# Patient Record
Sex: Male | Born: 1935 | Race: White | Hispanic: No | Smoking: Former smoker
Health system: Southern US, Community
[De-identification: ages and names within clinical notes are randomized; demographics above are authoritative.]

## PROBLEM LIST (undated history)

## (undated) DIAGNOSIS — E039 Hypothyroidism, unspecified: Secondary | ICD-10-CM

## (undated) DIAGNOSIS — I2510 Atherosclerotic heart disease of native coronary artery without angina pectoris: Secondary | ICD-10-CM

## (undated) DIAGNOSIS — I350 Nonrheumatic aortic (valve) stenosis: Secondary | ICD-10-CM

## (undated) DIAGNOSIS — E119 Type 2 diabetes mellitus without complications: Secondary | ICD-10-CM

## (undated) DIAGNOSIS — R21 Rash and other nonspecific skin eruption: Secondary | ICD-10-CM

## (undated) DIAGNOSIS — E785 Hyperlipidemia, unspecified: Secondary | ICD-10-CM

## (undated) DIAGNOSIS — E079 Disorder of thyroid, unspecified: Secondary | ICD-10-CM

## (undated) HISTORY — DX: Atherosclerotic heart disease of native coronary artery without angina pectoris: I25.10

## (undated) HISTORY — PX: EYE SURGERY: SHX253

## (undated) HISTORY — DX: Hyperlipidemia, unspecified: E78.5

## (undated) HISTORY — DX: Rash and other nonspecific skin eruption: R21

## (undated) HISTORY — PX: CARPAL TUNNEL RELEASE: SHX101

## (undated) HISTORY — DX: Disorder of thyroid, unspecified: E07.9

## (undated) HISTORY — PX: CARDIAC CATHETERIZATION: SHX172

## (undated) HISTORY — DX: Nonrheumatic aortic (valve) stenosis: I35.0

## (undated) HISTORY — PX: APPENDECTOMY: SHX54

## (undated) HISTORY — PX: TONSILLECTOMY: SUR1361

---
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
## 2001-07-29 ENCOUNTER — Ambulatory Visit (HOSPITAL_COMMUNITY): Admission: RE | Admit: 2001-07-29 | Discharge: 2001-07-29 | Payer: Self-pay | Admitting: Gastroenterology

## 2002-03-02 ENCOUNTER — Ambulatory Visit (HOSPITAL_BASED_OUTPATIENT_CLINIC_OR_DEPARTMENT_OTHER): Admission: RE | Admit: 2002-03-02 | Discharge: 2002-03-02 | Payer: Self-pay | Admitting: Orthopedic Surgery

## 2003-04-20 ENCOUNTER — Encounter: Admission: RE | Admit: 2003-04-20 | Discharge: 2003-07-19 | Payer: Self-pay | Admitting: Family Medicine

## 2006-04-02 ENCOUNTER — Inpatient Hospital Stay (HOSPITAL_COMMUNITY): Admission: EM | Admit: 2006-04-02 | Discharge: 2006-04-04 | Payer: Self-pay | Admitting: Emergency Medicine

## 2007-01-15 ENCOUNTER — Ambulatory Visit: Payer: Self-pay | Admitting: Vascular Surgery

## 2009-01-06 ENCOUNTER — Encounter: Admission: RE | Admit: 2009-01-06 | Discharge: 2009-01-06 | Payer: Self-pay | Admitting: Orthopedic Surgery

## 2009-01-06 ENCOUNTER — Encounter: Admission: RE | Admit: 2009-01-06 | Discharge: 2009-01-06 | Payer: Self-pay | Admitting: Family Medicine

## 2009-01-31 ENCOUNTER — Ambulatory Visit: Payer: Self-pay | Admitting: Vascular Surgery

## 2011-01-18 ENCOUNTER — Emergency Department (HOSPITAL_COMMUNITY): Payer: Medicare Other

## 2011-01-18 ENCOUNTER — Inpatient Hospital Stay (HOSPITAL_COMMUNITY)
Admission: EM | Admit: 2011-01-18 | Discharge: 2011-01-22 | DRG: 247 | Disposition: A | Discharge status: Home / Self Care | Payer: Medicare Other | Attending: Cardiovascular Disease | Admitting: Cardiovascular Disease

## 2011-01-18 DIAGNOSIS — I10 Essential (primary) hypertension: Secondary | ICD-10-CM | POA: Diagnosis present

## 2011-01-18 DIAGNOSIS — Z7902 Long term (current) use of antithrombotics/antiplatelets: Secondary | ICD-10-CM

## 2011-01-18 DIAGNOSIS — I252 Old myocardial infarction: Secondary | ICD-10-CM

## 2011-01-18 DIAGNOSIS — I200 Unstable angina: Secondary | ICD-10-CM | POA: Diagnosis present

## 2011-01-18 DIAGNOSIS — Z87891 Personal history of nicotine dependence: Secondary | ICD-10-CM

## 2011-01-18 DIAGNOSIS — E785 Hyperlipidemia, unspecified: Secondary | ICD-10-CM | POA: Diagnosis present

## 2011-01-18 DIAGNOSIS — I739 Peripheral vascular disease, unspecified: Secondary | ICD-10-CM | POA: Diagnosis present

## 2011-01-18 DIAGNOSIS — Z7982 Long term (current) use of aspirin: Secondary | ICD-10-CM

## 2011-01-18 DIAGNOSIS — I2510 Atherosclerotic heart disease of native coronary artery without angina pectoris: Principal | ICD-10-CM | POA: Diagnosis present

## 2011-01-18 LAB — DIFFERENTIAL
Basophils Absolute: 0 10*3/uL (ref 0.0–0.1)
Eosinophils Relative: 1 % (ref 0–5)
Lymphocytes Relative: 28 % (ref 12–46)
Lymphs Abs: 2 10*3/uL (ref 0.7–4.0)
Neutro Abs: 4.4 10*3/uL (ref 1.7–7.7)

## 2011-01-18 LAB — CBC
HCT: 42.6 % (ref 39.0–52.0)
Hemoglobin: 14.8 g/dL (ref 13.0–17.0)
MCV: 90.3 fL (ref 78.0–100.0)
RBC: 4.72 MIL/uL (ref 4.22–5.81)
RDW: 13 % (ref 11.5–15.5)
WBC: 7.3 10*3/uL (ref 4.0–10.5)

## 2011-01-18 LAB — POCT I-STAT, CHEM 8
Chloride: 107 mEq/L (ref 96–112)
Glucose, Bld: 118 mg/dL — ABNORMAL HIGH (ref 70–99)
HCT: 44 % (ref 39.0–52.0)
Potassium: 3.7 mEq/L (ref 3.5–5.1)
Sodium: 139 mEq/L (ref 135–145)

## 2011-01-18 LAB — POCT CARDIAC MARKERS
CKMB, poc: 2.5 ng/mL (ref 1.0–8.0)
Myoglobin, poc: 177 ng/mL (ref 12–200)
Troponin i, poc: <0.05 ng/mL (ref 0.00–0.09)

## 2011-01-18 LAB — PROTIME-INR
INR: 1 (ref 0.00–1.49)
Prothrombin Time: 13.4 seconds (ref 11.6–15.2)

## 2011-01-18 LAB — APTT: aPTT: 33 seconds (ref 24–37)

## 2011-01-19 DIAGNOSIS — I200 Unstable angina: Secondary | ICD-10-CM

## 2011-01-19 LAB — CBC
HCT: 43.7 % (ref 39.0–52.0)
Hemoglobin: 14.9 g/dL (ref 13.0–17.0)
MCH: 30.9 pg (ref 26.0–34.0)
MCV: 90.7 fL (ref 78.0–100.0)
RBC: 4.82 MIL/uL (ref 4.22–5.81)
WBC: 6.1 10*3/uL (ref 4.0–10.5)

## 2011-01-19 LAB — HEPARIN LEVEL (UNFRACTIONATED): Heparin Unfractionated: 0.26 IU/mL — ABNORMAL LOW (ref 0.30–0.70)

## 2011-01-19 LAB — CARDIAC PANEL(CRET KIN+CKTOT+MB+TROPI)
CK, MB: 4.8 ng/mL — ABNORMAL HIGH (ref 0.3–4.0)
CK, MB: 3.6 ng/mL (ref 0.3–4.0)
Total CK: 77 U/L (ref 7–232)
Troponin I: 0.02 ng/mL (ref 0.00–0.06)

## 2011-01-19 LAB — LIPID PANEL
LDL Cholesterol: 77 mg/dL (ref 0–99)
Total CHOL/HDL Ratio: 3.8 RATIO
Triglycerides: 81 mg/dL (ref <150)
VLDL: 16 mg/dL (ref 0–40)

## 2011-01-19 LAB — COMPREHENSIVE METABOLIC PANEL
AST: 19 U/L (ref 0–37)
Albumin: 3.8 g/dL (ref 3.5–5.2)
Alkaline Phosphatase: 44 U/L (ref 39–117)
BUN: 19 mg/dL (ref 6–23)
Chloride: 107 mEq/L (ref 96–112)
GFR calc Af Amer: 48 mL/min — ABNORMAL LOW (ref >60)
Potassium: 3.8 mEq/L (ref 3.5–5.1)
Total Bilirubin: 0.5 mg/dL (ref 0.3–1.2)
Total Protein: 6.3 g/dL (ref 6.0–8.3)

## 2011-01-19 LAB — HEMOGLOBIN A1C: Hgb A1c MFr Bld: 5.8 % — ABNORMAL HIGH (ref <5.7)

## 2011-01-20 LAB — CBC
HCT: 45 % (ref 39.0–52.0)
Hemoglobin: 15.5 g/dL (ref 13.0–17.0)
RBC: 4.92 MIL/uL (ref 4.22–5.81)

## 2011-01-20 LAB — HEPARIN LEVEL (UNFRACTIONATED): Heparin Unfractionated: 0.71 IU/mL — ABNORMAL HIGH (ref 0.30–0.70)

## 2011-01-21 DIAGNOSIS — I2510 Atherosclerotic heart disease of native coronary artery without angina pectoris: Secondary | ICD-10-CM

## 2011-01-21 LAB — CBC
HCT: 44 % (ref 39.0–52.0)
Hemoglobin: 15.3 g/dL (ref 13.0–17.0)
MCV: 92.2 fL (ref 78.0–100.0)
RBC: 4.77 MIL/uL (ref 4.22–5.81)
WBC: 6.5 10*3/uL (ref 4.0–10.5)

## 2011-01-21 LAB — BASIC METABOLIC PANEL
CO2: 22 mEq/L (ref 19–32)
Calcium: 9 mg/dL (ref 8.4–10.5)
Chloride: 110 mEq/L (ref 96–112)
GFR calc Af Amer: 57 mL/min — ABNORMAL LOW (ref >60)
Glucose, Bld: 100 mg/dL — ABNORMAL HIGH (ref 70–99)
Potassium: 4 mEq/L (ref 3.5–5.1)
Sodium: 137 mEq/L (ref 135–145)

## 2011-01-21 LAB — POCT ACTIVATED CLOTTING TIME: Activated Clotting Time: 505 seconds

## 2011-01-21 LAB — GLUCOSE, CAPILLARY: Glucose-Capillary: 82 mg/dL (ref 70–99)

## 2011-01-22 LAB — BASIC METABOLIC PANEL
CO2: 25 mEq/L (ref 19–32)
Calcium: 9.1 mg/dL (ref 8.4–10.5)
Chloride: 109 mEq/L (ref 96–112)
Creatinine, Ser: 1.55 mg/dL — ABNORMAL HIGH (ref 0.4–1.5)
Glucose, Bld: 103 mg/dL — ABNORMAL HIGH (ref 70–99)

## 2011-01-22 LAB — CBC
HCT: 43.2 % (ref 39.0–52.0)
MCH: 32.3 pg (ref 26.0–34.0)
MCHC: 35.4 g/dL (ref 30.0–36.0)
MCV: 91.1 fL (ref 78.0–100.0)
RDW: 13.1 % (ref 11.5–15.5)

## 2011-01-22 NOTE — Procedures (Signed)
[name], [name]                ACCOUNT NO.:  [id number]  MEDICAL RECORD NO.:  [id number]           PATIENT TYPE:  I  LOCATION:  6533                         FACILITY:  [facility]  PHYSICIAN:  Verne Carrow, MDDATE OF BIRTH:  1930-09-13  DATE OF PROCEDURE:  01/21/2011 DATE OF DISCHARGE:                           CARDIAC CATHETERIZATION   PRIMARY CARDIOLOGIST:  [name], MD  PRIMARY CARE PHYSICIAN:  [name], MD  PROCEDURE PERFORMED:  PTCA with placement of two overlapping drug- eluting stents in the proximal and mid right coronary artery.  OPERATOR:  Verne Carrow, MD  INDICATION:  This is a 77 year old Caucasian male with a history of previous tobacco use, known coronary artery disease, peripheral vascular disease, and hyperlipidemia who was admitted to the hospital with complaints of chest discomfort.  His diagnostic catheterization was performed by Dr. [name], earlier today and the patient was found to have two severe stenoses in the proximal and mid right coronary artery.  I reviewed the films with Dr. Elease Hashimoto and we agreed to proceed to coronary intervention.  PROCEDURE IN DETAIL:  When I entered the case, the patient was lying supine on the cath table.  There was a 6-French sheath present in the right femoral artery.  The patient was given a bolus of Angiomax and drip was started.  The patient was given an additional 300 mg of Plavix on the cath table.  He does use Plavix on a daily basis.  When the ACT was greater than 200, I selectively engaged the native right coronary artery with a 6-French JR-4 guiding catheter with side holes.  A Cougar intracoronary wire was easily advanced down the right coronary artery. A 2.5 x 12-mm balloon was positioned in the area of tightest stenosis in the mid vessel and deployed without difficulty.  Despite the fact there was some calcification present, the balloon expanded well.  We then attempted to pass  a stent through this area and the mid vessel, however, the stent would not pass the proximal lesion.  A 2.5 x 12-mm balloon was positioned inside the proximal lesion and was inflated without difficulty.  There was once again good expansion of the balloon.  There was diffuse calcification throughout the entire proximal and mid vessel. At this point, we were still unable to pass the stent to the mid vessel. I placed a second Cougar wire into the distal right coronary artery. After much difficulty, we decided to once again dilate the area with a larger balloon.  A 3.0 x 20-mm noncompliant balloon was inflated twice in mid and proximal segment.  At this time, we were able to pass a 3.5 x 16-mm Promus Element drug-eluting stent to the mid vessel.  This was deployed without difficulty with good expansion.  A 3.5 x 16-mm Promus Element drug-eluting stent was then advanced over the wire into the proximal portion of the mid segment in an overlapping fashion with the first stent and was deployed without difficulty.  A 3.75 x 20-mm noncompliant balloon was positioned inside the stented segment and inflated twice in overlapping segments up to 16 atmospheres.  There was  a good balloon expansion.  The stenosis was taken from 90% in both the mid vessel and the proximal portion of the mid vessel.  Final stenosis was 0%.  There was excellent flow into the distal vessel.  There was some haziness along the edges of the stent secondary to the calcification that was present.  There were no immediate complications. The patient was taken to the recovery room in stable condition.  IMPRESSION:  Successful percutaneous transluminal coronary angioplasty with placement of overlapping drug-eluting stents in the mid right coronary artery.  RECOMMENDATIONS:  The patient will be continued on aspirin and Plavix for at least 1 year.  We will also continue his beta-blocker and statin.     Verne Carrow,  MD     CM/MEDQ  D:  01/21/2011  T:  01/22/2011  Job:  [id number]  cc:   [name], M.D. [name], M.D.  Electronically Signed by Verne Carrow MD on 01/22/2011 10:09:32 AM

## 2011-01-30 NOTE — Discharge Summary (Signed)
[name], [name]                ACCOUNT NO.:  [id number]  MEDICAL RECORD NO.:  [id number]           PATIENT TYPE:  I  LOCATION:  6533                         FACILITY:  [facility]  PHYSICIAN:  Verne Carrow, MDDATE OF BIRTH:  [birth year]  DATE OF ADMISSION:  01/18/2011 DATE OF DISCHARGE:  01/22/2011                              DISCHARGE SUMMARY   PRIMARY CARDIOLOGIST:  [name], MD  PRIMARY CARE PROVIDER:  [name], MD  DISCHARGE DIAGNOSES: 1. Coronary artery disease, status post percutaneous transluminal     coronary angioplasty and placement of 2 overlapping drug-eluting     stents in the proximal and mid right coronary artery on January 21, 2011.     a.     Status post inferolateral myocardial infarction diagnosed by      Cardiolite in 2007.     b.     Cardiac catheterization, February 01, 2006:  With a totally      occluded ramus intermediate with left to left collaterals.  There      was diffuse coronary artery disease involving the right coronary      artery.  Medical therapy was recommended.  Ejection fraction of      55%. 2. Hypertension, Norvasc added this admission. 3. Hyperlipidemia/hypertriglyceridemia.  SECONDARY DIAGNOSES: 1. Peripheral vascular disease/carotid artery disease with     approximately 50% right internal carotid artery stenosis, evaluated     by Dr. Hart Rochester. 2. Questionable history of transient ischemic attacks. 3. Remote tobacco use, quit in 1968.  ALLERGIES:  No known drug allergies.  PROCEDURE/DIAGNOSTICS PERFORMED DURING HOSPITALIZATION: 1. Left heart catheterization with coronary angiography.     a.     Left anterior descending artery with 40% stenosis at its      origin.  Mild to moderate disease elsewhere.  Left circumflex      artery with a 40-50% mid stenosis.  First obtuse marginal artery      with moderate stenosis.  Ramus intermediate vessel with moderate      to severe disease proximally and occluded distally.  It  fills via      left-to-left collaterals.  Unchanged from prior cath in 2007.      Right coronary artery with moderate calcification.  Proximal 30%      eccentric stenosis.  Mid vessel with 70-80% stenosis (possibly 80-      90% depending on the view).  Posterior descending artery and      posterolateral segment with minor luminal irregularities.  Normal      left ventricular systolic function.     b.     Status post percutaneous transluminal coronary angioplasty      with placement of 2 Promus Element drug-eluting stents in the      proximal mid right coronary artery.     c.     A 3.5 x 16 mm Promus Element stent in the mid right coronary      artery.     d.     A 3.5 x 16 Promus Element drug-eluting stent in the proximal  right coronary artery.     e.     Both lesions, stenosis taking from 90% to 0% stenosis. 2. Chest x-ray on January 18, 2011:  No active cardiopulmonary disease.  REASON FOR HOSPITALIZATION:  This is a 77 year old gentleman with history of coronary artery disease who has been doing well on medical therapy prior to admission on January 18, 2011.  At this time, the patient was preparing for yard work when he noted substernal chest heaviness without associated symptoms.  After 30 minutes of rest, the pain relieved.  The patient was back outside and had recurrence of his symptoms and therefore took a nitroglycerin and he had no relief.  His wife called EMS and the patient chewed a baby aspirin and his pain has relieved by the time EMS had arrived.  In the emergency department, the patient was pain free.  His EKG showed no acute changes.  Point-of-care markers were negative x1.  The patient was admitted to the Cardiology Service for further evaluation.  HOSPITAL COURSE:  The patient was admitted to telemetry and cardiac enzymes were cycled.  The patient's troponin remained within normal limits, and his CK-MB did elevate to 7.  The patient was continued on aspirin, Plavix  and heparin and statin were added.  No beta-blocker was used secondary to bradycardia at baseline.  The patient's initial labs also showed a creatinine elevated at 1.9 and therefore the patient was hydrated and kept through the weekend for planned cardiac catheterization on Monday morning.  The patient had no further complaints of chest pain during hospitalization.  He was noted to be bradycardic with heart rate falling to 39, but this was nonsustained, the patient was asymptomatic.  After hydration, the patient's creatinine was 1.45 and therefore he was able to proceed with cardiac catheterization on January 21, 2011.  The patient was brought to the Cardiac Cath Lab by Dr. Elease Hashimoto, informed consent was obtained.  As above, there was notable stenosis in the right coronary artery, the mid vessel had 70-80% stenosis and possibly 80-90% depending on the view, therefore the case was discussed with Dr. Clifton James and he agreed to pursue with PCI of the right coronary artery.  Dr. Clifton James been completed PTCA and placement of 2 Promus overlapping drug-eluting stents in the proximal and mid right coronary artery.  The patient tolerated the procedure well and was taken for overnight observation.  It was noted the patient should to be continued on aspirin and Plavix for at least 1 year.  He will also be continued on a statin.  No beta-blocker at this time secondary to bradycardia.  The patient tolerated the catheterization well and his right groin was without signs of hematoma. He did ambulate with cardiac rehab.  He was able to complete 400 feet with steady gait and no chest pain.  The patient declined outpatient cardiac rehab at this time and states he will continue to exercise on his own, he does do split squats approximately 3 times a week.  Of note, the patient was noted to be hypertensive with systolic pressures of 155, therefore he was started on Norvasc 5 mg daily, this will be continued as an  outpatient.  On the day of discharge, Dr. Clifton James any evaluated the patient and noted him stable for home.  Again, he was able to ambulate without difficulty and had no further complaints of chest pain or shortness of breath.  The patient's creatinine was monitored and was 1.55 on the day of discharge, this  is postcatheterization and hydration.  DISCHARGE LABORATORY DATA:  WBC 8.1, hemoglobin 15.3, hematocrit 43.2, and platelet 215.  Sodium 139, potassium 4.3, BUN 14, and creatinine 1.55.  Hemoglobin A1c 5.8, cholesterol 126, triglycerides 81, HDL 33, and LDL 77.  TSH 3.036.  DISCHARGE MEDICATIONS: 1. Norvasc 5 mg daily. 2. Nitroglycerin 0.4 mg sublingual 1 tablet under tongue after onset     chest pain and may repeat after 5 minutes for 3 doses. 3. Pravastatin 40 mg daily. 4. Aspirin 81 mg daily. 5. Fenofibrate 145 mg daily. 6. Fish oil 1000 mg twice daily. 7. Glucosamine over-the-counter daily. 8. Plavix 75 mg daily. 9. Synthroid 25 mcg daily. 10.Vitamin B1 over-the-counter 1 tablet daily.  FOLLOWUP PLANS AND INSTRUCTIONS: 1. The patient will follow up with Dr. Elease Hashimoto on February 12, 2011, at 9     a.m. 2. The patient is to increase activity slowly.  He may shower but no     bathing.  No lifting for 1 week greater than 5 pounds.  No driving     for 2 days.  No sexual activity for 1 week.  He is to keep his cath     site clean and dry and call the office for any problems. 3. The patient is to avoid any straining.  He is to stop any     activities that causes chest pain or shortness of breath. 4. The patient is to continue a low-sodium, heart-healthy diet. 5. The patient is to call the office in the interim for any problems     or concerns.     Leonette Monarch, PA-C   ______________________________ Verne Carrow, MD    NB/MEDQ  D:  01/22/2011  T:  01/23/2011  Job:  [id number]  cc:   [name], M.D. [name], M.D.  Electronically Signed by Alen Blew P.A. on 01/29/2011 01:20:05 PM Electronically Signed by Verne Carrow MD on 01/30/2011 08:37:02 AM

## 2011-02-11 ENCOUNTER — Encounter: Payer: Self-pay | Admitting: *Deleted

## 2011-02-12 ENCOUNTER — Ambulatory Visit (INDEPENDENT_AMBULATORY_CARE_PROVIDER_SITE_OTHER): Payer: Medicare Other | Admitting: Cardiovascular Disease

## 2011-02-12 ENCOUNTER — Encounter: Payer: Self-pay | Admitting: Cardiovascular Disease

## 2011-02-12 VITALS — BP 158/80 | HR 56 | Ht 68.0 in | Wt 175.8 lb

## 2011-02-12 DIAGNOSIS — I10 Essential (primary) hypertension: Secondary | ICD-10-CM | POA: Insufficient documentation

## 2011-02-12 DIAGNOSIS — I2510 Atherosclerotic heart disease of native coronary artery without angina pectoris: Secondary | ICD-10-CM | POA: Insufficient documentation

## 2011-02-12 DIAGNOSIS — E785 Hyperlipidemia, unspecified: Secondary | ICD-10-CM

## 2011-02-12 NOTE — Assessment & Plan Note (Signed)
[name] is doing very well. He has not had episodes of chest pain. He's tolerating his meds and without complications. He will continue with his same medications and I'll see him again in 6 months.

## 2011-02-12 NOTE — Procedures (Signed)
  NAMEVAYDEN, [name]                ACCOUNT NO.:  [id number]  MEDICAL RECORD NO.:  [id number]           PATIENT TYPE:  I  LOCATION:  6533                         FACILITY:  [facility]  PHYSICIAN:  [name], M.D. DATE OF BIRTH:  1930/09/01  DATE OF PROCEDURE:  01/21/2011 DATE OF DISCHARGE:                           CARDIAC CATHETERIZATION   [name] is a 77 year old gentleman with a history of coronary artery disease in the past.  He has done fairly well on medical therapy since being diagnosed with coronary artery disease.  He had episodes of chest pain last Friday and was admitted to the hospital.  He is ruled out for myocardial infarction.  He is referred for heart catheterization for further evaluation of his chest pain today.  The procedure was left heart catheterization with coronary angiography. The right femoral artery was easily cannulated using the modified Seldinger technique.  HEMODYNAMICS:  LV pressure is 142/18.  His aortic pressure is 140/56.  ANGIOGRAPHY:  Left main:  The left main is moderately calcified.  There is a distal stenosis of 20-30%.  The left anterior descending artery:  The LAD has a 40% stenosis at its origin.  It is moderately calcified.  There is mild-to-moderate disease elsewhere.  There was a large diagonal branch, which has mild-to- moderate irregularities.  The left circumflex artery:  The left circumflex artery has minor luminal irregularities and then a 40-50% mid stenosis.  First obtuse marginal artery has moderate stenosis.  The ramus intermediate vessel has moderate to severe disease proximally and is then occluded distally.  It fills via left-to-left collaterals. This lesion is unchanged from his previous cath in 2007.  The right coronary artery:  The right coronary artery is moderately calcified throughout its course.  There is a proximal 30% eccentric stenosis.  The mid vessel has a 70-80% stenosis.  In fact, it could  be 80-90% depending on the view.  The distal right coronary artery has minor luminal irregularities.  The posterior descending artery and the posterior lateral segment artery have minor luminal irregularities.  The left ventriculogram was performed in the 30 RAO position.  This reveals normal left ventricular systolic function.  I have discussed the case with Dr. Clifton James.  We will proceed with PCI of the right coronary artery.     [name], M.D.     PJN/MEDQ  D:  01/21/2011  T:  01/22/2011  Job:  [id number]  cc:   [name], M.D.  Electronically Signed by [name] M.D. on 02/12/2011 09:08:17 AM

## 2011-02-12 NOTE — Assessment & Plan Note (Signed)
His blood pressure is well controlled. He'll continue on the amlodipine.

## 2011-02-12 NOTE — Progress Notes (Signed)
History of Present Illness:  [name] is an elderly gentleman with a history of coronary artery disease. He status post recent hospitalization for angina. He had PTCA and stenting of his mid right coronary artery. He has a known occlusion of a small ramus intermediate branch.  He's done well. He's not had any episodes of chest pain or shortness of breath.  Current Outpatient Prescriptions on File Prior to Visit  Medication Sig Dispense Refill  . aspirin 81 MG tablet Take 81 mg by mouth daily.        . clopidogrel (PLAVIX) 75 MG tablet Take 75 mg by mouth daily.        . fenofibrate (TRICOR) 145 MG tablet Take 145 mg by mouth daily.        . fish oil-omega-3 fatty acids 1000 MG capsule Take 2 g by mouth daily.        Marland Kitchen glucosamine-chondroitin 500-400 MG tablet Take 1 tablet by mouth 2 (two) times daily.        Marland Kitchen levothyroxine (SYNTHROID, LEVOTHROID) 50 MCG tablet Take 50 mcg by mouth daily.        . Multiple Vitamin (MULTIVITAMIN) tablet Take 1 tablet by mouth daily.        . niacin 500 MG tablet Take 500 mg by mouth daily.       . pravastatin (PRAVACHOL) 40 MG tablet Take 40 mg by mouth daily.        . sildenafil (VIAGRA) 25 MG tablet Take 25 mg by mouth daily as needed.        . Flaxseed, Linseed, (FLAX SEED OIL PO) Take by mouth.        Amlodipine 5 mg a day  No Known Allergies  Past Medical History  Diagnosis Date  . Coronary artery disease   . Hyperlipidemia   . Thyroid disease     hypothyroidism    Past Surgical History  Procedure Date  . Cardiac catheterization     2007 occluded ramus inter.   . Carpal tunnel release   . Appendectomy     History  Smoking status  . Former Smoker  . Quit date: 11/04/1960  Smokeless tobacco  . Not on file    History  Alcohol Use No    Family History  Problem Relation Age of Onset  . Hypertension Mother   . Hypertension Father     Reviw of Systems:  The patient denies any heat or cold intolerance.  No weight gain or weight  loss.  The patient denies headaches or blurry vision.  There is no cough or sputum production.  The patient denies dizziness.  There is no hematuria or hematochezia.  The patient denies any muscle aches or arthritis.  The patient denies any rash.  The patient denies frequent falling or instability.  There is no history of depression or anxiety.  All other systems were reviewed and are negative.  Physical Exam: BP 158/80  Pulse 56  Ht 5\' 8"  (1.727 m)  Wt 175 lb 12.8 oz (79.742 kg)  BMI 26.73 kg/m2 The patient is alert and oriented x 3.  The mood and affect are normal.  The skin is warm and dry.  Color is normal.  The HEENT exam reveals that the sclera are nonicteric.  The mucous membranes are moist.  The carotids are 2+ without bruits.  There is no thyromegaly.  There is no JVD.  The lungs are clear.  The chest wall is non tender.  The heart  exam reveals a regular rate with a normal S1 and S2.  There are no murmurs, gallops, or rubs.  The PMI is not displaced.   Abdominal exam reveals good bowel sounds.  There is no guarding or rebound.  There is no hepatosplenomegaly or tenderness.  There are no masses.  Exam of the legs reveal no clubbing, cyanosis, or edema.  The legs are without rashes.  The distal pulses are intact.  Cranial nerves II - XII are intact.  Motor and sensory functions are intact.  The gait is normal.  Assessment / Plan:

## 2011-03-01 NOTE — H&P (Signed)
[name], [name]                ACCOUNT NO.:  [id number]  MEDICAL RECORD NO.:  [id number]           PATIENT TYPE:  I  LOCATION:  3711                         FACILITY:  [facility]  PHYSICIAN:  Gregory Rotunda, MD, FACCDATE OF BIRTH:  1929-11-23  DATE OF ADMISSION:  01/18/2011 DATE OF DISCHARGE:                             HISTORY & PHYSICAL   PRIMARY CARDIOLOGIST:  Gregory Mixer, MD  PRIMARY CARE PROVIDER:  [name], MD  PATIENT PROFILE:  [name] with prior history of CAD who presents with unstable angina.  PROBLEMS: 1. Unstable angina/CAD.     a.     Status post inferolateral MI diagnosed by Cardiolite in      2007.     b.     Apr 03, 2006, cardiac catheterization, left main was a 2.5      mm vessel.  LAD had 10-20% luminal irregularities.  First diagonal      had 20% ostial stenosis and 50% mid stenosis.  Ramus intermedius      was a 2 mm vessel and was totally occluded in the midsection of      the artery with left-to-left collaterals.     c.     Right coronary artery had a 50% proximal stenosis and 60-70%      stenosis in the mid vessel.  EF was 55% with lateral akinesis.      Medical therapy was recommended. 2. Peripheral vascular disease/carotid arterial disease with     approximately 50% right internal carotid artery stenosis,     previously seen by Dr. Hart Daugherty. 3. Questionable history of TIAs. 4. Hyperlipidemia/hypertriglyceridemia. 5. Remote tobacco use, quitting in 1968.  ALLERGIES:  No known drug allergies.  HISTORY OF PRESENT ILLNESS:  [name] with history of inferolateral MI diagnosed by Cardiolite, following an episode of dyspnea in 2007.  He subsequently underwent a cardiac catheterization revealing a totally occluded ramus intermedius, which was a small vessel with left-to-left collaterals.  He also had moderate stenosis in the RCA.  Medical therapy was recommended.  Over the past 5 years, the patient has done exceptionally  well.  He does a spin class for an hour 3 times per week without any current symptoms or limitations.  He actually did 1-day classes this morning.  The patient then ran some errands with his wife and when he returned home, he was getting ready to use a weed whacker when he noted 4/10 substernal chest heaviness without associated symptoms.  He went back inside and sat down for about 30 minutes with some relief and then went back outside to give another shot, but then he had recurrent symptoms, prompting him to come back inside and rest.  At that point, as he took a nitroglycerin which he says was at least a year old and he had no relief.  His wife called EMS and they were advised over the phone to chew a baby aspirin.  He did this with complete relief of his symptoms prior to EMS arrival.  He was then taken to the ED for evaluation.  He is currently painfree.  ECG shows no acute changes. Point-of-care markers are negative.  Total duration of symptoms was about 45 minutes to an hour.  HOLD MEDICATIONS:  Fish oil 2 tablets daily, niacin 500 mg nightly, glucosamine 2 tablets daily, aspirin 81 mg daily, Plavix 75 mg daily, Synthroid 50 mcg daily, TriCor 145 mg daily, multivitamin daily, Pravachol 40 mg daily.  FAMILY HISTORY:  Mother died at 48 of old age.  Father died at 52 with a history coronary artery disease, stroke, and diabetes.  Sister died with a history of diabetes and coronary artery disease.  SOCIAL HISTORY:  The patient lives in [place] with his wife.  He is retired from [employer].  He previously smoked but quit in 1968. Denies alcohol or drugs.  He is exercising at least 3 times a week for an hour fairly heavily exertional activities without symptoms or limitations.  REVIEW OF SYSTEMS:  Positive for chest heaviness as outlined above. Otherwise all systems reviewed and negative.  He is a full code.  PHYSICAL EXAMINATION:  VITAL SIGNS:  He is afebrile, heart rate is  52, respirations 16, blood pressure is 132/78, pulse ox 98% on 2 L. GENERAL:  A pleasant white male in no acute distress.  Awake, alert, and oriented x3.  He has a normal affect. HEENT:  Normal. NEURO:  Grossly intact and nonfocal. SKIN:  Warm and dry without lesions or Masses. NECK:  Supple without bruits or JVD. LUNGS:  Respirations are regular and unlabored.  Clear to auscultation. CARDIAC:  Regular S1 and S2.  No S3, S4, or murmurs. ABDOMEN:  Round, soft, and nontender.  Bowel sounds present x4. EXTREMITIES:  Warm, dry, and pink.  No clubbing, cyanosis, or edema. Dorsalis pedis and posterior tibial pulses 2+ and equal bilaterally.  Chest x-ray is pending.  EKG shows sinus bradycardia, rate of 53, normal axis, right bundle-branch block.  No acute ST-T changes.  Hemoglobin 15.0, hematocrit 44.0, WBC 7.3, platelets 225.  Sodium 139, potassium 3.7, chloride 107, BUN 22, creatinine 1.9, glucose 118.  CK-MB 2.9, troponin I less than 0.05.  ASSESSMENT AND PLAN: 1. Unstable angina.  The patient presents with symptoms concerning for     angina.  Plan to admit and cycle enzymes.  Continue aspirin, add     heparin, and continue Plavix and statin.  Hold off on beta-blocker     with baseline bradycardia.  The patient's creatinine is elevated by     point-of-care at 1.9.  This was previously 1.3 ini May 2007.  We do     not have records with regards to status in between then and now.     We will hydrate today and plan for cardiac catheterization on     Monday.  The patient is agreeable. 2. Hyperlipidemia.  Check lipids and LFTs.  Continue statin therapy.     [name], ANP   ______________________________ Gregory Rotunda, MD, [facility]    CB/MEDQ  D:  01/18/2011  T:  01/19/2011  Job:  [id number]  Electronically Signed by [name] ANP on 01/21/2011 12:08:32 PM Electronically Signed by Gregory Rotunda MD [facility] on 03/01/2011 11:42:44 AM

## 2011-03-19 NOTE — Consult Note (Signed)
NEW PATIENT CONSULTATION   [name], [name]  DOB:  12/29/1929                                       01/31/2009  IONGE#:[id number]   The patient is a 77 year old male patient referred by Dr. [name]  for vascular evaluation of his carotid occlusive disease.  This patient  over the last few weeks has had some episodes of tingling of the left  side of his face and the left neck area as well as the left lateral  chest wall.  He has had no weakness or paralysis.  No visual symptoms,  no dizziness, no syncope and no garbled speech.  He never had these  symptoms previously and has not had any symptoms currently.  He has no  history of stroke or amaurosis fugax.   PAST MEDICAL HISTORY:  1. Non-insulin-dependent diabetes mellitus.  2. Coronary artery disease with previous myocardial infarction 2007      with cardiac cath performed, treated medically.  3. Negative for hypertension, hyperlipidemia, congestive heart      failure, COPD or stroke.   PAST SURGICAL HISTORY:  1. Appendectomy.  2. Surgical procedure on the right hand.   FAMILY HISTORY:  Is positive for stroke in his father, coronary artery  disease in his father and sister and diabetes in his father.   SOCIAL HISTORY:  He is married and has four children, retired  Advice worker.  Does not smoke cigarettes since 1968 and  does not use alcohol.   REVIEW OF SYSTEMS:  Very rarely or occasionally will have some slight  tightness in his chest.  He is very active and does not have dyspnea on  exertion.  He has urinary frequency, joint pain but no claudication or  neurologic symptoms.   ALLERGIES:  None known.   MEDICATIONS:  Please see health history form.  He is on Plavix and  aspirin.   PHYSICAL EXAM:  Blood pressure is 128/64, heart rate 64, respirations  14.  Generally he is a healthy-appearing male in no apparent stress,  alert and oriented x3.  Neck is supple, 3+ carotid pulses  palpable.  No  bruits are audible.  Neurologic exam is normal.  No palpable adenopathy  in the neck.  Chest clear to auscultation.  Cardiovascular exam reveals  a regular rhythm, no murmurs.  Abdomen soft, nontender with no masses.  He has 3+ femoral and popliteal pulses bilaterally with well-perfused  lower extremities.   Carotid duplex exam performed at [facility] on March 5 revealed what  appears to be mild carotid occlusive disease bilaterally right worse  than the left.  Stenosis on the right appears to be about 50%, left  being less.   I do not think his carotid disease is causing the symptomatology which  he has experienced.  I am not certain these are TIAs.  He will watch  these symptoms closely and if he develops more discrete motor symptoms  or visual symptoms he will be in touch with [place].  Otherwise, we will see  him in 1 year with a followup carotid duplex exam at that time.  I doubt  that any treatment will be necessary.  A carotid duplex exam in our  office 2 years ago was very similar to the one performed this year.   Quita Skye Hart [place], M.D.  Electronically Signed  JDL/MEDQ  D:  01/31/2009  T:  02/01/2009  Job:  [id number]   cc:   [name], M.D.

## 2011-03-22 NOTE — Discharge Summary (Signed)
[name], [name]                ACCOUNT NO.:  [id number]   MEDICAL RECORD NO.:  [id number]          PATIENT TYPE:  INP   LOCATION:  2012                         FACILITY:  [facility]   PHYSICIAN:  [name], M.D. DATE OF BIRTH:  06-25-1930   DATE OF ADMISSION:  04/02/2006  DATE OF DISCHARGE:  04/04/2006                                 DISCHARGE SUMMARY   DISCHARGE DIAGNOSES:  1.  Coronary artery disease with a recent inferolateral myocardial      infarction.  2.  Diffuse coronary artery disease involving the right coronary artery.   MEDICATIONS:  1.  Enteric-coated aspirin 81 mg a day.  2.  Plavix 75 mg a day.  3.  Crestor 5 mg a day.  4.  Tricor 145 mg a day.  5.  Nitroglycerin 0.4 mg sublingually as needed for chest pain.  6.  Lisinopril 5 mg a day.   DISPOSITION:  The patient will see Dr. Hart Daugherty 1-2 weeks.  He is to see Dr.  Jeannetta Daugherty as needed.  We will check a fasting lipid profile in the office.   HISTORY:  [name] is a 77 year old gentleman who was recently seen for  episodes of chest pain.  He had a stress Cardiolite study, which revealed  the presence of an inferolateral myocardial infarction.  He was scheduled  for an elective heart catheterization but started having some chest  discomfort later that night.  He was admitted to the hospital and then was  scheduled to have a cath the next day.  Please see dictated H&P for further  details.   HOSPITAL COURSE BY PROBLEMS:  1.  His admission CPKs were negative.  It was clear that the myocardial      infarction occurred about a week before his presentation.  He was found      to have an occluded ramus intermediate branch.  This vessel is fairly      small and is about 2 mm in diameter.  It is really not a good candidate      for angioplasty, based on the long lesion and the relatively small      diameter.  There are moderate irregularities in the first diagonal      vessel, mid left circumflex artery,  and a moderate  to severe stenosis      of the mid right coronary artery.  The mid RCA stenosis is approximately      60 and perhaps as much as 70%.  It does not appear to be flow limiting      at this time.  He also has a long eccentric 50% stenosis in the      proximal.  I do not think that angioplasty of all these areas would      necessarily be beneficial and would certainly increase the odds of      restenosis.  We will treat him with the above-noted medications.  His      heart rate is fairly slow, so I will not start him on a beta blocker at  this time.  He is hypothyroid, and we will begin him on replacement.  We      may be able to start him on some beta blocker following that.  We will      go ahead and start him on some ACE inhibitor for post-MI heart failure      prevention.  2.  Hypothyroidism.  The patient had a TSH on admission.  His TSH was 9.  We      will start him on Synthroid 0.025 mg a day.  He will follow up with Dr.      Jeannetta Daugherty or Dr. Hart Daugherty for this.  3.  Hyperlipidemia.  Crestor was added to his medical regimen.  We will get      a follow-up lipid profile as an outpatient.   All of his other medical problems are stable.           ______________________________  [name], M.D.     PJN/MEDQ  D:  04/04/2006  T:  04/04/2006  Job:  [id number]   cc:   [name], M.D.  Fax: [phone]

## 2011-03-22 NOTE — Cardiovascular Report (Signed)
NAMELEEROY, LOVINGS                ACCOUNT NO.:  [id number]   MEDICAL RECORD NO.:  [id number]          PATIENT TYPE:  INP   LOCATION:  2012                         FACILITY:  [facility]   PHYSICIAN:  [name], M.D. DATE OF BIRTH:  17-Nov-1929   DATE OF PROCEDURE:  04/03/2006  DATE OF DISCHARGE:                              CARDIAC CATHETERIZATION   HISTORY:  Mr. Dastrup is a 77 year old gentleman with a recent onset of chest  pain.  He has had some persistent episodes of chest discomfort.  He is  referred for heart catheterization for further evaluation.   PROCEDURE:  Left heart catheterization with coronary angiography.   The right femoral artery was easily cannulated using a modified Seldinger  technique.   HEMODYNAMICS:  LV pressure is 108/15 with an aortic pressure of 110/60.   Angiography, left main.  The left main is moderate-sized vessel.  There was  some ventricularization of the 6-French catheter with deep engagement.  The  left main appears to be around 2.5 mm in size in some views.   The left anterior descending artery is a fairly large vessel.  There is mild  to moderate irregularities throughout the LAD.  These range between 10 and  20%.  There is a first diagonal artery that is moderate in size.  There is a  20% stenosis at the origin and then a 50% stenosis at its midpoint.   The ramus intermediate branch is a moderate-sized vessel and is  approximately 2 mm in size.  It is occluded at its midpoint with very slow  and late filling.  It does fill from left-to-left collaterals.   The left circumflex artery is a moderate-sized vessel.  There is a 50% mid  stenosis.   The right coronary artery is large and dominant.  There was some dampening  of the waveform.  The right coronary artery appeared to be large enough, but  the damping of the waveform must have been due to some tortuosity.   There is a 50% proximal stenosis.  This was followed by a 60-70% mid  stenosis.  This lesion does not appear to be flow obstructed at this time.  The posterior descending artery and the posterolateral segment artery are  remarkable.   The left ventriculogram was performed in a 30 RAO, as well as the 60 LAO,  projection.  The RAO projection shows overall normal left ventricular  systolic function with an ejection fraction of around 55%.  The left lateral  view shows lateral akinesis.  There is no significant mitral regurgitation.   COMPLICATIONS:  None.   CONCLUSION:  Moderate diffuse coronary artery disease.  He has an occlusion  of a ramus intermediate branch.  This ramus branch is fairly small and  really is not a good candidate for stenting because of it is relatively  small size and the diffuse nature of the stenosis.  It would require a long  segment of 2 mm stents which would have a high likelihood of restenosis.   He has moderate disease involving the other vessels including the first  diagonal  vessel, the left circumflex artery and the proximal mid right  coronary artery.  The tightest of these is the mid right coronary lesion.  It appears to be 60 to perhaps as much as 70% stenotic.  This certainly  could be stented.  It is about between 2 and 2.5 mm in diameter.  We will  continue with aggressive medical therapy, given the recent studies after  getting medical therapy versus stenting of moderate lesions.  We will treat  him with anti-anginals and see how he does.  If he has continued episodes of  angina, then we will proceed with angioplasty, most likely of the mid right  stenosis.           ______________________________  [name], M.D.     PJN/MEDQ  D:  04/03/2006  T:  04/03/2006  Job:  [id number]   cc:   [name], M.D.  Fax: [phone]

## 2011-03-22 NOTE — Procedures (Signed)
[facility]  Patient:    [name], [name] Visit Number: [id number] MRN: [id number]          Service Type: Attending:  Verlin Grills, M.D. Dictated by:   Verlin Grills, M.D. Proc. Date: 07/29/01   CC:         Hadassah Pais. Jeannetta Nap, M.D.   Procedure Report  REFERRING PHYSICIAN:  Dr. Hadassah Pais. Jeannetta Nap.  PROCEDURE INDICATION:  [name] (date of birth -- [birth year]) is a 77 year old male who is undergoing diagnostic colonoscopy to evaluate Hemoccult-positive stool.  ENDOSCOPIST:  Verlin Grills, M.D.  PREMEDICATION:  Demerol 50 mg, Versed 5 mg.  ENDOSCOPE:  Olympus pediatric colonoscopy.  DESCRIPTION OF PROCEDURE:  After obtaining informed consent, Mr. Bell was placed in the left lateral decubitus position.  I administered intravenous Demerol and intravenous Versed to achieve conscious sedation for the procedure.  The patients blood pressure, oxygen saturation and cardiac rhythm were monitored throughout the procedure and documented in the medical record.  Anal inspection was normal.  Digital rectal exam revealed a non-nodular prostate.  The Olympus pediatric video colonoscope was introduced into the rectum and easily advanced to the cecum.  Colonic preparation for the exam today was excellent.  Rectum normal.  Sigmoid colon and descending colon:  At approximately 25 cm from the anal verge, a 1-mm sessile polyp was removed with the electrocautery snare.  I was unable to retrieve the polyp post polypectomy for pathological evaluation.  Splenic flexure normal.  Transverse colon normal.  Hepatic flexure normal.  Ascending colon normal.  Cecum and ileocecal valve normal.  ASSESSMENT:  From the distal sigmoid colon, a 1-mm sessile polyp was removed with the electrocautery snare but the polyp could not be retrieved for pathological evaluation.    RECOMMENDATIONS:  Repeat colonoscopy in five years. Dictated by:   Verlin Grills, M.D. Attending:  Verlin Grills, M.D. DD:  07/29/01 TD:  07/29/01 Job: [id number]

## 2011-03-22 NOTE — H&P (Signed)
NAMEWHIT, BRUNI                ACCOUNT NO.:  [id number]   MEDICAL RECORD NO.:  [id number]          PATIENT TYPE:  EMS   LOCATION:  MAJO                         FACILITY:  [facility]   PHYSICIAN:  [name], M.D. DATE OF BIRTH:  01-09-1930   DATE OF ADMISSION:  04/05/2006  DATE OF DISCHARGE:                                HISTORY & PHYSICAL   HISTORY OF PRESENT ILLNESS:  [name] is an elderly gentleman who was  recently seen for episodes of chest pain. He was seen today for a stress  Cardiolite study.  The Cardiolite was positive and revealed the presence of  a previous but recent myocardial infarction.  He is admitted for heart  catheterization.   Mr. Kohlmann stated that he started having chest pain about a week ago.  He has  had some intermittent twinges over the past week but overall not nearly as  bad as a week ago.  Not associated with any shortness of breath, edema,  syncope or presyncope.  He does have some neck and throat soreness.  He has  not had any PND or orthopnea.   The patient has felt a few twinges of chest discomfort but overall he has  not been having any significant pains in the past several days.   CURRENT MEDICATIONS:  Fish oil once a day, Niacin 100 mg twice a day,  glucosamine chondroitin once a day, aspirin 81 mg a day.   ALLERGIES:  None.   PAST MEDICAL HISTORY:  1.  Hypercholesterolemia.  2.  Diabetes mellitus.   PAST SURGICAL HISTORY:  Status post appendectomy and hand surgery.   FAMILY HISTORY:  His father died at age 30 due to a myocardial infarction.  His father also had congestive heart failure.  Mother died at age 106 due to  old age.   REVIEW OF SYSTEMS:  Review of systems was reviewed.  Essentially as noted in  HPI and is otherwise negative.  He denies any easy bruisability or GI bleed.   PHYSICAL EXAMINATION:  GENERAL:  On exam he is a elderly gentleman in no  acute distress.  He is alert and oriented x3. His mood and affect are  normal.  VITAL SIGNS:  His weight is 186. Blood pressure is 120/80, heart rate of 52.  HEENT:  Exam reveals both carotids  no bruits, no JVD, no thyromegaly.  LUNGS:  Clear to auscultation.  HEART:  Regular rate S1-S2, no murmurs.  ABDOMEN:  Reveals good bowel sounds and is nontender.  EXTREMITIES:  He has no clubbing, cyanosis or edema.  NEURO EXAM:  Nonfocal.   A Stress Cardiolite study reveals presence of an inferolateral basilar  myocardial infarction consistent with  most likely an obtuse marginal or a  low diagonal occlusion.   I have recommended that we admit him for heart catheterization.  Will  schedule the heart cath on Friday.  I have called in a prescription for  nitroglycerin.  I have asked him to call me right away if he has any  worsening problems.  The Cardiolite study does not show significant  amount  of peri-infarct ischemia so I do not think that he needs be admitted to the  hospital.  We ordered a troponin level which later returned (after he had  left the office) at 0.51.  I called the patient and confirmed that he was  doing okay.  We will keep the cath scheduled for this coming Friday.  All of  his other medical problems stable.           ______________________________  [name], M.D.     PJN/MEDQ  D:  04/02/2006  T:  04/02/2006  Job:  [id number]   cc:   [name], M.D.  Fax: [phone]

## 2011-03-22 NOTE — Op Note (Signed)
[place]  Patient:    [name], [name] Visit Number: [id number] MRN: [id number]          Service Type: DSU Location: [facility] Attending Physician:  Ronne Binning Dictated by:   Nicki Reaper, M.D. Proc. Date: 03/02/02 Admit Date:  03/02/2002                             Operative Report  PREOPERATIVE DIAGNOSIS:  Carpal tunnel syndrome, right wrist, with significant degenerative changes, right wrist.  POSTOPERATIVE DIAGNOSIS:  Carpal tunnel syndrome, right wrist, with significant degenerative changes, right wrist.  OPERATION:  Carpal tunnel release, radial styloidectomy, scaphoid excision, ulnar four-bone fusion, right wrist.  SURGEON:  Nicki Reaper, M.D.  ASSISTANT:  Joaquin Courts, R.N.  ANESTHESIA:  Axillary block.  ANESTHESIOLOGIST:  Janetta Hora. Gelene Mink, M.D.  HISTORY:  The patient is a 77 year old male with a history of injury to his wrist, pain and swelling, carpal tunnel syndrome -- EMG and nerve conductions positive -- which has not responded to conservative treatment.  DESCRIPTION OF PROCEDURE:  The patient was brought to the operating room where an axillary block was carried out without difficulty.  He was prepped and draped using Betadine scrubbing solution with the right arm free.  A longitudinal incision was made in the palm and carried down through subcutaneous tissue.  Bleeders were electrocauterized.  Palmar fascia was split and superficial palmar arch identified, the flexor tendons to the ring and little fingers identified.  To the ulnar side of the median nerve, the carpal retinaculum was incised with sharp dissection.  A right-angle and Sewall retractor were placed between skin and forearm fascia.  The fascia was released for approximately 3 cm proximal to the wrist crease.  Canal was explored; no further lesions were identified.  The wound was irrigated.  Skin was closed with interrupted 5-0 nylon sutures.  A  dorsal incision was then made longitudinally over the dorsum of the wrist and carried down through subcutaneous tissue.  Bleeders were again electrocauterized.  Dissection was carried down to the third and fourth dorsal compartment interval.  This retinaculum was opened, the EPL freed.  An incision was then made in the capsule dorsally.  Very significant degenerative changes/excess bone formation were present.  The proximal scaphoid was identified.  There was a large area of bone growing over the dorsal aspect of the lunate, which was entirely free. This was removed with a rongeur.  Significant deformity was present to the proximal capitate with an indentation on the radial aspect rather than a convexed surface.  The proximal pole of the scaphoid was noted to be articulating with the lunate.  The distal segment of the scaphoid was in a significant humpback deformity with a nonunion present.  The scaphoid was then removed using a rongeur, curettes and elevators.  Both components were removed.  The joint was then opened.  A rongeur was used to remove the cartilage from the proximal hamate, proximal capitate and the distal lunate and triquetrum.  A bur was also used to remove the subchondral bone where significant sclerosis was present.  The cartilage and subchondral bone were removed from the lunotriquetral and the hamate and capitate joint and the joint was able to be reduced and pinned with a 0.62 K-wire, confirming position of capitate and lunate on AP and lateral x-rays, with a neutral alignment of each.  There was some impingement to the radial  styloid.  A rongeur was used to remove this portion of the styloid, taking care to protect the radioscaphocapitate ligament.  The bone graft was then harvested from the removed scaphoid.  This was packed into the interval after withdrawing the pin and then redriving it, confirming that the position was maintained.  The reamer for the Acutrak  hubcap plate was then placed and the four bones were reamed, removing the dorsal cortices; this was done until the plate was placed and found to lie between the cortices of all four bones.  Position was confirmed with OEC.  The drill holes were then placed for the screws and these measured between 10 and 16 mm, including two in each of the bones, except the hamate, which had only a single screw placed; this firmly affixed the plate. A full range of motion was able to be evaluated with equal dorsiflexion and palmar flexion, no impingement dorsally.  A small portion of dorsal radius had to be removed due to exostosis formation.  The wounds were irrigated, the guide portion of the plate removed, this was packed with bone, the pin removed, the fixation device placed to maintain position of the screws. X-rays confirmed positioning of the plate and screws in all four bones.  The wounds were copiously irrigated with saline containing Bacitracin, the capsule closed with figure-of-eight 4-0 Vicryl sutures, the retinaculum with 4-0 Vicryl, then the subcutaneous tissue with 4-0 Vicryl and the skin with a subcuticular 4-0 Monocryl suture.  Steri-Strips were applied, sterile compressive dressing and splint dorsally and palmarly were applied.  On deflation of the tourniquet, all fingers immediately pinked.  He was taken to the recovery room for observation in satisfactory condition.  He is admitted for overnight stay for pain control.  He will be discharged on Percocet and Keflex, to return in one week. Dictated by:   Nicki Reaper, M.D. Attending Physician:  Ronne Binning DD:  03/02/02 TD:  03/02/02 Job: [id number]

## 2011-08-20 ENCOUNTER — Other Ambulatory Visit: Payer: Self-pay | Admitting: Cardiology

## 2011-09-04 ENCOUNTER — Encounter: Payer: Self-pay | Admitting: Cardiovascular Disease

## 2011-09-04 ENCOUNTER — Ambulatory Visit (INDEPENDENT_AMBULATORY_CARE_PROVIDER_SITE_OTHER): Payer: Medicare Other | Admitting: *Deleted

## 2011-09-04 ENCOUNTER — Ambulatory Visit (INDEPENDENT_AMBULATORY_CARE_PROVIDER_SITE_OTHER): Payer: Medicare Other | Admitting: Cardiovascular Disease

## 2011-09-04 VITALS — BP 126/71 | HR 46 | Ht 69.0 in | Wt 177.4 lb

## 2011-09-04 DIAGNOSIS — E785 Hyperlipidemia, unspecified: Secondary | ICD-10-CM

## 2011-09-04 DIAGNOSIS — R0602 Shortness of breath: Secondary | ICD-10-CM

## 2011-09-04 DIAGNOSIS — I2510 Atherosclerotic heart disease of native coronary artery without angina pectoris: Secondary | ICD-10-CM

## 2011-09-04 LAB — HEPATIC FUNCTION PANEL
ALT: 29 U/L (ref 0–53)
AST: 30 U/L (ref 0–37)
Bilirubin, Direct: 0.2 mg/dL (ref 0.0–0.3)
Total Bilirubin: 0.8 mg/dL (ref 0.3–1.2)

## 2011-09-04 LAB — LIPID PANEL
Total CHOL/HDL Ratio: 3
VLDL: 20.6 mg/dL (ref 0.0–40.0)

## 2011-09-04 LAB — BASIC METABOLIC PANEL
BUN: 21 mg/dL (ref 6–23)
CO2: 23 mEq/L (ref 19–32)
Calcium: 9.9 mg/dL (ref 8.4–10.5)
GFR: 47.27 mL/min — ABNORMAL LOW (ref >60.00)
Glucose, Bld: 87 mg/dL (ref 70–99)

## 2011-09-04 NOTE — Patient Instructions (Signed)
Your physician recommends that you return for a FASTING lipid profile: today and in 6 months on return visit  Your physician wants you to follow-up in: 6 months  You will receive a reminder letter in the mail two months in advance. If you don't receive a letter, please call our office to schedule the follow-up appointment.

## 2011-09-04 NOTE — Assessment & Plan Note (Signed)
Ms. [name] is doing well. He's not had any episodes of chest pain. We'll continue the same medications. We'll check fasting lipid profile today and recheck again in 6 months when I see him for an  office visit.

## 2011-09-04 NOTE — Progress Notes (Signed)
[name] Date of Birth  [birth year] [facility] [address]    [address] [place], [place]  [address] [phone]  Fax  [phone]  History of Present Illness:  [name] is an elderly gentleman with a history of coronary artery disease. He status post PTCA and stenting of the midright coronary artery in April of this year. He's done well since I last saw him. He's not had any episodes of chest pain or shortness of breath. He's been tolerating his medications without any difficulty.  Current Outpatient Prescriptions on File Prior to Visit  Medication Sig Dispense Refill  . amLODipine (NORVASC) 5 MG tablet TAKE ONE TABLET BY MOUTH DAILY  30 tablet  5  . aspirin 81 MG tablet Take 81 mg by mouth daily.        . clopidogrel (PLAVIX) 75 MG tablet Take 75 mg by mouth daily.        . fenofibrate (TRICOR) 145 MG tablet Take 145 mg by mouth daily.        . fish oil-omega-3 fatty acids 1000 MG capsule Take 2 g by mouth daily.        Marland Kitchen glucosamine-chondroitin 500-400 MG tablet Take 1 tablet by mouth 2 (two) times daily.        . Multiple Vitamin (MULTIVITAMIN) tablet Take 1 tablet by mouth daily.        . niacin 500 MG tablet Take 500 mg by mouth daily.       . pravastatin (PRAVACHOL) 40 MG tablet Take 40 mg by mouth daily.        . sildenafil (VIAGRA) 25 MG tablet Take 25 mg by mouth daily as needed.          No Known Allergies  Past Medical History  Diagnosis Date  . Coronary artery disease     PCI, STent to RCA  . Hyperlipidemia   . Thyroid disease     hypothyroidism    Past Surgical History  Procedure Date  . Cardiac catheterization     2007 occluded ramus inter. , PCI to RCA  . Carpal tunnel release   . Appendectomy     History  Smoking status  . Former Smoker  . Quit date: 11/04/1960  Smokeless tobacco  . Not on file    History  Alcohol Use No    Family History  Problem Relation Age of Onset  . Hypertension Mother   . Hypertension Father      Reviw of Systems:  Reviewed in the HPI.  All other systems are negative.  Physical Exam: BP 126/71  Pulse 46  Ht 5\' 9"  (1.753 m)  Wt 177 lb 6.4 oz (80.468 kg)  BMI 26.20 kg/m2 The patient is alert and oriented x 3.  The mood and affect are normal.   Skin: warm and dry.  Color is normal.    HEENT:   [place]/AT, normal carotids, no bruits, no JVD  Lungs: clear to ausc.   Heart: RR, no murmurs    Abdomen: soft, good bowel sounds, no HSM  Extremities:  No c/c/e  Neuro:  Non focal, gait is normal.    ECG: Sinus bradycardia. Has a right bundle branch block. This is unchanged from his previous EKG.    Assessment / Plan:

## 2011-09-05 ENCOUNTER — Telehealth: Payer: Self-pay | Admitting: *Deleted

## 2011-09-05 NOTE — Telephone Encounter (Signed)
Normal lab result msg left 

## 2011-09-05 NOTE — Telephone Encounter (Signed)
Message copied by Antony Odea on Thu Sep 05, 2011  4:41 PM ------      Message from: [name]      Created: Wed Sep 04, 2011  8:11 PM       good

## 2011-09-06 ENCOUNTER — Telehealth: Payer: Self-pay | Admitting: Cardiovascular Disease

## 2011-09-06 NOTE — Telephone Encounter (Signed)
Walk in Pt Form " Pt Dropped off paper from Medical Consultation" sent to Message Nurse  09/06/11/km

## 2011-11-01 ENCOUNTER — Telehealth: Payer: Self-pay | Admitting: Cardiovascular Disease

## 2011-11-01 MED ORDER — AMLODIPINE BESYLATE 5 MG PO TABS: 5.0000 mg | ORAL_TABLET | Freq: Every day | ORAL | Status: DC

## 2011-11-01 MED ORDER — CLOPIDOGREL BISULFATE 75 MG PO TABS: 75.0000 mg | ORAL_TABLET | Freq: Every day | ORAL | Status: DC

## 2011-11-01 MED ORDER — NITROGLYCERIN 0.4 MG SL SUBL: 0.4000 mg | SUBLINGUAL_TABLET | SUBLINGUAL | Status: DC | PRN

## 2011-11-01 MED ORDER — FENOFIBRATE 145 MG PO TABS: 145.0000 mg | ORAL_TABLET | Freq: Every day | ORAL | Status: DC

## 2011-11-01 MED ORDER — PRAVASTATIN SODIUM 40 MG PO TABS: 40.0000 mg | ORAL_TABLET | Freq: Every day | ORAL | Status: DC

## 2011-11-01 NOTE — Telephone Encounter (Signed)
Refilled meds

## 2011-11-01 NOTE — Telephone Encounter (Signed)
PT'S WIFE CALLING RE REFILLS NEEDED FOR CLOPIDOGREL 75 MG, PRAVASTATIN 40mg , NITROSTAT 0.4 MG, LEVOTHYROXINE 75 MCG, FENOFIBRATE 145MG , AND NORVASC 5 MG PT NEEDS NEW RX'S, CHANGED PHARMACIES TO [facility]#[id number]

## 2011-11-06 ENCOUNTER — Other Ambulatory Visit: Payer: Self-pay | Admitting: *Deleted

## 2011-11-06 NOTE — Telephone Encounter (Signed)
Opened in Error.

## 2011-11-11 ENCOUNTER — Telehealth: Payer: Self-pay | Admitting: Cardiovascular Disease

## 2011-11-11 NOTE — Telephone Encounter (Signed)
Fu call °Pt returning your call  °

## 2011-11-11 NOTE — Telephone Encounter (Signed)
Pt was calling back from call for verification stating he is on tricor and pravastatin, pt confirmed. All questions answered

## 2012-09-01 ENCOUNTER — Telehealth: Payer: Self-pay | Admitting: Cardiovascular Disease

## 2012-09-01 NOTE — Telephone Encounter (Signed)
plz return call to pt wife [phone], regarding labs for 11/21 appnt w/[name]

## 2012-09-01 NOTE — Telephone Encounter (Signed)
Dicussed upcoming app, c/o husband being more tired, request tsh lab draw and send to pcp, told her we can do that at his ov, agreed to plan.

## 2012-09-24 ENCOUNTER — Encounter: Payer: Self-pay | Admitting: Cardiovascular Disease

## 2012-09-24 ENCOUNTER — Ambulatory Visit (INDEPENDENT_AMBULATORY_CARE_PROVIDER_SITE_OTHER): Payer: Medicare Other | Admitting: Cardiovascular Disease

## 2012-09-24 VITALS — BP 132/70 | HR 52 | Ht 69.0 in | Wt 180.4 lb

## 2012-09-24 DIAGNOSIS — I2510 Atherosclerotic heart disease of native coronary artery without angina pectoris: Secondary | ICD-10-CM

## 2012-09-24 DIAGNOSIS — I10 Essential (primary) hypertension: Secondary | ICD-10-CM

## 2012-09-24 DIAGNOSIS — R011 Cardiac murmur, unspecified: Secondary | ICD-10-CM

## 2012-09-24 LAB — BASIC METABOLIC PANEL
BUN: 23 mg/dL (ref 6–23)
CO2: 27 mEq/L (ref 19–32)
GFR: 47.15 mL/min — ABNORMAL LOW (ref >60.00)
Glucose, Bld: 104 mg/dL — ABNORMAL HIGH (ref 70–99)
Potassium: 4.5 mEq/L (ref 3.5–5.1)
Sodium: 139 mEq/L (ref 135–145)

## 2012-09-24 LAB — HEPATIC FUNCTION PANEL
AST: 23 U/L (ref 0–37)
Albumin: 4.3 g/dL (ref 3.5–5.2)
Alkaline Phosphatase: 45 U/L (ref 39–117)
Total Protein: 7.3 g/dL (ref 6.0–8.3)

## 2012-09-24 LAB — LIPID PANEL
Cholesterol: 154 mg/dL (ref 0–200)
HDL: 34.6 mg/dL — ABNORMAL LOW (ref >39.00)
LDL Cholesterol: 97 mg/dL (ref 0–99)
Triglycerides: 112 mg/dL (ref 0.0–149.0)

## 2012-09-24 LAB — TSH: TSH: 2.49 u[IU]/mL (ref 0.35–5.50)

## 2012-09-24 MED ORDER — PRAVASTATIN SODIUM 40 MG PO TABS: 40.0000 mg | ORAL_TABLET | Freq: Every day | ORAL | Status: DC

## 2012-09-24 MED ORDER — NIACIN ER (ANTIHYPERLIPIDEMIC) 500 MG PO TBCR: 500.0000 mg | EXTENDED_RELEASE_TABLET | Freq: Every day | ORAL | Status: DC

## 2012-09-24 MED ORDER — AMLODIPINE BESYLATE 5 MG PO TABS: 5.0000 mg | ORAL_TABLET | Freq: Every day | ORAL | Status: DC

## 2012-09-24 MED ORDER — CLOPIDOGREL BISULFATE 75 MG PO TABS: 75.0000 mg | ORAL_TABLET | Freq: Every day | ORAL | Status: DC

## 2012-09-24 MED ORDER — NITROGLYCERIN 0.4 MG SL SUBL: 0.4000 mg | SUBLINGUAL_TABLET | SUBLINGUAL | Status: DC | PRN

## 2012-09-24 MED ORDER — LEVOTHYROXINE SODIUM 75 MCG PO TABS: 75.0000 ug | ORAL_TABLET | Freq: Every day | ORAL | Status: DC

## 2012-09-24 NOTE — Progress Notes (Signed)
[name] Date of Birth  1930-01-14 [facility] [address]    [address] [place], [place]  [address] [phone]  Fax  [phone]  Problem List: 1. CAD 2. RBBB 3. Hypothyroidism 4. History of Present Illness:  [name] is an elderly gentleman with a history of coronary artery disease. He status post PTCA and stenting of the midright coronary artery in April of this year. He's done well since I last saw him. He's not had any episodes of chest pain or shortness of breath. He's been tolerating his medications without any difficulty.  No angina. No dyspnea.  Exercising regularly - golf 1 day a week. Works out in [employer] 3 days a week ( including spin class)  Current Outpatient Prescriptions on File Prior to Visit  Medication Sig Dispense Refill  . amLODipine (NORVASC) 5 MG tablet Take 1 tablet (5 mg total) by mouth daily.  90 tablet  3  . aspirin 81 MG tablet Take 81 mg by mouth daily.        . clopidogrel (PLAVIX) 75 MG tablet Take 1 tablet (75 mg total) by mouth daily.  90 tablet  3  . fenofibrate (TRICOR) 145 MG tablet Take 1 tablet (145 mg total) by mouth daily.  90 tablet  3  . fish oil-omega-3 fatty acids 1000 MG capsule Take 2 g by mouth daily.        Marland Kitchen glucosamine-chondroitin 500-400 MG tablet Take 1 tablet by mouth 2 (two) times daily.        Marland Kitchen levothyroxine (SYNTHROID, LEVOTHROID) 75 MCG tablet Take 75 mcg by mouth daily.        . Multiple Vitamin (MULTIVITAMIN) tablet Take 1 tablet by mouth daily.        . niacin 500 MG tablet Take 500 mg by mouth daily.       . nitroGLYCERIN (NITROSTAT) 0.4 MG SL tablet Place 1 tablet (0.4 mg total) under the tongue every 5 (five) minutes as needed.  100 tablet  prn  . pravastatin (PRAVACHOL) 40 MG tablet Take 1 tablet (40 mg total) by mouth daily.  90 tablet  3  . sildenafil (VIAGRA) 25 MG tablet Take 25 mg by mouth daily as needed.          No Known Allergies  Past Medical History  Diagnosis Date  . Coronary  artery disease     PCI, STent to RCA  . Hyperlipidemia   . Thyroid disease     hypothyroidism    Past Surgical History  Procedure Date  . Cardiac catheterization     2007 occluded ramus inter. , PCI to RCA  . Carpal tunnel release   . Appendectomy     History  Smoking status  . Former Smoker  . Quit date: 11/04/1960  Smokeless tobacco  . Not on file    History  Alcohol Use No    Family History  Problem Relation Age of Onset  . Hypertension Mother   . Hypertension Father     Reviw of Systems:  Reviewed in the HPI.  All other systems are negative.  Physical Exam: BP 132/70  Pulse 52  Ht 5\' 9"  (1.753 m)  Wt 180 lb 6.4 oz (81.829 kg)  BMI 26.64 kg/m2 The patient is alert and oriented x 3.  The mood and affect are normal.   Skin: warm and dry.  Color is normal.    HEENT:   [place]/AT, normal carotids, no bruits, no JVD  Lungs:  clear to ausc.   Heart: RR, he has a soft systolic murmur    Abdomen: soft, good bowel sounds, no HSM  Extremities:  No c/c/e, pulses are 1+ ( slightly diminished)  Neuro:  Non focal, gait is normal.    ECG: Nov. 21, 2013 - Sinus bradycardia 52 . Has a right bundle branch block. This is unchanged from his previous EKG.    Assessment / Plan:

## 2012-09-24 NOTE — Assessment & Plan Note (Signed)
[name] is doing well. He's not had any episodes of chest pain or shortness of breath. We will continue with his same medications.

## 2012-09-24 NOTE — Patient Instructions (Addendum)
Your physician recommends that you return for lab work in: today, bmet lipid hepatic and tsh  Your physician wants you to follow-up in: 1 year  You will receive a reminder letter in the mail two months in advance. If you don't receive a letter, please call our office to schedule the follow-up appointment.   Your physician recommends that you continue on your current medications as directed. Please refer to the Current Medication list given to you today.  Your physician has requested that you have an echocardiogram. Echocardiography is a painless test that uses sound waves to create images of your heart. It provides your doctor with information about the size and shape of your heart and how well your heart's chambers and valves are working. This procedure takes approximately one hour. There are no restrictions for this procedure.

## 2012-09-28 ENCOUNTER — Ambulatory Visit (HOSPITAL_COMMUNITY): Payer: Medicare Other | Attending: Cardiovascular Disease | Admitting: Radiology

## 2012-09-28 DIAGNOSIS — I2510 Atherosclerotic heart disease of native coronary artery without angina pectoris: Secondary | ICD-10-CM

## 2012-09-28 DIAGNOSIS — R011 Cardiac murmur, unspecified: Secondary | ICD-10-CM

## 2012-09-28 DIAGNOSIS — I079 Rheumatic tricuspid valve disease, unspecified: Secondary | ICD-10-CM | POA: Insufficient documentation

## 2012-09-28 DIAGNOSIS — I517 Cardiomegaly: Secondary | ICD-10-CM | POA: Insufficient documentation

## 2012-09-28 DIAGNOSIS — I10 Essential (primary) hypertension: Secondary | ICD-10-CM | POA: Insufficient documentation

## 2012-09-28 DIAGNOSIS — Z87891 Personal history of nicotine dependence: Secondary | ICD-10-CM | POA: Insufficient documentation

## 2012-09-28 NOTE — Progress Notes (Signed)
Echocardiogram performed.  

## 2012-11-09 ENCOUNTER — Telehealth: Payer: Self-pay | Admitting: Cardiovascular Disease

## 2012-11-09 ENCOUNTER — Other Ambulatory Visit: Payer: Self-pay | Admitting: *Deleted

## 2012-11-09 MED ORDER — FENOFIBRATE 145 MG PO TABS: 145.0000 mg | ORAL_TABLET | Freq: Every day | ORAL | Status: DC

## 2012-11-09 NOTE — Telephone Encounter (Signed)
Refill completed, pt made aware.

## 2012-11-09 NOTE — Telephone Encounter (Signed)
fenoffibrate 145 mg called [facility] because they did not send it so dose he still need to take it or no

## 2012-11-09 NOTE — Progress Notes (Signed)
Refill completed.

## 2012-12-30 ENCOUNTER — Encounter: Payer: Self-pay | Admitting: Cardiovascular Disease

## 2015-10-05 DIAGNOSIS — E079 Disorder of thyroid, unspecified: Secondary | ICD-10-CM | POA: Insufficient documentation

## 2015-10-05 DIAGNOSIS — E039 Hypothyroidism, unspecified: Secondary | ICD-10-CM | POA: Insufficient documentation

## 2015-10-09 ENCOUNTER — Other Ambulatory Visit: Payer: Self-pay | Admitting: *Deleted

## 2015-10-09 ENCOUNTER — Ambulatory Visit (INDEPENDENT_AMBULATORY_CARE_PROVIDER_SITE_OTHER): Payer: Medicare Other | Admitting: Cardiovascular Disease

## 2015-10-09 ENCOUNTER — Encounter: Payer: Self-pay | Admitting: Cardiovascular Disease

## 2015-10-09 VITALS — BP 148/74 | HR 53 | Ht 69.0 in | Wt 184.8 lb

## 2015-10-09 DIAGNOSIS — I2510 Atherosclerotic heart disease of native coronary artery without angina pectoris: Secondary | ICD-10-CM | POA: Diagnosis not present

## 2015-10-09 DIAGNOSIS — I10 Essential (primary) hypertension: Secondary | ICD-10-CM | POA: Diagnosis not present

## 2015-10-09 DIAGNOSIS — E785 Hyperlipidemia, unspecified: Secondary | ICD-10-CM | POA: Insufficient documentation

## 2015-10-09 DIAGNOSIS — E1169 Type 2 diabetes mellitus with other specified complication: Secondary | ICD-10-CM | POA: Insufficient documentation

## 2015-10-09 DIAGNOSIS — I779 Disorder of arteries and arterioles, unspecified: Secondary | ICD-10-CM

## 2015-10-09 DIAGNOSIS — I739 Peripheral vascular disease, unspecified: Secondary | ICD-10-CM

## 2015-10-09 NOTE — Progress Notes (Signed)
[name] Date of Birth  Oct 03, 1930 [facility] [address]    [address] [place], [place]  [address] [phone]  Fax  [phone]  Problem List: 1. CAD 2. RBBB 3. Hypothyroidism 4. Carotid bruits 5. hyperlipidemia  History of Present Illness:  [name] is an elderly gentleman with a history of coronary artery disease. He status post PTCA and stenting of the midright coronary artery in April of this year. He's done well since I last saw him. He's not had any episodes of chest pain or shortness of breath. He's been tolerating his medications without any difficulty.  No angina. No dyspnea.  Exercising regularly - golf 1 day a week. Works out in [employer] 3 days a week ( including spin class)  Dec. 5, 2016:  Was last seen in 2013. Recently saw Nadyne CoombesKaren Richter. 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 Has had some stomach / lower chest pain.  Sharp pain . Has not carried any NTG in years.  Still working out regularly - without any angina .   Works out for 2 hours. Still playing golf several days a week.    Current Outpatient Prescriptions on File Prior to Visit  Medication Sig Dispense Refill  . amLODipine (NORVASC) 5 MG tablet Take 1 tablet (5 mg total) by mouth daily. 90 tablet 3  . aspirin 81 MG tablet Take 81 mg by mouth daily.      . clopidogrel (PLAVIX) 75 MG tablet Take 1 tablet (75 mg total) by mouth daily. 90 tablet 3  . fish oil-omega-3 fatty acids 1000 MG capsule Take 2 g by mouth daily.      Marland Kitchen. glucosamine-chondroitin 500-400 MG tablet Take 1 tablet by mouth 2 (two) times daily.      Marland Kitchen. levothyroxine (SYNTHROID, LEVOTHROID) 75 MCG tablet Take 1 tablet (75 mcg total) by mouth daily. 90 tablet 3  . Multiple Vitamin (MULTIVITAMIN) tablet Take 1 tablet by mouth daily.      . niacin (NIASPAN) 500 MG CR tablet Take 1 tablet (500 mg total) by mouth at bedtime. 90 tablet 3  . nitroGLYCERIN (NITROSTAT) 0.4 MG SL tablet Place 1 tablet (0.4 mg  total) under the tongue every 5 (five) minutes as needed for chest pain. 25 tablet 5  . pravastatin (PRAVACHOL) 40 MG tablet Take 1 tablet (40 mg total) by mouth daily. 90 tablet 3  . sildenafil (VIAGRA) 25 MG tablet Take 25 mg by mouth daily as needed.       No current facility-administered medications on file prior to visit.    No Known Allergies  Past Medical History  Diagnosis Date  . Coronary artery disease     PCI, STent to RCA  . Hyperlipidemia   . Thyroid disease     hypothyroidism    Past Surgical History  Procedure Laterality Date  . Cardiac catheterization      2007 occluded ramus inter. , PCI to RCA  . Carpal tunnel release    . Appendectomy      History  Smoking status  . Former Smoker  . Quit date: 11/04/1960  Smokeless tobacco  . Not on file    History  Alcohol Use No    Family History  Problem Relation Age of Onset  . Hypertension Mother   . Hypertension Father     Reviw of Systems:  Reviewed in the HPI.  All other systems are negative.  Physical Exam: BP 148/74 mmHg  Pulse 53  Ht  (1.753 m)  Wt 184 lb 12.8 oz (83.825 kg)  BMI 27.28 kg/m2 The patient is alert and oriented x 3.  The mood and affect are normal.   Skin: warm and dry.  Color is normal.    HEENT:   [place]/AT, normal carotids, soft bilateral carotid  Bruits, right > left ,  no JVD  Lungs: clear to ausc.   Heart: RR, he has a soft systolic murmur    Abdomen: soft, good bowel sounds, no HSM  Extremities:  No c/c/e, pulses are 1+ ( slightly diminished)  Neuro:  Non focal, gait is normal.    ECG: 10/09/2015: Sinus bradycardia 53. He has a right bundle-branch block. There are no changes from his previous EKG.  Assessment / Plan:   1. CAD -  No angina . His goal LDL is 70.   Have requested labs from his primary medical doctor. 2. RBBB 3. Hypothyroidism 4. Carotid bruits - he has a history of moderate carotid artery disease by duplex scan in 2010. We will repeat a  carotid duplex scan. 5. Hyperlipidemia- he's currently on Pravachol 40 mg a day. His goal LDL is around 70. If his LDL is not 70 then we should consider changing to atorvastatin or Crestor.    [name], Deloris Ping, MD  10/09/2015 8:51 AM    [facility] [address] [place],  [address] [place], [place]  [address] Pager [phone] Phone: [phone]; Fax: [phone]   [facility]  [address] [place], [place]  [address] [phone]   Fax [phone]

## 2015-10-09 NOTE — Patient Instructions (Addendum)
Medication Instructions:  Your physician recommends that you continue on your current medications as directed. Please refer to the Current Medication list given to you today.   Labwork: Your physician recommends that you return for lab work in: 1 year on the day of or a few days before your office visit with Dr. Elease HashimotoNahser.  You will need to FAST for this appointment - nothing to eat or drink after midnight the night before except water.    Testing/Procedures: Your physician has requested that you have a carotid duplex. This test is an ultrasound of the carotid arteries in your neck. It looks at blood flow through these arteries that supply the brain with blood. Allow one hour for this exam. There are no restrictions or special instructions.    Follow-Up: Your physician wants you to follow-up in: 1 year with Dr. Elease HashimotoNahser.  You will receive a reminder letter in the mail two months in advance. If you don't receive a letter, please call our office to schedule the follow-up appointment.  If you need a refill on your cardiac medications before your next appointment, please call your pharmacy.   Thank you for choosing [facility]! Eligha BridegroomMichelle Brandyn Lowrey, RN [phone]

## 2015-10-16 ENCOUNTER — Ambulatory Visit (HOSPITAL_COMMUNITY)
Admission: RE | Admit: 2015-10-16 | Discharge: 2015-10-16 | Disposition: A | Discharge status: Home / Self Care | Payer: Medicare Other | Source: Ambulatory Visit | Attending: Cardiology | Admitting: Cardiology

## 2015-10-16 DIAGNOSIS — I739 Peripheral vascular disease, unspecified: Secondary | ICD-10-CM

## 2015-10-16 DIAGNOSIS — I6523 Occlusion and stenosis of bilateral carotid arteries: Secondary | ICD-10-CM | POA: Insufficient documentation

## 2015-10-16 DIAGNOSIS — E785 Hyperlipidemia, unspecified: Secondary | ICD-10-CM | POA: Diagnosis not present

## 2015-10-16 DIAGNOSIS — I779 Disorder of arteries and arterioles, unspecified: Secondary | ICD-10-CM | POA: Insufficient documentation

## 2016-04-23 ENCOUNTER — Other Ambulatory Visit: Payer: Self-pay

## 2016-04-24 ENCOUNTER — Other Ambulatory Visit: Payer: Self-pay

## 2016-04-24 ENCOUNTER — Other Ambulatory Visit: Payer: Self-pay | Admitting: *Deleted

## 2016-04-24 MED ORDER — AMLODIPINE BESYLATE 5 MG PO TABS: 5.0000 mg | ORAL_TABLET | Freq: Every day | ORAL | Status: DC

## 2016-04-24 MED ORDER — PRAVASTATIN SODIUM 40 MG PO TABS: 40.0000 mg | ORAL_TABLET | Freq: Every day | ORAL | Status: DC

## 2016-04-24 MED ORDER — CLOPIDOGREL BISULFATE 75 MG PO TABS: 75.0000 mg | ORAL_TABLET | Freq: Every day | ORAL | Status: DC

## 2016-04-24 NOTE — Telephone Encounter (Signed)
No, he will not refill levothyroxine.  This will need to be filled by PCP or endocrinologist

## 2016-04-24 NOTE — Telephone Encounter (Signed)
CALLED TO INFORM PT THAT [facility] WOULD HAVE TO SEND THE LEVOTHYROXINE, PT AWARE & EXPRESSED UNDERSTANDING

## 2016-10-01 ENCOUNTER — Encounter: Payer: Self-pay | Admitting: Cardiovascular Disease

## 2016-10-02 ENCOUNTER — Other Ambulatory Visit: Payer: Medicare Other | Admitting: *Deleted

## 2016-10-02 DIAGNOSIS — I2510 Atherosclerotic heart disease of native coronary artery without angina pectoris: Secondary | ICD-10-CM

## 2016-10-02 LAB — LIPID PANEL
Cholesterol: 134 mg/dL (ref <200)
HDL: 28 mg/dL — AB (ref >40)
LDL Cholesterol: 69 mg/dL (ref <100)
TRIGLYCERIDES: 185 mg/dL — AB (ref <150)
Total CHOL/HDL Ratio: 4.8 Ratio (ref <5.0)
VLDL: 37 mg/dL — ABNORMAL HIGH (ref <30)

## 2016-10-02 LAB — COMPREHENSIVE METABOLIC PANEL
ALBUMIN: 4 g/dL (ref 3.6–5.1)
ALT: 24 U/L (ref 9–46)
AST: 20 U/L (ref 10–35)
Alkaline Phosphatase: 55 U/L (ref 40–115)
BUN: 17 mg/dL (ref 7–25)
CHLORIDE: 106 mmol/L (ref 98–110)
CO2: 27 mmol/L (ref 20–31)
Calcium: 9.4 mg/dL (ref 8.6–10.3)
Creat: 1.3 mg/dL — ABNORMAL HIGH (ref 0.70–1.11)
Glucose, Bld: 111 mg/dL — ABNORMAL HIGH (ref 65–99)
POTASSIUM: 4.2 mmol/L (ref 3.5–5.3)
Sodium: 140 mmol/L (ref 135–146)
TOTAL PROTEIN: 6.3 g/dL (ref 6.1–8.1)
Total Bilirubin: 0.7 mg/dL (ref 0.2–1.2)

## 2016-10-08 ENCOUNTER — Encounter: Payer: Self-pay | Admitting: Cardiovascular Disease

## 2016-10-08 ENCOUNTER — Ambulatory Visit (INDEPENDENT_AMBULATORY_CARE_PROVIDER_SITE_OTHER): Payer: Medicare Other | Admitting: Cardiovascular Disease

## 2016-10-08 VITALS — BP 130/60 | HR 64 | Ht 68.0 in | Wt 181.4 lb

## 2016-10-08 DIAGNOSIS — I10 Essential (primary) hypertension: Secondary | ICD-10-CM | POA: Diagnosis not present

## 2016-10-08 DIAGNOSIS — I739 Peripheral vascular disease, unspecified: Secondary | ICD-10-CM

## 2016-10-08 DIAGNOSIS — I779 Disorder of arteries and arterioles, unspecified: Secondary | ICD-10-CM

## 2016-10-08 DIAGNOSIS — Z79899 Other long term (current) drug therapy: Secondary | ICD-10-CM

## 2016-10-08 DIAGNOSIS — I2510 Atherosclerotic heart disease of native coronary artery without angina pectoris: Secondary | ICD-10-CM

## 2016-10-08 DIAGNOSIS — E785 Hyperlipidemia, unspecified: Secondary | ICD-10-CM

## 2016-10-08 NOTE — Progress Notes (Signed)
[name] Date of Birth  [birth year]  [address]    [address] [place], [place]  [address] [phone]  Problem List: 1. CAD 2. RBBB 3. Hypothyroidism 4. Carotid bruits 5. hyperlipidemia   [name] is an elderly gentleman with a history of coronary artery disease. He status post PTCA and stenting of the mid-right coronary artery in April of this year. He's done well since I last saw him. He's not had any episodes of chest pain or shortness of breath. He's been tolerating his medications without any difficulty.  No angina. No dyspnea.  Exercising regularly - golf 1 day a week. Works out in [employer] 3 days a week ( including spin class)  Dec. 5, 2016:  Was last seen in 2013. Recently saw Nadyne CoombesKaren Richter. 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 Has had some stomach / lower chest pain.  Sharp pain . Has not carried any NTG in years.  Still working out regularly - without any angina .   Works out for 2 hours. Still playing golf several days a week.   Dec. 5, 2017:  Doing well. Played 27 holes of golf yesterday. Goes to the gym 3 days a week - spin class.    Current Outpatient Prescriptions on File Prior to Visit  Medication Sig Dispense Refill  . amLODipine (NORVASC) 5 MG tablet Take 1 tablet (5 mg total) by mouth daily. 90 tablet 1  . aspirin 81 MG tablet Take 81 mg by mouth daily.      . clopidogrel (PLAVIX) 75 MG tablet Take 1 tablet (75 mg total) by mouth daily. 90 tablet 1  . fish oil-omega-3 fatty acids 1000 MG capsule Take 2 g by mouth daily.      Marland Kitchen. glucosamine-chondroitin 500-400 MG tablet Take 1 tablet by mouth 2 (two) times daily.      Marland Kitchen. levothyroxine (SYNTHROID, LEVOTHROID) 75 MCG tablet Take 1 tablet (75 mcg total) by mouth daily. 90 tablet 3  . Multiple Vitamin (MULTIVITAMIN) tablet Take 1 tablet by mouth daily.      . nitroGLYCERIN (NITROSTAT) 0.4 MG SL tablet Place 1 tablet (0.4 mg total) under the tongue every 5 (five) minutes as  needed for chest pain. 25 tablet 5  . pravastatin (PRAVACHOL) 40 MG tablet Take 1 tablet (40 mg total) by mouth daily. 90 tablet 1   No current facility-administered medications on file prior to visit.     No Known Allergies  Past Medical History:  Diagnosis Date  . Coronary artery disease    PCI, STent to RCA  . Hyperlipidemia   . Thyroid disease    hypothyroidism    Past Surgical History:  Procedure Laterality Date  . APPENDECTOMY    . CARDIAC CATHETERIZATION     2007 occluded ramus inter. , PCI to RCA  . CARPAL TUNNEL RELEASE      History  Smoking Status  . Former Smoker  . Quit date: 11/04/1960  Smokeless Tobacco  . Never Used    History  Alcohol Use No    Family History  Problem Relation Age of Onset  . Hypertension Mother   . Hypertension Father     Reviw of Systems:  Reviewed in the HPI.  All other systems are negative.  Physical Exam: BP 130/60   Pulse 64   Ht 5\' 8"  (1.727 m)   Wt 181 lb 6.4 oz (82.3 kg)   SpO2 94%   BMI 27.58 kg/m  The patient is alert and oriented x 3.  The mood and affect are normal.   Skin: warm and dry.  Color is normal.   HEENT:   [place]/AT, normal carotids, soft bilateral carotid  Bruits, right > left ,  no JVD Lungs: clear to ausc.  Heart: RR, he has a 2/6 systolic murmur   Abdomen: soft, good bowel sounds, no HSM Extremities:  No c/c/e, pulses are 1+ ( slightly diminished) Neuro:  Non focal, gait is normal.    ECG: Dec. 5, 2017:   Sinus brady at 54.   RBBB   Assessment / Plan:   1. CAD -  No angina . His goal LDL is 70.    Trigs are slighly elevated.   Needs to work on his carb intake. .  2. RBBB - stable   3. Hypothyroidism 4. Carotid bruits - he has a history of moderate carotid artery disease by duplex scan in 2010. We will repeat a carotid duplex scan.  5. Hyperlipidemia- he's currently on Pravachol 40 mg a day. His goal LDL is around 70.     Kristeen MissPhilip [name], MD  10/08/2016 8:13 AM    [facility] [address]  [address] [place], [place]  [address] Pager [phone] Phone: [phone]; Fax: [phone]

## 2016-10-08 NOTE — Patient Instructions (Addendum)
Medication Instructions:    Your physician recommends that you continue on your current medications as directed. Please refer to the Current Medication list given to you today.  --- If you need a refill on your cardiac medications before your next appointment, please call your pharmacy. ---  Labwork:  Your physician recommends that you return for lab work in: 1 year (before follow up with Dr. Elease HashimotoNahser)  for BMET, LFTs, Lipid profile  -- You will need to be fasting for this blood work --  Testing/Procedures: Your physician has requested that you have a carotid duplex. This test is an ultrasound of the carotid arteries in your neck. It looks at blood flow through these arteries that supply the brain with blood. Allow one hour for this exam. There are no restrictions or special instructions.  Follow-Up:  Your physician wants you to follow-up in: 1 year with Dr. Elease HashimotoNahser.  You will receive a reminder letter in the mail two months in advance. If you don't receive a letter, please call our office to schedule the follow-up appointment.  Thank you for choosing [facility]!!

## 2016-10-24 ENCOUNTER — Ambulatory Visit (HOSPITAL_COMMUNITY)
Admission: RE | Admit: 2016-10-24 | Discharge: 2016-10-24 | Disposition: A | Discharge status: Home / Self Care | Payer: Medicare Other | Source: Ambulatory Visit | Attending: Cardiology | Admitting: Cardiology

## 2016-10-24 DIAGNOSIS — I6523 Occlusion and stenosis of bilateral carotid arteries: Secondary | ICD-10-CM | POA: Insufficient documentation

## 2016-10-24 DIAGNOSIS — E785 Hyperlipidemia, unspecified: Secondary | ICD-10-CM | POA: Diagnosis not present

## 2016-10-24 DIAGNOSIS — I739 Peripheral vascular disease, unspecified: Secondary | ICD-10-CM

## 2016-10-24 DIAGNOSIS — I779 Disorder of arteries and arterioles, unspecified: Secondary | ICD-10-CM | POA: Diagnosis present

## 2016-11-26 ENCOUNTER — Other Ambulatory Visit: Payer: Self-pay | Admitting: Cardiovascular Disease

## 2016-11-26 NOTE — Telephone Encounter (Signed)
Rx refill sent to pharmacy. 

## 2017-02-10 ENCOUNTER — Other Ambulatory Visit: Payer: Self-pay | Admitting: Cardiovascular Disease

## 2017-05-06 ENCOUNTER — Other Ambulatory Visit: Payer: Self-pay | Admitting: Cardiovascular Disease

## 2017-07-28 ENCOUNTER — Other Ambulatory Visit: Payer: Self-pay | Admitting: Cardiovascular Disease

## 2017-08-28 ENCOUNTER — Other Ambulatory Visit: Payer: Self-pay | Admitting: *Deleted

## 2017-08-28 DIAGNOSIS — I6523 Occlusion and stenosis of bilateral carotid arteries: Secondary | ICD-10-CM

## 2017-10-15 ENCOUNTER — Ambulatory Visit (HOSPITAL_COMMUNITY)
Admission: RE | Admit: 2017-10-15 | Discharge: 2017-10-15 | Disposition: A | Discharge status: Home / Self Care | Payer: Medicare Other | Source: Ambulatory Visit | Attending: Cardiology | Admitting: Cardiology

## 2017-10-15 DIAGNOSIS — I6523 Occlusion and stenosis of bilateral carotid arteries: Secondary | ICD-10-CM | POA: Insufficient documentation

## 2017-10-30 ENCOUNTER — Other Ambulatory Visit: Payer: Self-pay | Admitting: Cardiovascular Disease

## 2017-11-06 ENCOUNTER — Other Ambulatory Visit: Payer: Self-pay

## 2017-11-06 ENCOUNTER — Other Ambulatory Visit: Payer: Self-pay | Admitting: Nurse Practitioner

## 2017-11-06 DIAGNOSIS — E782 Mixed hyperlipidemia: Secondary | ICD-10-CM

## 2017-11-06 DIAGNOSIS — I2510 Atherosclerotic heart disease of native coronary artery without angina pectoris: Secondary | ICD-10-CM

## 2017-11-06 MED ORDER — CLOPIDOGREL BISULFATE 75 MG PO TABS: 75.0000 mg | ORAL_TABLET | Freq: Every day | ORAL | 0 refills | Status: DC

## 2017-11-11 ENCOUNTER — Other Ambulatory Visit: Payer: Medicare Other

## 2017-11-11 DIAGNOSIS — I2510 Atherosclerotic heart disease of native coronary artery without angina pectoris: Secondary | ICD-10-CM

## 2017-11-11 DIAGNOSIS — E782 Mixed hyperlipidemia: Secondary | ICD-10-CM

## 2017-11-11 LAB — LIPID PANEL
CHOL/HDL RATIO: 4.2 ratio (ref 0.0–5.0)
Cholesterol, Total: 126 mg/dL (ref 100–199)
HDL: 30 mg/dL — AB (ref >39)
LDL Calculated: 67 mg/dL (ref 0–99)
Triglycerides: 146 mg/dL (ref 0–149)
VLDL CHOLESTEROL CAL: 29 mg/dL (ref 5–40)

## 2017-11-11 LAB — BASIC METABOLIC PANEL
BUN / CREAT RATIO: 14 (ref 10–24)
BUN: 18 mg/dL (ref 8–27)
CHLORIDE: 105 mmol/L (ref 96–106)
CO2: 23 mmol/L (ref 20–29)
Calcium: 9.4 mg/dL (ref 8.6–10.2)
Creatinine, Ser: 1.29 mg/dL — ABNORMAL HIGH (ref 0.76–1.27)
GFR calc Af Amer: 58 mL/min/{1.73_m2} — ABNORMAL LOW (ref >59)
GFR calc non Af Amer: 50 mL/min/{1.73_m2} — ABNORMAL LOW (ref >59)
GLUCOSE: 113 mg/dL — AB (ref 65–99)
Potassium: 4.6 mmol/L (ref 3.5–5.2)
SODIUM: 142 mmol/L (ref 134–144)

## 2017-11-11 LAB — HEPATIC FUNCTION PANEL
ALT: 20 IU/L (ref 0–44)
AST: 17 IU/L (ref 0–40)
Albumin: 4 g/dL (ref 3.5–4.7)
Alkaline Phosphatase: 65 IU/L (ref 39–117)
Bilirubin Total: 0.5 mg/dL (ref 0.0–1.2)
Bilirubin, Direct: 0.15 mg/dL (ref 0.00–0.40)
TOTAL PROTEIN: 6.4 g/dL (ref 6.0–8.5)

## 2017-11-18 ENCOUNTER — Other Ambulatory Visit: Payer: Self-pay | Admitting: Cardiovascular Disease

## 2017-11-19 ENCOUNTER — Ambulatory Visit: Payer: Medicare Other | Admitting: Cardiovascular Disease

## 2017-11-19 ENCOUNTER — Encounter: Payer: Self-pay | Admitting: Cardiovascular Disease

## 2017-11-19 VITALS — BP 134/58 | HR 54 | Ht 68.0 in | Wt 180.8 lb

## 2017-11-19 DIAGNOSIS — I6523 Occlusion and stenosis of bilateral carotid arteries: Secondary | ICD-10-CM | POA: Diagnosis not present

## 2017-11-19 DIAGNOSIS — I2510 Atherosclerotic heart disease of native coronary artery without angina pectoris: Secondary | ICD-10-CM

## 2017-11-19 DIAGNOSIS — E785 Hyperlipidemia, unspecified: Secondary | ICD-10-CM | POA: Diagnosis not present

## 2017-11-19 NOTE — Progress Notes (Signed)
[name] Date of Birth  [birth year]  [address]    [address] [place], [place]  [id number] [phone]  Problem List: 1. CAD 2. RBBB 3. Hypothyroidism 4. Carotid bruits 5. hyperlipidemia   [name] is an elderly gentleman with a history of coronary artery disease. He status post PTCA and stenting of the mid-right coronary artery in April of this year. He's done well since I last saw him. He's not had any episodes of chest pain or shortness of breath. He's been tolerating his medications without any difficulty.  No angina. No dyspnea.  Exercising regularly - golf 1 day a week. Works out in [employer] 3 days a week ( including spin class)  Dec. 5, 2016:  Was last seen in 2013. Recently saw Nadyne CoombesKaren Richter. 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 Has had some stomach / lower chest pain.  Sharp pain . Has not carried any NTG in years.  Still working out regularly - without any angina .   Works out for 2 hours. Still playing golf several days a week.   Dec. 5, 2017:  Doing well. Played 27 holes of golf yesterday. Goes to the gym 3 days a week - spin class.   Jan. 16, 2019:  Molly MaduroRobert is seen back today for follow-up of his coronary artery disease and carotid artery disease.  He had a carotid duplex scan in December, 2018 which reveals moderate bilateral carotid disease.  He will be scheduled to repeat this in 1 year.  Fasting labs recently reveal normal electrolytes and a stable creatinine at 1.29.  His liver enzymes are normal.  Total cholesterol is 126.  His HDL is stable at 30.  LDL is 67.  Triglycerides are 146.  Still exercising at [employer] 3 days a week.   Plays golf regularly  No CP or dyspnea.    Current Outpatient Medications on File Prior to Visit  Medication Sig Dispense Refill  . amLODipine (NORVASC) 5 MG tablet TAKE ONE TABLET BY MOUTH EVERY DAY 90 tablet 3  . aspirin 81 MG tablet Take 81 mg by mouth daily.      . clopidogrel (PLAVIX)  75 MG tablet Take 1 tablet (75 mg total) by mouth daily. 90 tablet 0  . fish oil-omega-3 fatty acids 1000 MG capsule Take 2 g by mouth daily.      Marland Kitchen. glucosamine-chondroitin 500-400 MG tablet Take 1 tablet by mouth 2 (two) times daily.      Marland Kitchen. levothyroxine (SYNTHROID, LEVOTHROID) 88 MCG tablet Take 88 mcg by mouth daily.  3  . Multiple Vitamin (MULTIVITAMIN) tablet Take 1 tablet by mouth daily.      . nitroGLYCERIN (NITROSTAT) 0.4 MG SL tablet Place 1 tablet (0.4 mg total) under the tongue every 5 (five) minutes as needed for chest pain. 25 tablet 5  . pravastatin (PRAVACHOL) 40 MG tablet TAKE ONE TABLET BY MOUTH DAILY 90 tablet 0   No current facility-administered medications on file prior to visit.     No Known Allergies  Past Medical History:  Diagnosis Date  . Coronary artery disease    PCI, STent to RCA  . Hyperlipidemia   . Thyroid disease    hypothyroidism    Past Surgical History:  Procedure Laterality Date  . APPENDECTOMY    . CARDIAC CATHETERIZATION     2007 occluded ramus inter. , PCI to RCA  . CARPAL TUNNEL RELEASE      Social  History   Tobacco Use  Smoking Status Former Smoker  . Last attempt to quit: 11/04/1960  . Years since quitting: 57.0  Smokeless Tobacco Never Used    Social History   Substance and Sexual Activity  Alcohol Use No    Family History  Problem Relation Age of Onset  . Hypertension Mother   . Hypertension Father     Reviw of Systems:  Reviewed in the HPI.  All other systems are negative.  Physical Exam: Blood pressure (!) 134/58, pulse (!) 54, height 5\' 8"  (1.727 m), weight 180 lb 12.8 oz (82 kg), SpO2 97 %.  GEN:  Well nourished, well developed in no acute distress HEENT: Normal NECK: No JVD;  Soft bilateral carotid bruits LYMPHATICS: No lymphadenopathy CARDIAC: RR, 1-2 / 6 systolic murmur  RESPIRATORY:  Clear to auscultation without rales, wheezing or rhonchi  ABDOMEN: Soft, non-tender, non-distended MUSCULOSKELETAL:  No  edema; No deformity  SKIN: Warm and dry NEUROLOGIC:  Alert and oriented x 3   ECG: Jan.  16, 2019: Sinus bradycardia at a rate of 54.  Right bundle branch block.  No changes from previous EKG.  Assessment / Plan:   1. CAD -  No angina .  Doing well  , exercises regularly . Continue current meds   2. RBBB - stable   3. Hypothyroidism- managed by his primary md   4. Carotid bruits -   recent carotid duplex scan shows stable carotid disease.  We will repeat the scan in 1 year.  5. Hyperlipidemia-   Labs are good.   Recheck in 1 year.   [name], MD  11/19/2017 10:05 AM    [facility] [address] [place],  [address] [place], [place]  [address] Pager [phone] Phone: [phone]; Fax: [phone]

## 2017-11-19 NOTE — Patient Instructions (Addendum)
Medication Instructions:  Your physician recommends that you continue on your current medications as directed. Please refer to the Current Medication list given to you today.   Labwork: Your physician recommends that you return for lab work in: 1 year on the day of or a few days before your office visit  You will need to FAST for this appointment - nothing to eat or drink after midnight the night before except water.    Testing/Procedures: Your physician has requested that you have a carotid duplex in December 2019. This test is an ultrasound of the carotid arteries in your neck. It looks at blood flow through these arteries that supply the brain with blood. Allow one hour for this exam. There are no restrictions or special instructions.   Follow-Up: Your physician wants you to follow-up in: 1 year with Dr. Harvie BridgeNahser's PA, Tereso NewcomerScott Weaver.  You will receive a reminder letter in the mail two months in advance. If you don't receive a letter, please call our office to schedule the follow-up appointment.   If you need a refill on your cardiac medications before your next appointment, please call your pharmacy.   Thank you for choosing [facility]! Eligha BridegroomMichelle Sanaz Scarlett, RN [phone]

## 2018-02-07 ENCOUNTER — Other Ambulatory Visit: Payer: Self-pay | Admitting: Cardiovascular Disease

## 2018-02-09 ENCOUNTER — Other Ambulatory Visit: Payer: Self-pay | Admitting: Cardiovascular Disease

## 2018-02-09 MED ORDER — AMLODIPINE BESYLATE 5 MG PO TABS: 5.0000 mg | ORAL_TABLET | Freq: Every day | ORAL | 2 refills | Status: DC

## 2018-02-09 MED ORDER — PRAVASTATIN SODIUM 40 MG PO TABS: 40.0000 mg | ORAL_TABLET | Freq: Every day | ORAL | 2 refills | Status: DC

## 2018-02-09 MED ORDER — CLOPIDOGREL BISULFATE 75 MG PO TABS: 75.0000 mg | ORAL_TABLET | Freq: Every day | ORAL | 2 refills | Status: DC

## 2018-06-10 ENCOUNTER — Other Ambulatory Visit: Payer: Self-pay | Admitting: Nurse Practitioner

## 2018-06-10 DIAGNOSIS — R1011 Right upper quadrant pain: Secondary | ICD-10-CM

## 2018-06-17 ENCOUNTER — Ambulatory Visit
Admission: RE | Admit: 2018-06-17 | Discharge: 2018-06-17 | Disposition: A | Discharge status: Home / Self Care | Payer: Medicare Other | Source: Ambulatory Visit | Attending: Nurse Practitioner | Admitting: Nurse Practitioner

## 2018-06-17 DIAGNOSIS — R1011 Right upper quadrant pain: Secondary | ICD-10-CM

## 2018-06-19 ENCOUNTER — Telehealth: Payer: Self-pay | Admitting: Cardiovascular Disease

## 2018-06-19 NOTE — Telephone Encounter (Signed)
New Message       [facility] Pre-operative Risk Assessment    Request for surgical clearance:  1. What type of surgery is being performed? Dental Extraction  2. When is this surgery scheduled? 06/24/2018   3. What type of clearance is required (medical clearance vs. Pharmacy clearance to hold med vs. Both)? Pharmacy   4. Are there any medications that need to be held prior to surgery and how long? Plavix providers discretion for how long    5. Practice name and name of physician performing surgery? Retta Mac DDS, Dr. Retta Mac   6. What is your office phone number [phone]   7.   What is your office fax number [phone]  8.   Anesthesia type (None, local, MAC, general) ? Lidocaine or septicaine    Avaletta L Williams 06/19/2018, 9:31 AM  _________________________________________________________________   (provider comments below)

## 2018-06-22 NOTE — Telephone Encounter (Signed)
   Primary Cardiologist: Kristeen MissPhilip [name], MD  Chart reviewed as part of pre-operative protocol coverage. Simple dental extractions are considered low risk procedures per guidelines and generally do not require any specific cardiac clearance. It is also generally accepted that for simple extractions, there is no need to interrupt blood thinner therapy (I.e. Plavix), therefore, this can be continued without stopping.  I will route this recommendation to the requesting party via Epic fax function and remove from pre-op pool.  Please call with questions.  Laurann Montanaayna N Dunn, PA-C 06/22/2018, 2:56 PM

## 2018-07-27 ENCOUNTER — Other Ambulatory Visit: Payer: Self-pay | Admitting: Cardiovascular Disease

## 2018-08-11 ENCOUNTER — Other Ambulatory Visit: Payer: Self-pay | Admitting: Cardiovascular Disease

## 2018-10-14 ENCOUNTER — Other Ambulatory Visit: Payer: Self-pay | Admitting: Cardiovascular Disease

## 2018-10-14 DIAGNOSIS — I6523 Occlusion and stenosis of bilateral carotid arteries: Secondary | ICD-10-CM

## 2018-10-22 ENCOUNTER — Ambulatory Visit (HOSPITAL_COMMUNITY)
Admission: RE | Admit: 2018-10-22 | Discharge: 2018-10-22 | Disposition: A | Discharge status: Home / Self Care | Payer: Medicare Other | Source: Ambulatory Visit | Attending: Cardiology | Admitting: Cardiology

## 2018-10-22 DIAGNOSIS — I6523 Occlusion and stenosis of bilateral carotid arteries: Secondary | ICD-10-CM | POA: Diagnosis present

## 2018-11-05 ENCOUNTER — Telehealth: Payer: Self-pay | Admitting: Cardiovascular Disease

## 2018-11-05 NOTE — Telephone Encounter (Signed)
New message      *STAT* If patient is at the pharmacy, call can be transferred to refill team.   1. Which medications need to be refilled? (please list name of each medication and dose if known) amLODipine (NORVASC) 5 MG tablet, pravastatin (PRAVACHOL) 40 MG tablet, clopidogrel (PLAVIX) 75 MG tablet ,   2. Which pharmacy/location (including street and city if local pharmacy) is medication to be sent to?[facility] [address]   3. Do they need a 30 day or 90 day supply? 2 week supply

## 2018-11-06 ENCOUNTER — Telehealth: Payer: Self-pay | Admitting: Cardiovascular Disease

## 2018-11-06 MED ORDER — PRAVASTATIN SODIUM 40 MG PO TABS: 40.0000 mg | ORAL_TABLET | Freq: Every day | ORAL | 0 refills | Status: DC

## 2018-11-06 MED ORDER — AMLODIPINE BESYLATE 5 MG PO TABS: 5.0000 mg | ORAL_TABLET | Freq: Every day | ORAL | 2 refills | Status: DC

## 2018-11-06 MED ORDER — CLOPIDOGREL BISULFATE 75 MG PO TABS: 75.0000 mg | ORAL_TABLET | Freq: Every day | ORAL | 2 refills | Status: DC

## 2018-11-06 NOTE — Telephone Encounter (Signed)
°*  STAT* If patient is at the pharmacy, call can be transferred to refill team.   1. Which medications need to be refilled? (please list name of each medication and dose if known) amlodipine 5 mg pravastatin 40 mg clopidogrel   2. Which pharmacy/location (including street and city if local pharmacy) is medication to be sent to? [facility] [address]  3. Do they need a 30 day or 90 day supply? 90 patient needs a least 2 weeks. Patient appt with Dr. 11/19/18

## 2018-11-06 NOTE — Telephone Encounter (Signed)
Refill sent to the pharmacy electronically.  

## 2018-11-11 ENCOUNTER — Ambulatory Visit: Payer: Medicare Other | Admitting: Cardiovascular Disease

## 2018-11-19 ENCOUNTER — Encounter: Payer: Self-pay | Admitting: Cardiovascular Disease

## 2018-11-19 ENCOUNTER — Ambulatory Visit: Payer: Medicare Other | Admitting: Cardiovascular Disease

## 2018-11-19 VITALS — BP 138/66 | HR 47 | Ht 68.0 in | Wt 177.4 lb

## 2018-11-19 DIAGNOSIS — I2510 Atherosclerotic heart disease of native coronary artery without angina pectoris: Secondary | ICD-10-CM

## 2018-11-19 DIAGNOSIS — E782 Mixed hyperlipidemia: Secondary | ICD-10-CM | POA: Diagnosis not present

## 2018-11-19 LAB — BASIC METABOLIC PANEL
BUN/Creatinine Ratio: 14 (ref 10–24)
BUN: 21 mg/dL (ref 8–27)
CO2: 21 mmol/L (ref 20–29)
Calcium: 9.7 mg/dL (ref 8.6–10.2)
Chloride: 102 mmol/L (ref 96–106)
Creatinine, Ser: 1.45 mg/dL — ABNORMAL HIGH (ref 0.76–1.27)
GFR calc Af Amer: 50 mL/min/{1.73_m2} — ABNORMAL LOW (ref >59)
GFR, EST NON AFRICAN AMERICAN: 43 mL/min/{1.73_m2} — AB (ref >59)
GLUCOSE: 113 mg/dL — AB (ref 65–99)
Potassium: 4.3 mmol/L (ref 3.5–5.2)
Sodium: 140 mmol/L (ref 134–144)

## 2018-11-19 LAB — HEPATIC FUNCTION PANEL
ALT: 25 IU/L (ref 0–44)
AST: 20 IU/L (ref 0–40)
Albumin: 4.2 g/dL (ref 3.5–4.7)
Alkaline Phosphatase: 63 IU/L (ref 39–117)
Bilirubin Total: 0.6 mg/dL (ref 0.0–1.2)
Bilirubin, Direct: 0.16 mg/dL (ref 0.00–0.40)
Total Protein: 6.5 g/dL (ref 6.0–8.5)

## 2018-11-19 LAB — LIPID PANEL
Chol/HDL Ratio: 3.8 ratio (ref 0.0–5.0)
Cholesterol, Total: 136 mg/dL (ref 100–199)
HDL: 36 mg/dL — ABNORMAL LOW (ref >39)
LDL Calculated: 74 mg/dL (ref 0–99)
Triglycerides: 128 mg/dL (ref 0–149)
VLDL CHOLESTEROL CAL: 26 mg/dL (ref 5–40)

## 2018-11-19 MED ORDER — NITROGLYCERIN 0.4 MG SL SUBL: 0.4000 mg | SUBLINGUAL_TABLET | SUBLINGUAL | 5 refills | Status: DC | PRN

## 2018-11-19 NOTE — Patient Instructions (Signed)
Medication Instructions:  Your physician recommends that you continue on your current medications as directed. Please refer to the Current Medication list given to you today.  If you need a refill on your cardiac medications before your next appointment, please call your pharmacy.   Lab work: TODAY - cholesterol, liver panel, basic metabolic panel  If you have labs (blood work) drawn today and your tests are completely normal, you will receive your results only by: . MyChart Message (if you have MyChart) OR . A paper copy in the mail If you have any lab test that is abnormal or we need to change your treatment, we will call you to review the results.  Testing/Procedures: None Ordered   Follow-Up: At [facility], you and your health needs are our priority.  As part of our continuing mission to provide you with exceptional heart care, we have created designated Provider Care Teams.  These Care Teams include your primary Cardiologist (physician) and Advanced Practice Providers (APPs -  Physician Assistants and Nurse Practitioners) who all work together to provide you with the care you need, when you need it. You will need a follow up appointment in:  1 years.  Please call our office 2 months in advance to schedule this appointment.  You may see Philip [name], MD or one of the following Advanced Practice Providers on your designated Care Team: Scott Weaver, PA-C Vin Bhagat, PA-C . Janine Hammond, NP     

## 2018-11-19 NOTE — Progress Notes (Signed)
[name] Date of Birth  Feb 26, 1930  [address]    [address] [place], [place]  [address] [phone]  Problem List: 1. CAD - PCI of RCA March, 2012 2. RBBB 3. Hypothyroidism 4. Carotid bruits 5. hyperlipidemia   Mr. Deperalta is an elderly gentleman with a history of coronary artery disease. He status post PTCA and stenting of the mid-right coronary artery in April of this year. He's done well since I last saw him. He's not had any episodes of chest pain or shortness of breath. He's been tolerating his medications without any difficulty.  No angina. No dyspnea.  Exercising regularly - golf 1 day a week. Works out in [employer] 3 days a week ( including spin class)  Dec. 5, 2016:  Was last seen in 2013. Recently saw [name]. 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  Sharp pain . Has not carried any NTG in years.  Still working out regularly - without any angina .   Works out for 2 hours. Still playing golf several days a week.   Dec. 5, 2017:  Doing well. Played 27 holes of golf yesterday. Goes to the gym 3 days a week - spin class.   Jan. 16, 2019:  [name] is seen back today for follow-up of his coronary artery disease and carotid artery disease.  He had a carotid duplex scan in December, 2018 which reveals moderate bilateral carotid disease.  He will be scheduled to repeat this in 1 year.  Fasting labs recently reveal normal electrolytes and a stable creatinine at 1.29.  His liver enzymes are normal.  Total cholesterol is 126.  His HDL is stable at 30.  LDL is 67.  Triglycerides are 146.  Still exercising at [employer] 3 days a week.   Plays golf regularly  No CP or dyspnea.   Jan. 16 , 2020  [name] is seen today for a 1 year office visit Has a hx of CAD  With PCI of his RCA in March, 2012 Has moderate bilateral carotid artery disease  No syncope Has occasional episodes of upper abdominal /  lower chest pain He thinks its indigestion - is relieved with antiacids He exercises on a regular basis and these pains are not brought on by exercise. Lasts for a few seconds  Goes to [employer] regularly     Current Outpatient Medications on File Prior to Visit  Medication Sig Dispense Refill  . amLODipine (NORVASC) 5 MG tablet Take 1 tablet (5 mg total) by mouth daily. 90 tablet 2  . aspirin 81 MG tablet Take 81 mg by mouth daily.      . clopidogrel (PLAVIX) 75 MG tablet Take 1 tablet (75 mg total) by mouth daily. 90 tablet 2  . fish oil-omega-3 fatty acids 1000 MG capsule Take 2 g by mouth daily.      Marland Kitchen glucosamine-chondroitin 500-400 MG tablet Take 1 tablet by mouth 2 (two) times daily.      Marland Kitchen levothyroxine (SYNTHROID, LEVOTHROID) 88 MCG tablet Take 88 mcg by mouth daily.  3  . Multiple Vitamin (MULTIVITAMIN) tablet Take 1 tablet by mouth daily.      . nitroGLYCERIN (NITROSTAT) 0.4 MG SL tablet Place 1 tablet (0.4 mg total) under the tongue every 5 (five) minutes as needed for chest pain. 25 tablet 5  . pravastatin (PRAVACHOL) 40 MG tablet Take 1 tablet (40 mg total)  by mouth daily. 90 tablet 0   No current facility-administered medications on file prior to visit.     No Known Allergies  Past Medical History:  Diagnosis Date  . Coronary artery disease    PCI, STent to RCA  . Hyperlipidemia   . Thyroid disease    hypothyroidism    Past Surgical History:  Procedure Laterality Date  . APPENDECTOMY    . CARDIAC CATHETERIZATION     2007 occluded ramus inter. , PCI to RCA  . CARPAL TUNNEL RELEASE      Social History   Tobacco Use  Smoking Status Former Smoker  . Last attempt to quit: 11/04/1960  . Years since quitting: 58.0  Smokeless Tobacco Never Used    Social History   Substance and Sexual Activity  Alcohol Use No    Family History  Problem Relation Age of Onset  . Hypertension Mother   . Hypertension Father     Reviw of Systems:  Reviewed in the HPI.   All other systems are negative.  Physical Exam: Blood pressure 138/66, pulse (!) 47, height 5\' 8"  (1.727 m), weight 177 lb 6.4 oz (80.5 kg).  GEN:  Well nourished, well developed in no acute distress HEENT: Normal NECK: No JVD; soft bilateral bruits  LYMPHATICS: No lymphadenopathy CARDIAC: RRR ,  HR is slow  1-2 / 6 systolic murmurs  RESPIRATORY:  Clear to auscultation without rales, wheezing or rhonchi  ABDOMEN: Soft, non-tender, non-distended MUSCULOSKELETAL:  No edema; No deformity  SKIN: Warm and dry NEUROLOGIC:  Alert and oriented x 3   ECG:  Jan. 16, 2020:   Sinus brady at 47.   RBBB   Assessment / Plan:   1. CAD -    Not having any angina pain.  He does have occasional episodes of indigestion which are mostly relieved with antacids.  These indigestion-like pains do not occur with exertion.  I have advised him to pay attention as to what brings these on and what helps them resolve.  We will refill his nitroglycerin. Seen again in 1 year.  2. RBBB - stable   3. Hypothyroidism-followed by Dr. Jeannetta Nap.  4. Carotid bruits -recent carotid duplex scan.  His carotid narrowings are stable.  Will recheck in 1 year.  5. Hyperlipidemia-labs have been stable.  We will recheck a fasting lipids, liver enzymes, basic metabolic profile today.  [name], MD  11/19/2018 8:33 AM    [facility] [address] [place],  [address] [place], [place]  [address] Pager [phone] Phone: [phone]; Fax: [phone]

## 2019-02-08 ENCOUNTER — Other Ambulatory Visit: Payer: Self-pay | Admitting: Cardiovascular Disease

## 2019-02-08 MED ORDER — PRAVASTATIN SODIUM 40 MG PO TABS: 40.0000 mg | ORAL_TABLET | Freq: Every day | ORAL | 2 refills | Status: DC

## 2019-03-16 ENCOUNTER — Other Ambulatory Visit (HOSPITAL_COMMUNITY): Payer: Self-pay | Admitting: Cardiovascular Disease

## 2019-03-16 DIAGNOSIS — I6523 Occlusion and stenosis of bilateral carotid arteries: Secondary | ICD-10-CM

## 2019-08-09 ENCOUNTER — Other Ambulatory Visit: Payer: Self-pay | Admitting: Cardiovascular Disease

## 2019-08-09 MED ORDER — PRAVASTATIN SODIUM 40 MG PO TABS: 40.0000 mg | ORAL_TABLET | Freq: Every day | ORAL | 0 refills | Status: DC

## 2019-08-09 MED ORDER — AMLODIPINE BESYLATE 5 MG PO TABS: 5.0000 mg | ORAL_TABLET | Freq: Every day | ORAL | 0 refills | Status: DC

## 2019-08-09 MED ORDER — CLOPIDOGREL BISULFATE 75 MG PO TABS: 75.0000 mg | ORAL_TABLET | Freq: Every day | ORAL | 0 refills | Status: DC

## 2019-08-09 NOTE — Telephone Encounter (Signed)
Pt's medications were sent to pt's pharmacy as requested. Confirmation received.  

## 2019-10-15 ENCOUNTER — Ambulatory Visit: Payer: Medicare Other | Admitting: Nurse Practitioner

## 2019-10-25 ENCOUNTER — Ambulatory Visit (HOSPITAL_COMMUNITY)
Admission: RE | Admit: 2019-10-25 | Discharge: 2019-10-25 | Disposition: A | Discharge status: Home / Self Care | Payer: Medicare Other | Source: Ambulatory Visit | Attending: Cardiology | Admitting: Cardiology

## 2019-10-25 ENCOUNTER — Other Ambulatory Visit (HOSPITAL_COMMUNITY): Payer: Self-pay | Admitting: Cardiovascular Disease

## 2019-10-25 ENCOUNTER — Other Ambulatory Visit: Payer: Self-pay

## 2019-10-25 DIAGNOSIS — I6523 Occlusion and stenosis of bilateral carotid arteries: Secondary | ICD-10-CM | POA: Insufficient documentation

## 2019-11-08 ENCOUNTER — Other Ambulatory Visit: Payer: Self-pay | Admitting: Cardiovascular Disease

## 2019-11-09 ENCOUNTER — Other Ambulatory Visit: Payer: Self-pay | Admitting: Cardiovascular Disease

## 2019-11-24 ENCOUNTER — Encounter: Payer: Self-pay | Admitting: Cardiovascular Disease

## 2019-11-24 ENCOUNTER — Ambulatory Visit: Payer: Medicare Other | Admitting: Cardiovascular Disease

## 2019-11-24 ENCOUNTER — Other Ambulatory Visit: Payer: Self-pay

## 2019-11-24 VITALS — BP 130/56 | HR 46 | Ht 69.0 in | Wt 179.4 lb

## 2019-11-24 DIAGNOSIS — I2510 Atherosclerotic heart disease of native coronary artery without angina pectoris: Secondary | ICD-10-CM | POA: Diagnosis not present

## 2019-11-24 DIAGNOSIS — E785 Hyperlipidemia, unspecified: Secondary | ICD-10-CM | POA: Diagnosis not present

## 2019-11-24 LAB — BASIC METABOLIC PANEL
BUN/Creatinine Ratio: 13 (ref 10–24)
BUN: 17 mg/dL (ref 8–27)
CO2: 24 mmol/L (ref 20–29)
Calcium: 10.2 mg/dL (ref 8.6–10.2)
Chloride: 103 mmol/L (ref 96–106)
Creatinine, Ser: 1.32 mg/dL — ABNORMAL HIGH (ref 0.76–1.27)
GFR calc Af Amer: 56 mL/min/{1.73_m2} — ABNORMAL LOW (ref >59)
GFR calc non Af Amer: 48 mL/min/{1.73_m2} — ABNORMAL LOW (ref >59)
Glucose: 121 mg/dL — ABNORMAL HIGH (ref 65–99)
Potassium: 4.5 mmol/L (ref 3.5–5.2)
Sodium: 141 mmol/L (ref 134–144)

## 2019-11-24 LAB — HEPATIC FUNCTION PANEL
ALT: 30 IU/L (ref 0–44)
AST: 20 IU/L (ref 0–40)
Albumin: 4.6 g/dL (ref 3.6–4.6)
Alkaline Phosphatase: 71 IU/L (ref 39–117)
Bilirubin Total: 0.5 mg/dL (ref 0.0–1.2)
Bilirubin, Direct: 0.12 mg/dL (ref 0.00–0.40)
Total Protein: 6.7 g/dL (ref 6.0–8.5)

## 2019-11-24 LAB — LIPID PANEL
Chol/HDL Ratio: 4.1 ratio (ref 0.0–5.0)
Cholesterol, Total: 148 mg/dL (ref 100–199)
HDL: 36 mg/dL — ABNORMAL LOW (ref >39)
LDL Chol Calc (NIH): 83 mg/dL (ref 0–99)
Triglycerides: 167 mg/dL — ABNORMAL HIGH (ref 0–149)
VLDL Cholesterol Cal: 29 mg/dL (ref 5–40)

## 2019-11-24 NOTE — Patient Instructions (Signed)
Medication Instructions:  Your physician recommends that you continue on your current medications as directed. Please refer to the Current Medication list given to you today.  *If you need a refill on your cardiac medications before your next appointment, please call your pharmacy*  Lab Work: BMET, Lipid and Liver today  If you have labs (blood work) drawn today and your tests are completely normal, you will receive your results only by: Marland Kitchen MyChart Message (if you have MyChart) OR . A paper copy in the mail If you have any lab test that is abnormal or we need to change your treatment, we will call you to review the results.  Testing/Procedures: None  Follow-Up: At [facility], you and your health needs are our priority.  As part of our continuing mission to provide you with exceptional heart care, we have created designated Provider Care Teams.  These Care Teams include your primary Cardiologist (physician) and Advanced Practice Providers (APPs -  Physician Assistants and Nurse Practitioners) who all work together to provide you with the care you need, when you need it.  Your next appointment:   12 month(s)  The format for your next appointment:   In Person  Provider:   You may see [name], MD or one of the following Advanced Practice Providers on your designated Care Team:    Tereso Newcomer, PA-C  [place]in [place], [place]  Berton Bon, NP   Other Instructions

## 2019-11-24 NOTE — Progress Notes (Signed)
[name] Date of Birth  1929/12/09  [address]    [place], [place]  [address] [phone]  Problem List: 1. CAD - PCI of RCA March, 2012 2. RBBB 3. Hypothyroidism 4. Carotid bruits 5. hyperlipidemia   [name] is an elderly gentleman with a history of coronary artery disease. He status post PTCA and stenting of the mid-right coronary artery in April of this year. He's done well since I last saw him. He's not had any episodes of chest pain or shortness of breath. He's been tolerating his medications without any difficulty.  No angina. No dyspnea.  Exercising regularly - golf 1 day a week. Works out in [employer] 3 days a week ( including spin class)  Dec. 5, 2016:  Was last seen in 2013. Recently saw [name]. 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 Has had some stomach / lower chest pain.  Sharp pain . Has not carried any NTG in years.  Still working out regularly - without any angina .   Works out for 2 hours. Still playing golf several days a week.   Dec. 5, 2017:  Doing well. Played 27 holes of golf yesterday. Goes to the gym 3 days a week - spin class.   Jan. 16, 2019:  [name] is seen back today for follow-up of his coronary artery disease and carotid artery disease.  He had a carotid duplex scan in December, 2018 which reveals moderate bilateral carotid disease.  He will be scheduled to repeat this in 1 year.  Fasting labs recently reveal normal electrolytes and a stable creatinine at 1.29.  His liver enzymes are normal.  Total cholesterol is 126.  His HDL is stable at 30.  LDL is 67.  Triglycerides are 146.  Still exercising at [employer] 3 days a week.   Plays golf regularly  No CP or dyspnea.   Jan. 16 , 2020  [name] is seen today for a 1 year office visit Has a hx of CAD  With PCI of his RCA in March, 2012 Has moderate bilateral carotid artery disease  No syncope Has occasional episodes of upper abdominal /  lower chest pain He thinks its indigestion - is relieved with antiacids He exercises on a regular basis and these pains are not brought on by exercise. Lasts for a few seconds  Goes to [employer] regularly   Jan. 20, 2021:  [name] is seen today for follow up of his CAD.   S/p PCI of his RCA in March , 2012. Carotid duplex scan in Dec. 2020 showed moderate R carotid disease and mild L carotid stenosis.  No CP, no dyspnea.  No syncope or presyncope    Current Outpatient Medications on File Prior to Visit  Medication Sig Dispense Refill  . amLODipine (NORVASC) 5 MG tablet Take 1 tablet (5 mg total) by mouth daily. Please keep upcoming appt in January before anymore refills. Thank you 90 tablet 0  . aspirin 81 MG tablet Take 81 mg by mouth daily.      . clopidogrel (PLAVIX) 75 MG tablet Take 1 tablet (75 mg total) by mouth daily. Pt must keep appt in Jan, 2021 for further refills 60 tablet 0  . fish oil-omega-3 fatty acids 1000 MG capsule Take 2 g by mouth daily.      Marland Kitchen glucosamine-chondroitin 500-400 MG tablet Take 1 tablet by mouth 2 (two) times daily.      Marland Kitchen  levothyroxine (SYNTHROID, LEVOTHROID) 88 MCG tablet Take 88 mcg by mouth daily.  3  . Multiple Vitamin (MULTIVITAMIN) tablet Take 1 tablet by mouth daily.      . nitroGLYCERIN (NITROSTAT) 0.4 MG SL tablet Place 1 tablet (0.4 mg total) under the tongue every 5 (five) minutes as needed for chest pain. 25 tablet 5  . pravastatin (PRAVACHOL) 40 MG tablet Take 1 tablet (40 mg total) by mouth daily. Please keep upcoming appt in January before anymore refills. Thank you 90 tablet 0   No current facility-administered medications on file prior to visit.    No Known Allergies  Past Medical History:  Diagnosis Date  . Coronary artery disease    PCI, STent to RCA  . Hyperlipidemia   . Thyroid disease    hypothyroidism    Past Surgical History:  Procedure Laterality Date  . APPENDECTOMY    . CARDIAC CATHETERIZATION     2007  occluded ramus inter. , PCI to RCA  . CARPAL TUNNEL RELEASE      Social History   Tobacco Use  Smoking Status Former Smoker  . Quit date: 11/04/1960  . Years since quitting: 59.0  Smokeless Tobacco Never Used    Social History   Substance and Sexual Activity  Alcohol Use No    Family History  Problem Relation Age of Onset  . Hypertension Mother   . Hypertension Father     Reviw of Systems:  Reviewed in the HPI.  All other systems are negative.  Physical Exam: There were no vitals taken for this visit.  GEN:  Well nourished, well developed in no acute distress HEENT: Normal NECK: No JVD;   Soft bilateral carotid bruits  LYMPHATICS: No lymphadenopathy CARDIAC: RRR  ,  Soft systolic murmur  RESPIRATORY:  Clear to auscultation without rales, wheezing or rhonchi  ABDOMEN: Soft, non-tender, non-distended MUSCULOSKELETAL:  No edema; No deformity  SKIN: Warm and dry NEUROLOGIC:  Alert and oriented x 3    ECG: *11/24/2019: Sinus bradycardia 46 beats minute. Right bundle branch block. No changes from previous EKGs. Assessment / Plan:   1. CAD -    Doing well.   Still goes to spin class at [employer].   No symptoms .  2. RBBB -  stabel   3. Hypothyroidism-followed by Dr. Jeannetta Daugherty.  4. Carotid bruits -  Has mild - mod carotid disease .  Cont to follow   5. Hyperlipidemia-  Lipids checked today .   LDL is 83,  Trigs are 167.  Gregory Miss, MD  11/24/2019 8:42 AM    [facility] [address] [place],  [address] [place], [place]  [address] Pager [phone] Phone: [phone]; Fax: [phone]

## 2019-12-07 ENCOUNTER — Other Ambulatory Visit: Payer: Self-pay

## 2019-12-08 ENCOUNTER — Encounter: Payer: Self-pay | Admitting: Nurse Practitioner

## 2019-12-08 ENCOUNTER — Ambulatory Visit (INDEPENDENT_AMBULATORY_CARE_PROVIDER_SITE_OTHER): Payer: Medicare Other | Admitting: Nurse Practitioner

## 2019-12-08 VITALS — BP 130/60 | HR 58 | Temp 96.9°F | Ht 69.0 in | Wt 180.2 lb

## 2019-12-08 DIAGNOSIS — E079 Disorder of thyroid, unspecified: Secondary | ICD-10-CM

## 2019-12-08 DIAGNOSIS — I2510 Atherosclerotic heart disease of native coronary artery without angina pectoris: Secondary | ICD-10-CM

## 2019-12-08 DIAGNOSIS — E039 Hypothyroidism, unspecified: Secondary | ICD-10-CM | POA: Diagnosis not present

## 2019-12-08 DIAGNOSIS — R21 Rash and other nonspecific skin eruption: Secondary | ICD-10-CM

## 2019-12-08 DIAGNOSIS — R739 Hyperglycemia, unspecified: Secondary | ICD-10-CM | POA: Diagnosis not present

## 2019-12-08 LAB — TSH: TSH: 4.97 u[IU]/mL — ABNORMAL HIGH (ref 0.35–4.50)

## 2019-12-08 LAB — T4, FREE: Free T4: 0.65 ng/dL (ref 0.60–1.60)

## 2019-12-08 LAB — HEMOGLOBIN A1C: Hgb A1c MFr Bld: 6.2 % (ref 4.6–6.5)

## 2019-12-08 MED ORDER — NITROGLYCERIN 0.4 MG SL SUBL: 0.4000 mg | SUBLINGUAL_TABLET | SUBLINGUAL | 0 refills | Status: DC | PRN

## 2019-12-08 NOTE — Progress Notes (Signed)
Subjective:    Patient ID: [name], male    DOB: [birth year], 86 y.o.   MRN: [id number]  Patient presents today to establish care (new patient), med refill and eval rash.  [name] is here to establish care and for medication refill. His previous pcp: Dr. Arelia Sneddon retired. Last OV was 59months ago. He has medicatl hx of CAD, prediabetes, hyperlipidemia, HTN, and hypothyroidism. He is married and lives with his wife. He is retired. Daily activity includes working in his yard and [employer].  Rash This is a chronic problem. The current episode started more than 1 month ago. The problem has been waxing and waning since onset. The affected locations include the left lower leg. The rash is characterized by itchiness and redness. It is unknown if there was an exposure to a precipitant. Pertinent negatives include no anorexia, fatigue, fever, joint pain or nail changes. Past treatments include topical steroids. The treatment provided mild relief. There is no history of allergies, asthma, eczema or varicella.  hx of multiple tick bites within last 19months.  Depression/Suicide: Depression screen [facility] 2/9 12/08/2019  Decreased Interest 0  Down, Depressed, Hopeless 0  PHQ - 2 Score 0   Immunizations: (TDAP, Hep C screen, Pneumovax, Influenza, zoster)  Health Maintenance  Topic Date Due  . Tetanus Vaccine  11/01/1949  . Pneumonia vaccines (1 of 2 - PCV13) 10/31/1997  . Flu Shot  Completed   Weight:  Wt Readings from Last 3 Encounters:  12/08/19 180 lb 3.2 oz (81.7 kg)  11/24/19 179 lb 6.4 oz (81.4 kg)  11/19/18 177 lb 6.4 oz (80.5 kg)  Fall Risk: Fall Risk  12/08/2019  Falls in the past year? 0   Medications and allergies reviewed with patient and updated if appropriate.  Patient Active Problem List   Diagnosis Date Noted  . Hyperlipidemia 10/09/2015  . Hypothyroidism   . CAD (coronary artery disease) 02/12/2011  . Hypertension 02/12/2011    Current Outpatient Medications on  File Prior to Visit  Medication Sig Dispense Refill  . amLODipine (NORVASC) 5 MG tablet Take 1 tablet (5 mg total) by mouth daily. Please keep upcoming appt in January before anymore refills. Thank you 90 tablet 0  . aspirin 81 MG tablet Take 81 mg by mouth daily.      . clopidogrel (PLAVIX) 75 MG tablet Take 1 tablet (75 mg total) by mouth daily. Pt must keep appt in Jan, 2021 for further refills 60 tablet 0  . fish oil-omega-3 fatty acids 1000 MG capsule Take 2 g by mouth daily.      Marland Kitchen glucosamine-chondroitin 500-400 MG tablet Take 1 tablet by mouth 2 (two) times daily.      . Multiple Vitamin (MULTIVITAMIN) tablet Take 1 tablet by mouth daily.      . pravastatin (PRAVACHOL) 40 MG tablet Take 1 tablet (40 mg total) by mouth daily. Please keep upcoming appt in January before anymore refills. Thank you 90 tablet 0   No current facility-administered medications on file prior to visit.    Past Medical History:  Diagnosis Date  . Coronary artery disease    PCI, STent to RCA  . Hyperlipidemia   . Thyroid disease    hypothyroidism    Past Surgical History:  Procedure Laterality Date  . APPENDECTOMY    . CARDIAC CATHETERIZATION     2007 occluded ramus inter. , PCI to RCA  . CARPAL TUNNEL RELEASE      Social History   Socioeconomic  History  . Marital status: Married    Spouse name: Not on file  . Number of children: Not on file  . Years of education: Not on file  . Highest education level: Not on file  Occupational History  . Not on file  Tobacco Use  . Smoking status: Former Smoker    Quit date: 11/04/1960    Years since quitting: 59.1  . Smokeless tobacco: Never Used  Substance and Sexual Activity  . Alcohol use: No  . Drug use: No  . Sexual activity: Not on file  Other Topics Concern  . Not on file  Social History Narrative  . Not on file   Social Determinants of Health   Financial Resource Strain:   . Difficulty of Paying Living Expenses: Not on file  Food  Insecurity:   . Worried About Programme researcher, broadcasting/film/video in the Last Year: Not on file  . Ran Out of Food in the Last Year: Not on file  Transportation Needs:   . Lack of Transportation (Medical): Not on file  . Lack of Transportation (Non-Medical): Not on file  Physical Activity:   . Days of Exercise per Week: Not on file  . Minutes of Exercise per Session: Not on file  Stress:   . Feeling of Stress : Not on file  Social Connections:   . Frequency of Communication with Friends and Family: Not on file  . Frequency of Social Gatherings with Friends and Family: Not on file  . Attends Religious Services: Not on file  . Active Member of Clubs or Organizations: Not on file  . Attends Banker Meetings: Not on file  . Marital Status: Not on file    Family History  Problem Relation Age of Onset  . Hypertension Mother   . Hypertension Father         Review of Systems  Constitutional: Negative for fatigue and fever.  Gastrointestinal: Negative for anorexia.  Musculoskeletal: Negative for joint pain.  Skin: Positive for rash. Negative for nail changes.    Objective:   Vitals:   12/08/19 0932  BP: 130/60  Pulse: (!) 58  Temp: (!) 96.9 F (36.1 C)  SpO2: 97%    Body mass index is 26.61 kg/m.  Physical Examination:  Physical Exam Vitals reviewed.  Neck:     Thyroid: No thyroid mass, thyromegaly or thyroid tenderness.  Cardiovascular:     Rate and Rhythm: Normal rate and regular rhythm.     Pulses: Normal pulses.     Heart sounds: Normal heart sounds.  Pulmonary:     Effort: Pulmonary effort is normal.     Breath sounds: Normal breath sounds.  Musculoskeletal:     Cervical back: Normal range of motion and neck supple.     Right lower leg: No edema.     Left lower leg: No edema.  Lymphadenopathy:     Cervical: No cervical adenopathy.  Skin:    General: Skin is dry.     Findings: Erythema and rash present. Rash is macular.       Neurological:     Mental  Status: He is alert and oriented to person, place, and time.  Psychiatric:        Mood and Affect: Mood normal.        Behavior: Behavior normal.        Thought Content: Thought content normal.     ASSESSMENT and PLAN: This visit occurred during the SARS-CoV-2 public health emergency.  Safety protocols were in place, including screening questions prior to the visit, additional usage of staff PPE, and extensive cleaning of exam room while observing appropriate contact time as indicated for disinfecting solutions.   [name] was seen today for establish care.  Diagnoses and all orders for this visit:  Hypothyroidism, unspecified type -     TSH -     T4, free -     levothyroxine (SYNTHROID) 88 MCG tablet; Take 1 tablet (88 mcg total) by mouth daily before breakfast.  Rash -     Cancel: [place] IgM Titer -     Rickettsia Typhus Ab, IgG and IgM  Hyperglycemia -     Hemoglobin A1c  Coronary artery disease involving native coronary artery of native heart without angina pectoris -     nitroGLYCERIN (NITROSTAT) 0.4 MG SL tablet; Place 1 tablet (0.4 mg total) under the tongue every 5 (five) minutes as needed for chest pain.   No problem-specific Assessment & Plan notes found for this encounter.     Problem List Items Addressed This Visit      Cardiovascular and Mediastinum   CAD (coronary artery disease)   Relevant Medications   nitroGLYCERIN (NITROSTAT) 0.4 MG SL tablet     Endocrine   Hypothyroidism - Primary   Relevant Medications   levothyroxine (SYNTHROID) 88 MCG tablet   Other Relevant Orders   TSH (Completed)   T4, free (Completed)    Other Visit Diagnoses    Rash       Relevant Orders   Rickettsia Typhus Ab, IgG and IgM   Hyperglycemia       Relevant Orders   Hemoglobin A1c (Completed)      Follow up: Return in about 6 months (around 06/06/2020) for hypothyroidism (F2F or video).  Alysia Penna, NP

## 2019-12-08 NOTE — Patient Instructions (Addendum)
Thank you for Choosing [facility] for your health needs.  Mild elevation in TSH and normal T4. Maintain current dose of levothyroxine. HgbA1c indicates prediabetes at 6.2 Continue DASH diet and f/up in 97months   Please sign medical release to get records from Dr. Jeannetta Nap

## 2019-12-09 ENCOUNTER — Encounter: Payer: Self-pay | Admitting: Nurse Practitioner

## 2019-12-09 DIAGNOSIS — E119 Type 2 diabetes mellitus without complications: Secondary | ICD-10-CM | POA: Insufficient documentation

## 2019-12-09 DIAGNOSIS — R739 Hyperglycemia, unspecified: Secondary | ICD-10-CM | POA: Insufficient documentation

## 2019-12-09 MED ORDER — LEVOTHYROXINE SODIUM 88 MCG PO TABS: 88.0000 ug | ORAL_TABLET | Freq: Every day | ORAL | 1 refills | Status: DC

## 2019-12-10 LAB — RICKETTSIA TYPHUS AB, IGG AND IGM
Typhus IgG Antibodies: NOT DETECTED
Typhus IgM Antibodies: NOT DETECTED

## 2019-12-24 ENCOUNTER — Encounter: Payer: Self-pay | Admitting: Nurse Practitioner

## 2019-12-24 DIAGNOSIS — N183 Chronic kidney disease, stage 3 unspecified: Secondary | ICD-10-CM | POA: Insufficient documentation

## 2020-01-01 ENCOUNTER — Other Ambulatory Visit: Payer: Self-pay | Admitting: Cardiovascular Disease

## 2020-01-09 ENCOUNTER — Encounter: Payer: Self-pay | Admitting: Nurse Practitioner

## 2020-01-11 ENCOUNTER — Encounter: Payer: Self-pay | Admitting: Nurse Practitioner

## 2020-01-12 ENCOUNTER — Encounter: Payer: Self-pay | Admitting: Nurse Practitioner

## 2020-01-12 ENCOUNTER — Ambulatory Visit (INDEPENDENT_AMBULATORY_CARE_PROVIDER_SITE_OTHER): Payer: Medicare Other | Admitting: Nurse Practitioner

## 2020-01-12 ENCOUNTER — Other Ambulatory Visit: Payer: Self-pay

## 2020-01-12 VITALS — HR 59 | Temp 98.3°F | Ht 69.0 in | Wt 178.0 lb

## 2020-01-12 DIAGNOSIS — R238 Other skin changes: Secondary | ICD-10-CM

## 2020-01-12 DIAGNOSIS — L03119 Cellulitis of unspecified part of limb: Secondary | ICD-10-CM

## 2020-01-12 DIAGNOSIS — R21 Rash and other nonspecific skin eruption: Secondary | ICD-10-CM

## 2020-01-12 MED ORDER — VALACYCLOVIR HCL 1 G PO TABS: 1000.0000 mg | ORAL_TABLET | Freq: Three times a day (TID) | ORAL | 0 refills | Status: DC

## 2020-01-12 MED ORDER — CEPHALEXIN 500 MG PO CAPS: 500.0000 mg | ORAL_CAPSULE | Freq: Two times a day (BID) | ORAL | 0 refills | Status: DC

## 2020-01-12 NOTE — Progress Notes (Signed)
Virtual Visit via Video Note  I connected with@ on 01/12/20 at 10:30 AM EST by a video enabled telemedicine application and verified that I am speaking with the correct person using two identifiers.  Location: Patient:Home Provider: Office Participants: patient, wife and provider  I discussed the limitations of evaluation and management by telemedicine and the availability of in person appointments. I also discussed with the patient that there may be a patient responsible charge related to this service. The patient expressed understanding and agreed to proceed.  ZO:[id number] chronic rash on bilateral LE and new rash on right lower back  History of Present Illness: Mr. Aiken reports chronic LE rash (red, dry, itching and painful sometimes), waxing and waning, unknown cause or trigger, no improvement with topical steroid or antifungal, he was evaluated by dermatology over 58yrs ago (does not remember diagnosis given). He states redness worsen over last 1week.  He also reports new rash on right flank region, onset 3days ago, associated with burning sensation, has hx of shingles.   Observations/Objective: Physical Exam  Constitutional: He is oriented to person, place, and time.  Pulmonary/Chest: Effort normal.  Musculoskeletal:        General: No edema.  Neurological: He is alert and oriented to person, place, and time.  Skin: Rash noted. Rash is macular and vesicular. There is erythema.      Assessment and Plan: Brondon was seen today for rash.  Diagnoses and all orders for this visit:  Vesicular rash -     valACYclovir (VALTREX) 1000 MG tablet; Take 1 tablet (1,000 mg total) by mouth 3 (three) times daily.  Cellulitis of lower extremity, unspecified laterality -     cephALEXin (KEFLEX) 500 MG capsule; Take 1 capsule (500 mg total) by mouth 2 (two) times daily.  Rash in adult -     Ambulatory referral to Dermatology    Follow Up Instructions: See avs   I discussed the  assessment and treatment plan with the patient. The patient was provided an opportunity to ask questions and all were answered. The patient agreed with the plan and demonstrated an understanding of the instructions.   The patient was advised to call back or seek an in-person evaluation if the symptoms worsen or if the condition fails to improve as anticipated.  Alysia Penna, NP

## 2020-01-14 ENCOUNTER — Encounter: Payer: Self-pay | Admitting: Nurse Practitioner

## 2020-01-14 ENCOUNTER — Other Ambulatory Visit: Payer: Self-pay | Admitting: Nurse Practitioner

## 2020-01-14 NOTE — Progress Notes (Signed)
possible allergic reaction to keflex and valtrex? Advised to discontinue medications and take benadryl

## 2020-01-17 ENCOUNTER — Encounter: Payer: Self-pay | Admitting: Nurse Practitioner

## 2020-01-17 DIAGNOSIS — L239 Allergic contact dermatitis, unspecified cause: Secondary | ICD-10-CM

## 2020-01-17 MED ORDER — METHYLPREDNISOLONE 4 MG PO TBPK: ORAL_TABLET | ORAL | 0 refills | Status: DC

## 2020-02-03 ENCOUNTER — Ambulatory Visit: Payer: Medicare Other | Attending: Internal Medicine

## 2020-02-03 DIAGNOSIS — Z23 Encounter for immunization: Secondary | ICD-10-CM

## 2020-02-03 NOTE — Progress Notes (Signed)
   Covid-19 Vaccination Clinic  Name:  [name]    MRN: [id number] DOB: 23-Oct-1930  02/03/2020  [name] was observed post Covid-19 immunization for 15 minutes without incident. He was provided with Vaccine Information Sheet and instruction to access the V-Safe system.   [name] was instructed to call 911 with any severe reactions post vaccine: Marland Kitchen Difficulty breathing  . Swelling of face and throat  . A fast heartbeat  . A bad rash all over body  . Dizziness and weakness   Immunizations Administered    Name Date Dose VIS Date Route   Pfizer COVID-19 Vaccine 02/03/2020 10:30 AM 0.3 mL 10/15/2019 Intramuscular   Manufacturer: [employer], [employer]   Lot: [id number]   NDC: [address]-2

## 2020-02-10 ENCOUNTER — Other Ambulatory Visit: Payer: Self-pay | Admitting: Cardiovascular Disease

## 2020-02-18 DIAGNOSIS — I2510 Atherosclerotic heart disease of native coronary artery without angina pectoris: Secondary | ICD-10-CM

## 2020-02-21 MED ORDER — NITROGLYCERIN 0.4 MG SL SUBL: 0.4000 mg | SUBLINGUAL_TABLET | SUBLINGUAL | 6 refills | Status: DC | PRN

## 2020-02-28 ENCOUNTER — Ambulatory Visit: Payer: Medicare Other | Attending: Internal Medicine

## 2020-02-28 DIAGNOSIS — Z23 Encounter for immunization: Secondary | ICD-10-CM

## 2020-02-28 NOTE — Progress Notes (Signed)
   Covid-19 Vaccination Clinic  Name:  [name]    MRN: [id number] DOB: [birth year]  02/28/2020  [name] was observed post Covid-19 immunization for 15 minutes without incident. He was provided with Vaccine Information Sheet and instruction to access the V-Safe system.   [name] was instructed to call 911 with any severe reactions post vaccine: Marland Kitchen Difficulty breathing  . Swelling of face and throat  . A fast heartbeat  . A bad rash all over body  . Dizziness and weakness   Immunizations Administered    Name Date Dose VIS Date Route   Pfizer COVID-19 Vaccine 02/28/2020 11:50 AM 0.3 mL 12/29/2018 Intramuscular   Manufacturer: [employer], [employer]   Lot: [id number]   NDC: [address]-8

## 2020-02-29 ENCOUNTER — Ambulatory Visit: Payer: Medicare Other

## 2020-03-29 ENCOUNTER — Ambulatory Visit: Payer: Medicare Other | Admitting: *Deleted

## 2020-06-06 ENCOUNTER — Ambulatory Visit: Payer: Medicare Other | Admitting: Nurse Practitioner

## 2020-06-24 ENCOUNTER — Other Ambulatory Visit: Payer: Self-pay | Admitting: Nurse Practitioner

## 2020-06-24 DIAGNOSIS — E039 Hypothyroidism, unspecified: Secondary | ICD-10-CM

## 2020-06-26 NOTE — Telephone Encounter (Signed)
Medication refill request.  Refused medication refill at this time. Last TSH was abnormal. Per OV notes on 12/08/19, patient needs 6 mth f/u appt. Patient advised. Appt scheduled 06/28/20 @ 0945 with PCP.

## 2020-06-28 ENCOUNTER — Ambulatory Visit (INDEPENDENT_AMBULATORY_CARE_PROVIDER_SITE_OTHER): Payer: Medicare Other | Admitting: Nurse Practitioner

## 2020-06-28 ENCOUNTER — Encounter: Payer: Self-pay | Admitting: Nurse Practitioner

## 2020-06-28 ENCOUNTER — Other Ambulatory Visit: Payer: Self-pay

## 2020-06-28 VITALS — BP 112/60 | HR 60 | Temp 97.7°F | Ht 69.0 in | Wt 172.0 lb

## 2020-06-28 DIAGNOSIS — E039 Hypothyroidism, unspecified: Secondary | ICD-10-CM

## 2020-06-28 DIAGNOSIS — N1831 Chronic kidney disease, stage 3a: Secondary | ICD-10-CM

## 2020-06-28 LAB — BASIC METABOLIC PANEL
BUN: 16 mg/dL (ref 6–23)
CO2: 27 mEq/L (ref 19–32)
Calcium: 9.4 mg/dL (ref 8.4–10.5)
Chloride: 106 mEq/L (ref 96–112)
Creatinine, Ser: 1.4 mg/dL (ref 0.40–1.50)
GFR: 47.87 mL/min — ABNORMAL LOW (ref >60.00)
Glucose, Bld: 124 mg/dL — ABNORMAL HIGH (ref 70–99)
Potassium: 4.2 mEq/L (ref 3.5–5.1)
Sodium: 140 mEq/L (ref 135–145)

## 2020-06-28 LAB — T4, FREE: Free T4: 0.85 ng/dL (ref 0.60–1.60)

## 2020-06-28 LAB — TSH: TSH: 4.91 u[IU]/mL — ABNORMAL HIGH (ref 0.35–4.50)

## 2020-06-28 NOTE — Assessment & Plan Note (Addendum)
Enlarged thyroid, nontender, no mass Appears euthyroid current use of of levothyroxine Repeat TSH and T4 today. Wt Readings from Last 3 Encounters:  06/28/20 172 lb (78 kg)  01/12/20 178 lb (80.7 kg)  12/08/19 180 lb 3.2 oz (81.7 kg)   BP Readings from Last 3 Encounters:  06/28/20 112/60  12/08/19 130/60  11/24/19 (!) 130/56   Stable renal function Mild elevation in TSH and low T4. Maintain current dose. Ensure levothyroxine is taken on empty stomach and aprt from other medications. Retuent to lab in 96months for repeat thyroid panel

## 2020-06-28 NOTE — Patient Instructions (Addendum)
Go to lab for blood draw Schedule annual f/up with cardiology (11/2020)  Will send medication refill after review of lab results  Schedule nurse visit if you decide to get Pneumonia vaccine (Prevenar 13)  Pneumococcal Conjugate Vaccine (PCV13): What You Need to Know 1. Why get vaccinated? Pneumococcal conjugate vaccine (PCV13) can prevent pneumococcal disease. Pneumococcal disease refers to any illness caused by pneumococcal bacteria. These bacteria can cause many types of illnesses, including pneumonia, which is an infection of the lungs. Pneumococcal bacteria are one of the most common causes of pneumonia. Besides pneumonia, pneumococcal bacteria can also cause:  Ear infections  Sinus infections  Meningitis (infection of the tissue covering the brain and spinal cord)  Bacteremia (bloodstream infection) Anyone can get pneumococcal disease, but children under 42 years of age, people with certain medical conditions, adults 65 years or older, and cigarette smokers are at the highest risk. Most pneumococcal infections are mild. However, some can result in long-term problems, such as brain damage or hearing loss. Meningitis, bacteremia, and pneumonia caused by pneumococcal disease can be fatal. 2. PCV13 PCV13 protects against 13 types of bacteria that cause pneumococcal disease. Infants and young children usually need 4 doses of pneumococcal conjugate vaccine, at 2, 4, 6, and 38-83 months of age. In some cases, a child might need fewer than 4 doses to complete PCV13 vaccination. A dose of PCV23 vaccine is also recommended for anyone 2 years or older with certain medical conditions if they did not already receive PCV13. This vaccine may be given to adults 65 years or older based on discussions between the patient and health care provider. 3. Talk with your health care provider Tell your vaccine provider if the person getting the vaccine:  Has had an allergic reaction after a previous dose of  PCV13, to an earlier pneumococcal conjugate vaccine known as PCV7, or to any vaccine containing diphtheria toxoid (for example, DTaP), or has any severe, life-threatening allergies.  In some cases, your health care provider may decide to postpone PCV13 vaccination to a future visit. People with minor illnesses, such as a cold, may be vaccinated. People who are moderately or severely ill should usually wait until they recover before getting PCV13. Your health care provider can give you more information. 4. Risks of a vaccine reaction  Redness, swelling, pain, or tenderness where the shot is given, and fever, loss of appetite, fussiness (irritability), feeling tired, headache, and chills can happen after PCV13. Young children may be at increased risk for seizures caused by fever after PCV13 if it is administered at the same time as inactivated influenza vaccine. Ask your health care provider for more information. People sometimes faint after medical procedures, including vaccination. Tell your provider if you feel dizzy or have vision changes or ringing in the ears. As with any medicine, there is a very remote chance of a vaccine causing a severe allergic reaction, other serious injury, or death. 5. What if there is a serious problem? An allergic reaction could occur after the vaccinated person leaves the clinic. If you see signs of a severe allergic reaction (hives, swelling of the face and throat, difficulty breathing, a fast heartbeat, dizziness, or weakness), call 9-1-1 and get the person to the nearest hospital. For other signs that concern you, call your health care provider. Adverse reactions should be reported to the Vaccine Adverse Event Reporting System (VAERS). Your health care provider will usually file this report, or you can do it yourself. Visit the VAERS website at  www.vaers.LAgents.no or call (587)598-8147. VAERS is only for reporting reactions, and VAERS staff do not give medical  advice. 6. The National Vaccine Injury Compensation Program The [employer] Vaccine Injury Compensation Program (VICP) is a federal program that was created to compensate people who may have been injured by certain vaccines. Visit the VICP website at SpiritualWord.at or call 681-858-5549 to learn about the program and about filing a claim. There is a time limit to file a claim for compensation. 7. How can I learn more?  Ask your health care provider.  Call your local or state health department.  Contact the Centers for Disease Control and Prevention (CDC): ? Call 248-531-5355 (1-800-CDC-INFO) or ? Visit CDC's website at PicCapture.uy Vaccine Information Statement PCV13 Vaccine (09/02/2018) This information is not intended to replace advice given to you by your health care provider. Make sure you discuss any questions you have with your health care provider. Document Revised: 02/09/2019 Document Reviewed: 06/02/2018 Elsevier Patient Education  2020 [employer].

## 2020-06-28 NOTE — Progress Notes (Addendum)
Subjective:  Patient ID: [name], male    DOB: 1930-10-02  Age: 86 y.o. MRN: [id number]  CC: Follow-up (TSH f/u and medication refill)   HPI  Hypothyroidism Enlarged thyroid, nontender, no mass Appears euthyroid current use of of levothyroxine Repeat TSH and T4 today. Wt Readings from Last 3 Encounters:  06/28/20 172 lb (78 kg)  01/12/20 178 lb (80.7 kg)  12/08/19 180 lb 3.2 oz (81.7 kg)   BP Readings from Last 3 Encounters:  06/28/20 112/60  12/08/19 130/60  11/24/19 (!) 130/56   Stable renal function Mild elevation in TSH and low T4. Maintain current dose. Ensure levothyroxine is taken on empty stomach and aprt from other medications. Retuent to lab in 16months for repeat thyroid panel    Reviewed past Medical, Social and Family history today.  Outpatient Medications Prior to Visit  Medication Sig Dispense Refill  . amLODipine (NORVASC) 5 MG tablet Take 1 tablet (5 mg total) by mouth daily. 90 tablet 2  . aspirin 81 MG tablet Take 81 mg by mouth daily.      . clopidogrel (PLAVIX) 75 MG tablet Take 1 tablet (75 mg total) by mouth daily. 90 tablet 2  . fish oil-omega-3 fatty acids 1000 MG capsule Take 2 g by mouth daily.      Marland Kitchen glucosamine-chondroitin 500-400 MG tablet Take 1 tablet by mouth 2 (two) times daily.      . Multiple Vitamin (MULTIVITAMIN) tablet Take 1 tablet by mouth daily.      . nitroGLYCERIN (NITROSTAT) 0.4 MG SL tablet Place 1 tablet (0.4 mg total) under the tongue every 5 (five) minutes as needed for chest pain. 25 tablet 6  . pravastatin (PRAVACHOL) 40 MG tablet Take 1 tablet (40 mg total) by mouth daily. 90 tablet 2  . levothyroxine (SYNTHROID) 88 MCG tablet Take 1 tablet (88 mcg total) by mouth daily before breakfast. 90 tablet 1  . methylPREDNISolone (MEDROL DOSEPAK) 4 MG TBPK tablet Take as directed on package 21 tablet 0   No facility-administered medications prior to visit.    ROS See HPI  Objective:  BP 112/60  (BP Location: Left Arm, Patient Position: Sitting, Cuff Size: Normal)   Pulse 60   Temp 97.7 F (36.5 C) (Temporal)   Ht 5\' 9"  (1.753 m)   Wt 172 lb (78 kg)   SpO2 99%   BMI 25.40 kg/m   Physical Exam Neck:     Thyroid: Thyromegaly present. No thyroid mass or thyroid tenderness.  Cardiovascular:     Rate and Rhythm: Normal rate and regular rhythm.     Pulses: Normal pulses.     Heart sounds: Murmur heard.      Comments: No LVD Pulmonary:     Effort: Pulmonary effort is normal.     Breath sounds: Normal breath sounds.  Musculoskeletal:     Right lower leg: No edema.     Left lower leg: No edema.  Lymphadenopathy:     Cervical: No cervical adenopathy.  Neurological:     Mental Status: He is alert and oriented to person, place, and time.  Psychiatric:        Mood and Affect: Mood normal.        Behavior: Behavior normal.        Thought Content: Thought content normal.    Assessment & Plan:  This visit occurred during the SARS-CoV-2 public health emergency.  Safety protocols were in place, including screening questions prior to the visit,  additional usage of staff PPE, and extensive cleaning of exam room while observing appropriate contact time as indicated for disinfecting solutions.   [name] was seen today for follow-up.  Diagnoses and all orders for this visit:  Hypothyroidism, unspecified type -     TSH -     T4, free -     levothyroxine (SYNTHROID) 88 MCG tablet; Take 1 tablet (88 mcg total) by mouth daily before breakfast. -     Thyroid Panel With TSH; Future  Stage 3a chronic kidney disease -     Basic metabolic panel    Problem List Items Addressed This Visit      Endocrine   Hypothyroidism - Primary    Enlarged thyroid, nontender, no mass Appears euthyroid current use of of levothyroxine Repeat TSH and T4 today. Wt Readings from Last 3 Encounters:  06/28/20 172 lb (78 kg)  01/12/20 178 lb (80.7 kg)  12/08/19 180 lb 3.2 oz (81.7 kg)   BP  Readings from Last 3 Encounters:  06/28/20 112/60  12/08/19 130/60  11/24/19 (!) 130/56   Stable renal function Mild elevation in TSH and low T4. Maintain current dose. Ensure levothyroxine is taken on empty stomach and aprt from other medications. Retuent to lab in 51months for repeat thyroid panel       Relevant Medications   levothyroxine (SYNTHROID) 88 MCG tablet   Other Relevant Orders   TSH (Completed)   T4, free (Completed)   Thyroid Panel With TSH     Genitourinary   CKD (chronic kidney disease) stage 3, GFR 30-59 ml/min   Relevant Orders   Basic metabolic panel (Completed)      Follow-up: Return in about 6 months (around 12/29/2020) for AWV with wellness coach and hypothyroidism/hyperlipidemia f/up with me (fasting).  Alysia Penna, NP

## 2020-06-29 ENCOUNTER — Encounter: Payer: Self-pay | Admitting: Nurse Practitioner

## 2020-06-29 MED ORDER — LEVOTHYROXINE SODIUM 88 MCG PO TABS: 88.0000 ug | ORAL_TABLET | Freq: Every day | ORAL | 1 refills | Status: DC

## 2020-06-29 NOTE — Addendum Note (Signed)
Addended by: Michaela Corner on: 06/29/2020 08:40 PM   Modules accepted: Orders

## 2020-07-24 ENCOUNTER — Telehealth: Payer: Self-pay | Admitting: Nurse Practitioner

## 2020-07-24 NOTE — Telephone Encounter (Signed)
Left message for patient to schedule Annual Wellness Visit.  Please schedule with Nurse Health Advisor Martha Stanley, RN at [facility]  °

## 2020-07-25 ENCOUNTER — Encounter: Payer: Self-pay | Admitting: Nurse Practitioner

## 2020-08-01 ENCOUNTER — Other Ambulatory Visit: Payer: Self-pay

## 2020-08-01 ENCOUNTER — Ambulatory Visit (INDEPENDENT_AMBULATORY_CARE_PROVIDER_SITE_OTHER): Payer: Medicare Other

## 2020-08-01 VITALS — BP 140/62 | HR 65 | Temp 97.7°F | Resp 16 | Ht 69.0 in | Wt 170.8 lb

## 2020-08-01 DIAGNOSIS — Z23 Encounter for immunization: Secondary | ICD-10-CM

## 2020-08-01 DIAGNOSIS — Z0000 Encounter for general adult medical examination without abnormal findings: Secondary | ICD-10-CM

## 2020-08-01 NOTE — Progress Notes (Addendum)
Subjective:   [name] is a 86 y.o. male who presents for Medicare Annual/Subsequent preventive examination.   Review of Systems     Cardiac Risk Factors include: advanced age (>61men, >90 women);male gender;hypertension;dyslipidemia     Objective:    Today's Vitals   08/01/20 0810  BP: 140/62  Pulse: 65  Resp: 16  Temp: 97.7 F (36.5 C)  TempSrc: Temporal  SpO2: 97%  Weight: 170 lb 12.8 oz (77.5 kg)  Height: 5\' 9"  (1.753 m)   Body mass index is 25.22 kg/m.  Advanced Directives 08/01/2020  Does Patient Have a Medical Advance Directive? Yes  Type of 08/03/2020 of [place];Living will  Copy of Healthcare Power of Attorney in Chart? No - copy requested    Current Medications (verified) Outpatient Encounter Medications as of 08/01/2020  Medication Sig  . amLODipine (NORVASC) 5 MG tablet Take 1 tablet (5 mg total) by mouth daily.  08/03/2020 aspirin 81 MG tablet Take 81 mg by mouth daily.    . clopidogrel (PLAVIX) 75 MG tablet Take 1 tablet (75 mg total) by mouth daily.  . fish oil-omega-3 fatty acids 1000 MG capsule Take 2 g by mouth daily.    . fluocinonide ointment (LIDEX) 0.05 % SMARTSIG:Sparingly Topical Twice Daily PRN  . glucosamine-chondroitin 500-400 MG tablet Take 1 tablet by mouth 2 (two) times daily.    Marland Kitchen levothyroxine (SYNTHROID) 88 MCG tablet Take 1 tablet (88 mcg total) by mouth daily before breakfast.  . Multiple Vitamin (MULTIVITAMIN) tablet Take 1 tablet by mouth daily.    . nitroGLYCERIN (NITROSTAT) 0.4 MG SL tablet Place 1 tablet (0.4 mg total) under the tongue every 5 (five) minutes as needed for chest pain.  . pravastatin (PRAVACHOL) 40 MG tablet Take 1 tablet (40 mg total) by mouth daily.   No facility-administered encounter medications on file as of 08/01/2020.    Allergies (verified) Patient has no known allergies.   History: Past Medical History:  Diagnosis Date  . Coronary artery disease    PCI, STent to RCA  .  Hyperlipidemia   . Thyroid disease    hypothyroidism   Past Surgical History:  Procedure Laterality Date  . APPENDECTOMY    . CARDIAC CATHETERIZATION     2007 occluded ramus inter. , PCI to RCA  . CARPAL TUNNEL RELEASE     Family History  Problem Relation Age of Onset  . Hypertension Mother   . Hypertension Father    Social History   Socioeconomic History  . Marital status: Married    Spouse name: Not on file  . Number of children: Not on file  . Years of education: Not on file  . Highest education level: Not on file  Occupational History  . Occupation: reired  Tobacco Use  . Smoking status: Former Smoker    Quit date: 11/04/1960    Years since quitting: 59.7  . Smokeless tobacco: Never Used  Vaping Use  . Vaping Use: Never used  Substance and Sexual Activity  . Alcohol use: No  . Drug use: No  . Sexual activity: Not on file  Other Topics Concern  . Not on file  Social History Narrative  . Not on file   Social Determinants of Health   Financial Resource Strain: Low Risk   . Difficulty of Paying Living Expenses: Not hard at all  Food Insecurity: No Food Insecurity  . Worried About 01/02/1961 in the Last Year: Never true  .  Ran Out of Food in the Last Year: Never true  Transportation Needs: No Transportation Needs  . Lack of Transportation (Medical): No  . Lack of Transportation (Non-Medical): No  Physical Activity: Sufficiently Active  . Days of Exercise per Week: 3 days  . Minutes of Exercise per Session: 90 min  Stress: No Stress Concern Present  . Feeling of Stress : Not at all  Social Connections: Socially Integrated  . Frequency of Communication with Friends and Family: More than three times a week  . Frequency of Social Gatherings with Friends and Family: Once a week  . Attends Religious Services: More than 4 times per year  . Active Member of Clubs or Organizations: Yes  . Attends BankerClub or Organization Meetings: More than 4 times per year  .  Marital Status: Married    Tobacco Counseling Counseling given: Not Answered   Clinical Intake:  Pre-visit preparation completed: Yes  Pain : No/denies pain     Nutritional Status: BMI 25 -29 Overweight Nutritional Risks: None Diabetes: No  How often do you need to have someone help you when you read instructions, pamphlets, or other written materials from your doctor or pharmacy?: 1 - Never What is the last grade level you completed in school?: 12th grade  Diabetic?No  Interpreter Needed?: No  Information entered by :: Gregory SalesMartha Beronica [place] LPN   Activities of Daily Living In your present state of health, do you have any difficulty performing the following activities: 08/01/2020  Hearing? N  Vision? N  Difficulty concentrating or making decisions? Y  Comment occasionally loses train of thought  Walking or climbing stairs? N  Dressing or bathing? N  Doing errands, shopping? N  Preparing Food and eating ? N  Using the Toilet? N  In the past six months, have you accidently leaked urine? N  Do you have problems with loss of bowel control? Y  Comment only while taking prednison recently.  Managing your Medications? N  Managing your Finances? N  Housekeeping or managing your Housekeeping? N  Some recent data might be hidden    Patient Care Team: Nche, Bonna Gainsharlotte Lum, NP as PCP - General (Internal Medicine) [name], Deloris PingPhilip J, MD as PCP - Cardiology (Cardiology)  Indicate any recent Medical Services you may have received from other than [facility] providers in the past year (date may be approximate).     Assessment:   This is a routine wellness examination for [name].  Hearing/Vision screen  Hearing Screening   125Hz  250Hz  500Hz  1000Hz  2000Hz  3000Hz  4000Hz  6000Hz  8000Hz   Right ear:           Left ear:           Comments: No issues  Vision Screening Comments: Wears glasses Last eye exam-01/2020 [facility]  Dietary issues and exercise activities discussed: Current  Exercise Habits: Structured exercise class, Type of exercise: Other - see comments (spin class), Time (Minutes): 60, Frequency (Times/Week): 3, Weekly Exercise (Minutes/Week): 180, Intensity: Moderate, Exercise limited by: None identified  Goals    . Patient Stated     Maintain current level of activity      Depression Screen PHQ 2/9 Scores 08/01/2020 12/08/2019  PHQ - 2 Score 0 0    Fall Risk Fall Risk  08/01/2020 12/08/2019  Falls in the past year? 0 0  Number falls in past yr: 0 -  Injury with Fall? 0 -  Follow up Falls prevention discussed -    Any stairs in or around the home?  No  Home free of loose throw rugs in walkways, pet beds, electrical cords, etc? Yes  Adequate lighting in your home to reduce risk of falls? Yes   ASSISTIVE DEVICES UTILIZED TO PREVENT FALLS:  Life alert? No  Use of a cane, walker or w/c? No  Grab bars in the bathroom? Yes  Shower chair or bench in shower? No  Elevated toilet seat or a handicapped toilet? No   TIMED UP AND GO:  Was the test performed? Yes    Cognitive Function:No cognitive impairment noted     6CIT Screen 08/01/2020  What Year? 0 points  What month? 0 points  What time? 0 points  Count back from 20 0 points  Months in reverse 2 points  Repeat phrase 4 points  Total Score 6    Immunizations Immunization History  Administered Date(s) Administered  . Fluad Quad(high Dose 65+) 08/01/2020  . Influenza-Unspecified 09/07/2018, 08/05/2019  . PFIZER SARS-COV-2 Vaccination 02/03/2020, 02/28/2020  . Pneumococcal Conjugate-13 08/01/2020    TDAP status: Due, Education has been provided regarding the importance of this vaccine. Advised may receive this vaccine at local pharmacy or Health Dept. Aware to provide a copy of the vaccination record if obtained from local pharmacy or Health Dept. Verbalized acceptance and understanding.   Flu Vaccine status: Completed at today's visit   Pneumococcal vaccine status: Completed during  today's visit.   Covid-19 vaccine status: Completed vaccines  Qualifies for Shingles Vaccine? Yes   Zostavax completed No   Shingrix Completed?: No.    Education has been provided regarding the importance of this vaccine. Patient has been advised to call insurance company to determine out of pocket expense if they have not yet received this vaccine. Advised may also receive vaccine at local pharmacy or Health Dept. Verbalized acceptance and understanding.  Screening Tests Health Maintenance  Topic Date Due  . TETANUS/TDAP  Never done  . PNA vac Low Risk Adult (2 of 2 - PPSV23) 08/01/2021  . INFLUENZA VACCINE  Completed  . COVID-19 Vaccine  Completed    Health Maintenance  Health Maintenance Due  Topic Date Due  . TETANUS/TDAP  Never done    Colorectal cancer screening: No longer required.   Lung Cancer Screening: (Low Dose CT Chest recommended if Age 36-80 years, 30 pack-year currently smoking OR have quit w/in 15years.) does not qualify.     Additional Screening:  Hepatitis C Screening: does not qualify.  Vision Screening: Recommended annual ophthalmology exams for early detection of glaucoma and other disorders of the eye. Is the patient up to date with their annual eye exam?  Yes  Who is the provider or what is the name of the office in which the patient attends annual eye exams? Dr. Hyacinth Meeker   Dental Screening: Recommended annual dental exams for proper oral hygiene  Community Resource Referral / Chronic Care Management: CRR required this visit?  No   CCM required this visit?  No      Plan:     I have personally reviewed and noted the following in the patient's chart:   . Medical and social history . Use of alcohol, tobacco or illicit drugs  . Current medications and supplements . Functional ability and status . Nutritional status . Physical activity . Advanced directives . List of other physicians . Hospitalizations, surgeries, and ER visits in previous  12 months . Vitals . Screenings to include cognitive, depression, and falls . Referrals and appointments  In addition, I have reviewed and  discussed with patient certain preventive protocols, quality metrics, and best practice recommendations. A written personalized care plan for preventive services as well as general preventive health recommendations were provided to patient.    Roanna Raider, LPN   02/04/4741  Nurse Health Advisor  Nurse Notes: None

## 2020-08-01 NOTE — Patient Instructions (Addendum)
[name] , Thank you for taking time to come for your Medicare Wellness Visit. I appreciate your ongoing commitment to your health goals. Please review the following plan we discussed and let me know if I can assist you in the future.   Screening recommendations/referrals: Colonoscopy: No longer indicated Recommended yearly ophthalmology/optometry visit for glaucoma screening and checkup Recommended yearly dental visit for hygiene and checkup  Vaccinations: Influenza vaccine: Up to Date- Administered today. Pneumococcal vaccine: Prevnar-13 administered today. Pneumovax-23 due 08/01/21 Tdap vaccine: Discuss with pharmacy Shingles vaccine: Discuss with pharmacy   Covid-19: Completed vaccines  Advanced directives: Please bring a copy for your chart  Conditions/risks identified: See problem list  Next appointment: Follow up in one year for your annual wellness visit. 08/02/2021 @ 9:00am  Preventive Care 84 Years and Older, Male Preventive care refers to lifestyle choices and visits with your health care provider that can promote health and wellness. What does preventive care include?  A yearly physical exam. This is also called an annual well check.  Dental exams once or twice a year.  Routine eye exams. Ask your health care provider how often you should have your eyes checked.  Personal lifestyle choices, including:  Daily care of your teeth and gums.  Regular physical activity.  Eating a healthy diet.  Avoiding tobacco and drug use.  Limiting alcohol use.  Practicing safe sex.  Taking low doses of aspirin every day.  Taking vitamin and mineral supplements as recommended by your health care provider. What happens during an annual well check? The services and screenings done by your health care provider during your annual well check will depend on your age, overall health, lifestyle risk factors, and family history of disease. Counseling  Your health care provider may  ask you questions about your:  Alcohol use.  Tobacco use.  Drug use.  Emotional well-being.  Home and relationship well-being.  Sexual activity.  Eating habits.  History of falls.  Memory and ability to understand (cognition).  Work and work Astronomer. Screening  You may have the following tests or measurements:  Height, weight, and BMI.  Blood pressure.  Lipid and cholesterol levels. These may be checked every 5 years, or more frequently if you are over 67 years old.  Skin check.  Lung cancer screening. You may have this screening every year starting at age 44 if you have a 30-pack-year history of smoking and currently smoke or have quit within the past 15 years.  Fecal occult blood test (FOBT) of the stool. You may have this test every year starting at age 10.  Flexible sigmoidoscopy or colonoscopy. You may have a sigmoidoscopy every 5 years or a colonoscopy every 10 years starting at age 76.  Prostate cancer screening. Recommendations will vary depending on your family history and other risks.  Hepatitis C blood test.  Hepatitis B blood test.  Sexually transmitted disease (STD) testing.  Diabetes screening. This is done by checking your blood sugar (glucose) after you have not eaten for a while (fasting). You may have this done every 1-3 years.  Abdominal aortic aneurysm (AAA) screening. You may need this if you are a current or former smoker.  Osteoporosis. You may be screened starting at age 40 if you are at high risk. Talk with your health care provider about your test results, treatment options, and if necessary, the need for more tests. Vaccines  Your health care provider may recommend certain vaccines, such as:  Influenza vaccine. This is recommended  every year.  Tetanus, diphtheria, and acellular pertussis (Tdap, Td) vaccine. You may need a Td booster every 10 years.  Zoster vaccine. You may need this after age 33.  Pneumococcal 13-valent  conjugate (PCV13) vaccine. One dose is recommended after age 73.  Pneumococcal polysaccharide (PPSV23) vaccine. One dose is recommended after age 70. Talk to your health care provider about which screenings and vaccines you need and how often you need them. This information is not intended to replace advice given to you by your health care provider. Make sure you discuss any questions you have with your health care provider. Document Released: 11/17/2015 Document Revised: 07/10/2016 Document Reviewed: 08/22/2015 Elsevier Interactive Patient Education  2017 [place] Prevention in the Home Falls can cause injuries. They can happen to people of all ages. There are many things you can do to make your home safe and to help prevent falls. What can I do on the outside of my home?  Regularly fix the edges of walkways and driveways and fix any cracks.  Remove anything that might make you trip as you walk through a door, such as a raised step or threshold.  Trim any bushes or trees on the path to your home.  Use bright outdoor lighting.  Clear any walking paths of anything that might make someone trip, such as rocks or tools.  Regularly check to see if handrails are loose or broken. Make sure that both sides of any steps have handrails.  Any raised decks and porches should have guardrails on the edges.  Have any leaves, snow, or ice cleared regularly.  Use sand or salt on walking paths during winter.  Clean up any spills in your garage right away. This includes oil or grease spills. What can I do in the bathroom?  Use night lights.  Install grab bars by the toilet and in the tub and shower. Do not use towel bars as grab bars.  Use non-skid mats or decals in the tub or shower.  If you need to sit down in the shower, use a plastic, non-slip stool.  Keep the floor dry. Clean up any water that spills on the floor as soon as it happens.  Remove soap buildup in the tub or  shower regularly.  Attach bath mats securely with double-sided non-slip rug tape.  Do not have throw rugs and other things on the floor that can make you trip. What can I do in the bedroom?  Use night lights.  Make sure that you have a light by your bed that is easy to reach.  Do not use any sheets or blankets that are too big for your bed. They should not hang down onto the floor.  Have a firm chair that has side arms. You can use this for support while you get dressed.  Do not have throw rugs and other things on the floor that can make you trip. What can I do in the kitchen?  Clean up any spills right away.  Avoid walking on wet floors.  Keep items that you use a lot in easy-to-reach places.  If you need to reach something above you, use a strong step stool that has a grab bar.  Keep electrical cords out of the way.  Do not use floor polish or wax that makes floors slippery. If you must use wax, use non-skid floor wax.  Do not have throw rugs and other things on the floor that can make you trip. What  can I do with my stairs?  Do not leave any items on the stairs.  Make sure that there are handrails on both sides of the stairs and use them. Fix handrails that are broken or loose. Make sure that handrails are as long as the stairways.  Check any carpeting to make sure that it is firmly attached to the stairs. Fix any carpet that is loose or worn.  Avoid having throw rugs at the top or bottom of the stairs. If you do have throw rugs, attach them to the floor with carpet tape.  Make sure that you have a light switch at the top of the stairs and the bottom of the stairs. If you do not have them, ask someone to add them for you. What else can I do to help prevent falls?  Wear shoes that:  Do not have high heels.  Have rubber bottoms.  Are comfortable and fit you well.  Are closed at the toe. Do not wear sandals.  If you use a stepladder:  Make sure that it is fully  opened. Do not climb a closed stepladder.  Make sure that both sides of the stepladder are locked into place.  Ask someone to hold it for you, if possible.  Clearly mark and make sure that you can see:  Any grab bars or handrails.  First and last steps.  Where the edge of each step is.  Use tools that help you move around (mobility aids) if they are needed. These include:  Canes.  Walkers.  Scooters.  Crutches.  Turn on the lights when you go into a dark area. Replace any light bulbs as soon as they burn out.  Set up your furniture so you have a clear path. Avoid moving your furniture around.  If any of your floors are uneven, fix them.  If there are any pets around you, be aware of where they are.  Review your medicines with your doctor. Some medicines can make you feel dizzy. This can increase your chance of falling. Ask your doctor what other things that you can do to help prevent falls. This information is not intended to replace advice given to you by your health care provider. Make sure you discuss any questions you have with your health care provider. Document Released: 08/17/2009 Document Revised: 03/28/2016 Document Reviewed: 11/25/2014 Elsevier Interactive Patient Education  2017 [employer].

## 2020-09-29 ENCOUNTER — Other Ambulatory Visit: Payer: Self-pay | Admitting: Cardiovascular Disease

## 2020-10-13 ENCOUNTER — Other Ambulatory Visit: Payer: Medicare Other

## 2020-10-13 DIAGNOSIS — Z20822 Contact with and (suspected) exposure to covid-19: Secondary | ICD-10-CM

## 2020-10-15 LAB — NOVEL CORONAVIRUS, NAA: SARS-CoV-2, NAA: DETECTED — AB

## 2020-10-15 LAB — SARS-COV-2, NAA 2 DAY TAT

## 2020-10-16 ENCOUNTER — Telehealth (HOSPITAL_COMMUNITY): Payer: Self-pay | Admitting: *Deleted

## 2020-10-16 ENCOUNTER — Telehealth: Payer: Self-pay | Admitting: Nurse Practitioner

## 2020-10-16 DIAGNOSIS — U071 COVID-19: Secondary | ICD-10-CM

## 2020-10-16 DIAGNOSIS — R059 Cough, unspecified: Secondary | ICD-10-CM

## 2020-10-16 MED ORDER — BENZONATATE 100 MG PO CAPS: 100.0000 mg | ORAL_CAPSULE | Freq: Three times a day (TID) | ORAL | 0 refills | Status: DC | PRN

## 2020-10-16 NOTE — Telephone Encounter (Signed)
Called to Discuss with patient about Covid symptoms and the use of the monoclonal antibody infusion for those with mild to moderate Covid symptoms and at a high risk of hospitalization.     Pt is qualified for this infusion due to co-morbid conditions and/or a member of an at-risk group.     Patient Active Problem List   Diagnosis Date Noted   CKD (chronic kidney disease) stage 3, GFR 30-59 ml/min (HCC) 12/24/2019   Hyperglycemia 12/09/2019   Hyperlipidemia 10/09/2015   Hypothyroidism    CAD (coronary artery disease) 02/12/2011   Hypertension 02/12/2011    Patient declines infusion at this time. Symptoms tier reviewed as well as criteria for ending isolation. Preventative practices reviewed. Patient verbalized understanding.    Patient advised to call back if he/she decides that he/she does want to get infusion. Callback number to the infusion center given. Patient advised to go to Urgent care or ED with severe symptoms.

## 2020-10-16 NOTE — Telephone Encounter (Signed)
Benzonatate sent for cough. She can also use coricidin HBP OTC for congestion. She will need to schedule a video appt if no improvement.

## 2020-10-16 NOTE — Telephone Encounter (Signed)
Patient is calling to let his provider know that he has tested positive for COVID, does not want to be seen, but needs something for cough and congestion. He said that he could do a telephone visit (if required). Please advise.   [facility] Patients contact info: [phone]

## 2020-10-16 NOTE — Telephone Encounter (Signed)
Please advise 

## 2020-10-17 ENCOUNTER — Ambulatory Visit (HOSPITAL_COMMUNITY)
Admission: RE | Admit: 2020-10-17 | Discharge: 2020-10-17 | Disposition: A | Discharge status: Home / Self Care | Payer: Medicare Other | Source: Ambulatory Visit | Attending: Pulmonary Disease | Admitting: Pulmonary Disease

## 2020-10-17 ENCOUNTER — Other Ambulatory Visit: Payer: Self-pay | Admitting: Physician Assistant

## 2020-10-17 DIAGNOSIS — U071 COVID-19: Secondary | ICD-10-CM

## 2020-10-17 DIAGNOSIS — I2510 Atherosclerotic heart disease of native coronary artery without angina pectoris: Secondary | ICD-10-CM | POA: Insufficient documentation

## 2020-10-17 DIAGNOSIS — N183 Chronic kidney disease, stage 3 unspecified: Secondary | ICD-10-CM

## 2020-10-17 DIAGNOSIS — R54 Age-related physical debility: Secondary | ICD-10-CM | POA: Insufficient documentation

## 2020-10-17 DIAGNOSIS — Z23 Encounter for immunization: Secondary | ICD-10-CM | POA: Insufficient documentation

## 2020-10-17 MED ORDER — METHYLPREDNISOLONE SODIUM SUCC 125 MG IJ SOLR: 125.0000 mg | Freq: Once | INTRAMUSCULAR | Status: DC | PRN

## 2020-10-17 MED ORDER — DIPHENHYDRAMINE HCL 50 MG/ML IJ SOLN: 50.0000 mg | Freq: Once | INTRAMUSCULAR | Status: DC | PRN

## 2020-10-17 MED ORDER — ALBUTEROL SULFATE HFA 108 (90 BASE) MCG/ACT IN AERS: 2.0000 | INHALATION_SPRAY | Freq: Once | RESPIRATORY_TRACT | Status: DC | PRN

## 2020-10-17 MED ORDER — SODIUM CHLORIDE 0.9 % IV SOLN: INTRAVENOUS | Status: DC | PRN

## 2020-10-17 MED ORDER — SODIUM CHLORIDE 0.9 % IV SOLN: Freq: Once | INTRAVENOUS | Status: AC

## 2020-10-17 MED ORDER — FAMOTIDINE IN NACL 20-0.9 MG/50ML-% IV SOLN: 20.0000 mg | Freq: Once | INTRAVENOUS | Status: DC | PRN

## 2020-10-17 MED ORDER — EPINEPHRINE 0.3 MG/0.3ML IJ SOAJ: 0.3000 mg | Freq: Once | INTRAMUSCULAR | Status: DC | PRN

## 2020-10-17 NOTE — Telephone Encounter (Signed)
Patient notified and verbalized understanding. 

## 2020-10-17 NOTE — Discharge Instructions (Signed)
10 Things You Can Do to Manage Your COVID-19 Symptoms at Home If you have possible or confirmed COVID-19: 1. Stay home from work and school. And stay away from other public places. If you must go out, avoid using any kind of public transportation, ridesharing, or taxis. 2. Monitor your symptoms carefully. If your symptoms get worse, call your healthcare provider immediately. 3. Get rest and stay hydrated. 4. If you have a medical appointment, call the healthcare provider ahead of time and tell them that you have or may have COVID-19. 5. For medical emergencies, call 911 and notify the dispatch personnel that you have or may have COVID-19. 6. Cover your cough and sneezes with a tissue or use the inside of your elbow. 7. Wash your hands often with soap and water for at least 20 seconds or clean your hands with an alcohol-based hand sanitizer that contains at least 60% alcohol. 8. As much as possible, stay in a specific room and away from other people in your home. Also, you should use a separate bathroom, if available. If you need to be around other people in or outside of the home, wear a mask. 9. Avoid sharing personal items with other people in your household, like dishes, towels, and bedding. 10. Clean all surfaces that are touched often, like counters, tabletops, and doorknobs. Use household cleaning sprays or wipes according to the label instructions. cdc.gov/coronavirus 05/05/2019 This information is not intended to replace advice given to you by your health care provider. Make sure you discuss any questions you have with your health care provider. Document Revised: 10/07/2019 Document Reviewed: 10/07/2019 Elsevier Patient Education  2020 Elsevier Inc. What types of side effects do monoclonal antibody drugs cause?  Common side effects  In general, the more common side effects caused by monoclonal antibody drugs include: . Allergic reactions, such as hives or itching . Flu-like signs and  symptoms, including chills, fatigue, fever, and muscle aches and pains . Nausea, vomiting . Diarrhea . Skin rashes . Low blood pressure   The CDC is recommending patients who receive monoclonal antibody treatments wait at least 90 days before being vaccinated.  Currently, there are no data on the safety and efficacy of mRNA COVID-19 vaccines in persons who received monoclonal antibodies or convalescent plasma as part of COVID-19 treatment. Based on the estimated half-life of such therapies as well as evidence suggesting that reinfection is uncommon in the 90 days after initial infection, vaccination should be deferred for at least 90 days, as a precautionary measure until additional information becomes available, to avoid interference of the antibody treatment with vaccine-induced immune responses. If you have any questions or concerns after the infusion please call the Advanced Practice Provider on call at [phone]. This number is ONLY intended for your use regarding questions or concerns about the infusion post-treatment side-effects.  Please do not provide this number to others for use. For return to work notes please contact your primary care provider.   If someone you know is interested in receiving treatment please have them call the COVID hotline at [phone].   

## 2020-10-17 NOTE — Progress Notes (Signed)
I connected by phone with [name] on 10/17/2020 at 4:36 PM to discuss the potential use of a new treatment for mild to moderate COVID-19 viral infection in non-hospitalized patients.  This patient is a 86 y.o. male that meets the FDA criteria for Emergency Use Authorization of COVID monoclonal antibody sotrovimab, casirivimab/imdevimab or bamlamivimab/estevimab.  Has a (+) direct SARS-CoV-2 viral test result  Has mild or moderate COVID-19   Is NOT hospitalized due to COVID-19  Is within 10 days of symptom onset  Has at least one of the high risk factor(s) for progression to severe COVID-19 and/or hospitalization as defined in EUA.  Specific high risk criteria : Older age (>/= 84 yo), Chronic Kidney Disease (CKD) and Cardiovascular disease or hypertension   I have spoken and communicated the following to the patient or parent/caregiver regarding COVID monoclonal antibody treatment:  1. FDA has authorized the emergency use for the treatment of mild to moderate COVID-19 in adults and pediatric patients with positive results of direct SARS-CoV-2 viral testing who are 10 years of age and older weighing at least 40 kg, and who are at high risk for progressing to severe COVID-19 and/or hospitalization.  2. The significant known and potential risks and benefits of COVID monoclonal antibody, and the extent to which such potential risks and benefits are unknown.  3. Information on available alternative treatments and the risks and benefits of those alternatives, including clinical trials.  4. Patients treated with COVID monoclonal antibody should continue to self-isolate and use infection control measures (e.g., wear mask, isolate, social distance, avoid sharing personal items, clean and disinfect "high touch" surfaces, and frequent handwashing) according to CDC guidelines.   5. The patient or parent/caregiver has the option to accept or refuse COVID monoclonal antibody  treatment.  After reviewing this information with the patient, the patient has agreed to receive one of the available covid 19 monoclonal antibodies and will be provided an appropriate fact sheet prior to infusion.  Sx onset 12/8. Set up for infusion on 12/14 @ 5pm. Directions given to [facility]. Pt is aware that insurance will be charged an infusion fee. Pt is fully vaccinated.   Cline Crock 10/17/2020 4:36 PM

## 2020-10-17 NOTE — Progress Notes (Addendum)
Patient reviewed Fact Sheet for Patients, Parents, and Caregivers for Emergency Use Authorization (EUA) of  Bamlanivimab & Etesevimab for the Treatment of Coronavirus. Patient also reviewed and is agreeable to the estimated cost of treatment. Patient is agreeable to proceed.   

## 2020-10-17 NOTE — Progress Notes (Signed)
  Diagnosis: COVID-19  Physician: Dr. Patrick Wright  Procedure: [facility] Infusion [facility] Med: bamlanivimab\etesevimab infusion - Provided patient with bamlanimivab\etesevimab fact sheet for patients, parents and caregivers prior to infusion.  Complications: No immediate complications noted.  Discharge: Discharged home   [name] 10/17/2020   

## 2020-10-24 ENCOUNTER — Ambulatory Visit (HOSPITAL_COMMUNITY): Payer: Medicare Other

## 2020-10-24 ENCOUNTER — Encounter: Payer: Self-pay | Admitting: Family

## 2020-10-24 ENCOUNTER — Telehealth (INDEPENDENT_AMBULATORY_CARE_PROVIDER_SITE_OTHER): Payer: Medicare Other | Admitting: Family

## 2020-10-24 VITALS — BP 147/62 | HR 56 | Temp 98.4°F | Ht 69.0 in

## 2020-10-24 DIAGNOSIS — U071 COVID-19: Secondary | ICD-10-CM | POA: Diagnosis not present

## 2020-10-24 DIAGNOSIS — R059 Cough, unspecified: Secondary | ICD-10-CM | POA: Diagnosis not present

## 2020-10-24 DIAGNOSIS — I2510 Atherosclerotic heart disease of native coronary artery without angina pectoris: Secondary | ICD-10-CM | POA: Diagnosis not present

## 2020-10-24 MED ORDER — PREDNISONE 20 MG PO TABS: 20.0000 mg | ORAL_TABLET | Freq: Two times a day (BID) | ORAL | 0 refills | Status: DC

## 2020-10-24 MED ORDER — AZITHROMYCIN 250 MG PO TABS
250.0000 mg | ORAL_TABLET | Freq: Every day | ORAL | 0 refills | Status: DC
Start: 2020-10-24 — End: 2020-11-14

## 2020-10-24 NOTE — Progress Notes (Signed)
Virtual Visit via Telephone Note  I connected with [name] on 10/24/20 at  9:40 AM EST by telephone and verified that I am speaking with the correct person using two identifiers.  Location: Patient:[name] Provider: Worthy Rancher, NP   I discussed the limitations, risks, security and privacy concerns of performing an evaluation and management service by telephone and the availability of in person appointments. I also discussed with the patient that there may be a patient responsible charge related to this service. The patient expressed understanding and agreed to proceed.   History of Present Illness: 86 year old male presents via phonecall with concerns of cough that is productive x 2 weeks without much improvement. He and his wife were diagnosed with COVID-19 on 12/10 and received monoclonal antibody treatment. He is concerned that he still has a lot of sputum coming up. No fever, SOB, or weakness    Observations/Objective: A&O, NAD, speaking well. No difficulty breathing. Cough sounds wet.   Assessment and Plan:[name] was seen today for cough.  Diagnoses and all orders for this visit:  Cough  COVID-19  Other orders -     predniSONE (DELTASONE) 20 MG tablet; Take 1 tablet (20 mg total) by mouth 2 (two) times daily with a meal. -     azithromycin (ZITHROMAX Z-PAK) 250 MG tablet; Take 1 tablet (250 mg total) by mouth daily. 2 tabs today, then 1 tab daily     Follow Up Instructions:Call the office with questions or concerns. Consider xray if not improving in 2 days.     I discussed the assessment and treatment plan with the patient. The patient was provided an opportunity to ask questions and all were answered. The patient agreed with the plan and demonstrated an understanding of the instructions.   The patient was advised to call back or seek an in-person evaluation if the symptoms worsen or if the condition fails to improve as anticipated.  I provided 15 minutes  of non-face-to-face time during this encounter.   Eulis Foster, FNP

## 2020-10-31 ENCOUNTER — Ambulatory Visit (HOSPITAL_COMMUNITY): Payer: Medicare Other

## 2020-11-06 ENCOUNTER — Other Ambulatory Visit: Payer: Self-pay | Admitting: Cardiovascular Disease

## 2020-11-06 ENCOUNTER — Other Ambulatory Visit: Payer: Self-pay

## 2020-11-06 ENCOUNTER — Other Ambulatory Visit (HOSPITAL_COMMUNITY): Payer: Self-pay | Admitting: Cardiovascular Disease

## 2020-11-06 ENCOUNTER — Ambulatory Visit (HOSPITAL_COMMUNITY)
Admission: RE | Admit: 2020-11-06 | Discharge: 2020-11-06 | Disposition: A | Discharge status: Home / Self Care | Payer: Medicare Other | Source: Ambulatory Visit | Attending: Cardiology | Admitting: Cardiology

## 2020-11-06 DIAGNOSIS — I6523 Occlusion and stenosis of bilateral carotid arteries: Secondary | ICD-10-CM | POA: Insufficient documentation

## 2020-11-13 ENCOUNTER — Other Ambulatory Visit: Payer: Self-pay

## 2020-11-14 ENCOUNTER — Ambulatory Visit: Payer: Medicare Other | Admitting: Nurse Practitioner

## 2020-11-14 ENCOUNTER — Encounter: Payer: Self-pay | Admitting: Nurse Practitioner

## 2020-11-14 VITALS — BP 120/64 | HR 60 | Temp 97.3°F | Ht 69.0 in | Wt 171.8 lb

## 2020-11-14 DIAGNOSIS — R21 Rash and other nonspecific skin eruption: Secondary | ICD-10-CM | POA: Diagnosis not present

## 2020-11-14 MED ORDER — HYDROXYZINE HCL 25 MG PO TABS: 25.0000 mg | ORAL_TABLET | Freq: Two times a day (BID) | ORAL | 0 refills | Status: DC | PRN

## 2020-11-14 NOTE — Progress Notes (Signed)
Subjective:  Patient ID: [name], male    DOB: 1930/07/21  Age: 87 y.o. MRN: [id number]  CC: Acute Visit (Pt c/o rash all over his body. Pt states the rash was initially everywhere but now is just on his arms and upper chest. Pt denies pain but states the rash constantly itches. Pt states he has been trying different creams with no relief. )  Rash This is a recurrent problem. The problem has been waxing and waning since onset. The rash is diffuse. The rash is characterized by dryness, itchiness and redness. It is unknown if there was an exposure to a precipitant. Pertinent negatives include no anorexia, congestion, cough, diarrhea, eye pain, facial edema, fatigue, fever, joint pain, nail changes, rhinorrhea, shortness of breath, sore throat or vomiting. Past treatments include anti-itch cream, antihistamine, oral steroids and moisturizer. The treatment provided no relief.  onset 1week ago, started on LE, then progressed to trunk, then UE. had similar rash 01/2020, evaluated by dermatologist at the time, treated with lidex with resolution. use of hypoallergenic detergent and soap. No new medication. No pets at home. Worked in his yard 2weeks ago (raked leaves and mulch with machine but had no direct contact) Completed azithromycin and oral prednisone on 10/09/2020 for bronchitis.  Reviewed past Medical, Social and Family history today.  Outpatient Medications Prior to Visit  Medication Sig Dispense Refill  . amLODipine (NORVASC) 5 MG tablet Take 1 tablet by mouth once daily 30 tablet 0  . aspirin 81 MG tablet Take 81 mg by mouth daily.    . clopidogrel (PLAVIX) 75 MG tablet Take 1 tablet by mouth once daily 90 tablet 0  . fish oil-omega-3 fatty acids 1000 MG capsule Take 2 g by mouth daily.    Marland Kitchen glucosamine-chondroitin 500-400 MG tablet Take 1 tablet by mouth 2 (two) times daily.    Marland Kitchen levothyroxine (SYNTHROID) 88 MCG tablet Take 1 tablet (88 mcg total) by mouth daily before  breakfast. 90 tablet 1  . pravastatin (PRAVACHOL) 40 MG tablet Take 1 tablet (40 mg total) by mouth daily. 90 tablet 2  . azithromycin (ZITHROMAX Z-PAK) 250 MG tablet Take 1 tablet (250 mg total) by mouth daily. 2 tabs today, then 1 tab daily 6 tablet 0  . benzonatate (TESSALON) 100 MG capsule Take 1 capsule (100 mg total) by mouth 3 (three) times daily as needed for cough. 21 capsule 0  . fluocinonide ointment (LIDEX) 0.05 % SMARTSIG:Sparingly Topical Twice Daily PRN    . Multiple Vitamin (MULTIVITAMIN) tablet Take 1 tablet by mouth daily.   (Patient not taking: No sig reported)    . nitroGLYCERIN (NITROSTAT) 0.4 MG SL tablet Place 1 tablet (0.4 mg total) under the tongue every 5 (five) minutes as needed for chest pain. (Patient not taking: No sig reported) 25 tablet 6  . predniSONE (DELTASONE) 20 MG tablet Take 1 tablet (20 mg total) by mouth 2 (two) times daily with a meal. 10 tablet 0   No facility-administered medications prior to visit.   ROS See HPI  Objective:  BP 120/64 (BP Location: Left Arm, Patient Position: Sitting, Cuff Size: Large)   Pulse 60   Temp (!) 97.3 F (36.3 C) (Temporal)   Ht 5\' 9"  (1.753 m)   Wt 171 lb 12.8 oz (77.9 kg)   SpO2 98%   BMI 25.37 kg/m   Physical Exam Constitutional:      General: He is not in acute distress. Neck:     Thyroid: No thyroid  mass, thyromegaly or thyroid tenderness.  Cardiovascular:     Pulses: Normal pulses.  Pulmonary:     Effort: Pulmonary effort is normal.  Lymphadenopathy:     Cervical: No cervical adenopathy.  Skin:    Findings: Rash present. Rash is macular and papular.     Comments: Diffuse maculopapular lesion at various healing stages except on face.  Neurological:     Mental Status: He is alert.     Assessment & Plan:  This visit occurred during the SARS-CoV-2 public health emergency.  Safety protocols were in place, including screening questions prior to the visit, additional usage of staff PPE, and extensive  cleaning of exam room while observing appropriate contact time as indicated for disinfecting solutions.   Azai was seen today for acute visit.  Diagnoses and all orders for this visit:  Generalized maculopapular rash -     Ambulatory referral to Allergy -     hydrOXYzine (ATARAX/VISTARIL) 25 MG tablet; Take 1 tablet (25 mg total) by mouth every 12 (twelve) hours as needed for itching.   Possible contact or allergic dermatitis?  Problem List Items Addressed This Visit   None   Visit Diagnoses    Generalized maculopapular rash    -  Primary   Relevant Medications   hydrOXYzine (ATARAX/VISTARIL) 25 MG tablet   Other Relevant Orders   Ambulatory referral to Allergy      Follow-up: No follow-ups on file.  Alysia Penna, NP

## 2020-11-14 NOTE — Patient Instructions (Signed)
Use hypoallergenic moisturizer and/or calamine lotion to soothe skin avoid hot showers. Use vistaril for itching You will be contacted to schedule an appt with the allergist

## 2020-11-15 ENCOUNTER — Other Ambulatory Visit: Payer: Self-pay | Admitting: Cardiovascular Disease

## 2020-11-16 ENCOUNTER — Encounter: Payer: Self-pay | Admitting: Nurse Practitioner

## 2020-11-24 ENCOUNTER — Other Ambulatory Visit: Payer: Self-pay

## 2020-11-24 ENCOUNTER — Ambulatory Visit: Payer: Medicare Other | Admitting: Cardiovascular Disease

## 2020-11-24 ENCOUNTER — Encounter: Payer: Self-pay | Admitting: Cardiovascular Disease

## 2020-11-24 VITALS — BP 116/52 | HR 64 | Ht 69.0 in | Wt 167.0 lb

## 2020-11-24 DIAGNOSIS — Z79899 Other long term (current) drug therapy: Secondary | ICD-10-CM | POA: Diagnosis not present

## 2020-11-24 DIAGNOSIS — I2510 Atherosclerotic heart disease of native coronary artery without angina pectoris: Secondary | ICD-10-CM | POA: Diagnosis not present

## 2020-11-24 DIAGNOSIS — E785 Hyperlipidemia, unspecified: Secondary | ICD-10-CM | POA: Diagnosis not present

## 2020-11-24 NOTE — Patient Instructions (Signed)
Medication Instructions:  The current medical regimen is effective;  continue present plan and medications.  *If you need a refill on your cardiac medications before your next appointment, please call your pharmacy*  Lab Work: Please return fasting for blood work on Tuesday (Lipid, hepatic, BMP)  If you have labs (blood work) drawn today and your tests are completely normal, you will receive your results only by:  MyChart Message (if you have MyChart) OR  A paper copy in the mail If you have any lab test that is abnormal or we need to change your treatment, we will call you to review the results.  Follow-Up: At [facility], you and your health needs are our priority.  As part of our continuing mission to provide you with exceptional heart care, we have created designated Provider Care Teams.  These Care Teams include your primary Cardiologist (physician) and Advanced Practice Providers (APPs -  Physician Assistants and Nurse Practitioners) who all work together to provide you with the care you need, when you need it.  We recommend signing up for the patient portal called "MyChart".  Sign up information is provided on this After Visit Summary.  MyChart is used to connect with patients for Virtual Visits (Telemedicine).  Patients are able to view lab/test results, encounter notes, upcoming appointments, etc.  Non-urgent messages can be sent to your provider as well.   To learn more about what you can do with MyChart, go to [email or web address].    Your next appointment:   12 month(s)  The format for your next appointment:   In Person  Provider:   [name], MD   Thank you for choosing [facility]!!

## 2020-11-24 NOTE — Progress Notes (Signed)
[name] Date of Birth  05-05-1930  [address]    [address] [place], [place]  [address] [phone]  Problem List: 1. CAD - PCI of RCA March, 2012 2. RBBB 3. Hypothyroidism 4. Carotid bruits 5. hyperlipidemia   [name] is an elderly gentleman with a history of coronary artery disease. He status post PTCA and stenting of the mid-right coronary artery in April of this year. He's done well since I last saw him. He's not had any episodes of chest pain or shortness of breath. He's been tolerating his medications without any difficulty.  No angina. No dyspnea.  Exercising regularly - golf 1 day a week. Works out in [employer] 3 days a week ( including spin class)  Dec. 5, 2016:  Was last seen in 2013. Recently saw [name]. 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  Sharp pain . Has not carried any NTG in years.  Still working out regularly - without any angina .   Works out for 2 hours. Still playing golf several days a week.   Dec. 5, 2017:  Doing well. Played 27 holes of golf yesterday. Goes to the gym 3 days a week - spin class.   Jan. 16, 2019:  [name] is seen back today for follow-up of his coronary artery disease and carotid artery disease.  He had a carotid duplex scan in December, 2018 which reveals moderate bilateral carotid disease.  He will be scheduled to repeat this in 1 year.  Fasting labs recently reveal normal electrolytes and a stable creatinine at 1.29.  His liver enzymes are normal.  Total cholesterol is 126.  His HDL is stable at 30.  LDL is 67.  Triglycerides are 146.  Still exercising at [employer] 3 days a week.   Plays golf regularly  No CP or dyspnea.   Jan. 16 , 2020  [name] is seen today for a 1 year office visit Has a hx of CAD  With PCI of his RCA in March, 2012 Has moderate bilateral carotid artery disease  No syncope Has occasional episodes of upper abdominal /  lower chest pain He thinks its indigestion - is relieved with antiacids He exercises on a regular basis and these pains are not brought on by exercise. Lasts for a few seconds  Goes to [employer] regularly   Jan. 20, 2021:  [name] is seen today for follow up of his CAD.   S/p PCI of his RCA in March , 2012. Carotid duplex scan in Dec. 2020 showed moderate R carotid disease and mild L carotid stenosis.  No CP, no dyspnea.  No syncope or presyncope  Jan. 21, 2022:  [name] is seen for follow up of his CAD  Still working out at the [employer] .  No CP , no dyspnea    Current Outpatient Medications on File Prior to Visit  Medication Sig Dispense Refill  . amLODipine (NORVASC) 5 MG tablet Take 1 tablet by mouth once daily 30 tablet 0  . aspirin 81 MG tablet Take 81 mg by mouth daily.    . clopidogrel (PLAVIX) 75 MG tablet Take 1 tablet by mouth once daily 90 tablet 0  . fish oil-omega-3 fatty acids 1000 MG capsule Take 2 g by mouth daily.    Marland Kitchen glucosamine-chondroitin 500-400 MG tablet Take 1 tablet by mouth daily in the afternoon.    . hydrOXYzine (  ATARAX/VISTARIL) 25 MG tablet Take 1 tablet (25 mg total) by mouth every 12 (twelve) hours as needed for itching. 30 tablet 0  . levothyroxine (SYNTHROID) 88 MCG tablet Take 1 tablet (88 mcg total) by mouth daily before breakfast. 90 tablet 1  . pravastatin (PRAVACHOL) 40 MG tablet Take 1 tablet (40 mg total) by mouth daily. Please keep upcoming appt in January 2022 with Dr. Elease Hashimoto for future refills. Thank you 90 tablet 0   No current facility-administered medications on file prior to visit.    No Known Allergies  Past Medical History:  Diagnosis Date  . Coronary artery disease    PCI, STent to RCA  . Hyperlipidemia   . Thyroid disease    hypothyroidism    Past Surgical History:  Procedure Laterality Date  . APPENDECTOMY    . CARDIAC CATHETERIZATION     2007 occluded ramus inter. , PCI to RCA  . CARPAL TUNNEL RELEASE       Social History   Tobacco Use  Smoking Status Former Smoker  . Quit date: 11/04/1960  . Years since quitting: 60.0  Smokeless Tobacco Never Used    Social History   Substance and Sexual Activity  Alcohol Use No    Family History  Problem Relation Age of Onset  . Hypertension Mother   . Hypertension Father     Reviw of Systems:  Reviewed in the HPI.  All other systems are negative.  Physical Exam: Blood pressure (!) 116/52, pulse 64, height 5\' 9"  (1.753 m), weight 167 lb (75.8 kg), SpO2 99 %.  GEN:  Well nourished, well developed in no acute distress HEENT: Normal NECK: No JVD;    Soft bilateral carotid bruits  LYMPHATICS: No lymphadenopathy CARDIAC: RRR  , 2/6 systolic murmur radiating to his axilla  RESPIRATORY:  Clear  ABDOMEN: Soft, non-tender, non-distended MUSCULOSKELETAL:  No edema; No deformity  SKIN:  Has a diffuse red skin rash  NEUROLOGIC:  Alert and oriented x 3    ECG: November 24, 2020: Normal sinus rhythm at 64.  Right bundle branch block.  No changes from previous EKG.   Assessment / Plan:   1. CAD -   No angina .    Exercises regularly without any angina  Check lipids, liver enz, bmp next week    2. RBBB -  stable   3. Hypothyroidism-   4. Carotid bruits -     5. Hyperlipidemia-   Check labs next week .  Cont current meds.    November 26, 2020, MD  11/24/2020 2:29 PM    [facility] [address] [place],  [address] [place], [place]  [place] Pager [phone] Phone: [phone]; Fax: [phone]

## 2020-11-28 ENCOUNTER — Other Ambulatory Visit: Payer: Self-pay

## 2020-11-28 ENCOUNTER — Other Ambulatory Visit: Payer: Medicare Other | Admitting: *Deleted

## 2020-11-28 DIAGNOSIS — E785 Hyperlipidemia, unspecified: Secondary | ICD-10-CM

## 2020-11-28 DIAGNOSIS — I2510 Atherosclerotic heart disease of native coronary artery without angina pectoris: Secondary | ICD-10-CM

## 2020-11-28 DIAGNOSIS — Z79899 Other long term (current) drug therapy: Secondary | ICD-10-CM

## 2020-11-28 LAB — LIPID PANEL
Chol/HDL Ratio: 4.5 ratio (ref 0.0–5.0)
Cholesterol, Total: 147 mg/dL (ref 100–199)
HDL: 33 mg/dL — ABNORMAL LOW (ref >39)
LDL Chol Calc (NIH): 84 mg/dL (ref 0–99)
Triglycerides: 173 mg/dL — ABNORMAL HIGH (ref 0–149)
VLDL Cholesterol Cal: 30 mg/dL (ref 5–40)

## 2020-11-28 LAB — BASIC METABOLIC PANEL
BUN/Creatinine Ratio: 15 (ref 10–24)
BUN: 18 mg/dL (ref 8–27)
CO2: 25 mmol/L (ref 20–29)
Calcium: 9.9 mg/dL (ref 8.6–10.2)
Chloride: 103 mmol/L (ref 96–106)
Creatinine, Ser: 1.23 mg/dL (ref 0.76–1.27)
GFR calc Af Amer: 60 mL/min/{1.73_m2} (ref >59)
GFR calc non Af Amer: 52 mL/min/{1.73_m2} — ABNORMAL LOW (ref >59)
Glucose: 138 mg/dL — ABNORMAL HIGH (ref 65–99)
Potassium: 4.2 mmol/L (ref 3.5–5.2)
Sodium: 140 mmol/L (ref 134–144)

## 2020-11-28 LAB — HEPATIC FUNCTION PANEL
ALT: 18 IU/L (ref 0–44)
AST: 19 IU/L (ref 0–40)
Albumin: 4.2 g/dL (ref 3.6–4.6)
Alkaline Phosphatase: 84 IU/L (ref 44–121)
Bilirubin Total: 0.5 mg/dL (ref 0.0–1.2)
Bilirubin, Direct: 0.13 mg/dL (ref 0.00–0.40)
Total Protein: 6.2 g/dL (ref 6.0–8.5)

## 2020-11-29 ENCOUNTER — Encounter: Payer: Self-pay | Admitting: Nurse Practitioner

## 2020-11-29 ENCOUNTER — Ambulatory Visit: Payer: Medicare Other | Admitting: Nurse Practitioner

## 2020-11-29 ENCOUNTER — Other Ambulatory Visit: Payer: Self-pay

## 2020-11-29 VITALS — BP 134/70 | HR 66 | Temp 97.1°F | Wt 168.2 lb

## 2020-11-29 DIAGNOSIS — R739 Hyperglycemia, unspecified: Secondary | ICD-10-CM

## 2020-11-29 DIAGNOSIS — E039 Hypothyroidism, unspecified: Secondary | ICD-10-CM | POA: Diagnosis not present

## 2020-11-29 DIAGNOSIS — L298 Other pruritus: Secondary | ICD-10-CM | POA: Diagnosis not present

## 2020-11-29 LAB — CBC WITH DIFFERENTIAL/PLATELET
Basophils Absolute: 0.1 10*3/uL (ref 0.0–0.1)
Basophils Relative: 0.9 % (ref 0.0–3.0)
Eosinophils Absolute: 0.6 10*3/uL (ref 0.0–0.7)
Eosinophils Relative: 8.7 % — ABNORMAL HIGH (ref 0.0–5.0)
HCT: 43.1 % (ref 39.0–52.0)
Hemoglobin: 14.9 g/dL (ref 13.0–17.0)
Lymphocytes Relative: 24.6 % (ref 12.0–46.0)
Lymphs Abs: 1.7 10*3/uL (ref 0.7–4.0)
MCHC: 34.7 g/dL (ref 30.0–36.0)
MCV: 93.1 fl (ref 78.0–100.0)
Monocytes Absolute: 0.7 10*3/uL (ref 0.1–1.0)
Monocytes Relative: 10.3 % (ref 3.0–12.0)
Neutro Abs: 3.9 10*3/uL (ref 1.4–7.7)
Neutrophils Relative %: 55.5 % (ref 43.0–77.0)
Platelets: 280 10*3/uL (ref 150.0–400.0)
RBC: 4.63 Mil/uL (ref 4.22–5.81)
RDW: 13.6 % (ref 11.5–15.5)
WBC: 7 10*3/uL (ref 4.0–10.5)

## 2020-11-29 LAB — C-REACTIVE PROTEIN: CRP: <1 mg/dL (ref 0.5–20.0)

## 2020-11-29 MED ORDER — PRAVASTATIN SODIUM 40 MG PO TABS: 40.0000 mg | ORAL_TABLET | Freq: Every day | ORAL | 3 refills | Status: DC

## 2020-11-29 MED ORDER — CETIRIZINE HCL 10 MG PO TABS: 10.0000 mg | ORAL_TABLET | Freq: Two times a day (BID) | ORAL | 0 refills | Status: DC

## 2020-11-29 MED ORDER — FAMOTIDINE 20 MG PO TABS: 20.0000 mg | ORAL_TABLET | Freq: Two times a day (BID) | ORAL | 0 refills | Status: DC

## 2020-11-29 MED ORDER — AMLODIPINE BESYLATE 5 MG PO TABS: 5.0000 mg | ORAL_TABLET | Freq: Every day | ORAL | 3 refills | Status: DC

## 2020-11-29 MED ORDER — CLOPIDOGREL BISULFATE 75 MG PO TABS: 75.0000 mg | ORAL_TABLET | Freq: Every day | ORAL | 3 refills | Status: DC

## 2020-11-29 NOTE — Telephone Encounter (Signed)
Pt's medications were sent to pt's pharmacy as requested. Confirmation received.  

## 2020-11-29 NOTE — Progress Notes (Signed)
Subjective:  Patient ID: [name], male    DOB: [birth year]  Age: 87 y.o. MRN: [id number]  CC: Acute Visit (Pt states the rash is all over and that it does appear to be getting a little better but he is now out of cream. )  HPI Upcoming appt with allergist: 12/12/2020 Accompanied by [name]. [name] reports persistent diffuse rash with itching. No improvement with vistaril BID. No fever, no joint pain/swelling, no malaise, no swollen lymph node.  Reviewed past Medical, Social and Family history today.  Outpatient Medications Prior to Visit  Medication Sig Dispense Refill  . amLODipine (NORVASC) 5 MG tablet Take 1 tablet (5 mg total) by mouth daily. 90 tablet 3  . aspirin 81 MG tablet Take 81 mg by mouth daily.    . clopidogrel (PLAVIX) 75 MG tablet Take 1 tablet (75 mg total) by mouth daily. 90 tablet 3  . fish oil-omega-3 fatty acids 1000 MG capsule Take 2 g by mouth daily.    Marland Kitchen glucosamine-chondroitin 500-400 MG tablet Take 1 tablet by mouth daily in the afternoon.    . hydrOXYzine (ATARAX/VISTARIL) 25 MG tablet Take 1 tablet (25 mg total) by mouth every 12 (twelve) hours as needed for itching. 30 tablet 0  . levothyroxine (SYNTHROID) 88 MCG tablet Take 1 tablet (88 mcg total) by mouth daily before breakfast. 90 tablet 1  . pravastatin (PRAVACHOL) 40 MG tablet Take 1 tablet (40 mg total) by mouth daily. 90 tablet 3   No facility-administered medications prior to visit.    ROS See HPI  Objective:  BP 134/70 (BP Location: Left Arm, Patient Position: Sitting, Cuff Size: Normal)   Pulse 66   Temp (!) 97.1 F (36.2 C) (Temporal)   Wt 168 lb 3.2 oz (76.3 kg)   SpO2 96%   BMI 24.84 kg/m   Physical Exam Vitals reviewed.  Skin:    Findings: Rash present. Rash is macular and papular.       Neurological:     Mental Status: He is alert.     Assessment & Plan:  This visit occurred during the SARS-CoV-2 public health emergency.  Safety protocols were in place,  including screening questions prior to the visit, additional usage of staff PPE, and extensive cleaning of exam room while observing appropriate contact time as indicated for disinfecting solutions.   [name] was seen today for acute visit.  Diagnoses and all orders for this visit:  Pruritic erythematous rash -     Allergen food profile specific IgE -     Antinuclear Antib (ANA) -     Sedimentation rate -     CBC with Differential/Platelet -     C-reactive protein -     Lyme Ab (IgG/M) + RMSF( IgG/M) -     RPR -     Alpha-Gal Panel -     cetirizine (ZYRTEC) 10 MG tablet; Take 1 tablet (10 mg total) by mouth 2 (two) times daily. -     famotidine (PEPCID) 20 MG tablet; Take 1 tablet (20 mg total) by mouth 2 (two) times daily.  Hypothyroidism, unspecified type -     Thyroid Panel With TSH  Hyperglycemia -     Hemoglobin A1c   Problem List Items Addressed This Visit      Endocrine   Hypothyroidism    Repeat Thyroid panel Maintain current dose while waiting for results      Relevant Orders   Thyroid Panel With TSH  Other   Hyperglycemia   Relevant Orders   Hemoglobin A1c    Other Visit Diagnoses    Pruritic erythematous rash    -  Primary   Relevant Medications   cetirizine (ZYRTEC) 10 MG tablet   famotidine (PEPCID) 20 MG tablet   Other Relevant Orders   Allergen food profile specific IgE   Antinuclear Antib (ANA)   Sedimentation rate   CBC with Differential/Platelet   C-reactive protein   Lyme Ab (IgG/M) + RMSF( IgG/M)   RPR   Alpha-Gal Panel      Follow-up: Return if symptoms worsen or fail to improve.  Alysia Penna, NP

## 2020-11-29 NOTE — Assessment & Plan Note (Signed)
Repeat Thyroid panel Maintain current dose while waiting for results

## 2020-11-29 NOTE — Patient Instructions (Addendum)
Go to lab for blood draw.  Stop vistaril if it does not help with itching.  Use benadryl 25mg  every 8hrs as needed for itching.  Maintain upcoming appt with allergist.

## 2020-11-30 LAB — SEDIMENTATION RATE: Sed Rate: 21 mm/hr — ABNORMAL HIGH (ref 0–20)

## 2020-12-02 LAB — RPR

## 2020-12-02 LAB — THYROID PANEL WITH TSH

## 2020-12-02 LAB — ANA: Anti Nuclear Antibody (ANA): NEGATIVE

## 2020-12-05 ENCOUNTER — Other Ambulatory Visit (INDEPENDENT_AMBULATORY_CARE_PROVIDER_SITE_OTHER): Payer: Medicare Other

## 2020-12-05 ENCOUNTER — Other Ambulatory Visit: Payer: Self-pay

## 2020-12-05 DIAGNOSIS — R739 Hyperglycemia, unspecified: Secondary | ICD-10-CM | POA: Diagnosis not present

## 2020-12-05 DIAGNOSIS — E039 Hypothyroidism, unspecified: Secondary | ICD-10-CM

## 2020-12-05 DIAGNOSIS — L298 Other pruritus: Secondary | ICD-10-CM

## 2020-12-05 LAB — ALLERGEN FOOD PROFILE SPECIFIC IGE
Allergen Apple, IgE: <0.1 kU/L
Allergen Corn, IgE: <0.1 kU/L
Allergen Tomato, IgE: <0.1 kU/L
Chicken IgE: <0.1 kU/L
Codfish IgE: <0.1 kU/L
Egg White IgE: <0.1 kU/L
IgE (Immunoglobulin E), Serum: 13 IU/mL (ref 6–495)
Milk IgE: <0.1 kU/L
Orange: <0.1 kU/L
Peanut IgE: <0.1 kU/L
Shrimp IgE: <0.1 kU/L
Soybean IgE: <0.1 kU/L
Tuna: <0.1 kU/L
Wheat IgE: <0.1 kU/L

## 2020-12-05 LAB — LYMEAB(IGG/M)+RMSF(IGG/M)
LYME DISEASE AB, QUANT, IGM: <0.8 index (ref 0.00–0.79)
Lyme IgG/IgM Ab: <0.91 {ISR} (ref 0.00–0.90)
RMSF IgG: NEGATIVE
RMSF IgM: 0.34 index (ref 0.00–0.89)

## 2020-12-05 LAB — HEMOGLOBIN A1C: Hgb A1c MFr Bld: 6.7 % — ABNORMAL HIGH (ref 4.6–6.5)

## 2020-12-05 NOTE — Addendum Note (Signed)
Addended by: Varney Biles on: 12/05/2020 08:32 AM   Modules accepted: Orders

## 2020-12-06 ENCOUNTER — Encounter: Payer: Self-pay | Admitting: Nurse Practitioner

## 2020-12-06 ENCOUNTER — Other Ambulatory Visit: Payer: Self-pay | Admitting: Nurse Practitioner

## 2020-12-06 DIAGNOSIS — E039 Hypothyroidism, unspecified: Secondary | ICD-10-CM

## 2020-12-06 MED ORDER — LEVOTHYROXINE SODIUM 88 MCG PO TABS
88.0000 ug | ORAL_TABLET | Freq: Every day | ORAL | 3 refills | Status: DC
Start: 2020-12-06 — End: 2021-12-27

## 2020-12-06 NOTE — Progress Notes (Signed)
stable thyroid function Elevated hgbA1c at 6.7 which means you are diabetic. Maintain low fat/low carb diet. Avoid sodas/juices. Will repeat in 17months. If  worse, will need to start metformin. Sent glucometer rx to monitor glucose in AM 2x/week. Call office if fasting glucose is >200. Pending alpha gal panel

## 2020-12-07 LAB — ALPHA-GAL PANEL
Beef IgE: <0.1 kU/L (ref <0.35)
Class: 0
Class: 0
Class: 0
Galactose-alpha-1,3-galactose IgE: <0.1 kU/L (ref <0.10)
LAMB/MUTTON IGE: <0.1 kU/L (ref <0.35)
Pork IgE: <0.1 kU/L (ref <0.35)

## 2020-12-08 NOTE — Telephone Encounter (Signed)
Outcome Approved on January 13 Effective from 11/16/2020 through 11/16/2021. Drug: hydrOXYzine HCl 25MG  tablets Form: [place] Medicare Part D General Authorization Form Original Claim Info

## 2020-12-11 LAB — ALPHA-GAL PANEL
Beef IgE: <0.1 kU/L (ref <0.35)
Class: 0
Class: 0
Class: 0
Galactose-alpha-1,3-galactose IgE: <0.1 kU/L (ref <0.10)
LAMB/MUTTON IGE: <0.1 kU/L (ref <0.35)
Pork IgE: <0.1 kU/L (ref <0.35)

## 2020-12-11 LAB — THYROID PANEL WITH TSH
Free Thyroxine Index: 2.1 (ref 1.4–3.8)
T3 Uptake: 31 % (ref 22–35)
T4, Total: 6.9 ug/dL (ref 4.9–10.5)
TSH: 2.08 mIU/L (ref 0.40–4.50)

## 2020-12-13 ENCOUNTER — Ambulatory Visit: Payer: Medicare Other | Admitting: Allergy

## 2020-12-13 ENCOUNTER — Encounter: Payer: Self-pay | Admitting: Allergy

## 2020-12-13 ENCOUNTER — Other Ambulatory Visit: Payer: Self-pay

## 2020-12-13 VITALS — BP 122/64 | HR 60 | Temp 97.9°F | Resp 16 | Ht 68.0 in | Wt 163.1 lb

## 2020-12-13 DIAGNOSIS — L299 Pruritus, unspecified: Secondary | ICD-10-CM | POA: Insufficient documentation

## 2020-12-13 DIAGNOSIS — R21 Rash and other nonspecific skin eruption: Secondary | ICD-10-CM | POA: Diagnosis not present

## 2020-12-13 DIAGNOSIS — L282 Other prurigo: Secondary | ICD-10-CM | POA: Insufficient documentation

## 2020-12-13 MED ORDER — TRIAMCINOLONE ACETONIDE 0.1 % EX OINT: 1.0000 "application " | TOPICAL_OINTMENT | Freq: Two times a day (BID) | CUTANEOUS | 2 refills | Status: DC

## 2020-12-13 NOTE — Assessment & Plan Note (Addendum)
Pruritic rash started a few months ago. Had Covid-19 in Dec 2021 s/p mab infusion but not sure if the rash was there before or after his infection. Denies any other changes in diet, medications or personal care products. 2022 bloodwork - TSH, CBC diff, ESR, Crp, CMP, ANA, alpha gal, basic food panel unremarkable. Seen by dermatology and had antihistamines and prednisone with unknown benefit. Skin biopsy not done due to financial reasons.   Discussed with patient that based on presentation and clinical history not sure what's causing the rash. Some concern if it's may have been triggered by his recent Covid-19 infection and/or mab infusion.  Will get some additional bloodwork to rule any other underlying causes.   Continue to moisturize daily with fragrance free and dye free products.  No dryer sheets or fabric softeners. Moisturizer: Triamcinolone-Eucerin twice a day. Itching: Take Zyrtec 55m in the morning Take famotidine (Pepcid) 223mtwice a day. Take hydroxyzine 2563m hour before bed.  If bloodwork is normal and above regimen not effective, recommendation is to get skin biopsy next.

## 2020-12-13 NOTE — Progress Notes (Signed)
New Patient Note  RE: [name] MRN: [id number] DOB: September 07, 1930 Date of Office Visit: 12/13/2020  Referring provider: Flossie Buffy, NP Primary care provider: Flossie Buffy, NP  Chief Complaint: Rash  History of Present Illness: I had the pleasure of seeing [name] for initial evaluation at the [facility] [place] on 12/13/2020. He is a 87 y.o. male, who is referred here by Nche, Charlene Brooke, NP for the evaluation of rash.  Rash started about a few months ago. This can occur anywhere on his body but worse under his armpits. Describes them as red, raised, itching, dry. No ecchymosis upon resolution. Associated symptoms include: none. Suspected triggers are unknown. Denies any changes in medications, foods, personal care products. Patient had COVID-19 in Dec 2021 and had mab infusion.  He has tried the following therapies: zyrtec, hydroxyzine, with minimal benefit. Systemic steroids yes and not sure if it was helpful.   Uses Vaseline cream.  Previous work up includes: 2022 bloodwork - TSH, CBC diff, ESR, Crp, CMP. Patient saw dermatology twice for a different type of rash which appeared about 6-12 months ago on his lower extremities - these are still present but different than the rash on his torso.  Skin biopsy was recommend but patient couldn't afford it at that time.   Patient is up to date with the following cancer screening tests: colonoscopy, physical exam - no prior history of cancer.  11/14/2020 PCP visit: "This is a recurrent problem. The problem has been waxing and waning since onset. The rash is diffuse. The rash is characterized by dryness, itchiness and redness. It is unknown if there was an exposure to a precipitant. Pertinent negatives include no anorexia, congestion, cough, diarrhea, eye pain, facial edema, fatigue, fever, joint pain, nail changes, rhinorrhea, shortness of breath, sore throat or vomiting. Past treatments include anti-itch  cream, antihistamine, oral steroids and moisturizer. The treatment provided no relief.  onset 1week ago, started on LE, then progressed to trunk, then UE. had similar rash 01/2020, evaluated by dermatologist at the time, treated with lidex with resolution. use of hypoallergenic detergent and soap. No new medication. No pets at home. Worked in his yard 2weeks ago (raked leaves and mulch with machine but had no direct contact) Completed azithromycin and oral prednisone on 10/09/2020 for bronchitis."  Component     Latest Ref Rng & Units 11/29/2020  Class Description Allergens      Comment  IgE (Immunoglobulin E), Serum     6 - 495 IU/mL 13  Egg White IgE     Class 0 kU/L <0.10  Milk IgE     Class 0 kU/L <0.10  Codfish IgE     Class 0 kU/L <0.10  Wheat IgE     Class 0 kU/L <0.10  Allergen Corn, IgE     Class 0 kU/L <0.10  Peanut IgE     Class 0 kU/L <0.10  Soybean IgE     Class 0 kU/L <0.10  Shrimp IgE     Class 0 kU/L <0.10  Allergen Tomato, IgE     Class 0 kU/L <0.10  Orange     Class 0 kU/L <0.10  Tuna     Class 0 kU/L <0.10  Allergen Apple, IgE     Class 0 kU/L <0.10  Chicken IgE     Class 0 kU/L <0.10   Assessment and Plan: [name] is a 87 y.o. male with: Rash and other nonspecific skin eruption Pruritic  rash started a few months ago. Had Covid-19 in Dec 2021 s/p mab infusion but not sure if the rash was there before or after his infection. Denies any other changes in diet, medications or personal care products. 2022 bloodwork - TSH, CBC diff, ESR, Crp, CMP, ANA, alpha gal, basic food panel unremarkable. Seen by dermatology and had antihistamines and prednisone with unknown benefit. Skin biopsy not done due to financial reasons.   Discussed with patient that based on presentation and clinical history not sure what's causing the rash. Some concern if it's may have been triggered by his recent Covid-19 infection and/or mab infusion.  Will get some additional bloodwork to  rule any other underlying causes.   Continue to moisturize daily with fragrance free and dye free products.  No dryer sheets or fabric softeners. Moisturizer: Triamcinolone-Eucerin twice a day. Itching: Take Zyrtec 33m in the morning Take famotidine (Pepcid) 229mtwice a day. Take hydroxyzine 2534m hour before bed.  If bloodwork is normal and above regimen not effective, recommendation is to get skin biopsy next.   Return in about 4 weeks (around 01/10/2021).  Meds ordered this encounter  Medications  . triamcinolone ointment (KENALOG) 0.1 %    Sig: Apply 1 application topically 2 (two) times daily. Apply to whole body as a moisturizer twice a day.    Dispense:  453 g    Refill:  2    Mix 1:1 with Eucerin.    Lab Orders     Tryptase     Chronic Urticaria     Allergens w/Total IgE Area 2  Other allergy screening: Asthma: no Rhino conjunctivitis: no Food allergy: no Medication allergy: no Hymenoptera allergy: no History of recurrent infections suggestive of immunodeficency: no  Diagnostics: None.  Past Medical History: Patient Active Problem List   Diagnosis Date Noted  . Rash and other nonspecific skin eruption 12/13/2020  . Pruritus 12/13/2020  . CKD (chronic kidney disease) stage 3, GFR 30-59 ml/min (HCC) 12/24/2019  . DM (diabetes mellitus) ([facility]2/02/2020  . Hyperlipidemia 10/09/2015  . Hypothyroidism   . CAD (coronary artery disease) 02/12/2011  . Hypertension 02/12/2011   Past Medical History:  Diagnosis Date  . Coronary artery disease    PCI, STent to RCA  . Hyperlipidemia   . Rash   . Thyroid disease    hypothyroidism   Past Surgical History: Past Surgical History:  Procedure Laterality Date  . APPENDECTOMY    . CARDIAC CATHETERIZATION     2007 occluded ramus inter. , PCI to RCA  . CARPAL TUNNEL RELEASE     Medication List:  Current Outpatient Medications  Medication Sig Dispense Refill  . amLODipine (NORVASC) 5 MG tablet Take 1 tablet  (5 mg total) by mouth daily. 90 tablet 3  . aspirin 81 MG tablet Take 81 mg by mouth daily.    . cetirizine (ZYRTEC) 10 MG tablet Take 1 tablet (10 mg total) by mouth 2 (two) times daily. 60 tablet 0  . clopidogrel (PLAVIX) 75 MG tablet Take 1 tablet (75 mg total) by mouth daily. 90 tablet 3  . fish oil-omega-3 fatty acids 1000 MG capsule Take 2 g by mouth daily.    . gMarland Kitchenucosamine-chondroitin 500-400 MG tablet Take 1 tablet by mouth daily in the afternoon.    . hydrOXYzine (ATARAX/VISTARIL) 25 MG tablet Take 1 tablet (25 mg total) by mouth every 12 (twelve) hours as needed for itching. 30 tablet 0  . levothyroxine (SYNTHROID) 88 MCG tablet Take 1  tablet (88 mcg total) by mouth daily before breakfast. 90 tablet 3  . pravastatin (PRAVACHOL) 40 MG tablet Take 1 tablet (40 mg total) by mouth daily. 90 tablet 3  . triamcinolone ointment (KENALOG) 0.1 % Apply 1 application topically 2 (two) times daily. Apply to whole body as a moisturizer twice a day. 453 g 2  . famotidine (PEPCID) 20 MG tablet Take 1 tablet (20 mg total) by mouth 2 (two) times daily. (Patient not taking: Reported on 12/13/2020) 60 tablet 0   No current facility-administered medications for this visit.   Allergies: No Known Allergies Social History: Social History   Socioeconomic History  . Marital status: Married    Spouse name: Not on file  . Number of children: Not on file  . Years of education: Not on file  . Highest education level: Not on file  Occupational History  . Occupation: reired  Tobacco Use  . Smoking status: Former Smoker    Quit date: 11/04/1960    Years since quitting: 60.1  . Smokeless tobacco: Never Used  Vaping Use  . Vaping Use: Never used  Substance and Sexual Activity  . Alcohol use: No  . Drug use: No  . Sexual activity: Not on file  Other Topics Concern  . Not on file  Social History Narrative  . Not on file   Social Determinants of Health   Financial Resource Strain: Low Risk   .  Difficulty of Paying Living Expenses: Not hard at all  Food Insecurity: No Food Insecurity  . Worried About Charity fundraiser in the Last Year: Never true  . Ran Out of Food in the Last Year: Never true  Transportation Needs: No Transportation Needs  . Lack of Transportation (Medical): No  . Lack of Transportation (Non-Medical): No  Physical Activity: Sufficiently Active  . Days of Exercise per Week: 3 days  . Minutes of Exercise per Session: 90 min  Stress: No Stress Concern Present  . Feeling of Stress : Not at all  Social Connections: Socially Integrated  . Frequency of Communication with Friends and Family: More than three times a week  . Frequency of Social Gatherings with Friends and Family: Once a week  . Attends Religious Services: More than 4 times per year  . Active Member of Clubs or Organizations: Yes  . Attends Archivist Meetings: More than 4 times per year  . Marital Status: Married   Lives in a 85 year old house. Smoking: denies Occupation: retired  Programme researcher, broadcasting/film/video HistoryFreight forwarder in the house: no Charity fundraiser in the family room: no Carpet in the bedroom: no Heating: gas Cooling: central Pet: no  Family History: Family History  Problem Relation Age of Onset  . Hypertension Mother   . Hypertension Father   . Allergic rhinitis Neg Hx   . Angioedema Neg Hx   . Asthma Neg Hx   . Eczema Neg Hx   . Immunodeficiency Neg Hx   . Urticaria Neg Hx    Review of Systems  Constitutional: Negative for appetite change, chills, fever and unexpected weight change.  HENT: Negative for congestion and rhinorrhea.   Eyes: Negative for itching.  Respiratory: Negative for cough, chest tightness, shortness of breath and wheezing.   Cardiovascular: Negative for chest pain.  Gastrointestinal: Negative for abdominal pain.  Genitourinary: Negative for difficulty urinating.  Skin: Positive for rash.  Neurological: Negative for headaches.   Objective: BP  122/64 (BP Location: Right Arm, Patient Position: Sitting, Cuff  Size: Normal)   Pulse 60   Temp 97.9 F (36.6 C) (Temporal)   Resp 16   Ht '5\' 8"'  (1.727 m)   Wt 163 lb 2.3 oz (74 kg)   SpO2 100%   BMI 24.81 kg/m  Body mass index is 24.81 kg/m. Physical Exam Vitals and nursing note reviewed.  Constitutional:      Appearance: Normal appearance. He is well-developed.  HENT:     Head: Normocephalic and atraumatic.     Right Ear: External ear normal.     Left Ear: External ear normal.     Nose: Nose normal.     Mouth/Throat:     Mouth: Mucous membranes are moist.     Pharynx: Oropharynx is clear.  Eyes:     Conjunctiva/sclera: Conjunctivae normal.  Cardiovascular:     Rate and Rhythm: Normal rate and regular rhythm.     Heart sounds: Murmur heard.  No friction rub. No gallop.   Pulmonary:     Effort: Pulmonary effort is normal.     Breath sounds: Normal breath sounds. No wheezing, rhonchi or rales.  Musculoskeletal:     Cervical back: Neck supple.  Skin:    General: Skin is warm and dry.     Findings: Rash present.     Comments: Darker erythematous patches on ankle region b/l. The rash on the torso is more vibrant red, non blanchable and present in large areas under left midaxillary area, abdominal area, anterior chest, upper extremities b/l.   Neurological:     Mental Status: He is alert and oriented to person, place, and time.  Psychiatric:        Behavior: Behavior normal.    The plan was reviewed with the patient/family, and all questions/concerned were addressed.  It was my pleasure to see Chae today and participate in his care. Please feel free to contact me with any questions or concerns.  Sincerely,  Rexene Alberts, DO Allergy & Immunology  [facility] office: [place] office: [phone]

## 2020-12-13 NOTE — Patient Instructions (Addendum)
Skin: Moisturizer: Triamcinolone-Eucerin twice a day. Itching: Take Zyrtec 10mg  in the morning Take famotidine (pepcid) 20mg  twice a day. Take hydroxyzine 25mg  1 hour before bed   Continue to moisturize daily with fragrance free and dye free products.  No dryer sheets or fabric softeners.  . Get bloodwork:  o We are ordering labs, so please allow 1-2 weeks for the results to come back. o With the newly implemented Cures Act, the labs might be visible to you at the same time that they become visible to me. However, I will not address the results until all of the results are back, so please be patient.   Follow up in 1 months or sooner if needed.   Skin care recommendations  Bath time: . Always use lukewarm water. AVOID very hot or cold water. Keep bathing time to 5-10 minutes. . Do NOT use bubble bath. . Use a mild soap and use just enough to wash the dirty areas. . Do NOT scrub skin vigorously.  . After bathing, pat dry your skin with a towel. Do NOT rub or scrub the skin.  Moisturizers and prescriptions:  . ALWAYS apply moisturizers immediately after bathing (within 3 minutes). This helps to lock-in moisture. . Use the moisturizer several times a day over the whole body. summer moisturizers include: Aveeno, CeraVe, Cetaphil. winter moisturizers include: Aquaphor, Vaseline, Cerave, Cetaphil, Eucerin, Vanicream. . When using moisturizers along with medications, the moisturizer should be applied about one hour after applying the medication to prevent diluting effect of the medication or moisturize around where you applied the medications. When not using medications, the moisturizer can be continued twice daily as maintenance.  Laundry and clothing: . Avoid laundry products with added color or perfumes. . Use unscented hypo-allergenic laundry products such as Tide free, Cheer free & gentle, and All free and clear.  . If the skin still seems dry or sensitive, you can  try double-rinsing the clothes. . Avoid tight or scratchy clothing such as wool. . Do not use fabric softeners or dyer sheets.

## 2020-12-15 ENCOUNTER — Encounter: Payer: Self-pay | Admitting: Nurse Practitioner

## 2020-12-15 ENCOUNTER — Encounter: Payer: Self-pay | Admitting: Allergy

## 2020-12-26 LAB — ALLERGENS W/TOTAL IGE AREA 2

## 2020-12-26 LAB — TRYPTASE: Tryptase: 4.9 ug/L (ref 2.2–13.2)

## 2020-12-26 LAB — CHRONIC URTICARIA: cu index: <2.2 (ref <10)

## 2020-12-28 ENCOUNTER — Other Ambulatory Visit: Payer: Self-pay | Admitting: Cardiovascular Disease

## 2021-01-09 NOTE — Progress Notes (Signed)
Follow Up Note  RE: [name] MRN: [id number] DOB: May 14, 1930 Date of Office Visit: 01/10/2021  Referring provider: Flossie Buffy, NP Primary care provider: Flossie Buffy, NP  Chief Complaint: Rash  History of Present Illness: I had the pleasure of seeing [name] for a follow up visit at the [facility] [place] [facility] [place] on 01/10/2021. He is a 87 y.o. male, who is being followed for rash. His previous allergy office visit was on 12/13/2020 with Dr. Maudie Daugherty. Today is a regular follow up visit.  Rash The rash is about 80-90% improved. Patient ran out of zyrtec yesterday and Pepcid a few days ago and noted some increased rash.  Never took hydroxyzine.   Patient using the moisturizer twice a day with good benefit.  Needs refills on medications.  Assessment and Plan: [name] is a 87 y.o. male with: Pruritic rash Past history - Pruritic rash started a few months ago. Had Covid-19 in Dec 2021 s/p mab infusion but not sure if the rash was there before or after his infection. Denies any other changes in diet, medications or personal care products. 2022 bloodwork - TSH, CBC diff, ESR, Crp, CMP, ANA, alpha gal, basic food panel unremarkable. Seen by dermatology and had antihistamines and prednisone with unknown benefit. Skin biopsy not done due to financial reasons.  Interim history - 80-90% improved with below regimen; CU, tryptase and environmental panel were all normal. Never took hydroxyzine. Noticing some itching/rash after he ran out of Pepcid.  Moisturizer: Triamcinolone-Eucerin - may decrease to once a day. Itching: Take Zyrtec 74m in the evening.  Take famotidine (pepcid) 220mtwice a day.  Continue to moisturize daily with fragrance free and dye free products.  No dryer sheets or fabric softeners.   Medications step down schedule:   If no issues after 1 month then:   Decrease Pepcid to 2011mnce a day. Continue with zyrtec 44m31mce a day. If no  rash/itching for 4 weeks then:  Stop Pepcid. Continue with zyrtec 44mg80me a day. If no rash/itching for 4 weeks then:  Stop zyrtec.   Return in about 4 months (around 05/12/2021).  Meds ordered this encounter  Medications  . cetirizine (ZYRTEC ALLERGY) 10 MG tablet    Sig: Take 1 tablet (10 mg total) by mouth at bedtime.    Dispense:  30 tablet    Refill:  5  . famotidine (PEPCID) 20 MG tablet    Sig: Take 1 tablet (20 mg total) by mouth 2 (two) times daily.    Dispense:  60 tablet    Refill:  5  . triamcinolone ointment (KENALOG) 0.1 %    Sig: Apply 1 application topically 2 (two) times daily. Apply to whole body as a moisturizer twice a day.    Dispense:  453 g    Refill:  3    Mix 1:1 with Eucerin. Please keep on file.   Lab Orders  No laboratory test(s) ordered today    Diagnostics: None.  Medication List:  Current Outpatient Medications  Medication Sig Dispense Refill  . amLODipine (NORVASC) 5 MG tablet Take 1 tablet (5 mg total) by mouth daily. 90 tablet 3  . aspirin 81 MG tablet Take 81 mg by mouth daily.    . cetirizine (ZYRTEC ALLERGY) 10 MG tablet Take 1 tablet (10 mg total) by mouth at bedtime. 30 tablet 5  . clopidogrel (PLAVIX) 75 MG tablet Take 1 tablet by mouth once daily 90 tablet 3  .  famotidine (PEPCID) 20 MG tablet Take 1 tablet (20 mg total) by mouth 2 (two) times daily. 60 tablet 5  . fish oil-omega-3 fatty acids 1000 MG capsule Take 2 g by mouth daily.    Marland Kitchen glucosamine-chondroitin 500-400 MG tablet Take 1 tablet by mouth daily in the afternoon.    Marland Kitchen levothyroxine (SYNTHROID) 88 MCG tablet Take 1 tablet (88 mcg total) by mouth daily before breakfast. 90 tablet 3  . pravastatin (PRAVACHOL) 40 MG tablet Take 1 tablet (40 mg total) by mouth daily. 90 tablet 3  . triamcinolone ointment (KENALOG) 0.1 % Apply 1 application topically 2 (two) times daily. Apply to whole body as a moisturizer twice a day. 453 g 3   No current facility-administered medications  for this visit.   Allergies: No Known Allergies I reviewed his past medical history, social history, family history, and environmental history and no significant changes have been reported from his previous visit.  Review of Systems  Constitutional: Negative for appetite change, chills, fever and unexpected weight change.  HENT: Negative for congestion and rhinorrhea.   Eyes: Negative for itching.  Respiratory: Negative for cough, chest tightness, shortness of breath and wheezing.   Cardiovascular: Negative for chest pain.  Gastrointestinal: Negative for abdominal pain.  Genitourinary: Negative for difficulty urinating.  Skin: Positive for rash.  Neurological: Negative for headaches.   Objective: BP (!) 124/50 (BP Location: Right Arm, Patient Position: Sitting, Cuff Size: Normal)   Pulse 60   Temp (!) 97.5 F (36.4 C) (Tympanic)   Resp 20   SpO2 99%  There is no height or weight on file to calculate BMI. Physical Exam Vitals and nursing note reviewed.  Constitutional:      Appearance: Normal appearance. He is well-developed.  HENT:     Head: Normocephalic and atraumatic.     Right Ear: External ear normal.     Left Ear: External ear normal.     Nose: Nose normal.     Mouth/Throat:     Mouth: Mucous membranes are moist.     Pharynx: Oropharynx is clear.  Eyes:     Conjunctiva/sclera: Conjunctivae normal.  Cardiovascular:     Rate and Rhythm: Normal rate and regular rhythm.     Heart sounds: Murmur heard.  No friction rub. No gallop.   Pulmonary:     Effort: Pulmonary effort is normal.     Breath sounds: Normal breath sounds. No wheezing, rhonchi or rales.  Musculoskeletal:     Cervical back: Neck supple.  Skin:    General: Skin is warm and dry.     Findings: Rash present.     Comments: Rash is much improved, there are still some scattered erythematous areas mainly on the torso.   Neurological:     Mental Status: He is alert and oriented to person, place, and time.   Psychiatric:        Behavior: Behavior normal.    Previous notes and tests were reviewed. The plan was reviewed with the patient/family, and all questions/concerned were addressed.  It was my pleasure to see [name] today and participate in his care. Please feel free to contact me with any questions or concerns.  Sincerely,  Rexene Alberts, DO Allergy & Immunology  [facility] office: [place] office: [phone]

## 2021-01-10 ENCOUNTER — Other Ambulatory Visit: Payer: Self-pay

## 2021-01-10 ENCOUNTER — Encounter: Payer: Self-pay | Admitting: Allergy

## 2021-01-10 ENCOUNTER — Ambulatory Visit: Payer: Medicare Other | Admitting: Allergy

## 2021-01-10 VITALS — BP 124/50 | HR 60 | Temp 97.5°F | Resp 20

## 2021-01-10 DIAGNOSIS — L282 Other prurigo: Secondary | ICD-10-CM

## 2021-01-10 MED ORDER — CETIRIZINE HCL 10 MG PO TABS: 10.0000 mg | ORAL_TABLET | Freq: Every day | ORAL | 5 refills | Status: DC

## 2021-01-10 MED ORDER — FAMOTIDINE 20 MG PO TABS
20.0000 mg | ORAL_TABLET | Freq: Two times a day (BID) | ORAL | 5 refills | Status: DC
Start: 2021-01-10 — End: 2022-11-23

## 2021-01-10 MED ORDER — TRIAMCINOLONE ACETONIDE 0.1 % EX OINT: 1.0000 "application " | TOPICAL_OINTMENT | Freq: Two times a day (BID) | CUTANEOUS | 3 refills | Status: DC

## 2021-01-10 NOTE — Assessment & Plan Note (Signed)
Past history - Pruritic rash started a few months ago. Had Covid-19 in Dec 2021 s/p mab infusion but not sure if the rash was there before or after his infection. Denies any other changes in diet, medications or personal care products. 2022 bloodwork - TSH, CBC diff, ESR, Crp, CMP, ANA, alpha gal, basic food panel unremarkable. Seen by dermatology and had antihistamines and prednisone with unknown benefit. Skin biopsy not done due to financial reasons.  Interim history - 80-90% improved with below regimen; CU, tryptase and environmental panel were all normal. Never took hydroxyzine. Noticing some itching/rash after he ran out of Pepcid.  Moisturizer: Triamcinolone-Eucerin - may decrease to once a day. Itching: Take Zyrtec 69m in the evening.  Take famotidine (pepcid) 274mtwice a day.  Continue to moisturize daily with fragrance free and dye free products.  No dryer sheets or fabric softeners.   Medications step down schedule:   If no issues after 1 month then:   Decrease Pepcid to 2045mnce a day. Continue with zyrtec 80m50mce a day. If no rash/itching for 4 weeks then:  Stop Pepcid. Continue with zyrtec 80mg74me a day. If no rash/itching for 4 weeks then:  Stop zyrtec.

## 2021-01-10 NOTE — Patient Instructions (Addendum)
Skin: Moisturizer: Triamcinolone-Eucerin - may decrease to once a day.  Itching: Take Zyrtec 10mg  in the evening.  Take famotidine (pepcid) 20mg  twice a day.   Continue to moisturize daily with fragrance free and dye free products.  No dryer sheets or fabric softeners.   Medications step down schedule:   If no issues after 1 month then:   Decrease Pepcid to 20mg  once a day. Continue with zyrtec 10mg  once a day. If no rash/itching for 4 weeks then:  Stop Pepcid. Continue with zyrtec 10mg  once a day.      If no rash/itching for 4 weeks then:  Stop zyrtec.    If you get the rashes then go back to the dose where you didn't have any symptoms.   Follow up in 4 months or sooner if needed.   Skin care recommendations  Bath time: . Always use lukewarm water. AVOID very hot or cold water. Keep bathing time to 5-10 minutes. . Do NOT use bubble bath. . Use a mild soap and use just enough to wash the dirty areas. . Do NOT scrub skin vigorously.  . After bathing, pat dry your skin with a towel. Do NOT rub or scrub the skin.  Moisturizers and prescriptions:  . ALWAYS apply moisturizers immediately after bathing (within 3 minutes). This helps to lock-in moisture. . Use the moisturizer several times a day over the whole body. summer moisturizers include: Aveeno, CeraVe, Cetaphil. winter moisturizers include: Aquaphor, Vaseline, Cerave, Cetaphil, Eucerin, Vanicream. . When using moisturizers along with medications, the moisturizer should be applied about one hour after applying the medication to prevent diluting effect of the medication or moisturize around where you applied the medications. When not using medications, the moisturizer can be continued twice daily as maintenance.  Laundry and clothing: . Avoid laundry products with added color or perfumes. . Use unscented hypo-allergenic laundry products such as Tide free, Cheer free & gentle, and All free and clear.   . If the skin still seems dry or sensitive, you can try double-rinsing the clothes. . Avoid tight or scratchy clothing such as wool. . Do not use fabric softeners or dyer sheets.

## 2021-01-19 ENCOUNTER — Other Ambulatory Visit: Payer: Self-pay | Admitting: Family Medicine

## 2021-01-19 ENCOUNTER — Other Ambulatory Visit: Payer: Self-pay

## 2021-01-19 ENCOUNTER — Encounter: Payer: Self-pay | Admitting: Nurse Practitioner

## 2021-01-19 ENCOUNTER — Ambulatory Visit (INDEPENDENT_AMBULATORY_CARE_PROVIDER_SITE_OTHER): Payer: Medicare Other

## 2021-01-19 ENCOUNTER — Ambulatory Visit: Payer: Medicare Other | Admitting: Nurse Practitioner

## 2021-01-19 VITALS — BP 134/60 | Temp 97.7°F | Wt 163.6 lb

## 2021-01-19 DIAGNOSIS — S2232XA Fracture of one rib, left side, initial encounter for closed fracture: Secondary | ICD-10-CM

## 2021-01-19 DIAGNOSIS — R0789 Other chest pain: Secondary | ICD-10-CM

## 2021-01-19 DIAGNOSIS — W19XXXA Unspecified fall, initial encounter: Secondary | ICD-10-CM

## 2021-01-19 MED ORDER — TRAMADOL-ACETAMINOPHEN 37.5-325 MG PO TABS: 1.0000 | ORAL_TABLET | Freq: Four times a day (QID) | ORAL | 0 refills | Status: AC | PRN

## 2021-01-19 MED ORDER — TRAMADOL-ACETAMINOPHEN 37.5-325 MG PO TABS: 1.0000 | ORAL_TABLET | Freq: Four times a day (QID) | ORAL | 0 refills | Status: DC | PRN

## 2021-01-19 NOTE — Telephone Encounter (Signed)
Please see message and advise.  Thank you. ° °

## 2021-01-19 NOTE — Patient Instructions (Addendum)
Go to lab for x-ray Use ultracet for pain  Tramadol; Acetaminophen Oral Tablets What is this medicine? TRAMADOL; ACETAMINOPHEN (TRA ma dol; ea set a MEE noe fen) is a pain reliever. It is used to treat moderate pain. This medicine may be used for other purposes; ask your health care provider or pharmacist if you have questions. COMMON BRAND NAME(S): Ultracet What should I tell my health care provider before I take this medicine? They need to know if you have any of these conditions:  brain tumor  drug abuse or addiction  head injury  heart disease  if you often drink alcohol  kidney disease or trouble passing urine  low adrenal gland function  lung disease, asthma, or breathing problems  seizures  stomach or intestine problems  taken an MAOI like Marplan, Nardil, or Parnate in the last 14 days  an unusual or allergic reaction to tramadol, acetaminophen, codeine, other opioid analgesics, other medicines, foods, dyes, or preservatives  pregnant or trying to get pregnant  breast-feeding How should I use this medicine? Take this medicine by mouth with a full glass of water. Follow the directions on the prescription label. If the medicine upsets your stomach, take it with food or milk. Do not take your medicine more often than directed. A special MedGuide will be given to you by the pharmacist with each prescription and refill. Be sure to read this information carefully each time. Talk to your pediatrician regarding the use of this medicine in children. Special care may be needed. This medicine is not for use in children less than 100 years of age. Do not give this medicine to a child younger than 29 years of age after surgery to remove the tonsils and/or adenoids. Overdosage: If you think you have taken too much of this medicine contact a poison control center or emergency room at once. NOTE: This medicine is only for you. Do not share this medicine with others. What if I miss  a dose? If you miss a dose, take it as soon as you can. If it is almost time for your next dose, take only that dose. Do not take double or extra doses. What may interact with this medicine? Do not take this medication with any of the following medicines:  MAOIs like Carbex, Eldepryl, Marplan, Nardil, and Parnate This medicine may also interact with the following medications:  alcohol  antihistamines for allergy, cough and cold  certain medicines for anxiety or sleep  certain medicines for depression like amitriptyline, fluoxetine, sertraline  certain medicines for migraine headache like almotriptan, eletriptan, frovatriptan, naratriptan, rizatriptan, sumatriptan, zolmitriptan  certain medicines for seizures like carbamazepine, oxcarbazepine, phenobarbital, primidone  certain medicines that treat or prevent blood clots like warfarin  digoxin  furazolidone  general anesthetics like halothane, isoflurane, methoxyflurane, propofol  linezolid  local anesthetics like lidocaine, pramoxine, tetracaine  medicines that relax muscles for surgery  other narcotic medicines for pain or cough  phenothiazines like chlorpromazine, mesoridazine, prochlorperazine, thioridazine  procarbazine This list may not describe all possible interactions. Give your health care provider a list of all the medicines, herbs, non-prescription drugs, or dietary supplements you use. Also tell them if you smoke, drink alcohol, or use illegal drugs. Some items may interact with your medicine. What should I watch for while using this medicine? Tell your health care provider if your pain does not go away, if it gets worse, or if you have new or a different type of pain. You may develop tolerance to  this drug. Tolerance means that you will need a higher dose of the drug for pain relief. Tolerance is normal and is expected if you take this drug for a long time. Do not suddenly stop taking your drug because you may  develop a severe reaction. Your body becomes used to the drug. This does NOT mean you are addicted. Addiction is a behavior related to getting and using a drug for a nonmedical reason. If you have pain, you have a medical reason to take pain drug. Your health care provider will tell you how much drug to take. If your health care provider wants you to stop the drug, the dose will be slowly lowered over time to avoid any side effects. If you take other drugs that also cause drowsiness like other narcotic pain drugs, benzodiazepines, or other drugs for sleep, you may have more side effects. Give your health care provider a list of all drugs you use. He or she will tell you how much drug to take. Do not take more drug than directed. Get emergency help right away if you have trouble breathing or are unusually tired or sleepy. Talk to your health care provider about naloxone and how to get it. Naloxone is an emergency drug used for an opioid overdose. An overdose can happen if you take too much opioid. It can also happen if an opioid is taken with some other drugs or substances, like alcohol. Know the symptoms of an overdose, like trouble breathing, unusually tired or sleepy, or not being able to respond or wake up. Make sure to tell caregivers and close contacts where it is stored. Make sure they know how to use it. After naloxone is given, you must get emergency help right away. Naloxone is a temporary treatment. Repeat doses may be needed. Do not take other drugs that contain acetaminophen with this drug. Many non-prescription drugs contain acetaminophen. Always read labels carefully. If you have questions, ask your health care provider. If you take too much acetaminophen, get medical help right away. Too much acetaminophen can be very dangerous and cause liver damage. Even if you do not have symptoms, it is important to get help right away. This drug may cause serious skin reactions. They can happen weeks to  months after starting the drug. Contact your health care provider right away if you notice fevers or flu-like symptoms with a rash. The rash may be red or purple and then turn into blisters or peeling of the skin. Or, you might notice a red rash with swelling of the face, lips or lymph nodes in your neck or under your arms. You may get drowsy or dizzy. Do not drive, use machinery, or do anything that needs mental alertness until you know how this drug affects you. Do not stand up or sit up quickly, especially if you are an older patient. This reduces the risk of dizzy or fainting spells. Alcohol may interfere with the effect of this drug. Avoid alcoholic drinks. This drug will cause constipation. If you do not have a bowel movement for 3 days, call your health care provider. Your mouth may get dry. Chewing sugarless gum or sucking hard candy and drinking plenty of water may help. Contact your health care provider if the problem does not go away or is severe. What side effects may I notice from receiving this medicine? Side effects that you should report to your doctor or health care professional as soon as possible:  allergic reactions like skin  rash, itching or hives, swelling of the face, lips, or tongue  confusion  kidney injury (trouble passing urine or change in the amount of urine)  low adrenal gland function (nausea; vomiting; loss of appetite; unusually weak or tired; dizziness; low blood pressure)  low blood pressure (dizziness; feeling faint or lightheaded, falls; unusually weak or tired)  redness, blistering, peeling or loosening of the skin, including inside the mouth  seizures  serotonin syndrome (irritable; confusion; diarrhea; fast or irregular heartbeat; muscle twitching; stiff muscles; trouble walking; sweating; high fever; seizures; chills; vomiting)  trouble breathing  yellowing of the eyes or skin Side effects that usually do not require medical attention (report to your  doctor or health care professional if they continue or are bothersome):  constipation  dry mouth  nausea, vomiting  tiredness This list may not describe all possible side effects. Call your doctor for medical advice about side effects. You may report side effects to FDA at 1-800-FDA-1088. Where should I keep my medicine? Keep out of the reach of children. Tramadol is a morphine-like drug that can be abused. Keep your medicine in a safe place to protect it from theft. Do not share this medicine with anyone. Selling or giving away this medicine is dangerous and is against the law. This medicine may cause accidental overdose and death if it taken by other adults, children, or pets. Mix any unused medicine with a substance like cat litter or coffee grounds. Then throw the medicine away in a sealed container like a sealed bag or a coffee can with a lid. Do not use the medicine after the expiration date. Store at room temperature between 15 and 30 degrees C (59 and 86 degrees F). NOTE: This sheet is a summary. It may not cover all possible information. If you have questions about this medicine, talk to your doctor, pharmacist, or health care provider.  2021 Elsevier/Gold Standard (2019-09-09 16:52:31)

## 2021-01-19 NOTE — Telephone Encounter (Signed)
Rye from [facility] - [place], [place] - [address] is calling to have pt's traMADol-acetaminophen (ULTRACET) 37.5-325 MG tablet [[id number]] sent to [facility] at [address]. At his pharmacy tramadol is out of stock.

## 2021-01-22 ENCOUNTER — Encounter: Payer: Self-pay | Admitting: Nurse Practitioner

## 2021-01-22 NOTE — Progress Notes (Signed)
Subjective:  Patient ID: [name], male    DOB: [birth year]  Age: 87 y.o. MRN: [id number]  CC: Acute Visit (Pt fell x 2 days ago when going to get the mail in the rain, missed a step on his porch and landed in the grass. Pt is now having severe pain in the left side of the body under the ribcage, states feels as if muscle spasms started in the middle of the night last night and now it makes it hard to breath. )  Fall The accident occurred 2 days ago. The fall occurred while walking. He fell from an unknown height. He landed on grass. There was no blood loss. The point of impact was the left shoulder and left elbow (left side of chest). Pain location: left chest wall. The pain is at a severity of 10/10. The pain is severe. The symptoms are aggravated by movement. Pertinent negatives include no headaches, loss of consciousness, numbness or tingling. He has tried acetaminophen for the symptoms. The treatment provided mild relief.   Accompanied by wife  Reviewed past Medical, Social and Family history today.  Outpatient Medications Prior to Visit  Medication Sig Dispense Refill  . amLODipine (NORVASC) 5 MG tablet Take 1 tablet (5 mg total) by mouth daily. 90 tablet 3  . aspirin 81 MG tablet Take 81 mg by mouth daily.    . cetirizine (ZYRTEC ALLERGY) 10 MG tablet Take 1 tablet (10 mg total) by mouth at bedtime. 30 tablet 5  . clopidogrel (PLAVIX) 75 MG tablet Take 1 tablet by mouth once daily 90 tablet 3  . famotidine (PEPCID) 20 MG tablet Take 1 tablet (20 mg total) by mouth 2 (two) times daily. 60 tablet 5  . fish oil-omega-3 fatty acids 1000 MG capsule Take 2 g by mouth daily.    Marland Kitchen glucosamine-chondroitin 500-400 MG tablet Take 1 tablet by mouth daily in the afternoon.    Marland Kitchen levothyroxine (SYNTHROID) 88 MCG tablet Take 1 tablet (88 mcg total) by mouth daily before breakfast. 90 tablet 3  . pravastatin (PRAVACHOL) 40 MG tablet Take 1 tablet (40 mg total) by mouth daily. 90 tablet 3  .  triamcinolone ointment (KENALOG) 0.1 % Apply 1 application topically 2 (two) times daily. Apply to whole body as a moisturizer twice a day. 453 g 3   No facility-administered medications prior to visit.   ROS See HPI  Objective:  BP 134/60 (BP Location: Right Arm, Patient Position: Sitting, Cuff Size: Large)   Temp 97.7 F (36.5 C) (Temporal)   Wt 163 lb 9.6 oz (74.2 kg)   SpO2 99%   BMI 24.88 kg/m   Physical Exam Vitals reviewed.  Cardiovascular:     Rate and Rhythm: Normal rate and regular rhythm.     Pulses: Normal pulses.  Pulmonary:     Effort: Pulmonary effort is normal.     Breath sounds: Normal breath sounds.  Chest:     Chest wall: Tenderness present.  Skin:    Findings: No bruising.  Neurological:     Mental Status: He is alert and oriented to person, place, and time.  Psychiatric:        Mood and Affect: Mood normal.        Behavior: Behavior normal.        Thought Content: Thought content normal.     Assessment & Plan:  This visit occurred during the SARS-CoV-2 public health emergency.  Safety protocols were in place, including screening questions prior  to the visit, additional usage of staff PPE, and extensive cleaning of exam room while observing appropriate contact time as indicated for disinfecting solutions.   Adain was seen today for acute visit.  Diagnoses and all orders for this visit:  Fall, initial encounter -     DG Ribs Unilateral Left -     Discontinue: traMADol-acetaminophen (ULTRACET) 37.5-325 MG tablet; Take 1 tablet by mouth every 6 (six) hours as needed for up to 5 days.  Closed fracture of one rib of left side, initial encounter -     DG Ribs Unilateral Left -     Discontinue: traMADol-acetaminophen (ULTRACET) 37.5-325 MG tablet; Take 1 tablet by mouth every 6 (six) hours as needed for up to 5 days.  Use ultracet for pain Perform deep breathing exercise 2-3x/day Splint chest wall with pillow if coughing. Call office if you develop  fever, cough, SOB, or worsening pain.  Problem List Items Addressed This Visit   None   Visit Diagnoses    Fall, initial encounter    -  Primary   Relevant Orders   DG Ribs Unilateral Left (Completed)   Closed fracture of one rib of left side, initial encounter       Relevant Orders   DG Ribs Unilateral Left (Completed)      Follow-up: No follow-ups on file.  Alysia Penna, NP

## 2021-02-20 ENCOUNTER — Encounter: Payer: Self-pay | Admitting: Nurse Practitioner

## 2021-02-21 ENCOUNTER — Other Ambulatory Visit: Payer: Self-pay

## 2021-02-22 ENCOUNTER — Encounter: Payer: Self-pay | Admitting: Nurse Practitioner

## 2021-02-22 ENCOUNTER — Ambulatory Visit (INDEPENDENT_AMBULATORY_CARE_PROVIDER_SITE_OTHER): Payer: Medicare Other | Admitting: Nurse Practitioner

## 2021-02-22 VITALS — BP 122/60 | HR 60 | Temp 97.6°F | Ht 69.0 in | Wt 163.0 lb

## 2021-02-22 DIAGNOSIS — Z23 Encounter for immunization: Secondary | ICD-10-CM | POA: Diagnosis not present

## 2021-02-22 DIAGNOSIS — L282 Other prurigo: Secondary | ICD-10-CM | POA: Diagnosis not present

## 2021-02-22 NOTE — Patient Instructions (Signed)
Maintain current medications  

## 2021-02-22 NOTE — Progress Notes (Signed)
Subjective:  Patient ID: [name], male    DOB: [birth year]  Age: 87 y.o. MRN: [id number]  CC: Acute Visit (Pt wife states current medication that he is taking for his rash is making him very drowsy and unmotivated. )  HPI Accompanied by wife She is concerned zrytec might be causing increase somnolence. She thinks he is sleeping more than usual in last 35months. Mr. Levitz states he stated a [place] job [place]. He works 6hrs 4days a week for last 59month. He feel tired after work, so quality of sleep has improved. He denies any increased daytime fatigue or dizziness or fogginess or confusion or joint pain or SOB. He was instructed by Dr. Selena Batten to try weaning off zyrtec and famotidine if no rash. He has developed another exacerbation in last 1week, hence still taking famotidine 20mg  daily and zyrtec 10mg  at HS.  BP Readings from Last 3 Encounters:  02/22/21 122/60  01/19/21 134/60  01/10/21 (!) 124/50   Wt Readings from Last 3 Encounters:  02/22/21 163 lb (73.9 kg)  01/19/21 163 lb 9.6 oz (74.2 kg)  12/13/20 163 lb 2.3 oz (74 kg)   Reviewed past Medical, Social and Family history today.  Outpatient Medications Prior to Visit  Medication Sig Dispense Refill  . amLODipine (NORVASC) 5 MG tablet Take 1 tablet (5 mg total) by mouth daily. 90 tablet 3  . aspirin 81 MG tablet Take 81 mg by mouth daily.    . cetirizine (ZYRTEC ALLERGY) 10 MG tablet Take 1 tablet (10 mg total) by mouth at bedtime. 30 tablet 5  . clopidogrel (PLAVIX) 75 MG tablet Take 1 tablet by mouth once daily 90 tablet 3  . famotidine (PEPCID) 20 MG tablet Take 1 tablet (20 mg total) by mouth 2 (two) times daily. 60 tablet 5  . fish oil-omega-3 fatty acids 1000 MG capsule Take 2 g by mouth daily.    01/21/21 glucosamine-chondroitin 500-400 MG tablet Take 1 tablet by mouth daily in the afternoon.    02/10/21 levothyroxine (SYNTHROID) 88 MCG tablet Take 1 tablet (88 mcg total) by mouth daily before breakfast. 90 tablet 3  .  pravastatin (PRAVACHOL) 40 MG tablet Take 1 tablet (40 mg total) by mouth daily. 90 tablet 3  . triamcinolone ointment (KENALOG) 0.1 % Apply 1 application topically 2 (two) times daily. Apply to whole body as a moisturizer twice a day. 453 g 3   No facility-administered medications prior to visit.    ROS See HPI  Objective:  BP 122/60 (BP Location: Right Arm, Patient Position: Sitting, Cuff Size: Large)   Pulse 60   Temp 97.6 F (36.4 C) (Oral)   Ht 5\' 9"  (1.753 m)   Wt 163 lb (73.9 kg)   SpO2 99%   BMI 24.07 kg/m   Physical Exam Skin:    Findings: Rash present.  Neurological:     Mental Status: He is alert and oriented to person, place, and time.     Cranial Nerves: No cranial nerve deficit.  Psychiatric:        Mood and Affect: Mood normal.        Behavior: Behavior normal.        Thought Content: Thought content normal.    Assessment & Plan:  This visit occurred during the SARS-CoV-2 public health emergency.  Safety protocols were in place, including screening questions prior to the visit, additional usage of staff PPE, and extensive cleaning of exam room while observing appropriate contact  time as indicated for disinfecting solutions.   Zamari was seen today for acute visit.  Diagnoses and all orders for this visit:  Pruritic rash  Need for diphtheria-tetanus-pertussis (Tdap) vaccine -     Tdap vaccine greater than or equal to 7yo IM  Other orders -     Cancel: Thyroid Panel With TSH  I reassured Ms. Baldree that his symptoms are due to increasing working hours and not related to current medication. I gave them the option to decrease zyrtec dose to 5mg  at hs, but Mr. Sally declined stating he dose not think that is necessary.  Problem List Items Addressed This Visit      Musculoskeletal and Integument   Pruritic rash - Primary    Other Visit Diagnoses    Need for diphtheria-tetanus-pertussis (Tdap) vaccine       Relevant Orders   Tdap vaccine greater than or  equal to 7yo IM (Completed)      Follow-up: Return in about 3 months (around 05/24/2021) for DM and HTN, hyperlipidemia, hypothyroidism (fasting, 05/26/2021).  , NP

## 2021-03-06 ENCOUNTER — Other Ambulatory Visit: Payer: Self-pay | Admitting: Allergy

## 2021-03-14 ENCOUNTER — Encounter: Payer: Self-pay | Admitting: Allergy

## 2021-04-19 ENCOUNTER — Encounter: Payer: Self-pay | Admitting: Nurse Practitioner

## 2021-04-19 DIAGNOSIS — L298 Other pruritus: Secondary | ICD-10-CM

## 2021-04-19 DIAGNOSIS — L282 Other prurigo: Secondary | ICD-10-CM

## 2021-06-28 ENCOUNTER — Ambulatory Visit: Payer: Medicare Other | Admitting: Nurse Practitioner

## 2021-08-02 ENCOUNTER — Ambulatory Visit: Payer: Medicare Other

## 2021-08-27 ENCOUNTER — Encounter: Payer: Self-pay | Admitting: Nurse Practitioner

## 2021-08-27 ENCOUNTER — Ambulatory Visit (INDEPENDENT_AMBULATORY_CARE_PROVIDER_SITE_OTHER): Payer: Medicare Other

## 2021-08-27 DIAGNOSIS — Z23 Encounter for immunization: Secondary | ICD-10-CM | POA: Diagnosis not present

## 2021-08-27 NOTE — Patient Instructions (Signed)
After receiving consent, pt was given the flu vaccine. Pt tolerated injection well and instructed to stay in office for 15 minutes to be sure of no adverse reactions. Repeat vaccine in 1 year.

## 2021-09-05 ENCOUNTER — Telehealth: Payer: Self-pay | Admitting: Nurse Practitioner

## 2021-09-06 ENCOUNTER — Ambulatory Visit: Payer: Medicare Other | Admitting: Family Medicine

## 2021-09-06 NOTE — Telephone Encounter (Signed)
Appt scheduled for 12pm on 09/07/21 and approved per [facility] Nche-NP

## 2021-09-07 ENCOUNTER — Ambulatory Visit (INDEPENDENT_AMBULATORY_CARE_PROVIDER_SITE_OTHER): Payer: Medicare Other

## 2021-09-07 ENCOUNTER — Ambulatory Visit (INDEPENDENT_AMBULATORY_CARE_PROVIDER_SITE_OTHER): Payer: Medicare Other | Admitting: Nurse Practitioner

## 2021-09-07 ENCOUNTER — Encounter: Payer: Self-pay | Admitting: Nurse Practitioner

## 2021-09-07 ENCOUNTER — Ambulatory Visit: Payer: Medicare Other | Admitting: Nurse Practitioner

## 2021-09-07 ENCOUNTER — Other Ambulatory Visit: Payer: Self-pay

## 2021-09-07 VITALS — BP 100/60 | HR 60 | Temp 97.7°F | Wt 164.0 lb

## 2021-09-07 DIAGNOSIS — M5442 Lumbago with sciatica, left side: Secondary | ICD-10-CM | POA: Diagnosis not present

## 2021-09-07 DIAGNOSIS — G8929 Other chronic pain: Secondary | ICD-10-CM | POA: Diagnosis not present

## 2021-09-07 MED ORDER — TRAMADOL-ACETAMINOPHEN 37.5-325 MG PO TABS: 1.0000 | ORAL_TABLET | Freq: Three times a day (TID) | ORAL | 0 refills | Status: AC | PRN

## 2021-09-07 MED ORDER — CYCLOBENZAPRINE HCL 5 MG PO TABS: 5.0000 mg | ORAL_TABLET | Freq: Every day | ORAL | 0 refills | Status: DC

## 2021-09-07 NOTE — Patient Instructions (Signed)
Ok to use colace 100mg  BID or senna 1tab at bedtime for constipation.  Go to lab for x-ray

## 2021-09-07 NOTE — Progress Notes (Signed)
Subjective:  Patient ID: [name], male    DOB: 1930-07-21  Age: 87 y.o. MRN: [id number]  CC: Acute Visit (Pt c/o of left hip pain and lower back x 1 month. Pt states a couple of weeks ago he had to pick his wife up off of the floor and since than the pain has intensified. Pt states he normally stretches and exercise to help with the pain when this happened in the past but this time he is unable to get any relief. )  Back Pain This is a recurrent (acute on chronic) problem. The current episode started 1 to 4 weeks ago. The problem occurs constantly. The problem has been gradually improving since onset. The pain is present in the gluteal and lumbar spine. The quality of the pain is described as aching and cramping. The pain radiates to the left thigh. The pain is moderate. The pain is The same all the time. The symptoms are aggravated by bending and twisting. Stiffness is present All day. Pertinent negatives include no abdominal pain, bladder incontinence, bowel incontinence, chest pain, dysuria, fever, headaches, leg pain, numbness, paresis, paresthesias, pelvic pain, perianal numbness, tingling, weakness or weight loss. (Difficulty with bowel movement in last 3days. Normal goes once a day.) Risk factors include poor posture. He has tried analgesics, chiropractic manipulation, home exercises and ice for the symptoms. The treatment provided mild relief.  Unable to take NSAIDs due to current plavix use.  Reviewed past Medical, Social and Family history today.  Outpatient Medications Prior to Visit  Medication Sig Dispense Refill   amLODipine (NORVASC) 5 MG tablet Take 1 tablet (5 mg total) by mouth daily. 90 tablet 3   aspirin 81 MG tablet Take 81 mg by mouth daily.     clopidogrel (PLAVIX) 75 MG tablet Take 1 tablet by mouth once daily 90 tablet 3   famotidine (PEPCID) 20 MG tablet Take 1 tablet (20 mg total) by mouth 2 (two) times daily. 60 tablet 5   fish oil-omega-3 fatty acids 1000  MG capsule Take 2 g by mouth daily.     glucosamine-chondroitin 500-400 MG tablet Take 1 tablet by mouth daily in the afternoon.     levothyroxine (SYNTHROID) 88 MCG tablet Take 1 tablet (88 mcg total) by mouth daily before breakfast. 90 tablet 3   pravastatin (PRAVACHOL) 40 MG tablet Take 1 tablet (40 mg total) by mouth daily. 90 tablet 3   cetirizine (ZYRTEC ALLERGY) 10 MG tablet Take 1 tablet (10 mg total) by mouth at bedtime. (Patient not taking: Reported on 09/07/2021) 30 tablet 5   triamcinolone ointment (KENALOG) 0.1 % APPLY  1 APPLICATION TOPICALLY 2 TIMES DAILY. APPLY TO WHOLE BODY AS MOISTURIZER TWICE A DAY (Patient not taking: Reported on 09/07/2021) 453 g 0   No facility-administered medications prior to visit.    ROS See HPI  Objective:  BP 100/60 (BP Location: Left Arm, Patient Position: Sitting, Cuff Size: Normal)   Pulse 60   Temp 97.7 F (36.5 C) (Temporal)   Wt 164 lb (74.4 kg)   SpO2 98%   BMI 24.22 kg/m   Physical Exam Vitals reviewed.  Constitutional:      General: He is not in acute distress. Abdominal:     General: There is no distension.     Palpations: Abdomen is soft.     Tenderness: There is no abdominal tenderness. There is no guarding.  Musculoskeletal:     Lumbar back: Spasms and tenderness present. No swelling, lacerations  or bony tenderness. Normal range of motion. Negative right straight leg raise test and negative left straight leg raise test. No scoliosis.     Right hip: Normal.     Left hip: Normal.     Right upper leg: Normal.     Left upper leg: Normal.     Right lower leg: No edema.     Left lower leg: No edema.  Skin:    Findings: No erythema or rash.  Neurological:     Mental Status: He is alert and oriented to person, place, and time.   Assessment & Plan:  This visit occurred during the SARS-CoV-2 public health emergency.  Safety protocols were in place, including screening questions prior to the visit, additional usage of staff  PPE, and extensive cleaning of exam room while observing appropriate contact time as indicated for disinfecting solutions.   Maricus was seen today for acute visit.  Diagnoses and all orders for this visit:  Chronic left-sided low back pain with left-sided sciatica -     cyclobenzaprine (FLEXERIL) 5 MG tablet; Take 1 tablet (5 mg total) by mouth at bedtime. -     DG Lumbar Spine Complete -     traMADol-acetaminophen (ULTRACET) 37.5-325 MG tablet; Take 1 tablet by mouth every 8 (eight) hours as needed for up to 5 days. Ok to use colace 100mg  BID or senna 1tab at bedtime for constipation. Go to lab for x-ray  Problem List Items Addressed This Visit       Nervous and Auditory   Chronic left-sided low back pain with left-sided sciatica - Primary   Relevant Medications   cyclobenzaprine (FLEXERIL) 5 MG tablet   traMADol-acetaminophen (ULTRACET) 37.5-325 MG tablet   Other Relevant Orders   DG Lumbar Spine Complete    Follow-up: No follow-ups on file.  02-16-2000, NP

## 2021-11-06 ENCOUNTER — Ambulatory Visit (HOSPITAL_COMMUNITY)
Admission: RE | Admit: 2021-11-06 | Discharge: 2021-11-06 | Disposition: A | Discharge status: Home / Self Care | Payer: Medicare Other | Source: Ambulatory Visit | Attending: Internal Medicine | Admitting: Internal Medicine

## 2021-11-06 ENCOUNTER — Other Ambulatory Visit: Payer: Self-pay

## 2021-11-06 DIAGNOSIS — I6523 Occlusion and stenosis of bilateral carotid arteries: Secondary | ICD-10-CM | POA: Insufficient documentation

## 2021-11-19 ENCOUNTER — Telehealth: Payer: Self-pay | Admitting: Nurse Practitioner

## 2021-11-19 NOTE — Telephone Encounter (Signed)
I attempted to leave message for patient to call back and schedule Medicare Annual Wellness Visit (AWV) in office. No voice mail.  If not able to come in office, please offer to do virtually or by telephone.  Left office number and my jabber [phone].  Last AWV:08/01/2020  Please schedule at anytime with Nurse Health Advisor.

## 2021-11-20 ENCOUNTER — Ambulatory Visit: Payer: Medicare Other | Admitting: Cardiovascular Disease

## 2021-11-20 ENCOUNTER — Encounter: Payer: Self-pay | Admitting: Cardiovascular Disease

## 2021-11-20 ENCOUNTER — Other Ambulatory Visit: Payer: Self-pay

## 2021-11-20 VITALS — BP 126/66 | HR 57 | Ht 69.0 in | Wt 165.4 lb

## 2021-11-20 DIAGNOSIS — I2510 Atherosclerotic heart disease of native coronary artery without angina pectoris: Secondary | ICD-10-CM

## 2021-11-20 DIAGNOSIS — E785 Hyperlipidemia, unspecified: Secondary | ICD-10-CM

## 2021-11-20 DIAGNOSIS — Z79899 Other long term (current) drug therapy: Secondary | ICD-10-CM | POA: Diagnosis not present

## 2021-11-20 DIAGNOSIS — I350 Nonrheumatic aortic (valve) stenosis: Secondary | ICD-10-CM

## 2021-11-20 LAB — LIPID PANEL
Chol/HDL Ratio: 4.7 ratio (ref 0.0–5.0)
Cholesterol, Total: 135 mg/dL (ref 100–199)
HDL: 29 mg/dL — ABNORMAL LOW (ref >39)
LDL Chol Calc (NIH): 78 mg/dL (ref 0–99)
Triglycerides: 158 mg/dL — ABNORMAL HIGH (ref 0–149)
VLDL Cholesterol Cal: 28 mg/dL (ref 5–40)

## 2021-11-20 NOTE — Progress Notes (Signed)
[name] Date of Birth  [birth year]  [address]    [address] [place], [place]  [address] [phone]  Problem List: 1. CAD - PCI of RCA March, 2012 2. RBBB 3. Hypothyroidism 4. Carotid bruits 5. hyperlipidemia   [name] is an elderly gentleman with a history of coronary artery disease. He status post PTCA and stenting of the mid-right coronary artery in April of this year. He's done well since I last saw him. He's not had any episodes of chest pain or shortness of breath. He's been tolerating his medications without any difficulty.  No angina. No dyspnea.  Exercising regularly - golf 1 day a week. Works out in [employer] 3 days a week ( including spin class)  Dec. 5, 2016:  Was last seen in 2013. Recently saw [name]. 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  Sharp pain . Has not carried any NTG in years.  Still working out regularly - without any angina .   Works out for 2 hours. Still playing golf several days a week.   Dec. 5, 2017:  Doing well. Played 27 holes of golf yesterday. Goes to the gym 3 days a week - spin class.   Jan. 16, 2019:  [name] is seen back today for follow-up of his coronary artery disease and carotid artery disease.  He had a carotid duplex scan in December, 2018 which reveals moderate bilateral carotid disease.  He will be scheduled to repeat this in 1 year.  Fasting labs recently reveal normal electrolytes and a stable creatinine at 1.29.  His liver enzymes are normal.  Total cholesterol is 126.  His HDL is stable at 30.  LDL is 67.  Triglycerides are 146.  Still exercising at [employer] 3 days a week.   Plays golf regularly  No CP or dyspnea.   Jan. 16 , 2020  [name] is seen today for a 1 year office visit Has a hx of CAD  With PCI of his RCA in March, 2012 Has moderate bilateral carotid artery disease  No syncope Has occasional episodes of upper abdominal /  lower chest pain He thinks its indigestion - is relieved with antiacids He exercises on a regular basis and these pains are not brought on by exercise. Lasts for a few seconds  Goes to [employer] regularly   Jan. 20, 2021:  [name] is seen today for follow up of his CAD.   S/p PCI of his RCA in March , 2012. Carotid duplex scan in Dec. 2020 showed moderate R carotid disease and mild L carotid stenosis.  No CP, no dyspnea.  No syncope or presyncope  Jan. 21, 2022:  [name] is seen for follow up of his CAD  Still working out at the [employer] .  No CP , no dyspnea   November 20, 2021: [name] is seen today for follow-up visit.  He has a history of coronary artery disease. His wife of 36 years passed away several months ago . No cp,  no dyspnea,  still working out at the gym   Current Outpatient Medications on File Prior to Visit  Medication Sig Dispense Refill   amLODipine (NORVASC) 5 MG tablet Take 1 tablet (5 mg total) by mouth daily. 90 tablet 3   clopidogrel (PLAVIX) 75 MG tablet Take 1 tablet by mouth once daily 90 tablet 3   cyclobenzaprine (FLEXERIL) 5 MG  tablet Take 1 tablet (5 mg total) by mouth at bedtime. 14 tablet 0   fish oil-omega-3 fatty acids 1000 MG capsule Take 2 g by mouth daily.     glucosamine-chondroitin 500-400 MG tablet Take 1 tablet by mouth daily in the afternoon.     levothyroxine (SYNTHROID) 88 MCG tablet Take 1 tablet (88 mcg total) by mouth daily before breakfast. 90 tablet 3   pravastatin (PRAVACHOL) 40 MG tablet Take 1 tablet (40 mg total) by mouth daily. 90 tablet 3   aspirin 81 MG tablet Take 81 mg by mouth daily. (Patient not taking: Reported on 11/20/2021)     famotidine (PEPCID) 20 MG tablet Take 1 tablet (20 mg total) by mouth 2 (two) times daily. (Patient not taking: Reported on 11/20/2021) 60 tablet 5   No current facility-administered medications on file prior to visit.    No Known Allergies  Past Medical History:  Diagnosis Date   Coronary  artery disease    PCI, STent to RCA   Hyperlipidemia    Rash    Thyroid disease    hypothyroidism    Past Surgical History:  Procedure Laterality Date   APPENDECTOMY     CARDIAC CATHETERIZATION     2007 occluded ramus inter. , PCI to RCA   CARPAL TUNNEL RELEASE      Social History   Tobacco Use  Smoking Status Former   Types: Cigarettes   Quit date: 11/04/1960   Years since quitting: 61.0  Smokeless Tobacco Never    Social History   Substance and Sexual Activity  Alcohol Use No    Family History  Problem Relation Age of Onset   Hypertension Mother    Hypertension Father    Allergic rhinitis Neg Hx    Angioedema Neg Hx    Asthma Neg Hx    Eczema Neg Hx    Immunodeficiency Neg Hx    Urticaria Neg Hx     Reviw of Systems:  Reviewed in the HPI.  All other systems are negative.  Physical Exam: Blood pressure 126/66, pulse (!) 57, height 5\' 9"  (1.753 m), weight 165 lb 6.4 oz (75 kg), SpO2 96 %.  GEN:  Well nourished, well developed in no acute distress HEENT: Normal NECK: No JVD; No carotid bruits LYMPHATICS: No lymphadenopathy CARDIAC: RRR 2/6 systolic murmur  RESPIRATORY:  Clear to auscultation without rales, wheezing or rhonchi  ABDOMEN: Soft, non-tender, non-distended MUSCULOSKELETAL:  No edema; No deformity  SKIN: Warm and dry NEUROLOGIC:  Alert and oriented x 3    ECG: Jan 17th, 2023: Sinus bradycardia at 57.  Right bundle branch block, occasional premature ventricular contractions.   Assessment / Plan:   1. CAD -    no CP ,   doing well ,    2. RBBB -   stable    3. Aortic stenosis:   was mild AS in 2013.  Repeat echo    4. Carotid bruits -   moderate carotid disease by duplex scan    5. Hyperlipidemia-    Lipids are stable,   cont current meds.  Check labs today   2014, MD  11/20/2021 11:31 AM    [facility] [address] [place],  [address] [place], [place]  [place] Pager [phone] Phone: [phone]; Fax: [phone]

## 2021-11-20 NOTE — Patient Instructions (Signed)
Medication Instructions:   Your physician recommends that you continue on your current medications as directed. Please refer to the Current Medication list given to you today.   *If you need a refill on your cardiac medications before your next appointment, please call your pharmacy*   Lab Work:  TODAY--LIPIDS  If you have labs (blood work) drawn today and your tests are completely normal, you will receive your results only by: [place] (if you have MyChart) OR A paper copy in the mail If you have any lab test that is abnormal or we need to change your treatment, we will call you to review the results.   Testing/Procedures:  Your physician has requested that you have an echocardiogram. Echocardiography is a painless test that uses sound waves to create images of your heart. It provides your doctor with information about the size and shape of your heart and how well your hearts chambers and valves are working. This procedure takes approximately one hour. There are no restrictions for this procedure.   Follow-Up: At [facility], you and your health needs are our priority.  As part of our continuing mission to provide you with exceptional heart care, we have created designated Provider Care Teams.  These Care Teams include your primary Cardiologist (physician) and Advanced Practice Providers (APPs -  Physician Assistants and Nurse Practitioners) who all work together to provide you with the care you need, when you need it.  We recommend signing up for the patient portal called "MyChart".  Sign up information is provided on this After Visit Summary.  MyChart is used to connect with patients for Virtual Visits (Telemedicine).  Patients are able to view lab/test results, encounter notes, upcoming appointments, etc.  Non-urgent messages can be sent to your provider as well.   To learn more about what you can do with MyChart, go to [email or web address].    Your next appointment:    1 year(s)  The format for your next appointment:   In Person  Provider:   Mertie Moores, MD OR WITH AN EXTENDER

## 2021-11-27 ENCOUNTER — Ambulatory Visit (INDEPENDENT_AMBULATORY_CARE_PROVIDER_SITE_OTHER): Payer: Medicare Other

## 2021-11-27 DIAGNOSIS — Z0000 Encounter for general adult medical examination without abnormal findings: Secondary | ICD-10-CM

## 2021-11-27 NOTE — Progress Notes (Signed)
Subjective:   [name] is a 88 y.o. male who presents for an Subsequent Medicare Annual Wellness Visit. I connected with [name]  today by telephone and verified that I am speaking with the correct person using two identifiers. Location patient: home Location provider: work Persons participating in the virtual visit: patient, provider.   I discussed the limitations, risks, security and privacy concerns of performing an evaluation and management service by telephone and the availability of in person appointments. I also discussed with the patient that there may be a patient responsible charge related to this service. The patient expressed understanding and verbally consented to this telephonic visit.    Interactive audio and video telecommunications were attempted between this provider and patient, however failed, due to patient having technical difficulties OR patient did not have access to video capability.  We continued and completed visit with audio only.    Review of Systems     Cardiac Risk Factors include: advanced age (>17men, >40 women);dyslipidemia;male gender     Objective:    Today's Vitals   There is no height or weight on file to calculate BMI.  Advanced Directives 11/27/2021 08/01/2020  Does Patient Have a Medical Advance Directive? Yes Yes  Type of Paramedic of [place];Living will [place];Living will  Copy of [place] in Chart? No - copy requested No - copy requested    Current Medications (verified) Outpatient Encounter Medications as of 11/27/2021  Medication Sig   amLODipine (NORVASC) 5 MG tablet Take 1 tablet (5 mg total) by mouth daily.   aspirin 81 MG tablet Take 81 mg by mouth daily.   clopidogrel (PLAVIX) 75 MG tablet Take 1 tablet by mouth once daily   fish oil-omega-3 fatty acids 1000 MG capsule Take 2 g by mouth daily.   glucosamine-chondroitin 500-400 MG tablet Take 1 tablet  by mouth daily in the afternoon.   levothyroxine (SYNTHROID) 88 MCG tablet Take 1 tablet (88 mcg total) by mouth daily before breakfast.   pravastatin (PRAVACHOL) 40 MG tablet Take 1 tablet (40 mg total) by mouth daily.   cyclobenzaprine (FLEXERIL) 5 MG tablet Take 1 tablet (5 mg total) by mouth at bedtime. (Patient not taking: Reported on 11/27/2021)   famotidine (PEPCID) 20 MG tablet Take 1 tablet (20 mg total) by mouth 2 (two) times daily. (Patient not taking: Reported on 11/20/2021)   No facility-administered encounter medications on file as of 11/27/2021.    Allergies (verified) Patient has no known allergies.   History: Past Medical History:  Diagnosis Date   Coronary artery disease    PCI, STent to RCA   Hyperlipidemia    Rash    Thyroid disease    hypothyroidism   Past Surgical History:  Procedure Laterality Date   APPENDECTOMY     CARDIAC CATHETERIZATION     2007 occluded ramus inter. , PCI to RCA   CARPAL TUNNEL RELEASE     Family History  Problem Relation Age of Onset   Hypertension Mother    Hypertension Father    Allergic rhinitis Neg Hx    Angioedema Neg Hx    Asthma Neg Hx    Eczema Neg Hx    Immunodeficiency Neg Hx    Urticaria Neg Hx    Social History   Socioeconomic History   Marital status: Married    Spouse name: Not on file   Number of children: Not on file   Years of education:  Not on file   Highest education level: Not on file  Occupational History   Occupation: reired  Tobacco Use   Smoking status: Former    Types: Cigarettes    Quit date: 11/04/1960    Years since quitting: 61.1   Smokeless tobacco: Never  Vaping Use   Vaping Use: Never used  Substance and Sexual Activity   Alcohol use: No   Drug use: No   Sexual activity: Not on file  Other Topics Concern   Not on file  Social History Narrative   Not on file   Social Determinants of Health   Financial Resource Strain: Low Risk    Difficulty of Paying Living Expenses: Not hard  at all  Food Insecurity: No Food Insecurity   Worried About Charity fundraiser in the Last Year: Never true   [place] in the Last Year: Never true  Transportation Needs: No Transportation Needs   Lack of Transportation (Medical): No   Lack of Transportation (Non-Medical): No  Physical Activity: Sufficiently Active   Days of Exercise per Week: 3 days   Minutes of Exercise per Session: 60 min  Stress: No Stress Concern Present   Feeling of Stress : Not at all  Social Connections: Moderately Integrated   Frequency of Communication with Friends and Family: Three times a week   Frequency of Social Gatherings with Friends and Family: Three times a week   Attends Religious Services: More than 4 times per year   Active Member of Clubs or Organizations: Yes   Attends Archivist Meetings: More than 4 times per year   Marital Status: Widowed    Tobacco Counseling Counseling given: Not Answered   Clinical Intake:  Pre-visit preparation completed: Yes  Pain : No/denies pain     Nutritional Risks: None Diabetes: No  How often do you need to have someone help you when you read instructions, pamphlets, or other written materials from your doctor or pharmacy?: 1 - Never What is the last grade level you completed in school?: High school  Diabetic?no   Interpreter Needed?: No  Information entered by :: L.Argusta Mcgann,LPN   Activities of Daily Living In your present state of health, do you have any difficulty performing the following activities: 11/27/2021  Hearing? N  Vision? N  Difficulty concentrating or making decisions? N  Walking or climbing stairs? N  Dressing or bathing? N  Doing errands, shopping? N  Preparing Food and eating ? N  Using the Toilet? N  In the past six months, have you accidently leaked urine? N  Do you have problems with loss of bowel control? N  Managing your Medications? N  Managing your Finances? N  Housekeeping or managing your  Housekeeping? N  Some recent data might be hidden    Patient Care Team: Nche, Charlene Brooke, NP as PCP - General (Internal Medicine) [name], Wonda Cheng, MD as PCP - Cardiology (Cardiology)  Indicate any recent Medical Services you may have received from other than [facility] providers in the past year (date may be approximate).     Assessment:   This is a routine wellness examination for Ameer.  Hearing/Vision screen Vision Screening - Comments:: Annual  eye exams wears glasses   Dietary issues and exercise activities discussed: Current Exercise Habits: Home exercise routine, Type of exercise: strength training/weights, Time (Minutes): 60, Frequency (Times/Week): 3, Weekly Exercise (Minutes/Week): 180, Intensity: Mild, Exercise limited by: None identified   Goals Addressed  This Visit's Progress    Patient Stated   On track    Maintain current level of activity       Depression Screen PHQ 2/9 Scores 11/27/2021 11/27/2021 10/24/2020 08/01/2020 12/08/2019  PHQ - 2 Score 0 0 1 0 0    Fall Risk Fall Risk  11/27/2021 01/19/2021 10/24/2020 08/01/2020 12/08/2019  Falls in the past year? 0 1 0 0 0  Number falls in past yr: 0 0 - 0 -  Injury with Fall? 0 1 - 0 -  Risk for fall due to : - History of fall(s) - - -  Follow up Falls evaluation completed Falls evaluation completed - Falls prevention discussed -    FALL RISK PREVENTION PERTAINING TO THE HOME:  Any stairs in or around the home? No  If so, are there any without handrails? No  Home free of loose throw rugs in walkways, pet beds, electrical cords, etc? Yes  Adequate lighting in your home to reduce risk of falls? Yes   ASSISTIVE DEVICES UTILIZED TO PREVENT FALLS:  Life alert? No  Use of a cane, walker or w/c? No  Grab bars in the bathroom? Yes  Shower chair or bench in shower? Yes  Elevated toilet seat or a handicapped toilet? No   Cognitive Function:  Normal cognitive status assessed by direct observation by this  Nurse Health Advisor. No abnormalities found.     6CIT Screen 08/01/2020  What Year? 0 points  What month? 0 points  What time? 0 points  Count back from 20 0 points  Months in reverse 2 points  Repeat phrase 4 points  Total Score 6    Immunizations Immunization History  Administered Date(s) Administered   Fluad Quad(high Dose 65+) 08/01/2020, 08/27/2021   Influenza-Unspecified 09/07/2018, 08/05/2019   PFIZER(Purple Top)SARS-COV-2 Vaccination 02/03/2020, 02/28/2020   Pneumococcal Conjugate-13 08/01/2020   Tdap 02/22/2021    TDAP status: Up to date  Flu Vaccine status: Up to date  Pneumococcal vaccine status: Up to date  Covid-19 vaccine status: Completed vaccines  Qualifies for Shingles Vaccine? Yes   Zostavax completed No   Shingrix Completed?: No.    Education has been provided regarding the importance of this vaccine. Patient has been advised to call insurance company to determine out of pocket expense if they have not yet received this vaccine. Advised may also receive vaccine at local pharmacy or Health Dept. Verbalized acceptance and understanding.  Screening Tests Health Maintenance  Topic Date Due   FOOT EXAM  Never done   OPHTHALMOLOGY EXAM  Never done   Zoster Vaccines- Shingrix (1 of 2) Never done   URINE MICROALBUMIN  02/11/2019   COVID-19 Vaccine (3 - Pfizer risk series) 03/27/2020   HEMOGLOBIN A1C  06/04/2021   Pneumonia Vaccine 46+ Years old (2 - PPSV23 if available, else PCV20) 08/01/2021   TETANUS/TDAP  02/23/2031   INFLUENZA VACCINE  Completed   HPV VACCINES  Aged Out    Health Maintenance  Health Maintenance Due  Topic Date Due   FOOT EXAM  Never done   OPHTHALMOLOGY EXAM  Never done   Zoster Vaccines- Shingrix (1 of 2) Never done   URINE MICROALBUMIN  02/11/2019   COVID-19 Vaccine (3 - Pfizer risk series) 03/27/2020   HEMOGLOBIN A1C  06/04/2021   Pneumonia Vaccine 30+ Years old (2 - PPSV23 if available, else PCV20) 08/01/2021     Colorectal cancer screening: No longer required.   Lung Cancer Screening: (Low Dose CT Chest recommended if Age  55-80 years, 30 pack-year currently smoking OR have quit w/in 15years.) does not qualify.   Lung Cancer Screening Referral: n/a  Additional Screening:  Hepatitis C Screening: does not qualify;   Vision Screening: Recommended annual ophthalmology exams for early detection of glaucoma and other disorders of the eye. Is the patient up to date with their annual eye exam?  Yes  Who is the provider or what is the name of the office in which the patient attends annual eye exams? [facility]  If pt is not established with a provider, would they like to be referred to a provider to establish care? No .   Dental Screening: Recommended annual dental exams for proper oral hygiene  Community Resource Referral / Chronic Care Management: CRR required this visit?  No   CCM required this visit?  No      Plan:     I have personally reviewed and noted the following in the patients chart:   Medical and social history Use of alcohol, tobacco or illicit drugs  Current medications and supplements including opioid prescriptions. Patient is not currently taking opioid prescriptions. Functional ability and status Nutritional status Physical activity Advanced directives List of other physicians Hospitalizations, surgeries, and ER visits in previous 12 months Vitals Screenings to include cognitive, depression, and falls Referrals and appointments  In addition, I have reviewed and discussed with patient certain preventive protocols, quality metrics, and best practice recommendations. A written personalized care plan for preventive services as well as general preventive health recommendations were provided to patient.     Randel Pigg, LPN   [id number]   Nurse Notes: none

## 2021-11-27 NOTE — Patient Instructions (Signed)
[name] , Thank you for taking time to come for your Medicare Wellness Visit. I appreciate your ongoing commitment to your health goals. Please review the following plan we discussed and let me know if I can assist you in the future.   Screening recommendations/referrals: Colonoscopy: no longer required  Recommended yearly ophthalmology/optometry visit for glaucoma screening and checkup Recommended yearly dental visit for hygiene and checkup  Vaccinations: Influenza vaccine: completed  Pneumococcal vaccine: completed  Tdap vaccine: 02/22/2021 Shingles vaccine: will check pharmacy     Advanced directives: none   Conditions/risks identified: none   Next appointment: none   Preventive Care 65 Years and Older, Male Preventive care refers to lifestyle choices and visits with your health care provider that can promote health and wellness. What does preventive care include? A yearly physical exam. This is also called an annual well check. Dental exams once or twice a year. Routine eye exams. Ask your health care provider how often you should have your eyes checked. Personal lifestyle choices, including: Daily care of your teeth and gums. Regular physical activity. Eating a healthy diet. Avoiding tobacco and drug use. Limiting alcohol use. Practicing safe sex. Taking low doses of aspirin every day. Taking vitamin and mineral supplements as recommended by your health care provider. What happens during an annual well check? The services and screenings done by your health care provider during your annual well check will depend on your age, overall health, lifestyle risk factors, and family history of disease. Counseling  Your health care provider may ask you questions about your: Alcohol use. Tobacco use. Drug use. Emotional well-being. Home and relationship well-being. Sexual activity. Eating habits. History of falls. Memory and ability to understand (cognition). Work and work  Astronomer. Screening  You may have the following tests or measurements: Height, weight, and BMI. Blood pressure. Lipid and cholesterol levels. These may be checked every 5 years, or more frequently if you are over 56 years old. Skin check. Lung cancer screening. You may have this screening every year starting at age 66 if you have a 30-pack-year history of smoking and currently smoke or have quit within the past 15 years. Fecal occult blood test (FOBT) of the stool. You may have this test every year starting at age 57. Flexible sigmoidoscopy or colonoscopy. You may have a sigmoidoscopy every 5 years or a colonoscopy every 10 years starting at age 45. Prostate cancer screening. Recommendations will vary depending on your family history and other risks. Hepatitis C blood test. Hepatitis B blood test. Sexually transmitted disease (STD) testing. Diabetes screening. This is done by checking your blood sugar (glucose) after you have not eaten for a while (fasting). You may have this done every 1-3 years. Abdominal aortic aneurysm (AAA) screening. You may need this if you are a current or former smoker. Osteoporosis. You may be screened starting at age 26 if you are at high risk. Talk with your health care provider about your test results, treatment options, and if necessary, the need for more tests. Vaccines  Your health care provider may recommend certain vaccines, such as: Influenza vaccine. This is recommended every year. Tetanus, diphtheria, and acellular pertussis (Tdap, Td) vaccine. You may need a Td booster every 10 years. Zoster vaccine. You may need this after age 74. Pneumococcal 13-valent conjugate (PCV13) vaccine. One dose is recommended after age 40. Pneumococcal polysaccharide (PPSV23) vaccine. One dose is recommended after age 48. Talk to your health care provider about which screenings and vaccines you need  and how often you need them. This information is not intended to replace  advice given to you by your health care provider. Make sure you discuss any questions you have with your health care provider. Document Released: 11/17/2015 Document Revised: 07/10/2016 Document Reviewed: 08/22/2015 Elsevier Interactive Patient Education  2017 [place] Prevention in the Home Falls can cause injuries. They can happen to people of all ages. There are many things you can do to make your home safe and to help prevent falls. What can I do on the outside of my home? Regularly fix the edges of walkways and driveways and fix any cracks. Remove anything that might make you trip as you walk through a door, such as a raised step or threshold. Trim any bushes or trees on the path to your home. Use bright outdoor lighting. Clear any walking paths of anything that might make someone trip, such as rocks or tools. Regularly check to see if handrails are loose or broken. Make sure that both sides of any steps have handrails. Any raised decks and porches should have guardrails on the edges. Have any leaves, snow, or ice cleared regularly. Use sand or salt on walking paths during winter. Clean up any spills in your garage right away. This includes oil or grease spills. What can I do in the bathroom? Use night lights. Install grab bars by the toilet and in the tub and shower. Do not use towel bars as grab bars. Use non-skid mats or decals in the tub or shower. If you need to sit down in the shower, use a plastic, non-slip stool. Keep the floor dry. Clean up any water that spills on the floor as soon as it happens. Remove soap buildup in the tub or shower regularly. Attach bath mats securely with double-sided non-slip rug tape. Do not have throw rugs and other things on the floor that can make you trip. What can I do in the bedroom? Use night lights. Make sure that you have a light by your bed that is easy to reach. Do not use any sheets or blankets that are too big for your bed.  They should not hang down onto the floor. Have a firm chair that has side arms. You can use this for support while you get dressed. Do not have throw rugs and other things on the floor that can make you trip. What can I do in the kitchen? Clean up any spills right away. Avoid walking on wet floors. Keep items that you use a lot in easy-to-reach places. If you need to reach something above you, use a strong step stool that has a grab bar. Keep electrical cords out of the way. Do not use floor polish or wax that makes floors slippery. If you must use wax, use non-skid floor wax. Do not have throw rugs and other things on the floor that can make you trip. What can I do with my stairs? Do not leave any items on the stairs. Make sure that there are handrails on both sides of the stairs and use them. Fix handrails that are broken or loose. Make sure that handrails are as long as the stairways. Check any carpeting to make sure that it is firmly attached to the stairs. Fix any carpet that is loose or worn. Avoid having throw rugs at the top or bottom of the stairs. If you do have throw rugs, attach them to the floor with carpet tape. Make sure that you  have a light switch at the top of the stairs and the bottom of the stairs. If you do not have them, ask someone to add them for you. What else can I do to help prevent falls? Wear shoes that: Do not have high heels. Have rubber bottoms. Are comfortable and fit you well. Are closed at the toe. Do not wear sandals. If you use a stepladder: Make sure that it is fully opened. Do not climb a closed stepladder. Make sure that both sides of the stepladder are locked into place. Ask someone to hold it for you, if possible. Clearly mark and make sure that you can see: Any grab bars or handrails. First and last steps. Where the edge of each step is. Use tools that help you move around (mobility aids) if they are needed. These  include: Canes. Walkers. Scooters. Crutches. Turn on the lights when you go into a dark area. Replace any light bulbs as soon as they burn out. Set up your furniture so you have a clear path. Avoid moving your furniture around. If any of your floors are uneven, fix them. If there are any pets around you, be aware of where they are. Review your medicines with your doctor. Some medicines can make you feel dizzy. This can increase your chance of falling. Ask your doctor what other things that you can do to help prevent falls. This information is not intended to replace advice given to you by your health care provider. Make sure you discuss any questions you have with your health care provider. Document Released: 08/17/2009 Document Revised: 03/28/2016 Document Reviewed: 11/25/2014 Elsevier Interactive Patient Education  2017 [employer].

## 2021-12-01 ENCOUNTER — Other Ambulatory Visit: Payer: Self-pay | Admitting: Cardiovascular Disease

## 2021-12-03 ENCOUNTER — Ambulatory Visit (HOSPITAL_COMMUNITY): Payer: Medicare Other | Attending: Internal Medicine

## 2021-12-03 ENCOUNTER — Other Ambulatory Visit: Payer: Self-pay

## 2021-12-03 DIAGNOSIS — I2510 Atherosclerotic heart disease of native coronary artery without angina pectoris: Secondary | ICD-10-CM

## 2021-12-03 DIAGNOSIS — I350 Nonrheumatic aortic (valve) stenosis: Secondary | ICD-10-CM | POA: Diagnosis not present

## 2021-12-03 LAB — ECHOCARDIOGRAM COMPLETE
AR max vel: 1.26 cm2
AV Area VTI: 1.18 cm2
AV Area mean vel: 1.12 cm2
AV Mean grad: 26 mmHg
AV Peak grad: 43.5 mmHg
Ao pk vel: 3.3 m/s
Area-P 1/2: 2.38 cm2
P 1/2 time: 630 msec
S' Lateral: 2.8 cm

## 2021-12-25 ENCOUNTER — Other Ambulatory Visit: Payer: Self-pay | Admitting: Cardiovascular Disease

## 2021-12-25 ENCOUNTER — Other Ambulatory Visit: Payer: Self-pay | Admitting: Nurse Practitioner

## 2021-12-25 ENCOUNTER — Other Ambulatory Visit (HOSPITAL_COMMUNITY): Payer: Self-pay | Admitting: Cardiovascular Disease

## 2021-12-25 DIAGNOSIS — I6523 Occlusion and stenosis of bilateral carotid arteries: Secondary | ICD-10-CM

## 2021-12-25 DIAGNOSIS — E039 Hypothyroidism, unspecified: Secondary | ICD-10-CM

## 2022-02-05 ENCOUNTER — Other Ambulatory Visit: Payer: Self-pay | Admitting: Cardiovascular Disease

## 2022-03-18 ENCOUNTER — Ambulatory Visit: Payer: Medicare Other | Admitting: Nurse Practitioner

## 2022-03-18 ENCOUNTER — Other Ambulatory Visit: Payer: Self-pay | Admitting: Nurse Practitioner

## 2022-03-18 DIAGNOSIS — E039 Hypothyroidism, unspecified: Secondary | ICD-10-CM

## 2022-03-25 NOTE — Telephone Encounter (Signed)
Chart supports Rx Last OV: 09/2021 

## 2022-03-26 ENCOUNTER — Telehealth: Payer: Self-pay | Admitting: Nurse Practitioner

## 2022-03-26 NOTE — Telephone Encounter (Signed)
[facility] called to say they need approval for a different manufacture for the levothyroxine (SYNTHROID) 88 MCG tablet.

## 2022-03-27 NOTE — Telephone Encounter (Signed)
Gave approval for different manufacture for synthroid.

## 2022-04-03 DIAGNOSIS — H5211 Myopia, right eye: Secondary | ICD-10-CM | POA: Diagnosis not present

## 2022-04-03 LAB — HM DIABETES EYE EXAM

## 2022-04-07 IMAGING — DX DG LUMBAR SPINE COMPLETE 4+V
5 series · 5 of 5 positions shown · non-contrast
Comparison: None

CLINICAL DATA: An 89-year-old male presents with acute on chronic
back pain.

EXAM:
LUMBAR SPINE - COMPLETE 4+ VIEW

[lumbar spine ap]
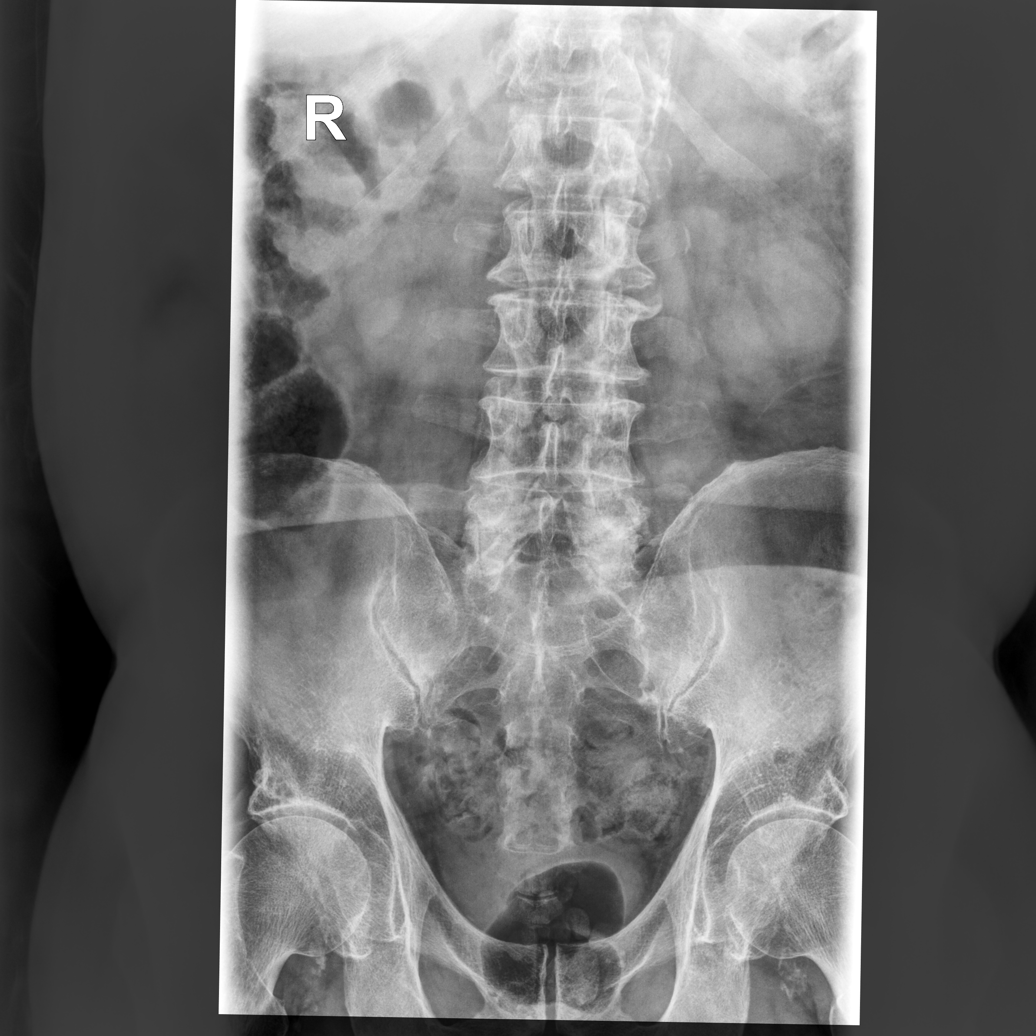

[lumbar spine lmo (1 of 2)]
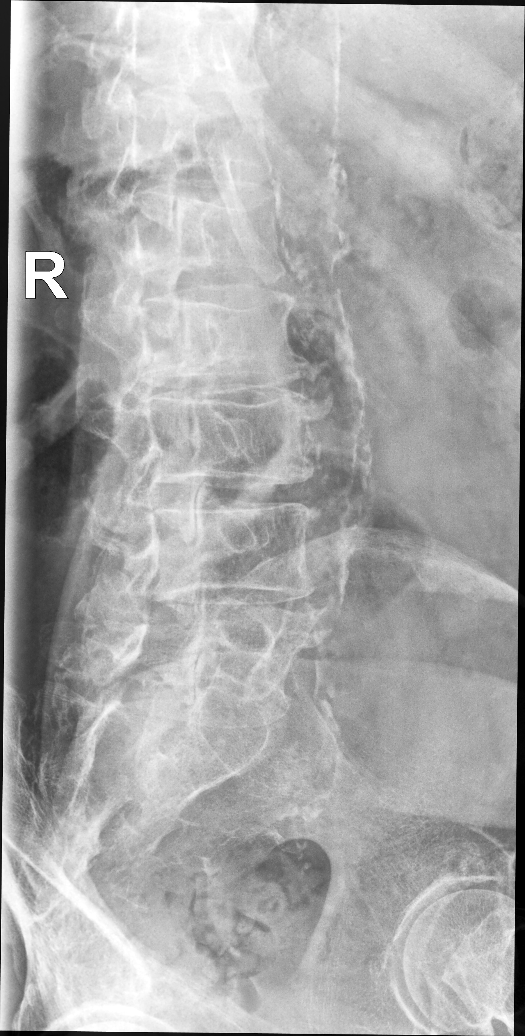

[lumbar spine lmo (2 of 2)]
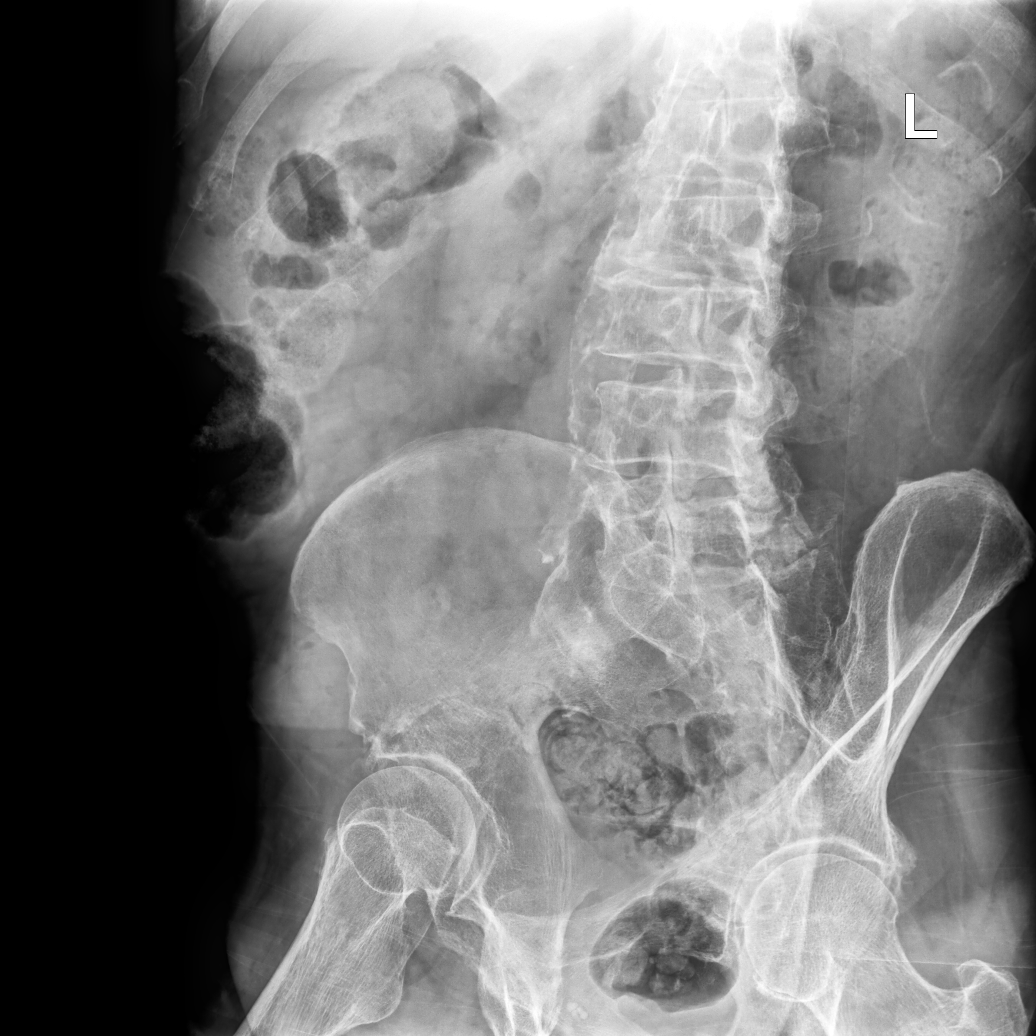

[lumbar spine lat (1 of 2)]
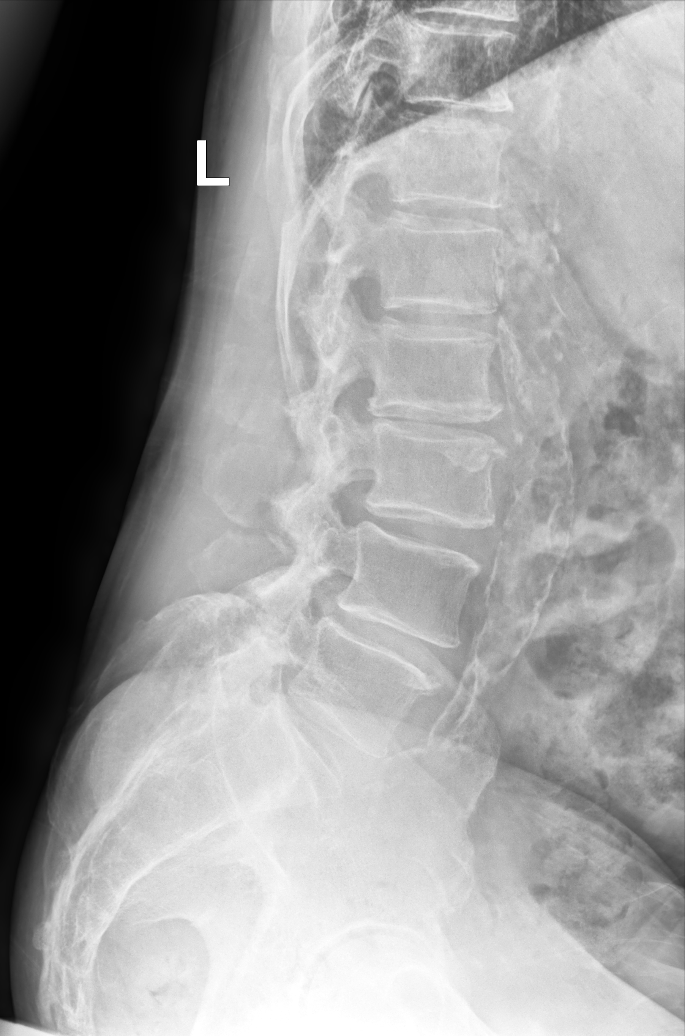

[lumbar spine lat (2 of 2)]
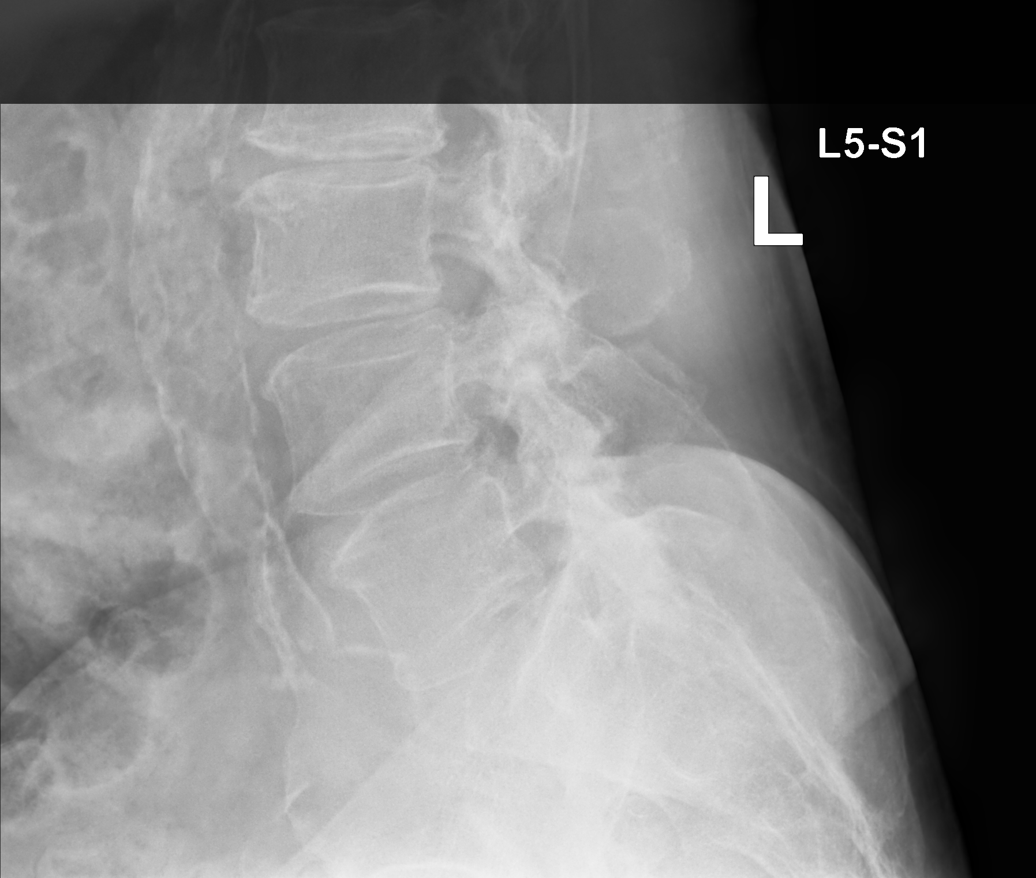

[5 of 5 positions shown; findings below may reference images not displayed]

FINDINGS: Five lumbar type vertebral bodies.

No loss of height.

Disc space narrowing at multiple levels greatest at L2-3 where there
is moderate disc space narrowing associated with osteophytes.

3-4 mm anterolisthesis of L4 on L5 associated with mild disc space
narrowing at L4-5.

Facet arthropathy greatest at L4-5 and L5-S1.

Signs of vascular disease with abundant calcification in the
abdominal aorta.
IMPRESSION: Degenerative changes in the lumbar spine as described.

No signs of acute fracture.

Signs of vascular disease with abundant calcification in the
abdominal aorta.

## 2022-06-18 ENCOUNTER — Other Ambulatory Visit: Payer: Self-pay | Admitting: Nurse Practitioner

## 2022-06-18 DIAGNOSIS — E039 Hypothyroidism, unspecified: Secondary | ICD-10-CM

## 2022-06-18 NOTE — Telephone Encounter (Signed)
Chart supports Rx Last OV: 09/2021 Next OV: Not scheduled

## 2022-07-22 ENCOUNTER — Encounter: Payer: Self-pay | Admitting: Family Medicine

## 2022-07-22 ENCOUNTER — Ambulatory Visit (INDEPENDENT_AMBULATORY_CARE_PROVIDER_SITE_OTHER): Payer: Medicare Other | Admitting: Family Medicine

## 2022-07-22 VITALS — BP 130/82 | HR 70 | Temp 97.5°F | Wt 162.6 lb

## 2022-07-22 DIAGNOSIS — Z23 Encounter for immunization: Secondary | ICD-10-CM | POA: Diagnosis not present

## 2022-07-22 DIAGNOSIS — M5441 Lumbago with sciatica, right side: Secondary | ICD-10-CM

## 2022-07-22 DIAGNOSIS — M5442 Lumbago with sciatica, left side: Secondary | ICD-10-CM | POA: Diagnosis not present

## 2022-07-22 DIAGNOSIS — G8929 Other chronic pain: Secondary | ICD-10-CM

## 2022-07-22 MED ORDER — PREDNISONE 50 MG PO TABS: ORAL_TABLET | ORAL | 0 refills | Status: DC

## 2022-07-22 MED ORDER — METHYLPREDNISOLONE SODIUM SUCC 40 MG IJ SOLR
40.0000 mg | Freq: Once | INTRAMUSCULAR | Status: AC
  Administered 2022-07-22: 40 mg via INTRAMUSCULAR

## 2022-07-22 NOTE — Assessment & Plan Note (Signed)
Chronic low back pain, likely triggered from some event where he bent over at work No bowel or bladder changes or saddle anesthesia Discussed what for further advanced imaging, but patient declined at this time Not a candidate for NSAIDs due to chronic clopidogrel use Solu-Medrol 40 mg x 1 IM Prednisone 50 mg x 5 days Follow-up and return precautions discussed

## 2022-07-22 NOTE — Progress Notes (Signed)
Assessment/Plan:   Problem List Items Addressed This Visit       Nervous and Auditory   Chronic left-sided low back pain with left-sided sciatica    Chronic low back pain, likely triggered from some event where he bent over at work No bowel or bladder changes or saddle anesthesia Discussed what for further advanced imaging, but patient declined at this time Not a candidate for NSAIDs due to chronic clopidogrel use Solu-Medrol 40 mg x 1 IM Prednisone 50 mg x 5 days Follow-up and return precautions discussed      Relevant Medications   predniSONE (DELTASONE) 50 MG tablet   Other Visit Diagnoses     Need for influenza vaccination    -  Primary   Relevant Orders   Flu Vaccine QUAD High Dose(Fluad) (Completed)          Subjective:  HPI:  [name] is a 88 y.o. male who has CAD (coronary artery disease); Hypertension; Hypothyroidism; Hyperlipidemia; DM (diabetes mellitus) ([place]); CKD (chronic kidney disease) stage 3, GFR 30-59 ml/min ([place]); Pruritic rash; Pruritus; and Chronic left-sided low back pain with left-sided sciatica on their problem list..   He  has a past medical history of Coronary artery disease, Hyperlipidemia, Rash, and Thyroid disease.Marland Kitchen   He presents with chief complaint of Back Pain (Lower back pain that shoots down the back of his thighs ) .   Low Back Pain: Patient complains of chronic low back pain. 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 The patient first noted symptoms 3weeks ago. 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 The pain is rated mild, moderate, and is located at the across the lower back or radiating to bilateral leg(s). The pain is described as soreness and spasm and occurs intermittently. The symptoms denies been progressive. Symptoms are exacerbated by extension, standing, straining, and twisting. Factors which relieve the pain include muscle relaxants and narcotic pain medications. Other associated  symptoms include no other symptoms. Previous history of symptoms: the problem is long-standing.  Treatment efforts have included muscle relaxers, home exercises, and tramadol from prior visit, without relief.  Patient x-ray in 09/2021 showed some degenerative changes.  Currently there is no x-ray dictation at this site.  Discussed wanting to get additional imaging and further strength, but due to discomfort, patient declined.   Past Surgical History:  Procedure Laterality Date   APPENDECTOMY     CARDIAC CATHETERIZATION     2007 occluded ramus inter. , PCI to RCA   CARPAL TUNNEL RELEASE      Outpatient Medications Prior to Visit  Medication Sig Dispense Refill   amLODipine (NORVASC) 5 MG tablet Take 1 tablet by mouth once daily 90 tablet 3   clopidogrel (PLAVIX) 75 MG tablet Take 1 tablet by mouth once daily 90 tablet 3   fish oil-omega-3 fatty acids 1000 MG capsule Take 2 g by mouth daily.     glucosamine-chondroitin 500-400 MG tablet Take 1 tablet by mouth daily in the afternoon.     levothyroxine (SYNTHROID) 88 MCG tablet TAKE 1 TABLET BY MOUTH ONCE DAILY BEFORE BREAKFAST 90 tablet 0   pravastatin (PRAVACHOL) 40 MG tablet Take 1 tablet by mouth once daily 90 tablet 3   aspirin 81 MG tablet Take 81 mg by mouth daily. (Patient not taking: Reported on 07/22/2022)     cyclobenzaprine (FLEXERIL) 5 MG tablet Take 1 tablet (5 mg total) by mouth at bedtime. (Patient not taking: Reported  on 11/27/2021) 14 tablet 0   famotidine (PEPCID) 20 MG tablet Take 1 tablet (20 mg total) by mouth 2 (two) times daily. (Patient not taking: Reported on 11/20/2021) 60 tablet 5   No facility-administered medications prior to visit.    Family History  Problem Relation Age of Onset   Hypertension Mother    Hypertension Father    Allergic rhinitis Neg Hx    Angioedema Neg Hx    Asthma Neg Hx    Eczema Neg Hx    Immunodeficiency Neg Hx    Urticaria Neg Hx     Social History   Socioeconomic History    Marital status: Married    Spouse name: Not on file   Number of children: Not on file   Years of education: Not on file   Highest education level: Not on file  Occupational History   Occupation: reired  Tobacco Use   Smoking status: Former    Types: Cigarettes    Quit date: 11/04/1960    Years since quitting: 61.7   Smokeless tobacco: Never  Vaping Use   Vaping Use: Never used  Substance and Sexual Activity   Alcohol use: No   Drug use: No   Sexual activity: Not on file  Other Topics Concern   Not on file  Social History Narrative   Not on file   Social Determinants of Health   Financial Resource Strain: Low Risk  (11/27/2021)   Overall Financial Resource Strain (CARDIA)    Difficulty of Paying Living Expenses: Not hard at all  Food Insecurity: No Food Insecurity (11/27/2021)   Hunger Vital Sign    Worried About Running Out of Food in the Last Year: Never true    [place] in the Last Year: Never true  Transportation Needs: No Transportation Needs (11/27/2021)   PRAPARE - Hydrologist (Medical): No    Lack of Transportation (Non-Medical): No  Physical Activity: Sufficiently Active (11/27/2021)   Exercise Vital Sign    Days of Exercise per Week: 3 days    Minutes of Exercise per Session: 60 min  Stress: No Stress Concern Present (11/27/2021)   [place]    Feeling of Stress : Not at all  Social Connections: Moderately Integrated (11/27/2021)   Social Connection and Isolation Panel [NHANES]    Frequency of Communication with Friends and Family: Three times a week    Frequency of Social Gatherings with Friends and Family: Three times a week    Attends Religious Services: More than 4 times per year    Active Member of Clubs or Organizations: Yes    Attends Archivist Meetings: More than 4 times per year    Marital Status: Widowed  Intimate Partner Violence: Not At  Risk (11/27/2021)   Humiliation, Afraid, Rape, and Kick questionnaire    Fear of Current or Ex-Partner: No    Emotionally Abused: No    Physically Abused: No    Sexually Abused: No  Objective:  Physical Exam: BP 130/82 (BP Location: Left Arm, Patient Position: Sitting, Cuff Size: Large)   Pulse 70   Temp (!) 97.5 F (36.4 C) (Temporal)   Wt 162 lb 9.6 oz (73.8 kg)   SpO2 99%   BMI 24.01 kg/m    General: No acute distress. Awake and conversant.  Eyes: Normal conjunctiva, anicteric. Round symmetric pupils.  ENT: Hearing grossly intact. No nasal discharge.  Neck: Neck is supple. No masses or thyromegaly.  Respiratory: Respirations are non-labored. No auditory wheezing.  Skin: Warm. No rashes or ulcers.  Psych: Alert and oriented. Cooperative, Appropriate mood and affect, Normal judgment.  CV: No cyanosis or JVD MSK: Difficulty when rising, mildly shuffling gait, pain improved when seated, negative leg raise bilaterally, midline lower back mildly tender to palpation Neuro: Sensation and CN II-XII grossly normal.        Alesia Banda, MD, MS

## 2022-07-22 NOTE — Patient Instructions (Addendum)
Take prednisone with food in the morning Go ED, severe, get fevers, or weakness, or bowel or bladder incontinence

## 2022-07-24 ENCOUNTER — Ambulatory Visit: Payer: Medicare Other | Admitting: Family Medicine

## 2022-07-24 ENCOUNTER — Telehealth: Payer: Self-pay | Admitting: Nurse Practitioner

## 2022-07-24 NOTE — Telephone Encounter (Signed)
Pt called to say his back is feeling better and wants to wait for an appt if it worsens.

## 2022-07-30 ENCOUNTER — Encounter: Payer: Self-pay | Admitting: Family Medicine

## 2022-07-30 ENCOUNTER — Ambulatory Visit (INDEPENDENT_AMBULATORY_CARE_PROVIDER_SITE_OTHER): Payer: Medicare Other | Admitting: Family Medicine

## 2022-07-30 ENCOUNTER — Ambulatory Visit (INDEPENDENT_AMBULATORY_CARE_PROVIDER_SITE_OTHER): Payer: Medicare Other

## 2022-07-30 VITALS — BP 116/58 | HR 56 | Temp 97.3°F | Wt 160.4 lb

## 2022-07-30 DIAGNOSIS — M47816 Spondylosis without myelopathy or radiculopathy, lumbar region: Secondary | ICD-10-CM | POA: Diagnosis not present

## 2022-07-30 DIAGNOSIS — M5442 Lumbago with sciatica, left side: Secondary | ICD-10-CM | POA: Diagnosis not present

## 2022-07-30 DIAGNOSIS — M5441 Lumbago with sciatica, right side: Secondary | ICD-10-CM

## 2022-07-30 DIAGNOSIS — M545 Low back pain, unspecified: Secondary | ICD-10-CM | POA: Diagnosis not present

## 2022-07-30 DIAGNOSIS — M5136 Other intervertebral disc degeneration, lumbar region: Secondary | ICD-10-CM | POA: Diagnosis not present

## 2022-07-30 NOTE — Progress Notes (Signed)
[facility] [address] [place] [address] Dept: [phone] Dept Fax: [phone]  Office Visit  Subjective:    Patient ID: [name], male    DOB: Mar 30, 1930, 88 y.o..   MRN: [id number]  Chief Complaint  Patient presents with   Acute Visit    3 wks back pain,Sciatic nerve pain.Front and back of legs. Spasm.Pain Rate 9 when walking.Tylenol for pain.    History of Present Illness:  Patient is in today with ongoing issues with low back pain. [name] was seen by Dr. Janee Morn last week. He has had some chronic low back pain that flares periodically. 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 He feels the bending and lifting from work may have exacerbated this. Dr. Janee Morn had given him a course of prednisone. This appears to have helped some initially. However, this morning, [name] notes he was having difficulty getting out of bed due to pain. He lay back and adjusted his position. He later was able to get out with a lower degree of pain. 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 He has some pain radiating down both buttocks. He denies any numbness, tingling, or weakness in his legs, though he admits that at times it feels like his legs may give out.  Past Medical History: Patient Active Problem List   Diagnosis Date Noted   Chronic left-sided low back pain with left-sided sciatica 09/07/2021   Pruritic rash 12/13/2020   Pruritus 12/13/2020   CKD (chronic kidney disease) stage 3, GFR 30-59 ml/min ([facility]) 12/24/2019   DM (diabetes mellitus) ([facility]) 12/09/2019   Hyperlipidemia 10/09/2015   Hypothyroidism    CAD (coronary artery disease) 02/12/2011   Hypertension 02/12/2011   Past Surgical History:  Procedure Laterality Date   APPENDECTOMY      CARDIAC CATHETERIZATION     2007 occluded ramus inter. , PCI to RCA   CARPAL TUNNEL RELEASE     Family History  Problem Relation Age of Onset   Hypertension Mother    Hypertension Father    Allergic rhinitis Neg Hx    Angioedema Neg Hx    Asthma Neg Hx    Eczema Neg Hx    Immunodeficiency Neg Hx    Urticaria Neg Hx    Outpatient Medications Prior to Visit  Medication Sig Dispense Refill   fish oil-omega-3 fatty acids 1000 MG capsule Take 2 g by mouth daily.     glucosamine-chondroitin 500-400 MG tablet Take 1 tablet by mouth daily in the afternoon.     levothyroxine (SYNTHROID) 88 MCG tablet TAKE 1 TABLET BY MOUTH ONCE DAILY BEFORE BREAKFAST 90 tablet 0   pravastatin (PRAVACHOL) 40 MG tablet Take 1 tablet by mouth once daily 90 tablet 3   amLODipine (NORVASC) 5 MG tablet Take 1 tablet by mouth once daily 90 tablet 3   aspirin 81 MG tablet Take 81 mg by mouth daily. (Patient not taking: Reported on 07/22/2022)     clopidogrel (PLAVIX) 75 MG tablet Take 1 tablet by mouth once daily 90 tablet 3   cyclobenzaprine (FLEXERIL) 5 MG tablet Take 1 tablet (5 mg total) by mouth at bedtime. (Patient not taking: Reported on 11/27/2021) 14 tablet 0   famotidine (PEPCID) 20 MG tablet Take 1 tablet (20 mg total) by mouth 2 (  two) times daily. (Patient not taking: Reported on 11/20/2021) 60 tablet 5   predniSONE (DELTASONE) 50 MG tablet Take 1 tablet daily for 5 days. (Patient not taking: Reported on 07/30/2022) 5 tablet 0   No facility-administered medications prior to visit.   No Known Allergies    Objective:   Today's Vitals   07/30/22 0859  BP: (!) 116/58  Pulse: (!) 56  Temp: (!) 97.3 F (36.3 C)  TempSrc: Temporal  SpO2: 96%  Weight: 160 lb 6.4 oz (72.8 kg)   Body mass index is 23.69 kg/m.   General: Well developed, well nourished. No acute distress. Back: Straight. Mild tenderness along the paraspinal muscle columns in the lower lumbar area. Extremities: Full ROM. - SLR. Strength  5/5. Sensation normal. DTR 1+ bilat. Psych: Alert and oriented. Normal mood and affect.  Health Maintenance Due  Topic Date Due   FOOT EXAM  Never done   OPHTHALMOLOGY EXAM  Never done   Zoster Vaccines- Shingrix (1 of 2) Never done   HEMOGLOBIN A1C  06/04/2021   Pneumonia Vaccine 3+ Years old (2 - PPSV23 or PCV20) 08/01/2021   Imaging: Lumbar x-ray- Diffuse osteophytes and facet arthrosis with some spondylolisthesis  at the L5-S1 junction.  Assessment & Plan:   1. Acute bilateral low back pain with bilateral sciatica I am concerned that there may be some spinal stenosis present producing the pain. I recommend we have him seen by orthopedics for further assessment. In the meantime, I will have him use heat and stretches for the lower back. He should use Tylenol 500 mg 1-2 four times a day for pain.  - DG Lumbar Spine Complete - Ambulatory referral to Orthopedic Surgery  Return in about 3 months (around 10/29/2022) for Reassessment.   Haydee Salter, MD

## 2022-07-30 NOTE — Progress Notes (Unsigned)
Initial visit for chronic LBP with bilateral sciatica. No recent injury.

## 2022-08-01 ENCOUNTER — Ambulatory Visit (INDEPENDENT_AMBULATORY_CARE_PROVIDER_SITE_OTHER): Payer: Medicare Other | Admitting: Orthopedic Surgery

## 2022-08-01 ENCOUNTER — Encounter: Payer: Self-pay | Admitting: Orthopedic Surgery

## 2022-08-01 ENCOUNTER — Ambulatory Visit: Payer: Self-pay

## 2022-08-01 VITALS — BP 161/90 | HR 65 | Ht 69.0 in | Wt 160.5 lb

## 2022-08-01 DIAGNOSIS — M48062 Spinal stenosis, lumbar region with neurogenic claudication: Secondary | ICD-10-CM | POA: Diagnosis not present

## 2022-08-01 NOTE — Progress Notes (Signed)
Orthopedic Spine Surgery Office Note  Patient name: [name] Patient MRN: [id number] Date of visit: 08/01/22  History:  Patient is a 88 year old, nice man who presents today for his lumbar spine Pain location: bilateral thighs, almost no back pain Severity of pain: moderate, has been able to keep doing is activities but has to be careful or he'll aggravate the pain Mechanism of injury: insidious, no trauma Radicular symptoms: no Weakness: denies Duration of symptoms: about 3-4 weeks Symptoms of imbalance: denies Paresthesias and numbness: denies Bowel or bladder incontinence: denies Saddle anesthesia: denies Patient can walk a few minutes before symptoms start Symptoms:  -leg cramping: yes - this is his main issue, it feels like his legs are cramping and he has pain  -leg heaviness: yes  -with improvement upon flexion to back: he has not noticed if this helps  -leg pain: yes Does not that pain is felt with walking and improves when he sits  Treatments tried: prednisone, tylenol, heat pack  Review of systems: Denies fevers and chills, night sweats, unexplained weight loss, history of cancer, pain that wakes them at night  Past medical history: CAD HTN Hypothyroidism DM CKD HLD  Allergies: NKDA  Past surgical history:  Appendectomy [facility]  Social history: Denies use of nicotine product (smoking, vaping, patches, smokeless) Denies recreational drug use Still works a the [employer], helping with the condiments Goes to the gym regularly   Physical Exam:  General: no acute distress, appears stated age Neurologic: alert, answering questions appropriately, following commands Respiratory: unlabored breathing on room air, symmetric chest rise Psychiatric: appropriate affect, normal cadence to speech Vascular: palpable dorsalis pedis pulses bilaterally, feet warm and well perfused   MSK (spine):  -Strength exam      Left  Right EHL     4/5  5/5 TA    5/5  5/5 GSC    5/5  5/5 Knee extension  5/5  5/5 Hip flexion   5/5  5/5  -Sensory exam    Sensation intact to light touch in L3-S1 nerve distributions of bilateral lower extremities  -Achilles DTR: 1/4 on the left, 1/4 on the right -Patellar tendon DTR: 1/4 on the left, 1/4 on the right  Left hip exam: no pain through range of motion, negative stinchfield Right hip exam: no pain through range of motion, negative stinchfield  -Straight leg raise: negative -Contralateral straight leg raise: negative -Clonus: negative  Imaging: XR of lumbar spine from 08/01/22 and 07/30/22 was independently reviewed and interpreted, showing disc height loss at L2/3 and osteophyte formation, stable grade 1 anterolisthesis at L5/S1, multilevel facet arthropathy. 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 Discussed that the conservative treatments include:  -activity modification  -physical therapy  -over the counter pain medications  -medrol dose pack -He has not tried any physical therapy, so a referral was provided to him -If conservative treatment does not provide relief, discussed that surgery may be an option but we would need an MRI to confirm the diagnosis before we entertain that as an option -Patient should return to office in 6 weeks, repeat x-rays of lumbar spine at next visit: none. If he is no better or worse at that visit, will consider getting a lumbar spine MRI  Ileene Rubens, MD  Orthopedic Surgeon

## 2022-08-06 ENCOUNTER — Telehealth: Payer: Self-pay

## 2022-08-06 NOTE — Telephone Encounter (Signed)
Patient called stating that the pain has gotten worse and that he can hardly walk due to the pain that he is having.  Would like a CB to discuss.  Cb# [phone].  Please advise.  Thank you.

## 2022-08-07 MED ORDER — METHOCARBAMOL 500 MG PO TABS: 500.0000 mg | ORAL_TABLET | Freq: Three times a day (TID) | ORAL | 0 refills | Status: DC | PRN

## 2022-08-07 NOTE — Telephone Encounter (Signed)
Orthopedic Telephone Encounter  Returned Bob's call this morning. He is having significant leg pain. He describes it as a cramping pain. He has not started any new medications within the last 6 months. Pain is worse with walking and improves with rest or sitting. He is due for his first physical therapy appt tomorrow. He has been using tylenol. He is on plavix and has been instructed not to use NSAIDs. I instructed him to keep taking the tylenol. He can take 1000mg  TID. He was interested in a muscle relaxer as he has taken these in the past and they have helped.  Robaxin was prescribed to try to give him some relief. A muscle relaxer is not the long-term solution to his problem and if this pain continues in spite of physical therapy, I explained that we should get an MRI to evaluate for spinal stenosis since his pain sounds to be neurogenic claudication. Patient expressed understanding of the plan and all his questions were answered to his satisfaction.   Callie Fielding, MD Orthopedic Spine Surgeon

## 2022-08-07 NOTE — Addendum Note (Signed)
Addended by: Ileene Rubens on: 08/07/2022 08:36 AM   Modules accepted: Orders

## 2022-08-08 ENCOUNTER — Ambulatory Visit: Payer: Medicare Other | Admitting: Rehabilitative and Restorative Service Providers"

## 2022-08-08 ENCOUNTER — Other Ambulatory Visit: Payer: Self-pay

## 2022-08-08 ENCOUNTER — Encounter: Payer: Self-pay | Admitting: Rehabilitative and Restorative Service Providers"

## 2022-08-08 DIAGNOSIS — R262 Difficulty in walking, not elsewhere classified: Secondary | ICD-10-CM

## 2022-08-08 DIAGNOSIS — M6281 Muscle weakness (generalized): Secondary | ICD-10-CM

## 2022-08-08 DIAGNOSIS — M79604 Pain in right leg: Secondary | ICD-10-CM | POA: Diagnosis not present

## 2022-08-08 DIAGNOSIS — M79605 Pain in left leg: Secondary | ICD-10-CM | POA: Diagnosis not present

## 2022-08-08 DIAGNOSIS — M5459 Other low back pain: Secondary | ICD-10-CM

## 2022-08-08 NOTE — Therapy (Signed)
OUTPATIENT PHYSICAL THERAPY EVALUATION   Patient Name: Benino Korinek MRN: [id number] DOB:10-Sep-1930, 88 y.o., male Today's Date: 08/08/2022  END OF SESSION:    PT End of Session - 08/08/22 1009     Visit Number 1    Number of Visits 20    Date for PT Re-Evaluation 10/17/22    Authorization Type BCBS $10 copay    Progress Note Due on Visit 10    PT Start Time 1020    PT Stop Time 1059    PT Time Calculation (min) 39 min    Activity Tolerance Patient tolerated treatment well    Behavior During Therapy WFL for tasks assessed/performed             Past Medical History:  Diagnosis Date   Coronary artery disease    PCI, STent to RCA   Hyperlipidemia    Rash    Thyroid disease    hypothyroidism   Past Surgical History:  Procedure Laterality Date   APPENDECTOMY     CARDIAC CATHETERIZATION     2007 occluded ramus inter. , PCI to RCA   CARPAL TUNNEL RELEASE     Patient Active Problem List   Diagnosis Date Noted   Chronic left-sided low back pain with left-sided sciatica 09/07/2021   Pruritic rash 12/13/2020   Pruritus 12/13/2020   CKD (chronic kidney disease) stage 3, GFR 30-59 ml/min (HCC) 12/24/2019   DM (diabetes mellitus) (HCC) 12/09/2019   Hyperlipidemia 10/09/2015   Hypothyroidism    CAD (coronary artery disease) 02/12/2011   Hypertension 02/12/2011    PCP: Anne Ng, NP  REFERRING PROVIDER: London Sheer, MD  REFERRING DIAG: [id number] (ICD-10-CM) - Spinal stenosis of lumbar region with neurogenic claudication  Rationale for Evaluation and Treatment Rehabilitation  THERAPY DIAG:  Other low back pain  Pain in left leg  Pain in right leg  Muscle weakness (generalized)  Difficulty in walking, not elsewhere classified  ONSET DATE: 07/05/2022  SUBJECTIVE:                                                                                                                                                                                            SUBJECTIVE STATEMENT: Pt indicated 3-4 weeks ago.  Pt indicated he had worked all year at [employer].  Pt indicated timing of games ended about the onset of symptoms.  Pt indicated job required bending and lifting moderate 15-20 lbs with walking required.  Pt indicated some trouble with pain at times but not the major limitation.  Started muscle relaxer in last few days and thought it helped.   Did have injection  and prednisone that helped.  Denied numbness/tingling.   PERTINENT HISTORY:  CAD HTN Hypothyroidism DM CKD hyperlipidemia  PAIN:  NPRS scale: at current sitting 2/10, at worst 10/10 Pain location: back pain /leg pain front and back thigh Pain description: Sharp, some ache  Aggravating factors: walking, getting up out of chair, changing position movements Relieving factors: muscle relaxer  PRECAUTIONS: None  WEIGHT BEARING RESTRICTIONS No  FALLS:  Has patient fallen in last 6 months? Yes - no injury requiring medical visit  LIVING ENVIRONMENT: Lives in: House/apartment Stairs: stairs to enter 2, no rails  OCCUPATION: Work at [employer]: Independent, gym several times at week, spin class.  Hand work making items (standing, bending, twisting).  House/yard work (Engineer, building services)  PATIENT GOALS:   Reduce pain   OBJECTIVE:   PATIENT SURVEYS:  08/08/2022 FOTO intake:  43  predicted:  62  SCREENING FOR RED FLAGS: 08/08/2022 Bowel or bladder incontinence: No Cauda equina syndrome: No  COGNITION: 08/08/2022  Overall cognitive status: WFL normal    SENSATION: 08/08/2022 WFL  POSTURE:  08/08/2022 mild forward trunk lean, reduced lumbar lordosis, limited hip extension bilateral  PALPATION: 08/08/2022 No specific tenderness noted in back today upon inspection  LUMBAR ROM:   Active  AROM  08/08/2022  Flexion Movement to ankles without pain complaints change  Extension To neutral c tightness in front of hips and back  X 5 in standing - improved to  25% WFL c no centralization/peripheralization  Right lateral flexion   Left lateral flexion   Right rotation   Left rotation   No indication of centralization or peripheralization c active movement noted in clinic today.  Hypomobility in movement noted.    (Blank rows = not tested)  LOWER EXTREMITY ROM:      Right 08/08/2022 Left 08/08/2022  Hip flexion    Hip extension    Hip abduction    Hip adduction    Hip internal rotation    Hip external rotation    Knee flexion    Knee extension    Ankle dorsiflexion    Ankle plantarflexion    Ankle inversion    Ankle eversion     (Blank rows = not tested)  LOWER EXTREMITY MMT:    MMT Right 08/08/2022 Left 08/08/2022  Hip flexion 5/5 5/5  Hip extension    Hip abduction    Hip adduction    Hip internal rotation    Hip external rotation    Knee flexion 5/5 5/5  Knee extension 4+/5 4+5/5  Ankle dorsiflexion 5/5 5/5  Ankle plantarflexion    Ankle inversion    Ankle eversion     (Blank rows = not tested)  LUMBAR SPECIAL TESTS:  08/08/2022 (-) slump bilateral, + thomas test bilateral for hip flexor tightness  FUNCTIONAL TESTS:  08/08/2022 18 inch chair transfer s UE assist on several tries Lt and Rt SLS < 3 seconds c min A to prevent loss of balance  GAIT: 08/08/2022 Independent ambulation c mild forward trunk lean, reduced hip extension in swing phases bilateral, forward head posture.  Reduce gait speed initially after standing from chair.     TODAY'S TREATMENT  08/08/2022 Therex:    HEP instruction/performance c cues for techniques, handout provided.  Trial set performed of each for comprehension and symptom assessment.  See below for exercise list    PATIENT EDUCATION:  08/08/2022 Education details: HEP, POC Person educated: Patient Education method: Explanation, Demonstration, Verbal cues, and Handouts Education comprehension:  verbalized understanding, returned demonstration, and verbal cues required    HOME  EXERCISE PROGRAM: Access Code: [email or web address] URL: [email or web address] Date: 08/08/2022 Prepared by: Chyrel Masson  Exercises - Supine Lower Trunk Rotation  - 2-3 x daily - 7 x weekly - 1 sets - 3-5 reps - 15 hold - Hooklying Single Knee to Chest Stretch  - 2-3 x daily - 7 x weekly - 1 sets - 5 reps - 15 hold - Supine Bridge  - 1-2 x daily - 7 x weekly - 1-2 sets - 10 reps - 2 hold - Standing Lumbar Extension with Counter  - 2-3 x daily - 7 x weekly - 1 sets - 5-10 reps - Gastroc Stretch on Wall  - 2-3 x daily - 7 x weekly - 1 sets - 5-10 reps - 1-2 hold  ASSESSMENT:  CLINICAL IMPRESSION: Patient is a 88 y.o. who comes to clinic with complaints of back pain, LE pain with mobility, strength and movement coordination deficits that impair their ability to perform usual daily and recreational functional activities without increase difficulty/symptoms at this time.  Patient to benefit from skilled PT services to address impairments and limitations to improve to previous level of function without restriction secondary to condition.    OBJECTIVE IMPAIRMENTS Abnormal gait, decreased activity tolerance, decreased balance, decreased coordination, decreased endurance, decreased mobility, difficulty walking, decreased ROM, decreased strength, hypomobility, increased fascial restrictions, impaired perceived functional ability, impaired flexibility, improper body mechanics, postural dysfunction, and pain.   ACTIVITY LIMITATIONS carrying, lifting, bending, sitting, standing, squatting, sleeping, stairs, transfers, bed mobility, dressing, and locomotion level  PARTICIPATION LIMITATIONS: meal prep, cleaning, laundry, interpersonal relationship, community activity, occupation, and yard work  PERSONAL FACTORS CAD HTN Hypothyroidism DM CKD hyperlipidemia  are also affecting patient's functional outcome.   REHAB POTENTIAL: Fair to good  CLINICAL DECISION MAKING: Stable/uncomplicated  EVALUATION  COMPLEXITY: Low   GOALS: Goals reviewed with patient? Yes  Short term PT Goals (target date for Short term goals are 3 weeks 08/29/2022) Patient will demonstrate independent use of home exercise program to maintain progress from in clinic treatments. Goal status: New   Long term PT goals (target dates for all long term goals are 10 weeks  10/17/2022 )   1. Patient will demonstrate/report pain at worst less than or equal to 2/10 to facilitate minimal limitation in daily activity secondary to pain symptoms. Goal status: New   2. Patient will demonstrate independent use of home exercise program to facilitate ability to maintain/progress functional gains from skilled physical therapy services. Goal status: New   3. Patient will demonstrate FOTO outcome > or = 62 % to indicate reduced disability due to condition. Goal status: New   4. Patient will demonstrate lumbar extension > or = 50 % WFL s symptoms to facilitate upright standing, walking posture at PLOF s limitation. Goal status: New   5.  Patient will demonstrate sit to stand from 18 inch chair s UE assist on 1st try.    Goal status: New   6.  Patient will demonstrate/report ability to sleep s restriction due to pain.   Goal status: New   7.  Patient will demonstrate ability to return to walking, golf at [facility].  a.  Goal Status: New    PLAN: PT FREQUENCY: 1-2x/week  PT DURATION: 10 weeks  PLANNED INTERVENTIONS: Therapeutic exercises, Therapeutic activity, Neuro Muscular re-education, Balance training, Gait training, Patient/Family education, Joint mobilization, Stair training, DME instructions, Dry Needling, Electrical stimulation, Cryotherapy, Moist heat, Taping, Traction Ultrasound,  Ionotophoresis 4mg /ml Dexamethasone, and Manual therapy.  All included unless contraindicated   PLAN FOR NEXT SESSION: Review HEP knowledge/results.  General mobility improvements.  Possible LE strengthening for HEP.   , PT,  DPT, OCS, ATC 08/08/22  11:20 AM

## 2022-08-12 ENCOUNTER — Encounter: Payer: Self-pay | Admitting: Physical Therapy

## 2022-08-12 ENCOUNTER — Ambulatory Visit: Payer: Medicare Other | Admitting: Physical Therapy

## 2022-08-12 DIAGNOSIS — M5459 Other low back pain: Secondary | ICD-10-CM

## 2022-08-12 DIAGNOSIS — M79605 Pain in left leg: Secondary | ICD-10-CM | POA: Diagnosis not present

## 2022-08-12 DIAGNOSIS — M79604 Pain in right leg: Secondary | ICD-10-CM

## 2022-08-12 DIAGNOSIS — R262 Difficulty in walking, not elsewhere classified: Secondary | ICD-10-CM

## 2022-08-12 DIAGNOSIS — M6281 Muscle weakness (generalized): Secondary | ICD-10-CM

## 2022-08-12 NOTE — Therapy (Signed)
OUTPATIENT PHYSICAL THERAPY TREATMENT NOTE   Patient Name: [name] MRN: [id number] DOB:Oct 24, 1930, 88 y.o., male Today's Date: 08/12/2022  PCP: Flossie Buffy, NP REFERRING PROVIDER: Callie Fielding, MD  END OF SESSION:   PT End of Session - 08/12/22 1110     Visit Number 2    Number of Visits 20    Date for PT Re-Evaluation 10/17/22    Authorization Type BCBS $10 copay    Progress Note Due on Visit 10    PT Start Time 4970    PT Stop Time 1103    PT Time Calculation (min) 48 min    Activity Tolerance Patient tolerated treatment well    Behavior During Therapy WFL for tasks assessed/performed             Past Medical History:  Diagnosis Date   Coronary artery disease    PCI, STent to RCA   Hyperlipidemia    Rash    Thyroid disease    hypothyroidism   Past Surgical History:  Procedure Laterality Date   APPENDECTOMY     CARDIAC CATHETERIZATION     2007 occluded ramus inter. , PCI to RCA   [place]     Patient Active Problem List   Diagnosis Date Noted   Chronic left-sided low back pain with left-sided sciatica 09/07/2021   Pruritic rash 12/13/2020   Pruritus 12/13/2020   CKD (chronic kidney disease) stage 3, GFR 30-59 ml/min ([place]) 12/24/2019   DM (diabetes mellitus) ([place]) 12/09/2019   Hyperlipidemia 10/09/2015   Hypothyroidism    CAD (coronary artery disease) 02/12/2011   Hypertension 02/12/2011    REFERRING DIAG: Y63.785 (ICD-10-CM) - Spinal stenosis of lumbar region with neurogenic claudication  THERAPY DIAG:  Other low back pain  Pain in left leg  Pain in right leg  Muscle weakness (generalized)  Difficulty in walking, not elsewhere classified  Rationale for Evaluation and Treatment Rehabilitation  PERTINENT HISTORY: CAD HTN Hypothyroidism DM CKD hyperlipidemia  PRECAUTIONS: none  SUBJECTIVE: Pt arriving today reporting 1-2/10 pain in his low back with radiation down legs.Pt reporting upper leg spasms when  getting up after sitting.   PAIN:  Are you having pain? Yes: NPRS scale: 2/10 Pain location: low back Pain description: achy Aggravating factors: after sitting for a while Relieving factors: over the counter pain meds   OBJECTIVE: (objective measures completed at initial evaluation unless otherwise dated)  PATIENT SURVEYS:  08/08/2022 FOTO intake:  43  predicted:  62   SCREENING FOR RED FLAGS: 08/08/2022 Bowel or bladder incontinence: No Cauda equina syndrome: No   COGNITION: 08/08/2022            Overall cognitive status: WFL normal              SENSATION: 08/08/2022 WFL   POSTURE:  08/08/2022 mild forward trunk lean, reduced lumbar lordosis, limited hip extension bilateral   PALPATION: 08/08/2022 No specific tenderness noted in back today upon inspection   LUMBAR ROM:    Active  AROM  08/08/2022  Flexion Movement to ankles without pain complaints change  Extension To neutral c tightness in front of hips and back   X 5 in standing - improved to 25% WFL c no centralization/peripheralization  Right lateral flexion    Left lateral flexion    Right rotation    Left rotation    No indication of centralization or peripheralization c active movement noted in clinic today.  Hypomobility in movement noted.     (  Blank rows = not tested)   LOWER EXTREMITY ROM:        Right 08/08/2022 Left 08/08/2022  Hip flexion      Hip extension      Hip abduction      Hip adduction      Hip internal rotation      Hip external rotation      Knee flexion      Knee extension      Ankle dorsiflexion      Ankle plantarflexion      Ankle inversion      Ankle eversion       (Blank rows = not tested)   LOWER EXTREMITY MMT:     MMT Right 08/08/2022 Left 08/08/2022  Hip flexion 5/5 5/5  Hip extension      Hip abduction      Hip adduction      Hip internal rotation      Hip external rotation      Knee flexion 5/5 5/5  Knee extension 4+/5 4+5/5  Ankle dorsiflexion 5/5 5/5  Ankle  plantarflexion      Ankle inversion      Ankle eversion       (Blank rows = not tested)   LUMBAR SPECIAL TESTS:  08/08/2022 (-) slump bilateral, + thomas test bilateral for hip flexor tightness   FUNCTIONAL TESTS:  08/08/2022 18 inch chair transfer s UE assist on several tries Lt and Rt SLS < 3 seconds c min A to prevent loss of balance   GAIT: 08/08/2022 Independent ambulation c mild forward trunk lean, reduced hip extension in swing phases bilateral, forward head posture.  Reduce gait speed initially after standing from chair.        TODAY'S TREATMENT  08/12/22 TherEx:  Nustep: Level 5 x 8 minutes UE/LE Calf stretch on slant board x 3 holding 30 sec Supine PPT: x 15 holding 5 sec Bridge: x 10 c initial PPT and then holding 5 sec SKTC: x 2 bil LE holding 30 sec Hook lying trunk rotation x 3 bil  LE holding 30 sec Hamstring stretch c strap x 3 bil LE holding 30 sec Manual Thoracolumbar rotational mobs in supine with trunk rotation  Prone STM to bilateral glutes and piriformis Grade 2-3 PA Lumbar mobs Long axis distraction x 2 to each LE holding 20 seconds per pt's request  08/08/2022 Therex:    HEP instruction/performance c cues for techniques, handout provided.  Trial set performed of each for comprehension and symptom assessment.  See below for exercise list       PATIENT EDUCATION:  08/08/2022 Education details: HEP, POC Person educated: Patient Education method: Explanation, Demonstration, Verbal cues, and Handouts Education comprehension: verbalized understanding, returned demonstration, and verbal cues required       HOME EXERCISE PROGRAM: Access Code: [email or web address] URL: [email or web address] Date: 08/08/2022 Prepared by: Chyrel Masson   Exercises - Supine Lower Trunk Rotation  - 2-3 x daily - 7 x weekly - 1 sets - 3-5 reps - 15 hold - Hooklying Single Knee to Chest Stretch  - 2-3 x daily - 7 x weekly - 1 sets - 5 reps - 15 hold - Supine Bridge  -  1-2 x daily - 7 x weekly - 1-2 sets - 10 reps - 2 hold - Standing Lumbar Extension with Counter  - 2-3 x daily - 7 x weekly - 1 sets - 5-10 reps - Gastroc Stretch on Wall  - 2-3 x daily - 7  x weekly - 1 sets - 5-10 reps - 1-2 hold   ASSESSMENT:   CLINICAL IMPRESSION: Pt arriving to therapy today reporting 2/10 low back pain with spasms and radiation down bilateral LE's. Pt tolerating exercises well with improved amb with step through gait pattern and increased stride following using the Nustep and less stiffness reported at end of session. Continue skilled PT to maximize pt's functional mobility and decrease pain.      OBJECTIVE IMPAIRMENTS Abnormal gait, decreased activity tolerance, decreased balance, decreased coordination, decreased endurance, decreased mobility, difficulty walking, decreased ROM, decreased strength, hypomobility, increased fascial restrictions, impaired perceived functional ability, impaired flexibility, improper body mechanics, postural dysfunction, and pain.    ACTIVITY LIMITATIONS carrying, lifting, bending, sitting, standing, squatting, sleeping, stairs, transfers, bed mobility, dressing, and locomotion level   PARTICIPATION LIMITATIONS: meal prep, cleaning, laundry, interpersonal relationship, community activity, occupation, and yard work   PERSONAL FACTORS CAD HTN Hypothyroidism DM CKD hyperlipidemia  are also affecting patient's functional outcome.    REHAB POTENTIAL: Fair to good   CLINICAL DECISION MAKING: Stable/uncomplicated   EVALUATION COMPLEXITY: Low     GOALS: Goals reviewed with patient? Yes   Short term PT Goals (target date for Short term goals are 3 weeks 08/29/2022) Patient will demonstrate independent use of home exercise program to maintain progress from in clinic treatments. Goal status: New   Long term PT goals (target dates for all long term goals are 10 weeks  10/17/2022 )   1. Patient will demonstrate/report pain at worst less than  or equal to 2/10 to facilitate minimal limitation in daily activity secondary to pain symptoms. Goal status: New   2. Patient will demonstrate independent use of home exercise program to facilitate ability to maintain/progress functional gains from skilled physical therapy services. Goal status: New   3. Patient will demonstrate FOTO outcome > or = 62 % to indicate reduced disability due to condition. Goal status: New   4. Patient will demonstrate lumbar extension > or = 50 % WFL s symptoms to facilitate upright standing, walking posture at PLOF s limitation. Goal status: New   5.  Patient will demonstrate sit to stand from 18 inch chair s UE assist on 1st try.    Goal status: New   6.  Patient will demonstrate/report ability to sleep s restriction due to pain.   Goal status: New   7.  Patient will demonstrate ability to return to walking, golf at [facility].  a.  Goal Status: New       PLAN: PT FREQUENCY: 1-2x/week   PT DURATION: 10 weeks   PLANNED INTERVENTIONS: Therapeutic exercises, Therapeutic activity, Neuro Muscular re-education, Balance training, Gait training, Patient/Family education, Joint mobilization, Stair training, DME instructions, Dry Needling, Electrical stimulation, Cryotherapy, Moist heat, Taping, Traction Ultrasound, Ionotophoresis 4mg /ml Dexamethasone, and Manual therapy.  All included unless contraindicated     PLAN FOR NEXT SESSION: General mobility improvements.  Mobs as needed. Possible LE strengthening for HEP.        , PT, MPT 08/12/2022, 11:14 AM

## 2022-08-15 ENCOUNTER — Encounter: Payer: Self-pay | Admitting: Rehabilitative and Restorative Service Providers"

## 2022-08-15 ENCOUNTER — Ambulatory Visit: Payer: Medicare Other | Admitting: Rehabilitative and Restorative Service Providers"

## 2022-08-15 DIAGNOSIS — M79604 Pain in right leg: Secondary | ICD-10-CM | POA: Diagnosis not present

## 2022-08-15 DIAGNOSIS — M79605 Pain in left leg: Secondary | ICD-10-CM | POA: Diagnosis not present

## 2022-08-15 DIAGNOSIS — M6281 Muscle weakness (generalized): Secondary | ICD-10-CM

## 2022-08-15 DIAGNOSIS — M5459 Other low back pain: Secondary | ICD-10-CM | POA: Diagnosis not present

## 2022-08-15 DIAGNOSIS — R262 Difficulty in walking, not elsewhere classified: Secondary | ICD-10-CM

## 2022-08-15 NOTE — Therapy (Signed)
OUTPATIENT PHYSICAL THERAPY TREATMENT NOTE   Patient Name: [name] MRN: [id number] DOB:1930-02-02, 88 y.o., male Today's Date: 08/15/2022  PCP: Flossie Buffy, NP REFERRING PROVIDER: Callie Fielding, MD  END OF SESSION:   PT End of Session - 08/15/22 0903     Visit Number 3    Number of Visits 20    Date for PT Re-Evaluation 10/17/22    Authorization Type [employer] $10 copay    Progress Note Due on Visit 10    PT Start Time 0846    PT Stop Time 0924    PT Time Calculation (min) 38 min    Activity Tolerance Patient tolerated treatment well    Behavior During Therapy [facility] for tasks assessed/performed              Past Medical History:  Diagnosis Date   Coronary artery disease    PCI, STent to RCA   Hyperlipidemia    Rash    Thyroid disease    hypothyroidism   Past Surgical History:  Procedure Laterality Date   APPENDECTOMY     CARDIAC CATHETERIZATION     2007 occluded ramus inter. , PCI to RCA   [place]     Patient Active Problem List   Diagnosis Date Noted   Chronic left-sided low back pain with left-sided sciatica 09/07/2021   Pruritic rash 12/13/2020   Pruritus 12/13/2020   CKD (chronic kidney disease) stage 3, GFR 30-59 ml/min ([place]) 12/24/2019   DM (diabetes mellitus) ([place]) 12/09/2019   Hyperlipidemia 10/09/2015   Hypothyroidism    CAD (coronary artery disease) 02/12/2011   Hypertension 02/12/2011    REFERRING DIAG: E83.151 (ICD-10-CM) - Spinal stenosis of lumbar region with neurogenic claudication  THERAPY DIAG:  Other low back pain  Pain in left leg  Pain in right leg  Muscle weakness (generalized)  Difficulty in walking, not elsewhere classified  Rationale for Evaluation and Treatment Rehabilitation  PERTINENT HISTORY: CAD HTN Hypothyroidism DM CKD hyperlipidemia  PRECAUTIONS: none  SUBJECTIVE:   Pt stated no numbness or tingling.  Pt indicated shooting/spasm feel in legs. Pt indicated some complaints in  back  PAIN:  NPRS scale: at worst yesterday 8/10.  Current 3-4/10 Pain location: lower back upon arrival.  Yesterday front and back of thighs Pain description: achy Aggravating factors: walking Relieving factors: over the counter pain meds   OBJECTIVE: (objective measures completed at initial evaluation unless otherwise dated)  PATIENT SURVEYS:  08/08/2022 FOTO intake:  43  predicted:  62   SCREENING FOR RED FLAGS: 08/08/2022 Bowel or bladder incontinence: No Cauda equina syndrome: No   COGNITION: 08/08/2022            Overall cognitive status: WFL normal              SENSATION: 08/08/2022 WFL   POSTURE:  08/08/2022 mild forward trunk lean, reduced lumbar lordosis, limited hip extension bilateral   PALPATION: 08/08/2022 No specific tenderness noted in back today upon inspection   LUMBAR ROM:    Active  AROM  08/08/2022 AROM 08/15/2022  Flexion Movement to ankles without pain complaints change   Extension To neutral c tightness in front of hips and back   X 5 in standing - improved to 25% WFL c no centralization/peripheralization 25% c pain noted in back of legs, improved after 5 reps and movement improved to 75% WFL  Right lateral flexion     Left lateral flexion     Right rotation  Left rotation     No indication of centralization or peripheralization c active movement noted in clinic today.  Hypomobility in movement noted.     (Blank rows = not tested)   LOWER EXTREMITY ROM:        Right 08/08/2022 Left 08/08/2022  Hip flexion      Hip extension      Hip abduction      Hip adduction      Hip internal rotation      Hip external rotation      Knee flexion      Knee extension      Ankle dorsiflexion      Ankle plantarflexion      Ankle inversion      Ankle eversion       (Blank rows = not tested)   LOWER EXTREMITY MMT:     MMT Right 08/08/2022 Left 08/08/2022  Hip flexion 5/5 5/5  Hip extension      Hip abduction      Hip adduction      Hip  internal rotation      Hip external rotation      Knee flexion 5/5 5/5  Knee extension 4+/5 4+5/5  Ankle dorsiflexion 5/5 5/5  Ankle plantarflexion      Ankle inversion      Ankle eversion       (Blank rows = not tested)   LUMBAR SPECIAL TESTS:  08/08/2022 (-) slump bilateral, + thomas test bilateral for hip flexor tightness   FUNCTIONAL TESTS:  08/08/2022 18 inch chair transfer s UE assist on several tries Lt and Rt SLS < 3 seconds c min A to prevent loss of balance   GAIT: 08/08/2022 Independent ambulation c mild forward trunk lean, reduced hip extension in swing phases bilateral, forward head posture.  Reduce gait speed initially after standing from chair.        TODAY'S TREATMENT  08/15/2022: Therex: Standing lumbar extension x 5  Prone hip extension 2-3 sec hold x 10 bilateral Supine lumbar trunk rotation stretch 15 sec x 3 bilateral Supine bridge x 10 2-3 sec hold  Sit to stand to sit 18 inch chair x 10 c slow lowering focus and reaching upright posture in each rep Incline stretch 30 sec x 3  Manual: Prone cPA G3 L2-L5     08/12/2022 TherEx:  Nustep: Level 5 x 8 minutes UE/LE Calf stretch on slant board x 3 holding 30 sec Supine PPT: x 15 holding 5 sec Bridge: x 10 c initial PPT and then holding 5 sec SKTC: x 2 bil LE holding 30 sec Hook lying trunk rotation x 3 bil  LE holding 30 sec Hamstring stretch c strap x 3 bil LE holding 30 sec  Manual Thoracolumbar rotational mobs in supine with trunk rotation  Prone STM to bilateral glutes and piriformis Grade 2-3 PA Lumbar mobs Long axis distraction x 2 to each LE holding 20 seconds per pt's request  08/08/2022 Therex:    HEP instruction/performance c cues for techniques, handout provided.  Trial set performed of each for comprehension and symptom assessment.  See below for exercise list       PATIENT EDUCATION:  08/15/2022 Education details: HEP update  Person educated: Patient Education method:  Explanation, Demonstration, Verbal cues, and Handouts Education comprehension: verbalized understanding, returned demonstration, and verbal cues required      HOME EXERCISE PROGRAM: Access Code: [email or web address] URL: [email or web address] Date: 08/15/2022 Prepared by: Chyrel Masson  Exercises - Supine  Lower Trunk Rotation  - 2-3 x daily - 7 x weekly - 1 sets - 3-5 reps - 15 hold - Hooklying Single Knee to Chest Stretch  - 2-3 x daily - 7 x weekly - 1 sets - 5 reps - 15 hold - Supine Bridge  - 1-2 x daily - 7 x weekly - 1-2 sets - 10 reps - 2 hold - Standing Lumbar Extension with Counter  - 2-3 x daily - 7 x weekly - 1 sets - 5-10 reps - Gastroc Stretch on Wall  - 2-3 x daily - 7 x weekly - 1 sets - 5-10 reps - 1-2 hold - Prone Hip Extension  - 1 x daily - 7 x weekly - 1 sets - 10-15 reps - 2-3 hold - Sit to Stand  - 3 x daily - 7 x weekly - 1 sets - 10 reps   ASSESSMENT:   CLINICAL IMPRESSION: Recheck today of lumbar mobility and assessment of any directional preference did not reveal a centralization or peripheralization of flexion or extension movement.   Of note, repeated extension improved mobility and decreased end range pain noted.  Continued introduction and performance of LE strengthening.      OBJECTIVE IMPAIRMENTS Abnormal gait, decreased activity tolerance, decreased balance, decreased coordination, decreased endurance, decreased mobility, difficulty walking, decreased ROM, decreased strength, hypomobility, increased fascial restrictions, impaired perceived functional ability, impaired flexibility, improper body mechanics, postural dysfunction, and pain.    ACTIVITY LIMITATIONS carrying, lifting, bending, sitting, standing, squatting, sleeping, stairs, transfers, bed mobility, dressing, and locomotion level   PARTICIPATION LIMITATIONS: meal prep, cleaning, laundry, interpersonal relationship, community activity, occupation, and yard work   PERSONAL FACTORS CAD HTN  Hypothyroidism DM CKD hyperlipidemia  are also affecting patient's functional outcome.    REHAB POTENTIAL: Fair to good   CLINICAL DECISION MAKING: Stable/uncomplicated   EVALUATION COMPLEXITY: Low     GOALS: Goals reviewed with patient? Yes   Short term PT Goals (target date for Short term goals are 3 weeks 08/29/2022) Patient will demonstrate independent use of home exercise program to maintain progress from in clinic treatments. Goal status: on going - assessed 08/15/2022   Long term PT goals (target dates for all long term goals are 10 weeks  10/17/2022 )   1. Patient will demonstrate/report pain at worst less than or equal to 2/10 to facilitate minimal limitation in daily activity secondary to pain symptoms. Goal status: New   2. Patient will demonstrate independent use of home exercise program to facilitate ability to maintain/progress functional gains from skilled physical therapy services. Goal status: New   3. Patient will demonstrate FOTO outcome > or = 62 % to indicate reduced disability due to condition. Goal status: New   4. Patient will demonstrate lumbar extension > or = 50 % WFL s symptoms to facilitate upright standing, walking posture at PLOF s limitation. Goal status: New   5.  Patient will demonstrate sit to stand from 18 inch chair s UE assist on 1st try.    Goal status: New   6.  Patient will demonstrate/report ability to sleep s restriction due to pain.   Goal status: New   7.  Patient will demonstrate ability to return to walking, golf at [facility].  a.  Goal Status: New       PLAN: PT FREQUENCY: 1-2x/week   PT DURATION: 10 weeks   PLANNED INTERVENTIONS: Therapeutic exercises, Therapeutic activity, Neuro Muscular re-education, Balance training, Gait training, Patient/Family education, Joint mobilization, Stair training,  DME instructions, Dry Needling, Electrical stimulation, Cryotherapy, Moist heat, Taping, Traction Ultrasound, Ionotophoresis 4mg /ml  Dexamethasone, and Manual therapy.  All included unless contraindicated     PLAN FOR NEXT SESSION: General mobility improvements.  Mobs as needed. Possible LE strengthening for HEP.     , PT, DPT, OCS, ATC 08/15/22  9:25 AM

## 2022-08-19 ENCOUNTER — Ambulatory Visit: Payer: Medicare Other | Admitting: Physical Therapy

## 2022-08-19 ENCOUNTER — Encounter: Payer: Self-pay | Admitting: Physical Therapy

## 2022-08-19 DIAGNOSIS — M79604 Pain in right leg: Secondary | ICD-10-CM

## 2022-08-19 DIAGNOSIS — M79605 Pain in left leg: Secondary | ICD-10-CM

## 2022-08-19 DIAGNOSIS — R262 Difficulty in walking, not elsewhere classified: Secondary | ICD-10-CM

## 2022-08-19 DIAGNOSIS — M6281 Muscle weakness (generalized): Secondary | ICD-10-CM

## 2022-08-19 DIAGNOSIS — M5459 Other low back pain: Secondary | ICD-10-CM | POA: Diagnosis not present

## 2022-08-19 NOTE — Therapy (Signed)
OUTPATIENT PHYSICAL THERAPY TREATMENT NOTE   Patient Name: [name] MRN: [id number] DOB:[birth year], 88 y.o., male Today's Date: 08/19/2022  PCP: Flossie Buffy, NP REFERRING PROVIDER: Callie Fielding, MD  END OF SESSION:   PT End of Session - 08/19/22 1020     Visit Number 4    Number of Visits 20    Date for PT Re-Evaluation 10/17/22    Authorization Type [employer] $10 copay    Progress Note Due on Visit 10    PT Start Time 1015    PT Stop Time 1100    PT Time Calculation (min) 45 min    Activity Tolerance Patient tolerated treatment well    Behavior During Therapy WFL for tasks assessed/performed               Past Medical History:  Diagnosis Date   Coronary artery disease    PCI, STent to RCA   Hyperlipidemia    Rash    Thyroid disease    hypothyroidism   Past Surgical History:  Procedure Laterality Date   APPENDECTOMY     CARDIAC CATHETERIZATION     2007 occluded ramus inter. , PCI to RCA   [place]     Patient Active Problem List   Diagnosis Date Noted   Chronic left-sided low back pain with left-sided sciatica 09/07/2021   Pruritic rash 12/13/2020   Pruritus 12/13/2020   CKD (chronic kidney disease) stage 3, GFR 30-59 ml/min ([place]) 12/24/2019   DM (diabetes mellitus) ([place]) 12/09/2019   Hyperlipidemia 10/09/2015   Hypothyroidism    CAD (coronary artery disease) 02/12/2011   Hypertension 02/12/2011    REFERRING DIAG: A41.660 (ICD-10-CM) - Spinal stenosis of lumbar region with neurogenic claudication  THERAPY DIAG:  Other low back pain  Pain in left leg  Pain in right leg  Muscle weakness (generalized)  Difficulty in walking, not elsewhere classified  Rationale for Evaluation and Treatment Rehabilitation  PERTINENT HISTORY: CAD HTN Hypothyroidism DM CKD hyperlipidemia  PRECAUTIONS: none  SUBJECTIVE:   Pt stating episodes of cramping in his hamstring over the weekend. Pt stating pain at present is 3/10 but  pain can still reach 8/10 at times.   PAIN:  NPRS scale: at worst yesterday 8/10.  Current 3/10 Pain location: lower back upon arrival.  Yesterday front and back of thighs Pain description: achy Aggravating factors: walking Relieving factors: over the counter pain meds   OBJECTIVE: (objective measures completed at initial evaluation unless otherwise dated)  PATIENT SURVEYS:  08/08/2022 FOTO intake:  43  predicted:  62   SCREENING FOR RED FLAGS: 08/08/2022 Bowel or bladder incontinence: No Cauda equina syndrome: No   COGNITION: 08/08/2022            Overall cognitive status: WFL normal              SENSATION: 08/08/2022 WFL   POSTURE:  08/08/2022 mild forward trunk lean, reduced lumbar lordosis, limited hip extension bilateral   PALPATION: 08/08/2022 No specific tenderness noted in back today upon inspection   LUMBAR ROM:    Active  AROM  08/08/2022 AROM 08/15/2022  Flexion Movement to ankles without pain complaints change   Extension To neutral c tightness in front of hips and back   X 5 in standing - improved to 25% WFL c no centralization/peripheralization 25% c pain noted in back of legs, improved after 5 reps and movement improved to 75% WFL  Right lateral flexion     Left lateral  flexion     Right rotation     Left rotation     No indication of centralization or peripheralization c active movement noted in clinic today.  Hypomobility in movement noted.     (Blank rows = not tested)   LOWER EXTREMITY ROM:        Right 08/08/2022 Left 08/08/2022  Hip flexion      Hip extension      Hip abduction      Hip adduction      Hip internal rotation      Hip external rotation      Knee flexion      Knee extension      Ankle dorsiflexion      Ankle plantarflexion      Ankle inversion      Ankle eversion       (Blank rows = not tested)   LOWER EXTREMITY MMT:     MMT Right 08/08/2022 Left 08/08/2022  Hip flexion 5/5 5/5  Hip extension      Hip abduction       Hip adduction      Hip internal rotation      Hip external rotation      Knee flexion 5/5 5/5  Knee extension 4+/5 4+5/5  Ankle dorsiflexion 5/5 5/5  Ankle plantarflexion      Ankle inversion      Ankle eversion       (Blank rows = not tested)   LUMBAR SPECIAL TESTS:  08/08/2022 (-) slump bilateral, + thomas test bilateral for hip flexor tightness   FUNCTIONAL TESTS:  08/08/2022 18 inch chair transfer s UE assist on several tries Lt and Rt SLS < 3 seconds c min A to prevent loss of balance   GAIT: 08/08/2022 Independent ambulation c mild forward trunk lean, reduced hip extension in swing phases bilateral, forward head posture.  Reduce gait speed initially after standing from chair.        TODAY'S TREATMENT  08/19/22:  TherEx:  Nustep Level 5 x 8 minutes Standing: calf/heel raises x 20 c UE support Standing hip abd x 10 each LE c UE support Standing mini squats c UE support in parallel bars x 10 Standing trunk extension c elbows against the wall x 10 holding 10 sec Prone hip extension c 2 # weights 2 x 10 each LE Prone on elbows x 1 minute Supine trunk rotation x 2 each side holding 30 seconds SLR: x 10 each LE Manual:  Prone PA Grade 2-3 L2-L5 mobs STM to bilateral hamstrings    08/15/2022: Therex: Standing lumbar extension x 5  Prone hip extension 2-3 sec hold x 10 bilateral Supine lumbar trunk rotation stretch 15 sec x 3 bilateral Supine bridge x 10 2-3 sec hold  Sit to stand to sit 18 inch chair x 10 c slow lowering focus and reaching upright posture in each rep Incline stretch 30 sec x 3  Manual: Prone cPA G3 L2-L5     08/12/2022 TherEx:  Nustep: Level 5 x 8 minutes UE/LE Calf stretch on slant board x 3 holding 30 sec Supine PPT: x 15 holding 5 sec Bridge: x 10 c initial PPT and then holding 5 sec SKTC: x 2 bil LE holding 30 sec Hook lying trunk rotation x 3 bil  LE holding 30 sec Hamstring stretch c strap x 3 bil LE holding 30  sec  Manual Thoracolumbar rotational mobs in supine with trunk rotation  Prone STM to bilateral glutes  and piriformis Grade 2-3 PA Lumbar mobs Long axis distraction x 2 to each LE holding 20 seconds per pt's request         PATIENT EDUCATION:  08/15/2022 Education details: HEP update  Person educated: Patient Education method: Programmer, multimedia, Demonstration, Verbal cues, and Handouts Education comprehension: verbalized understanding, returned demonstration, and verbal cues required      HOME EXERCISE PROGRAM: Access Code: [email or web address] URL: [email or web address] Date: 08/19/2022 Prepared by: Narda Amber  Exercises - Supine Lower Trunk Rotation  - 2-3 x daily - 7 x weekly - 1 sets - 3-5 reps - 15 hold - Hooklying Single Knee to Chest Stretch  - 2-3 x daily - 7 x weekly - 1 sets - 5 reps - 15 hold - Supine Bridge  - 1-2 x daily - 7 x weekly - 1-2 sets - 10 reps - 2 hold - Standing Lumbar Extension with Counter  - 2-3 x daily - 7 x weekly - 1 sets - 5-10 reps - Gastroc Stretch on Wall  - 2-3 x daily - 7 x weekly - 1 sets - 5-10 reps - 1-2 hold - Prone Hip Extension  - 1 x daily - 7 x weekly - 1 sets - 10-15 reps - 2-3 hold - Sit to Stand  - 2 x daily - 7 x weekly - 1 sets - 10 reps - Heel Raises with Counter Support  - 1-2 x daily - 7 x weekly - 2 sets - 10 reps - Standing Hip Abduction with Counter Support  - 1-2 x daily - 7 x weekly - 2 sets - 10 reps - Seated Hamstring Stretch  - 1-2 x daily - 7 x weekly - 3 reps - 30 seconds hold    ASSESSMENT:   CLINICAL IMPRESSION: Pt arriving today reporting hamstring cramping over the weekend in his left LE. Pt was edu on stretching and extended holds with no bouncing. Treatment session focused on adding more LE strengthening along with extension reinforcements to improve lumbar mobility.Pt's HEP was updated for more LE strengthening and hamstring stretching.  Continue skilled PT to maximize pt's function.      OBJECTIVE  IMPAIRMENTS Abnormal gait, decreased activity tolerance, decreased balance, decreased coordination, decreased endurance, decreased mobility, difficulty walking, decreased ROM, decreased strength, hypomobility, increased fascial restrictions, impaired perceived functional ability, impaired flexibility, improper body mechanics, postural dysfunction, and pain.    ACTIVITY LIMITATIONS carrying, lifting, bending, sitting, standing, squatting, sleeping, stairs, transfers, bed mobility, dressing, and locomotion level   PARTICIPATION LIMITATIONS: meal prep, cleaning, laundry, interpersonal relationship, community activity, occupation, and yard work   PERSONAL FACTORS CAD HTN Hypothyroidism DM CKD hyperlipidemia  are also affecting patient's functional outcome.    REHAB POTENTIAL: Fair to good   CLINICAL DECISION MAKING: Stable/uncomplicated   EVALUATION COMPLEXITY: Low     GOALS: Goals reviewed with patient? Yes   Short term PT Goals (target date for Short term goals are 3 weeks 08/29/2022) Patient will demonstrate independent use of home exercise program to maintain progress from in clinic treatments. Goal status: on going - assessed 08/15/2022   Long term PT goals (target dates for all long term goals are 10 weeks  10/17/2022 )   1. Patient will demonstrate/report pain at worst less than or equal to 2/10 to facilitate minimal limitation in daily activity secondary to pain symptoms. Goal status: New   2. Patient will demonstrate independent use of home exercise program to facilitate ability to maintain/progress functional gains from skilled physical therapy services.  Goal status: New   3. Patient will demonstrate FOTO outcome > or = 62 % to indicate reduced disability due to condition. Goal status: New   4. Patient will demonstrate lumbar extension > or = 50 % WFL s symptoms to facilitate upright standing, walking posture at PLOF s limitation. Goal status: New   5.  Patient will  demonstrate sit to stand from 18 inch chair s UE assist on 1st try.    Goal status: On-going 08/19/22   6.  Patient will demonstrate/report ability to sleep s restriction due to pain.   Goal status: New   7.  Patient will demonstrate ability to return to walking, golf at [facility].  a.  Goal Status: New       PLAN: PT FREQUENCY: 1-2x/week   PT DURATION: 10 weeks   PLANNED INTERVENTIONS: Therapeutic exercises, Therapeutic activity, Neuro Muscular re-education, Balance training, Gait training, Patient/Family education, Joint mobilization, Stair training, DME instructions, Dry Needling, Electrical stimulation, Cryotherapy, Moist heat, Taping, Traction Ultrasound, Ionotophoresis 4mg /ml Dexamethasone, and Manual therapy.  All included unless contraindicated     PLAN FOR NEXT SESSION: General mobility improvements.  Mobs as needed. LE strengthening, extension based focus   , PT, MPT 08/19/22 10:21 AM   08/19/22  10:21 AM

## 2022-08-20 ENCOUNTER — Telehealth: Payer: Self-pay | Admitting: Orthopedic Surgery

## 2022-08-20 NOTE — Telephone Encounter (Signed)
Pt called requesting refill of methocarbomol. From Dr Laurance Flatten. He states he is going out of town this week and need refill tomorrow. Please send to [facility] and [place]. Pt phone number is [phone].

## 2022-08-21 ENCOUNTER — Ambulatory Visit: Payer: Medicare Other | Admitting: Rehabilitative and Restorative Service Providers"

## 2022-08-21 ENCOUNTER — Encounter: Payer: Self-pay | Admitting: Rehabilitative and Restorative Service Providers"

## 2022-08-21 DIAGNOSIS — M79604 Pain in right leg: Secondary | ICD-10-CM | POA: Diagnosis not present

## 2022-08-21 DIAGNOSIS — M79605 Pain in left leg: Secondary | ICD-10-CM | POA: Diagnosis not present

## 2022-08-21 DIAGNOSIS — M6281 Muscle weakness (generalized): Secondary | ICD-10-CM

## 2022-08-21 DIAGNOSIS — R262 Difficulty in walking, not elsewhere classified: Secondary | ICD-10-CM

## 2022-08-21 DIAGNOSIS — M5459 Other low back pain: Secondary | ICD-10-CM | POA: Diagnosis not present

## 2022-08-21 MED ORDER — METHOCARBAMOL 500 MG PO TABS: 500.0000 mg | ORAL_TABLET | Freq: Three times a day (TID) | ORAL | 1 refills | Status: DC | PRN

## 2022-08-21 NOTE — Therapy (Signed)
OUTPATIENT PHYSICAL THERAPY TREATMENT NOTE   Patient Name: [name] MRN: [id number] DOB:1930-01-29, 88 y.o., male Today's Date: 08/21/2022  PCP: Flossie Buffy, NP REFERRING PROVIDER: Callie Fielding, MD  END OF SESSION:   PT End of Session - 08/21/22 1114     Visit Number 5    Number of Visits 20    Date for PT Re-Evaluation 10/17/22    Authorization Type BCBS $10 copay    Progress Note Due on Visit 10    PT Start Time 1100    PT Stop Time 1139    PT Time Calculation (min) 39 min    Activity Tolerance Patient tolerated treatment well    Behavior During Therapy WFL for tasks assessed/performed                Past Medical History:  Diagnosis Date   Coronary artery disease    PCI, STent to RCA   Hyperlipidemia    Rash    Thyroid disease    hypothyroidism   Past Surgical History:  Procedure Laterality Date   APPENDECTOMY     CARDIAC CATHETERIZATION     2007 occluded ramus inter. , PCI to RCA   [place]     Patient Active Problem List   Diagnosis Date Noted   Chronic left-sided low back pain with left-sided sciatica 09/07/2021   Pruritic rash 12/13/2020   Pruritus 12/13/2020   CKD (chronic kidney disease) stage 3, GFR 30-59 ml/min ([place]) 12/24/2019   DM (diabetes mellitus) ([place]) 12/09/2019   Hyperlipidemia 10/09/2015   Hypothyroidism    CAD (coronary artery disease) 02/12/2011   Hypertension 02/12/2011    REFERRING DIAG: X91.478 (ICD-10-CM) - Spinal stenosis of lumbar region with neurogenic claudication  THERAPY DIAG:  Other low back pain  Pain in left leg  Pain in right leg  Muscle weakness (generalized)  Difficulty in walking, not elsewhere classified  Rationale for Evaluation and Treatment Rehabilitation  PERTINENT HISTORY: CAD HTN Hypothyroidism DM CKD hyperlipidemia  PRECAUTIONS: none  SUBJECTIVE:  Pt indicated having trouble yesterday but not sure why.  Felt back of legs and front of thighs mainly.   Chief movement noted c walking and transfers.   PAIN:  NPRS scale: 0/10 at rest sitting, up to 4/10 with walking in testing today Pain location: lower back upon arrival.  Yesterday front and back of thighs Pain description: achy Aggravating factors: walking Relieving factors: over the counter pain meds   OBJECTIVE: (objective measures completed at initial evaluation unless otherwise dated)  PATIENT SURVEYS:  08/08/2022 FOTO intake:  43  predicted:  62   SCREENING FOR RED FLAGS: 08/08/2022 Bowel or bladder incontinence: No Cauda equina syndrome: No   COGNITION: 08/08/2022            Overall cognitive status: WFL normal              SENSATION: 08/08/2022 WFL   POSTURE:  08/08/2022 mild forward trunk lean, reduced lumbar lordosis, limited hip extension bilateral   PALPATION: 08/08/2022 No specific tenderness noted in back today upon inspection   LUMBAR ROM:    Active  AROM  08/08/2022 AROM 08/15/2022  Flexion Movement to ankles without pain complaints change   Extension To neutral c tightness in front of hips and back   X 5 in standing - improved to 25% WFL c no centralization/peripheralization 25% c pain noted in back of legs, improved after 5 reps and movement improved to 75% WFL  Right lateral flexion  Left lateral flexion     Right rotation     Left rotation     No indication of centralization or peripheralization c active movement noted in clinic today.  Hypomobility in movement noted.     (Blank rows = not tested)   LOWER EXTREMITY ROM:        Right 08/08/2022 Left 08/08/2022  Hip flexion      Hip extension      Hip abduction      Hip adduction      Hip internal rotation      Hip external rotation      Knee flexion      Knee extension      Ankle dorsiflexion      Ankle plantarflexion      Ankle inversion      Ankle eversion       (Blank rows = not tested)   LOWER EXTREMITY MMT:     MMT Right 08/08/2022 Left 08/08/2022  Hip flexion 5/5 5/5  Hip  extension      Hip abduction      Hip adduction      Hip internal rotation      Hip external rotation      Knee flexion 5/5 5/5  Knee extension 4+/5 4+5/5  Ankle dorsiflexion 5/5 5/5  Ankle plantarflexion      Ankle inversion      Ankle eversion       (Blank rows = not tested)   LUMBAR SPECIAL TESTS:  08/08/2022 (-) slump bilateral, + thomas test bilateral for hip flexor tightness   FUNCTIONAL TESTS:  08/21/2022: Treadmill walking at self selected walking pace.  Flat surface 5 mins around 1.6 mph (complaints of mild symptoms in back of thighs within 2 mins, stayed consistent for rest of 5 mins  - rated at 4/10).  Rest 3 mins then walking at 1.6 mph at incline 5.0 level 5 mins - symptoms at 2/10 at end of 5 mins  08/08/2022 18 inch chair transfer s UE assist on several tries Lt and Rt SLS < 3 seconds c min A to prevent loss of balance   GAIT: 08/08/2022 Independent ambulation c mild forward trunk lean, reduced hip extension in swing phases bilateral, forward head posture.  Reduce gait speed initially after standing from chair.        TODAY'S TREATMENT  08/21/2022 Therex: Treadmill walking at self selected walking pace.  Flat surface 5 mins around 1.6 mph (complaints of mild symptoms in back of thighs within 2 mins, stayed consistent for rest of 5 mins  - rated at 4/10).  Rest 3 mins then walking at 1.6 mph at incline 5.0 level 5 mins - symptoms at 2/10 at end of 5 mins  Single knee to chest 15 sec x 3 bilateral Figure 4 push away stretch for hip mobility 15 sec x 3 bilateral (slide opposite leg lower to allow movement) Supine lumbar trunk rotation 15 sec x 3 bilateral Supine bridge 2- 3 sec hold x 15  08/19/22:  TherEx:  Nustep Level 5 x 8 minutes Standing: calf/heel raises x 20 c UE support Standing hip abd x 10 each LE c UE support Standing mini squats c UE support in parallel bars x 10 Standing trunk extension c elbows against the wall x 10 holding 10 sec Prone hip  extension c 2 # weights 2 x 10 each LE Prone on elbows x 1 minute Supine trunk rotation x 2 each side holding 30  seconds SLR: x 10 each LE  Manual:  Prone PA Grade 2-3 L2-L5 mobs STM to bilateral hamstrings   08/15/2022: Therex: Standing lumbar extension x 5  Prone hip extension 2-3 sec hold x 10 bilateral Supine lumbar trunk rotation stretch 15 sec x 3 bilateral Supine bridge x 10 2-3 sec hold  Sit to stand to sit 18 inch chair x 10 c slow lowering focus and reaching upright posture in each rep Incline stretch 30 sec x 3  Manual: Prone cPA G3 L2-L5       PATIENT EDUCATION:  08/15/2022 Education details: HEP update  Person educated: Patient Education method: Programmer, multimedia, Demonstration, Verbal cues, and Handouts Education comprehension: verbalized understanding, returned demonstration, and verbal cues required      HOME EXERCISE PROGRAM: Access Code: [email or web address] URL: [email or web address] Date: 08/19/2022 Prepared by: Narda Amber  Exercises - Supine Lower Trunk Rotation  - 2-3 x daily - 7 x weekly - 1 sets - 3-5 reps - 15 hold - Hooklying Single Knee to Chest Stretch  - 2-3 x daily - 7 x weekly - 1 sets - 5 reps - 15 hold - Supine Bridge  - 1-2 x daily - 7 x weekly - 1-2 sets - 10 reps - 2 hold - Standing Lumbar Extension with Counter  - 2-3 x daily - 7 x weekly - 1 sets - 5-10 reps - Gastroc Stretch on Wall  - 2-3 x daily - 7 x weekly - 1 sets - 5-10 reps - 1-2 hold - Prone Hip Extension  - 1 x daily - 7 x weekly - 1 sets - 10-15 reps - 2-3 hold - Sit to Stand  - 2 x daily - 7 x weekly - 1 sets - 10 reps - Heel Raises with Counter Support  - 1-2 x daily - 7 x weekly - 2 sets - 10 reps - Standing Hip Abduction with Counter Support  - 1-2 x daily - 7 x weekly - 2 sets - 10 reps - Seated Hamstring Stretch  - 1-2 x daily - 7 x weekly - 3 reps - 30 seconds hold    ASSESSMENT:   CLINICAL IMPRESSION: Based off results of treadmill walking test and  complaints with walking while at home outside of clinic, combination of possible flexion directional preference with mobility deficits within lumbar and hip impair functional activity.  Encouraged focus on flexion based movements in times of symptoms to help relieve pain symptoms with long term focus on general mobility improvements.      OBJECTIVE IMPAIRMENTS Abnormal gait, decreased activity tolerance, decreased balance, decreased coordination, decreased endurance, decreased mobility, difficulty walking, decreased ROM, decreased strength, hypomobility, increased fascial restrictions, impaired perceived functional ability, impaired flexibility, improper body mechanics, postural dysfunction, and pain.    ACTIVITY LIMITATIONS carrying, lifting, bending, sitting, standing, squatting, sleeping, stairs, transfers, bed mobility, dressing, and locomotion level   PARTICIPATION LIMITATIONS: meal prep, cleaning, laundry, interpersonal relationship, community activity, occupation, and yard work   PERSONAL FACTORS CAD HTN Hypothyroidism DM CKD hyperlipidemia  are also affecting patient's functional outcome.    REHAB POTENTIAL: Fair to good   CLINICAL DECISION MAKING: Stable/uncomplicated   EVALUATION COMPLEXITY: Low     GOALS: Goals reviewed with patient? Yes   Short term PT Goals (target date for Short term goals are 3 weeks 08/29/2022) Patient will demonstrate independent use of home exercise program to maintain progress from in clinic treatments. Goal status: on going - assessed 08/15/2022   Long term PT goals (target  dates for all long term goals are 10 weeks  10/17/2022 )   1. Patient will demonstrate/report pain at worst less than or equal to 2/10 to facilitate minimal limitation in daily activity secondary to pain symptoms. Goal status: on going - assessed 08/21/2022   2. Patient will demonstrate independent use of home exercise program to facilitate ability to maintain/progress functional  gains from skilled physical therapy services. Goal status: on going - assessed 08/21/2022   3. Patient will demonstrate FOTO outcome > or = 62 % to indicate reduced disability due to condition. Goal status: on going - assessed 08/21/2022   4. Patient will demonstrate lumbar extension > or = 50 % WFL s symptoms to facilitate upright standing, walking posture at PLOF s limitation. Goal status: on going - assessed 08/21/2022   5.  Patient will demonstrate sit to stand from 18 inch chair s UE assist on 1st try.    Goal status: On-going 08/19/22   6.  Patient will demonstrate/report ability to sleep s restriction due to pain.   Goal status: on going - assessed 08/21/2022   7.  Patient will demonstrate ability to return to walking, golf at [facility].  a.  Goal Status: on going - assessed 08/21/2022       PLAN: PT FREQUENCY: 1-2x/week   PT DURATION: 10 weeks   PLANNED INTERVENTIONS: Therapeutic exercises, Therapeutic activity, Neuro Muscular re-education, Balance training, Gait training, Patient/Family education, Joint mobilization, Stair training, DME instructions, Dry Needling, Electrical stimulation, Cryotherapy, Moist heat, Taping, Traction Ultrasound, Ionotophoresis 4mg /ml Dexamethasone, and Manual therapy.  All included unless contraindicated     PLAN FOR NEXT SESSION: Check on flexion movement response effect.     , PT, DPT, OCS, ATC 08/21/22  11:36 AM

## 2022-08-22 NOTE — Telephone Encounter (Signed)
I called patient and advised. 

## 2022-09-07 ENCOUNTER — Other Ambulatory Visit: Payer: Self-pay | Admitting: Nurse Practitioner

## 2022-09-07 DIAGNOSIS — E039 Hypothyroidism, unspecified: Secondary | ICD-10-CM

## 2022-09-09 ENCOUNTER — Ambulatory Visit: Payer: Medicare Other | Admitting: Rehabilitative and Restorative Service Providers"

## 2022-09-09 ENCOUNTER — Encounter: Payer: Self-pay | Admitting: Rehabilitative and Restorative Service Providers"

## 2022-09-09 DIAGNOSIS — M5459 Other low back pain: Secondary | ICD-10-CM

## 2022-09-09 DIAGNOSIS — M79605 Pain in left leg: Secondary | ICD-10-CM | POA: Diagnosis not present

## 2022-09-09 DIAGNOSIS — R262 Difficulty in walking, not elsewhere classified: Secondary | ICD-10-CM

## 2022-09-09 DIAGNOSIS — M6281 Muscle weakness (generalized): Secondary | ICD-10-CM | POA: Diagnosis not present

## 2022-09-09 DIAGNOSIS — M79604 Pain in right leg: Secondary | ICD-10-CM

## 2022-09-09 NOTE — Telephone Encounter (Signed)
Chart supports Rx Last OV: 07/2022 Next OV: not scheduled  

## 2022-09-09 NOTE — Therapy (Addendum)
OUTPATIENT PHYSICAL THERAPY TREATMENT NOTE /PROGRESS NOTE /DISCHARGE   Patient Name: Olof Marcil MRN: [id number] DOB:26-Jun-1930, 88 y.o., male Today's Date: 09/09/2022  PCP: Flossie Buffy, NP REFERRING PROVIDER: Callie Fielding, MD  Progress Note Reporting Period 08/08/2022 to 09/09/2022  See note below for Objective Data and Assessment of Progress/Goals.      END OF SESSION:   PT End of Session - 09/09/22 1020     Visit Number 6    Number of Visits 20    Date for PT Re-Evaluation 10/17/22    Authorization Type BCBS $10 copay    Progress Note Due on Visit 10    PT Start Time 1013    PT Stop Time 1042    PT Time Calculation (min) 29 min    Activity Tolerance Patient tolerated treatment well    Behavior During Therapy WFL for tasks assessed/performed                 Past Medical History:  Diagnosis Date   Coronary artery disease    PCI, STent to RCA   Hyperlipidemia    Rash    Thyroid disease    hypothyroidism   Past Surgical History:  Procedure Laterality Date   APPENDECTOMY     CARDIAC CATHETERIZATION     2007 occluded ramus inter. , PCI to RCA   CARPAL TUNNEL RELEASE     Patient Active Problem List   Diagnosis Date Noted   Chronic left-sided low back pain with left-sided sciatica 09/07/2021   Pruritic rash 12/13/2020   Pruritus 12/13/2020   CKD (chronic kidney disease) stage 3, GFR 30-59 ml/min ([place]) 12/24/2019   DM (diabetes mellitus) ([place]) 12/09/2019   Hyperlipidemia 10/09/2015   Hypothyroidism    CAD (coronary artery disease) 02/12/2011   Hypertension 02/12/2011    REFERRING DIAG: T73.220 (ICD-10-CM) - Spinal stenosis of lumbar region with neurogenic claudication  THERAPY DIAG:  Other low back pain  Pain in left leg  Pain in right leg  Muscle weakness (generalized)  Difficulty in walking, not elsewhere classified  Rationale for Evaluation and Treatment Rehabilitation  PERTINENT HISTORY: CAD HTN Hypothyroidism DM  CKD hyperlipidemia  PRECAUTIONS: none  SUBJECTIVE:  Pt indicated having spasms and pain into legs after prolonged sitting and standing.  Pt indicated he was uncertain why it started all of a sudden.  Reported overall about the same since last visit.   PAIN:  NPRS scale: 0/10 at rest sitting, up to 10/10 with walking in testing today Pain location: lower back upon arrival.  Yesterday front and back of thighs Pain description: achy Aggravating factors: walking Relieving factors: over the counter pain meds   OBJECTIVE: (objective measures completed at initial evaluation unless otherwise dated)  PATIENT SURVEYS:  09/09/2022 FOTO update: 40 %  08/08/2022 FOTO intake:  43  predicted:  62   SCREENING FOR RED FLAGS: 08/08/2022 Bowel or bladder incontinence: No Cauda equina syndrome: No   COGNITION: 08/08/2022            Overall cognitive status: WFL normal              SENSATION: 08/08/2022 WFL   POSTURE:  08/08/2022 mild forward trunk lean, reduced lumbar lordosis, limited hip extension bilateral   PALPATION: 08/08/2022 No specific tenderness noted in back today upon inspection   LUMBAR ROM:    Active  AROM  08/08/2022 AROM 08/15/2022 AROM  09/09/2022  Flexion Movement to ankles without pain complaints change  Movement to ankles without  pain  Extension To neutral c tightness in front of hips and back   X 5 in standing - improved to 25% WFL c no centralization/peripheralization 25% c pain noted in back of legs, improved after 5 reps and movement improved to 75% WFL 25 % pain in back and back of legs  Right lateral flexion      Left lateral flexion      Right rotation      Left rotation      No indication of centralization or peripheralization c active movement noted in clinic today.  Hypomobility in movement noted.     (Blank rows = not tested)   LOWER EXTREMITY ROM:        Right 08/08/2022 Left 08/08/2022  Hip flexion      Hip extension      Hip abduction      Hip  adduction      Hip internal rotation      Hip external rotation      Knee flexion      Knee extension      Ankle dorsiflexion      Ankle plantarflexion      Ankle inversion      Ankle eversion       (Blank rows = not tested)   LOWER EXTREMITY MMT:     MMT Right 08/08/2022 Left 08/08/2022  Hip flexion 5/5 5/5  Hip extension      Hip abduction      Hip adduction      Hip internal rotation      Hip external rotation      Knee flexion 5/5 5/5  Knee extension 4+/5 4+/5  Ankle dorsiflexion 5/5 5/5  Ankle plantarflexion      Ankle inversion      Ankle eversion       (Blank rows = not tested)   LUMBAR SPECIAL TESTS:  08/08/2022 (-) slump bilateral, + thomas test bilateral for hip flexor tightness   FUNCTIONAL TESTS:  08/21/2022: Treadmill walking at self selected walking pace.  Flat surface 5 mins around 1.6 mph (complaints of mild symptoms in back of thighs within 2 mins, stayed consistent for rest of 5 mins  - rated at 4/10).  Rest 3 mins then walking at 1.6 mph at incline 5.0 level 5 mins - symptoms at 2/10 at end of 5 mins  08/08/2022 18 inch chair transfer s UE assist on several tries Lt and Rt SLS < 3 seconds c min A to prevent loss of balance   GAIT: 08/08/2022 Independent ambulation c mild forward trunk lean, reduced hip extension in swing phases bilateral, forward head posture.  Reduce gait speed initially after standing from chair.        TODAY'S TREATMENT  09/09/2022: Therex: Review of existing HEP.  Cues given for continued use for short term relief of symptoms and overall promotion of improved symptom management.  Education given on presence of stenosis and how that can impact radicular symptoms in standing/extension movement.    Manual:  Prone cPA g3 L1-L5    08/21/2022 Therex: Treadmill walking at self selected walking pace.  Flat surface 5 mins around 1.6 mph (complaints of mild symptoms in back of thighs within 2 mins, stayed consistent for rest of 5  mins  - rated at 4/10).  Rest 3 mins then walking at 1.6 mph at incline 5.0 level 5 mins - symptoms at 2/10 at end of 5 mins  Single knee to chest 15 sec  x 3 bilateral Figure 4 push away stretch for hip mobility 15 sec x 3 bilateral (slide opposite leg lower to allow movement) Supine lumbar trunk rotation 15 sec x 3 bilateral Supine bridge 2- 3 sec hold x 15  08/19/22:  TherEx:  Nustep Level 5 x 8 minutes Standing: calf/heel raises x 20 c UE support Standing hip abd x 10 each LE c UE support Standing mini squats c UE support in parallel bars x 10 Standing trunk extension c elbows against the wall x 10 holding 10 sec Prone hip extension c 2 # weights 2 x 10 each LE Prone on elbows x 1 minute Supine trunk rotation x 2 each side holding 30 seconds SLR: x 10 each LE  Manual:  Prone PA Grade 2-3 L2-L5 mobs STM to bilateral hamstrings   08/15/2022: Therex: Standing lumbar extension x 5  Prone hip extension 2-3 sec hold x 10 bilateral Supine lumbar trunk rotation stretch 15 sec x 3 bilateral Supine bridge x 10 2-3 sec hold  Sit to stand to sit 18 inch chair x 10 c slow lowering focus and reaching upright posture in each rep Incline stretch 30 sec x 3  Manual: Prone cPA G3 L2-L5       PATIENT EDUCATION:  08/15/2022 Education details: HEP update  Person educated: Patient Education method: Consulting civil engineer, Demonstration, Verbal cues, and Handouts Education comprehension: verbalized understanding, returned demonstration, and verbal cues required      HOME EXERCISE PROGRAM: Access Code: [email or web address] URL: [email or web address] Date: 08/19/2022 Prepared by: Kearney Hard  Exercises - Supine Lower Trunk Rotation  - 2-3 x daily - 7 x weekly - 1 sets - 3-5 reps - 15 hold - Hooklying Single Knee to Chest Stretch  - 2-3 x daily - 7 x weekly - 1 sets - 5 reps - 15 hold - Supine Bridge  - 1-2 x daily - 7 x weekly - 1-2 sets - 10 reps - 2 hold - Standing Lumbar Extension  with Counter  - 2-3 x daily - 7 x weekly - 1 sets - 5-10 reps - Gastroc Stretch on Wall  - 2-3 x daily - 7 x weekly - 1 sets - 5-10 reps - 1-2 hold - Prone Hip Extension  - 1 x daily - 7 x weekly - 1 sets - 10-15 reps - 2-3 hold - Sit to Stand  - 2 x daily - 7 x weekly - 1 sets - 10 reps - Heel Raises with Counter Support  - 1-2 x daily - 7 x weekly - 2 sets - 10 reps - Standing Hip Abduction with Counter Support  - 1-2 x daily - 7 x weekly - 2 sets - 10 reps - Seated Hamstring Stretch  - 1-2 x daily - 7 x weekly - 3 reps - 30 seconds hold    ASSESSMENT:   CLINICAL IMPRESSION: Pt has attended 6 visits overall with minimal long term improvements in symptoms and functional performance as noted in FOTO reassessment and subjective report.  Due to lack of change, recommendation for return to MD for follow up with continued HEP as necessary.  Hold PT at this time.      OBJECTIVE IMPAIRMENTS Abnormal gait, decreased activity tolerance, decreased balance, decreased coordination, decreased endurance, decreased mobility, difficulty walking, decreased ROM, decreased strength, hypomobility, increased fascial restrictions, impaired perceived functional ability, impaired flexibility, improper body mechanics, postural dysfunction, and pain.    ACTIVITY LIMITATIONS carrying, lifting, bending, sitting, standing, squatting, sleeping, stairs, transfers, bed mobility,  dressing, and locomotion level   PARTICIPATION LIMITATIONS: meal prep, cleaning, laundry, interpersonal relationship, community activity, occupation, and yard work   PERSONAL FACTORS CAD HTN Hypothyroidism DM CKD hyperlipidemia  are also affecting patient's functional outcome.    REHAB POTENTIAL: Fair to good   CLINICAL DECISION MAKING: Stable/uncomplicated   EVALUATION COMPLEXITY: Low     GOALS: Goals reviewed with patient? Yes   Short term PT Goals (target date for Short term goals are 3 weeks 08/29/2022) Patient will demonstrate  independent use of home exercise program to maintain progress from in clinic treatments. Goal status: MET   Long term PT goals (target dates for all long term goals are 10 weeks  10/17/2022 )   1. Patient will demonstrate/report pain at worst less than or equal to 2/10 to facilitate minimal limitation in daily activity secondary to pain symptoms. Goal status: not met 09/09/2022   2. Patient will demonstrate independent use of home exercise program to facilitate ability to maintain/progress functional gains from skilled physical therapy services. Goal status: on going - assessed 08/21/2022   3. Patient will demonstrate FOTO outcome > or = 62 % to indicate reduced disability due to condition. Goal status: not met 09/09/2022   4. Patient will demonstrate lumbar extension > or = 50 % WFL s symptoms to facilitate upright standing, walking posture at PLOF s limitation. Goal status: not met 09/09/2022   5.  Patient will demonstrate sit to stand from 18 inch chair s UE assist on 1st try.    Goal status: partially  met 09/09/2022   6.  Patient will demonstrate/report ability to sleep s restriction due to pain.   Goal status: on going - assessed 08/21/2022   7.  Patient will demonstrate ability to return to walking, golf at [facility].  a.  Goal Status: not met 09/09/2022       PLAN: PT FREQUENCY: 1-2x/week   PT DURATION: 10 weeks   PLANNED INTERVENTIONS: Therapeutic exercises, Therapeutic activity, Neuro Muscular re-education, Balance training, Gait training, Patient/Family education, Joint mobilization, Stair training, DME instructions, Dry Needling, Electrical stimulation, Cryotherapy, Moist heat, Taping, Traction Ultrasound, Ionotophoresis 51m/ml Dexamethasone, and Manual therapy.  All included unless contraindicated     PLAN FOR NEXT SESSION: Hold PT, return to MD for follow up.     MScot Jun PT, DPT, OCS, ATC 09/09/22  10:50 AM  PHYSICAL THERAPY DISCHARGE SUMMARY  Visits from Start  of Care: 6  Current functional level related to goals / functional outcomes: See note   Remaining deficits: See note   Education / Equipment: HEP  Patient goals were not met. Patient is being discharged due to lack of progress./returned back to MD.  MScot Jun PT, DPT, OCS, ATC 10/09/22  10:44 AM

## 2022-09-11 ENCOUNTER — Encounter: Payer: Medicare Other | Admitting: Rehabilitative and Restorative Service Providers"

## 2022-09-12 ENCOUNTER — Telehealth: Payer: Self-pay | Admitting: Orthopedic Surgery

## 2022-09-12 ENCOUNTER — Ambulatory Visit: Payer: Medicare Other | Admitting: Orthopedic Surgery

## 2022-09-12 DIAGNOSIS — R29818 Other symptoms and signs involving the nervous system: Secondary | ICD-10-CM | POA: Diagnosis not present

## 2022-09-12 NOTE — Progress Notes (Signed)
Orthopedic Spine Surgery Office Note  Assessment: Patient is a 88 y.o. male with low back and bilateral leg pain consistent with neurogenic claudication   Plan: -Patient has tried Physical therapy, home exercises, prednisone, Tylenol, heat packs without any lasting relief -Recommended MRI of the lumbar spine to evaluate for spinal stenosis -Patient should return to office in 3 weeks, repeat x-rays of lumbar spine at next visit: none   Patient expressed understanding of the plan and all questions were answered to the patient's satisfaction.   ___________________________________________________________________________  History: Patient is a 88 y.o. male who has been previously seen in the office for symptoms consistent with lumbar radiculopathy. Since the last visit, symptom intensity has remained the same.  He is still feeling pain in his low back that radiates into his bilateral legs.  States he feels it mostly in the posterior thighs.  It is worse when he ambulates and improves when he sits.  He has been trying physical therapy and home exercises for over 6 weeks now with no relief of symptoms.  He also gets paresthesias in his calves when he is walking that also improves when he sits down.  No other numbness or paresthesias.  No bowel or bladder incontinence.  No weakness noted since last seen.  Previous treatments: Physical therapy, home exercises, prednisone, Tylenol, heat packs  COPY OF PRIOR NOTE Patient is a 88 year old, nice man who presents today for his lumbar spine Pain location: bilateral thighs, almost no back pain Severity of pain: moderate, has been able to keep doing is activities but has to be careful or he'll aggravate the pain Mechanism of injury: insidious, no trauma Radicular symptoms: no Weakness: denies Duration of symptoms: about 3-4 weeks Symptoms of imbalance: denies Paresthesias and numbness: denies Bowel or bladder incontinence: denies Saddle anesthesia:  denies Patient can walk a few minutes before symptoms start Symptoms:             -leg cramping: yes - this is his main issue, it feels like his legs are cramping and he has pain             -leg heaviness: yes             -with improvement upon flexion to back: he has not noticed if this helps             -leg pain: yes Does not that pain is felt with walking and improves when he sits END OF COPY   Physical Exam:  General: no acute distress, appears stated age Neurologic: alert, answering questions appropriately, following commands Respiratory: unlabored breathing on room air, symmetric chest rise Psychiatric: appropriate affect, normal cadence to speech   MSK (spine):  -Strength exam      Left  Right EHL    4/5  4/5 TA    5/5  5/5 GSC    5/5  5/5 Knee extension  5/5  5/5 Hip flexion   5/5  5/5  -Sensory exam    Sensation intact to light touch in L3-S1 nerve distributions of bilateral lower extremities  -Straight leg raise: Negative -Contralateral straight leg raise: Negative -Clonus: no beats bilaterally  Imaging: XR of lumbar spine from 08/01/22 and 07/30/22 was previously independently reviewed and interpreted, showing disc height loss at L2/3 and osteophyte formation, stable grade 1 anterolisthesis at L5/S1, multilevel facet arthropathy. No acute osseous abnormality    Patient name: [name] Patient MRN: [id number] Date of visit: 09/12/22

## 2022-09-12 NOTE — Telephone Encounter (Signed)
Pt needs refill on muscle relaxer.

## 2022-09-13 MED ORDER — METHOCARBAMOL 500 MG PO TABS: 500.0000 mg | ORAL_TABLET | Freq: Three times a day (TID) | ORAL | 1 refills | Status: DC | PRN

## 2022-09-13 NOTE — Addendum Note (Signed)
Addended by: Willia Craze on: 09/13/2022 01:27 AM   Modules accepted: Orders

## 2022-10-03 ENCOUNTER — Ambulatory Visit: Payer: Medicare Other | Admitting: Orthopedic Surgery

## 2022-10-04 ENCOUNTER — Ambulatory Visit
Admission: RE | Admit: 2022-10-04 | Discharge: 2022-10-04 | Disposition: A | Discharge status: Home / Self Care | Payer: Medicare Other | Source: Ambulatory Visit | Attending: Orthopedic Surgery | Admitting: Orthopedic Surgery

## 2022-10-04 DIAGNOSIS — M48061 Spinal stenosis, lumbar region without neurogenic claudication: Secondary | ICD-10-CM | POA: Diagnosis not present

## 2022-10-04 DIAGNOSIS — R29818 Other symptoms and signs involving the nervous system: Secondary | ICD-10-CM

## 2022-10-10 ENCOUNTER — Other Ambulatory Visit: Payer: Self-pay | Admitting: Physical Medicine and Rehabilitation

## 2022-10-10 ENCOUNTER — Ambulatory Visit: Payer: Medicare Other | Admitting: Orthopedic Surgery

## 2022-10-10 DIAGNOSIS — M5416 Radiculopathy, lumbar region: Secondary | ICD-10-CM

## 2022-10-10 DIAGNOSIS — M48062 Spinal stenosis, lumbar region with neurogenic claudication: Secondary | ICD-10-CM

## 2022-10-10 NOTE — Progress Notes (Signed)
Orthopedic Spine Surgery Office Note  Assessment: Patient is a 88 y.o. male with bilateral leg pain that is worse with activity and improves with sitting or flexion of the lumbar spine consistent with neurogenic claudication.  MRI shows central stenosis at L3-4 and lateral recess stenosis at L4-5.   Plan: -Explained that initially conservative treatment is tried to see what relief can be gained with these non-surgical treatment modalities. Discussed that the conservative treatments include:  -activity modification  -physical therapy  -over the counter pain medications  -steroid injections -Patient has tried physical therapy, home exercises, activity modification, over-the-counter pain medications, oral steroids, heat packs -He is not interested in surgery at this time so discussed steroid injection as a possible treatment.  A referral was provided to him for a L3/4 ESI -If he does not get lasting relief with that, explained that he has tried all the conservative treatments and at that point surgery would be an option for treatment.  I briefly discussed L3-4 and L4-5 laminectomies -Patient should return to office in 6 weeks, repeat x-rays of lumbar spine at next visit: none   Patient expressed understanding of the plan and all questions were answered to the patient's satisfaction.   ___________________________________________________________________________  History:  Patient is a 88 y.o. male who has been previously seen in the office for symptoms consistent with lumbar stenosis. Since the last visit, symptoms have remained the same but have gotten slightly more painful.  Still feels pain going into his legs.  Feels it along the posterior aspect.  Feels it in his thighs and calves.  Describes it as a cramping sensation and his legs feel heavy.  Says he gets better if he leans forward or if he sits down.  He has not had any lasting relief with the conservative treatments he has tried to date.   Gets paresthesias into his right leg in the area of the calf.  This also gets better if he sits down.  No paresthesias or numbness in the left leg.  Can walk about a minute before symptoms start.  Previous treatments: physical therapy, home exercises, activity modification, over-the-counter pain medications, oral steroids, heat packs  Physical Exam:  General: no acute distress, appears stated age Neurologic: alert, answering questions appropriately, following commands Respiratory: unlabored breathing on room air, symmetric chest rise Psychiatric: appropriate affect, normal cadence to speech Vascular: palpable dorsalis pedis pulses bilaterally, feet warm and well perfused   MSK (spine):  -Strength exam      Left  Right EHL    4/5  5/5 TA    5/5  5/5 GSC    5/5  5/5 Knee extension  5/5  5/5 Hip flexion   5/5  5/5  -Sensory exam    Sensation intact to light touch in L3-S1 nerve distributions of bilateral lower extremities  -Achilles DTR: 1/4 on the left, 1/4 on the right -Patellar tendon DTR: 1/4 on the left, 1/4 on the right  Left hip exam: No pain through range of motion, negative Stinchfield Right hip exam: No pain through range of motion, negative Stinchfield  -Straight leg raise: negative bilaterally -Clonus: no beats bilaterally  Imaging: XR of lumbar spine from 08/01/22 and 07/30/22 was previously independently reviewed and interpreted, showing disc height loss at L2/3 and osteophyte formation, stable grade 1 anterolisthesis at L5/S1, multilevel facet arthropathy. No acute osseous abnormality   MRI of the lumbar spine from 10/04/2022 was independently reviewed and interpreted, showing central and lateral recess stenosis at L3-4, there  is a disc herniation at L3-4, lateral recess stenosis at L4-5.  Lateral recess stenosis at L2-3 that is not as significant as L3-4 or L4-5.   Patient name: [name] Patient MRN: [id number] Date of visit: 10/10/22

## 2022-10-22 ENCOUNTER — Ambulatory Visit: Payer: Self-pay

## 2022-10-22 ENCOUNTER — Ambulatory Visit: Payer: Medicare Other | Admitting: Physical Medicine and Rehabilitation

## 2022-10-22 ENCOUNTER — Telehealth: Payer: Self-pay | Admitting: Orthopedic Surgery

## 2022-10-22 VITALS — BP 162/59 | HR 56

## 2022-10-22 DIAGNOSIS — M48062 Spinal stenosis, lumbar region with neurogenic claudication: Secondary | ICD-10-CM | POA: Diagnosis not present

## 2022-10-22 DIAGNOSIS — M5416 Radiculopathy, lumbar region: Secondary | ICD-10-CM

## 2022-10-22 MED ORDER — DEXAMETHASONE SODIUM PHOSPHATE 10 MG/ML IJ SOLN
15.0000 mg | Freq: Once | INTRAMUSCULAR | Status: AC
  Administered 2022-10-22: 15 mg

## 2022-10-22 NOTE — Patient Instructions (Signed)

## 2022-10-22 NOTE — Telephone Encounter (Signed)
Patient is waiting on response from Dr. Christell Constant to advise when he can get his injection. Please Advise Patient..[phone]

## 2022-10-22 NOTE — Progress Notes (Signed)
Functional Pain Scale - descriptive words and definitions  Moderate (4)   Constantly aware of pain, can complete ADLs with modification/sleep marginally affected at times/passive distraction is of no use, but active distraction gives some relief. Moderate range order  Average Pain  can range from 4-7   +Driver, -BT, -Dye Allergies.  Lower back pain on both sides that radiates down legs, tingling to the foot, spasms in the upper legs

## 2022-10-22 NOTE — Telephone Encounter (Signed)
I have already spoke with him--he will be here @ 3:45

## 2022-10-22 NOTE — Telephone Encounter (Signed)
Pt called stating her need to speak to [place] again. Already spoke with her early. Please call pt at [phone].

## 2022-10-22 NOTE — Telephone Encounter (Signed)
I called and he was asking if the plavi would be an issue-I advised that it is fine for this type of injection

## 2022-10-30 ENCOUNTER — Telehealth: Payer: Self-pay | Admitting: Orthopedic Surgery

## 2022-10-30 MED ORDER — METHOCARBAMOL 500 MG PO TABS: 500.0000 mg | ORAL_TABLET | Freq: Four times a day (QID) | ORAL | 1 refills | Status: DC

## 2022-10-30 NOTE — Telephone Encounter (Signed)
Pt called in requesting refill on medication (methocarbamol (ROBAXIN) 500 MG tablet)... Pt stated that he is out of stated and would like the medication to be sent to [facility] [address] [place], [place] [address]... Pt requesting callback

## 2022-11-02 NOTE — Procedures (Signed)
Lumbosacral Transforaminal Epidural Steroid Injection - Sub-Pedicular Approach with Fluoroscopic Guidance  Patient: [name]      Date of Birth: [birth year] MRN: [id number] PCP: Anne Ng, NP      Visit Date: 10/22/2022   Universal Protocol:    Date/Time: 10/22/2022  Consent Given By: the patient  Position: PRONE  Additional Comments: Vital signs were monitored before and after the procedure. Patient was prepped and draped in the usual sterile fashion. The correct patient, procedure, and site was verified.   Injection Procedure Details:   Procedure diagnoses: Lumbar radiculopathy [M54.16]    Meds Administered:  Meds ordered this encounter  Medications   dexamethasone (DECADRON) injection 15 mg    Laterality: Bilateral  Location/Site: L3  Needle:5.0 in., 22 ga.  Short bevel or Quincke spinal needle  Needle Placement: Transforaminal  Findings:    -Comments: Excellent flow of contrast along the nerve, nerve root and into the epidural space.  Procedure Details: After squaring off the end-plates to get a true AP view, the C-arm was positioned so that an oblique view of the foramen as noted above was visualized. The target area is just inferior to the "nose of the scotty dog" or sub pedicular. The soft tissues overlying this structure were infiltrated with 2-3 ml. of 1% Lidocaine without Epinephrine.  The spinal needle was inserted toward the target using a "trajectory" view along the fluoroscope beam.  Under AP and lateral visualization, the needle was advanced so it did not puncture dura and was located close the 6 O'Clock position of the pedical in AP tracterory. Biplanar projections were used to confirm position. Aspiration was confirmed to be negative for CSF and/or blood. A 1-2 ml. volume of Isovue-250 was injected and flow of contrast was noted at each level. Radiographs were obtained for documentation purposes.   After attaining the desired flow of  contrast documented above, a 0.5 to 1.0 ml test dose of 0.25% Marcaine was injected into each respective transforaminal space.  The patient was observed for 90 seconds post injection.  After no sensory deficits were reported, and normal lower extremity motor function was noted,   the above injectate was administered so that equal amounts of the injectate were placed at each foramen (level) into the transforaminal epidural space.   Additional Comments:  No complications occurred Dressing: 2 x 2 sterile gauze and Band-Aid    Post-procedure details: Patient was observed during the procedure. Post-procedure instructions were reviewed.  Patient left the clinic in stable condition.

## 2022-11-02 NOTE — Progress Notes (Signed)
[name] - 88 y.o. male MRN [id number]  Date of birth: Apr 16, 1930  Office Visit Note: Visit Date: 10/22/2022 PCP: Anne Ng, NP Referred by: London Sheer, MD  Subjective: Chief Complaint  Patient presents with   Lower Back - Pain   HPI:  [name] is a 88 y.o. male who comes in today at the request of Dr. Willia Craze for planned Bilateral L3-4 Lumbar Transforaminal epidural steroid injection with fluoroscopic guidance.  The patient has failed conservative care including home exercise, medications, time and activity modification.  This injection will be diagnostic and hopefully therapeutic.  Please see requesting physician notes for further details and justification.   ROS Otherwise per HPI.  Assessment & Plan: Visit Diagnoses:    ICD-10-CM   1. Lumbar radiculopathy  M54.16 XR C-ARM NO REPORT    Epidural Steroid injection    dexamethasone (DECADRON) injection 15 mg    2. Spinal stenosis of lumbar region with neurogenic claudication  M48.062 XR C-ARM NO REPORT    Epidural Steroid injection    dexamethasone (DECADRON) injection 15 mg      Plan: No additional findings.   Meds & Orders:  Meds ordered this encounter  Medications   dexamethasone (DECADRON) injection 15 mg    Orders Placed This Encounter  Procedures   XR C-ARM NO REPORT   Epidural Steroid injection    Follow-up: Return for visit to requesting provider as needed.   Procedures: No procedures performed  Lumbosacral Transforaminal Epidural Steroid Injection - Sub-Pedicular Approach with Fluoroscopic Guidance  Patient: [name]      Date of Birth: 1930/05/21 MRN: [id number] PCP: Anne Ng, NP      Visit Date: 10/22/2022   Universal Protocol:    Date/Time: 10/22/2022  Consent Given By: the patient  Position: PRONE  Additional Comments: Vital signs were monitored before and after the procedure. Patient was prepped and draped in the usual sterile  fashion. The correct patient, procedure, and site was verified.   Injection Procedure Details:   Procedure diagnoses: Lumbar radiculopathy [M54.16]    Meds Administered:  Meds ordered this encounter  Medications   dexamethasone (DECADRON) injection 15 mg    Laterality: Bilateral  Location/Site: L3  Needle:5.0 in., 22 ga.  Short bevel or Quincke spinal needle  Needle Placement: Transforaminal  Findings:    -Comments: Excellent flow of contrast along the nerve, nerve root and into the epidural space.  Procedure Details: After squaring off the end-plates to get a true AP view, the C-arm was positioned so that an oblique view of the foramen as noted above was visualized. The target area is just inferior to the "nose of the scotty dog" or sub pedicular. The soft tissues overlying this structure were infiltrated with 2-3 ml. of 1% Lidocaine without Epinephrine.  The spinal needle was inserted toward the target using a "trajectory" view along the fluoroscope beam.  Under AP and lateral visualization, the needle was advanced so it did not puncture dura and was located close the 6 O'Clock position of the pedical in AP tracterory. Biplanar projections were used to confirm position. Aspiration was confirmed to be negative for CSF and/or blood. A 1-2 ml. volume of Isovue-250 was injected and flow of contrast was noted at each level. Radiographs were obtained for documentation purposes.   After attaining the desired flow of contrast documented above, a 0.5 to 1.0 ml test dose of 0.25% Marcaine was injected into each respective transforaminal space.  The patient was observed for 90 seconds post injection.  After no sensory deficits were reported, and normal lower extremity motor function was noted,   the above injectate was administered so that equal amounts of the injectate were placed at each foramen (level) into the transforaminal epidural space.   Additional Comments:  No complications  occurred Dressing: 2 x 2 sterile gauze and Band-Aid    Post-procedure details: Patient was observed during the procedure. Post-procedure instructions were reviewed.  Patient left the clinic in stable condition.    Clinical History: MRI LUMBAR SPINE WITHOUT CONTRAST   TECHNIQUE: Multiplanar, multisequence MR imaging of the lumbar spine was performed. No intravenous contrast was administered.   COMPARISON:  None Available.   FINDINGS: Segmentation:  Standard.   Alignment: Minimal grade 1 anterolisthesis of L3 on L4 and L4 on L5.   Vertebrae: No acute fracture, evidence of discitis, or aggressive bone lesion.   Conus medullaris and cauda equina: Conus extends to the L1 level. Conus and cauda equina appear normal.   Paraspinal and other soft tissues: No acute paraspinal abnormality.   Disc levels:   Disc spaces: Degenerative disease with disc height loss at L2-3, L3-4 and L4-5.   T12-L1: No significant disc bulge. No neural foraminal stenosis. No central canal stenosis.   L1-L2: Minimal broad-based disc bulge. No foraminal or central canal stenosis. Mild bilateral facet arthropathy.   L2-L3: Broad-based disc bulge flattening the ventral thecal sac. Mild bilateral facet arthropathy. Mild spinal stenosis. Bilateral lateral recess stenosis. Mild bilateral foraminal stenosis.   L3-L4: Broad-based disc bulge with a left paracentral disc protrusion. Moderate bilateral facet arthropathy with ligamentum flavum infolding. Severe spinal stenosis. Mild right and moderate left foraminal stenosis.   L4-L5: Broad-based disc bulge. Moderate bilateral facet arthropathy. Moderate spinal stenosis. Mild left foraminal stenosis. No right foraminal stenosis.   L5-S1: Mild broad-based disc bulge. Moderate bilateral facet arthropathy. Mild left foraminal stenosis. No right foraminal stenosis. No spinal stenosis.   IMPRESSION: 1. At L2-3 there is a broad-based disc bulge flattening  the ventral thecal sac. Mild bilateral facet arthropathy. Mild spinal stenosis. Bilateral lateral recess stenosis. Mild bilateral foraminal stenosis. 2. At L3-4 there is a broad-based disc bulge with a left paracentral disc protrusion. Moderate bilateral facet arthropathy with ligamentum flavum infolding. Severe spinal stenosis. Mild right and moderate left foraminal stenosis. 3. At L4-5 there is a broad-based disc bulge. Moderate bilateral facet arthropathy. Moderate spinal stenosis. Mild left foraminal stenosis. 4. At L5-S1 there is a mild broad-based disc bulge. Moderate bilateral facet arthropathy. Mild left foraminal stenosis. 5. No acute osseous injury of the lumbar spine.     Electronically Signed   By: Elige Ko M.D.   On: 10/06/2022 09:17     Objective:  VS:  HT:    WT:   BMI:     BP:(!) 162/59  HR:(!) 56bpm  TEMP: ( )  RESP:  Physical Exam Vitals and nursing note reviewed.  Constitutional:      General: He is not in acute distress.    Appearance: Normal appearance. He is not ill-appearing.  HENT:     Head: Normocephalic and atraumatic.     Right Ear: External ear normal.     Left Ear: External ear normal.     Nose: No congestion.  Eyes:     Extraocular Movements: Extraocular movements intact.  Cardiovascular:     Rate and Rhythm: Normal rate.     Pulses: Normal pulses.  Pulmonary:  Effort: Pulmonary effort is normal. No respiratory distress.  Abdominal:     General: There is no distension.     Palpations: Abdomen is soft.  Musculoskeletal:        General: No tenderness or signs of injury.     Cervical back: Neck supple.     Right lower leg: No edema.     Left lower leg: No edema.     Comments: Patient has good distal strength without clonus.  Skin:    Findings: No erythema or rash.  Neurological:     General: No focal deficit present.     Mental Status: He is alert and oriented to person, place, and time.     Sensory: No sensory deficit.      Motor: No weakness or abnormal muscle tone.     Coordination: Coordination normal.  Psychiatric:        Mood and Affect: Mood normal.        Behavior: Behavior normal.      Imaging: No results found.

## 2022-11-14 ENCOUNTER — Ambulatory Visit (INDEPENDENT_AMBULATORY_CARE_PROVIDER_SITE_OTHER): Payer: Medicare Other | Admitting: Orthopedic Surgery

## 2022-11-14 DIAGNOSIS — M48062 Spinal stenosis, lumbar region with neurogenic claudication: Secondary | ICD-10-CM

## 2022-11-14 NOTE — Progress Notes (Signed)
Pre-operative Scores  ODI: 8 VAS back: 0 VAS leg: 8 SF-36:  -Physical functioning: 35  -Role limitations due to physical health: 0  -Role limitations due to emotional problems: 100  -Energy/fatigue: 70  -Emotional well being: 70  -Social functioning: 87.5  -Pain: 22.5  -General health: [place], MD Orthopedic Surgeon

## 2022-11-14 NOTE — Progress Notes (Signed)
Orthopedic Spine Surgery Office Note  Assessment: Patient is a 89 y.o. male with bilateral leg pain that is worse with activity and improves with sitting or flexion of the lumbar spine consistent with neurogenic claudication.  MRI shows central stenosis with disc herniation at L3-4 and lateral recess stenosis at L4-5.    Plan: -Explained that initially conservative treatment is tried to see what relief can be gained with these non-surgical treatment modalities. Discussed that the conservative treatments include:  -activity modification  -physical therapy  -over the counter pain medications  -injections -Patient has tried physical therapy, activity modification, home exercises, activity modification, over-the-counter pain medications, oral steroids, heat packs, steroid injection -Patient has had symptoms for close to 6 months now with no relief in spite of conservative treatments, so discussed operative management as an option for him.  We discussed a L3/4 and L4/5 laminectomy.  After our discussion, patient elected to proceed with surgery -Patient will next be seen at the date of surgery   The patient has symptoms consistent with lumbar stenosis. The patient's symptoms were not getting improvement with conservative treatment so operative management was discussed in the form of L3-5 laminectomies. The risks including but not limited to iatrogenic instability, dural tear, nerve root injury, paralysis, persistent pain, infection, bleeding, heart attack, death, stroke, fracture, blindness, and need for additional procedures were discussed with the patient. The benefit of the surgery would be improvement in the patient's symptoms related to the lumbar stenosis which would be the radiating leg pain.  He is not having any back pain, but I explained that the surgery does not reliably relieve back pain just to be thorough. The alternatives to surgical management were covered with the patient and included  continued monitoring, physical therapy, over-the-counter pain medications, ambulatory aids, and activity modification. All the patient's questions were answered to his satisfaction. After this discussion, the patient expressed understanding and elected to proceed with surgical intervention.     ___________________________________________________________________________  History:  Patient is a 89 y.o. male who has been previously seen in the office for symptoms consistent with lumbar stenosis. Since the last visit, his symptoms have remained the same.  He tried a epidural steroid injection but it did not provide him with any lasting relief.  He says he may be got a couple of days of some relief.  He still feels the pain in his bilateral legs particular in the posterior aspect and in the thighs.  It is worse if he is standing or is walking.  It gets better if he sits down.  He still very active and works at the [employer]. He likes to exercise daily but has had trouble because of his leg pain.  He is also been unable to golf because of the pain.  He notes that he still is able to use a seated bike without issue.  However, he has not noticed any other activity that he can do without pain.  He is having trouble getting around the house even because it is painful just to walk that short of the distance.  Pain is sharp and also described as cramping in nature.  Denies paresthesia numbness.  Within the last couple of months, he has noticed increased urinary leakage and sometimes small amount of feces in his underwear.  He notes that he still has control over his bowel and bladder but has difficulty getting to the bathroom in time and sometimes the cramping type pain caused him to have urinary leakage.  No other recent changes in bowel or bladder habits.  No saddle anesthesia.  Physical Exam:  General: no acute distress, appears stated age Neurologic: alert, answering questions appropriately, following  commands Respiratory: unlabored breathing on room air, symmetric chest rise Psychiatric: appropriate affect, normal cadence to speech Vascular: palpable dorsalis pedis pulses bilaterally, feet warm and well perfused   MSK (spine):  -Strength exam      Left  Right EHL    4/5  5/5 TA    5/5  5/5 GSC    5/5  5/5 Knee extension  5/5  5/5 Hip flexion   5/5  5/5  -Sensory exam    Sensation intact to light touch in L2-S1 nerve distributions of bilateral lower extremities. No saddle anesthesia  -Achilles DTR: 1/4 on the left, 1/4 on the right -Patellar tendon DTR: 1/4 on the left, 1/4 on the right  Left hip exam: No pain through range of motion, negative Stinchfield, negative Faber Right hip exam: No pain through range of motion, negative Stinchfield, negative Faber  -Straight leg raise: negative bilaterally -Clonus: no beats bilaterally  Imaging: XR of lumbar spine from 08/01/22 and 07/30/22 was previously independently reviewed and interpreted, showing disc height loss at L2/3 and osteophyte formation, stable grade 1 anterolisthesis at L5/S1, multilevel facet arthropathy. No fracture or dislocation.    MRI of the lumbar spine from 10/04/2022 was independently reviewed and interpreted, showing central and lateral recess stenosis at L3-4, there is a disc herniation at L3-4, lateral recess stenosis at L4-5.  Lateral recess stenosis at L2-3 that is not as significant as L3-4 or L4-5.   Patient name: [name] Patient MRN: [id number] Date of visit: 11/14/22

## 2022-11-19 ENCOUNTER — Telehealth: Payer: Self-pay | Admitting: Nurse Practitioner

## 2022-11-19 NOTE — Telephone Encounter (Signed)
Appt sch 1/18 1:20p. Pt is a 40 min set. I blocked 2p slot to prevent any overbooking.

## 2022-11-19 NOTE — Telephone Encounter (Signed)
See note

## 2022-11-21 ENCOUNTER — Ambulatory Visit (INDEPENDENT_AMBULATORY_CARE_PROVIDER_SITE_OTHER): Payer: Medicare Other | Admitting: Nurse Practitioner

## 2022-11-21 ENCOUNTER — Ambulatory Visit (HOSPITAL_COMMUNITY)
Admission: RE | Admit: 2022-11-21 | Discharge: 2022-11-21 | Disposition: A | Discharge status: Home / Self Care | Payer: Medicare Other | Source: Ambulatory Visit | Attending: Cardiovascular Disease | Admitting: Cardiovascular Disease

## 2022-11-21 ENCOUNTER — Encounter: Payer: Self-pay | Admitting: Nurse Practitioner

## 2022-11-21 VITALS — BP 136/86 | HR 61 | Temp 98.1°F | Ht 69.0 in | Wt 169.4 lb

## 2022-11-21 DIAGNOSIS — E039 Hypothyroidism, unspecified: Secondary | ICD-10-CM | POA: Diagnosis not present

## 2022-11-21 DIAGNOSIS — E1169 Type 2 diabetes mellitus with other specified complication: Secondary | ICD-10-CM

## 2022-11-21 DIAGNOSIS — I2510 Atherosclerotic heart disease of native coronary artery without angina pectoris: Secondary | ICD-10-CM

## 2022-11-21 DIAGNOSIS — E782 Mixed hyperlipidemia: Secondary | ICD-10-CM

## 2022-11-21 DIAGNOSIS — I10 Essential (primary) hypertension: Secondary | ICD-10-CM | POA: Diagnosis not present

## 2022-11-21 DIAGNOSIS — I6523 Occlusion and stenosis of bilateral carotid arteries: Secondary | ICD-10-CM | POA: Diagnosis not present

## 2022-11-21 DIAGNOSIS — I779 Disorder of arteries and arterioles, unspecified: Secondary | ICD-10-CM | POA: Insufficient documentation

## 2022-11-21 DIAGNOSIS — R202 Paresthesia of skin: Secondary | ICD-10-CM

## 2022-11-21 DIAGNOSIS — R150 Incomplete defecation: Secondary | ICD-10-CM

## 2022-11-21 DIAGNOSIS — Z01818 Encounter for other preprocedural examination: Secondary | ICD-10-CM | POA: Diagnosis not present

## 2022-11-21 DIAGNOSIS — N1831 Chronic kidney disease, stage 3a: Secondary | ICD-10-CM | POA: Diagnosis not present

## 2022-11-21 DIAGNOSIS — E1165 Type 2 diabetes mellitus with hyperglycemia: Secondary | ICD-10-CM

## 2022-11-21 DIAGNOSIS — E785 Hyperlipidemia, unspecified: Secondary | ICD-10-CM | POA: Diagnosis not present

## 2022-11-21 DIAGNOSIS — R32 Unspecified urinary incontinence: Secondary | ICD-10-CM

## 2022-11-21 NOTE — Patient Instructions (Signed)
Go to lab Schedule appt with cardiology.

## 2022-11-22 ENCOUNTER — Other Ambulatory Visit: Payer: Self-pay | Admitting: Orthopedic Surgery

## 2022-11-22 LAB — COMPREHENSIVE METABOLIC PANEL
ALT: 19 U/L (ref 0–53)
AST: 17 U/L (ref 0–37)
Albumin: 4.7 g/dL (ref 3.5–5.2)
Alkaline Phosphatase: 60 U/L (ref 39–117)
BUN: 24 mg/dL — ABNORMAL HIGH (ref 6–23)
CO2: 27 mEq/L (ref 19–32)
Calcium: 10.3 mg/dL (ref 8.4–10.5)
Chloride: 105 mEq/L (ref 96–112)
Creatinine, Ser: 1.33 mg/dL (ref 0.40–1.50)
GFR: 47.21 mL/min — ABNORMAL LOW (ref >60.00)
Glucose, Bld: 90 mg/dL (ref 70–99)
Potassium: 4.4 mEq/L (ref 3.5–5.1)
Sodium: 143 mEq/L (ref 135–145)
Total Bilirubin: 0.4 mg/dL (ref 0.2–1.2)
Total Protein: 7 g/dL (ref 6.0–8.3)

## 2022-11-22 LAB — CBC
HCT: 45.6 % (ref 39.0–52.0)
Hemoglobin: 15.4 g/dL (ref 13.0–17.0)
MCHC: 33.8 g/dL (ref 30.0–36.0)
MCV: 96.8 fl (ref 78.0–100.0)
Platelets: 281 10*3/uL (ref 150.0–400.0)
RBC: 4.71 Mil/uL (ref 4.22–5.81)
RDW: 13.1 % (ref 11.5–15.5)
WBC: 7.8 10*3/uL (ref 4.0–10.5)

## 2022-11-22 LAB — URINALYSIS W MICROSCOPIC + REFLEX CULTURE

## 2022-11-22 LAB — HEMOGLOBIN A1C: Hgb A1c MFr Bld: 6.3 % (ref 4.6–6.5)

## 2022-11-22 LAB — B12 AND FOLATE PANEL
Folate: 12.3 ng/mL (ref >5.9)
Vitamin B-12: 228 pg/mL (ref 211–911)

## 2022-11-22 LAB — LIPID PANEL
Cholesterol: 164 mg/dL (ref 0–200)
HDL: 39.8 mg/dL (ref >39.00)
NonHDL: 124.65
Total CHOL/HDL Ratio: 4
Triglycerides: 288 mg/dL — ABNORMAL HIGH (ref 0.0–149.0)
VLDL: 57.6 mg/dL — ABNORMAL HIGH (ref 0.0–40.0)

## 2022-11-22 LAB — T4, FREE: Free T4: 0.78 ng/dL (ref 0.60–1.60)

## 2022-11-22 LAB — TSH: TSH: 2.49 u[IU]/mL (ref 0.35–5.50)

## 2022-11-22 LAB — LDL CHOLESTEROL, DIRECT: Direct LDL: 81 mg/dL

## 2022-11-22 LAB — PSA: PSA: 0.26 ng/mL (ref 0.10–4.00)

## 2022-11-23 ENCOUNTER — Encounter: Payer: Self-pay | Admitting: Nurse Practitioner

## 2022-11-23 ENCOUNTER — Other Ambulatory Visit: Payer: Self-pay | Admitting: Nurse Practitioner

## 2022-11-23 DIAGNOSIS — E039 Hypothyroidism, unspecified: Secondary | ICD-10-CM

## 2022-11-23 MED ORDER — LEVOTHYROXINE SODIUM 88 MCG PO TABS: 88.0000 ug | ORAL_TABLET | Freq: Every day | ORAL | 1 refills | Status: DC

## 2022-11-23 NOTE — Assessment & Plan Note (Addendum)
Repeat lipid panel: LDL not at goal Hx of CAD and carotid stenosis Current use of pravastatin.

## 2022-11-23 NOTE — Assessment & Plan Note (Addendum)
Pending repeat carotid US No CVA or TIA or Syncope Current use of pravastatin

## 2022-11-23 NOTE — Assessment & Plan Note (Addendum)
Under the care of Dr. Acie Fredrickson No CP or SOB or syncope BP at goal Dm controlled

## 2022-11-23 NOTE — Progress Notes (Signed)
Established Patient Visit  Patient: [name]   DOB: 02-17-1930   89 y.o. Male  MRN: [id number] Visit Date: 11/23/2022  Subjective:    Chief Complaint  Patient presents with   Office Visit    Surgical clearance     HPI Mr. Mierzwa is scheduled for L3/4 and L4/5 laminectomy by Dr. Christell Constant. He has chronic back pain with radiculopathy to bilateral LE. Also associated to LE numbness and muscle spasm. Pain and and stiffness worse when changing positions (laying to sitting or sitting to standing).  No pain in sitting, or supine position. Report falls at home due to pain and muscle spasm. Denies any injury or LOC. He also reports urinary incontinence, urinary urgency, and fecal incontinence. Ongoing for several months and getting worse with worsening back pain. Denies any saddle paresthesia. No hematuria. No diarrhea or constipation. No previous EMG MRI lumbar spine 2023: At L2-3 there is a broad-based disc bulge flattening the ventral thecal sac. Mild bilateral facet arthropathy. Mild spinal stenosis. Bilateral lateral recess stenosis. Mild bilateral foraminal stenosis. At L3-4 there is a broad-based disc bulge with a left paracentral disc protrusion. Moderate bilateral facet arthropathy with ligamentum flavum infolding. Severe spinal stenosis. Mild right and moderate left foraminal stenosis. At L4-5 there is a broad-based disc bulge. Moderate bilateral facet arthropathy. Moderate spinal stenosis. Mild left foraminal stenosis. At L5-S1 there is a mild broad-based disc bulge. Moderate bilateral facet arthropathy. Mild left foraminal stenosis. No acute osseous injury of the lumbar spine.  Hypothyroidism Repeat TSH and T4: stable Maintain levothyroxine dose Refill sent  Hypertension BP at goal with amlodipine Also followed by cardiology: Dr. Elease Hashimoto BP Readings from Last 3 Encounters:  11/21/22 136/86  10/22/22 (!) 162/59  08/01/22 (!) 161/90    Maintain med dose  DM  (diabetes mellitus) (HCC) Repeat hgbA1c at 6.3% Diet controlled  Hyperlipidemia associated with type 2 diabetes mellitus (HCC) Repeat lipid panel: LDL not at goal Hx of CAD and carotid stenosis Current use of pravastatin.  CKD (chronic kidney disease) stage 3, GFR 30-59 ml/min Repeat CMP: stable renal function  Bilateral carotid artery disease, unspecified type (HCC) Pending repeat carotid US No CVA or TIA or Syncope Current use of pravastatin  CAD (coronary artery disease) Under the care of Dr. Elease Hashimoto No CP or SOB or syncope BP at goal Dm controlled  Reviewed medical, surgical, and social history today  Medications: Outpatient Medications Prior to Visit  Medication Sig   amLODipine (NORVASC) 5 MG tablet Take 1 tablet by mouth once daily   clopidogrel (PLAVIX) 75 MG tablet Take 1 tablet by mouth once daily   fish oil-omega-3 fatty acids 1000 MG capsule Take 2 g by mouth daily.   glucosamine-chondroitin 500-400 MG tablet Take 1 tablet by mouth daily in the afternoon.   pravastatin (PRAVACHOL) 40 MG tablet Take 1 tablet by mouth once daily   [DISCONTINUED] levothyroxine (SYNTHROID) 88 MCG tablet TAKE 1 TABLET BY MOUTH ONCE DAILY BEFORE BREAKFAST   [DISCONTINUED] methocarbamol (ROBAXIN) 500 MG tablet Take 1 tablet (500 mg total) by mouth 4 (four) times daily.   [DISCONTINUED] aspirin 81 MG tablet Take 81 mg by mouth daily. (Patient not taking: Reported on 07/22/2022)   [DISCONTINUED] famotidine (PEPCID) 20 MG tablet Take 1 tablet (20 mg total) by mouth 2 (two) times daily. (Patient not taking: Reported on 11/20/2021)   No facility-administered medications prior to visit.   Reviewed  past medical and social history.   ROS per HPI above  Last metabolic panel Lab Results  Component Value Date   GLUCOSE 90 11/21/2022   NA 143 11/21/2022   K 4.4 11/21/2022   CL 105 11/21/2022   CO2 27 11/21/2022   BUN 24 (H) 11/21/2022   CREATININE 1.33 11/21/2022   GFRNONAA 52 (L)  11/28/2020   CALCIUM 10.3 11/21/2022   PROT 7.0 11/21/2022   ALBUMIN 4.7 11/21/2022   BILITOT 0.4 11/21/2022   ALKPHOS 60 11/21/2022   AST 17 11/21/2022   ALT 19 11/21/2022   Last lipids Lab Results  Component Value Date   CHOL 164 11/21/2022   HDL 39.80 11/21/2022   LDLCALC 78 11/20/2021   LDLDIRECT 81.0 11/21/2022   TRIG 288.0 (H) 11/21/2022   CHOLHDL 4 11/21/2022   Last hemoglobin A1c Lab Results  Component Value Date   HGBA1C 6.3 11/21/2022   Last thyroid functions Lab Results  Component Value Date   TSH 2.49 11/21/2022   T4TOTAL 6.9 12/05/2020      Objective:  BP 136/86 (BP Location: Right Arm, Patient Position: Sitting, Cuff Size: Normal)   Pulse 61   Temp 98.1 F (36.7 C) (Temporal)   Ht 5\' 9"  (1.753 m)   Wt 169 lb 6.4 oz (76.8 kg)   SpO2 98%   BMI 25.02 kg/m     ECG: NSR with RBBB, no change compared to previous ECG 2023 and 2022.  Physical Exam Constitutional:      General: He is not in acute distress. Cardiovascular:     Rate and Rhythm: Normal rate and regular rhythm.     Pulses: Normal pulses.     Heart sounds: Murmur heard.  Pulmonary:     Effort: Pulmonary effort is normal.     Breath sounds: Normal breath sounds.  Abdominal:     General: There is no distension.     Tenderness: There is no abdominal tenderness. There is no guarding.  Genitourinary:    Rectum: No mass, tenderness, anal fissure or external hemorrhoid. Normal anal tone.  Musculoskeletal:     Right lower leg: No edema.     Left lower leg: No edema.  Skin:    General: Skin is warm and dry.  Neurological:     Mental Status: He is alert and oriented to person, place, and time.  Psychiatric:        Mood and Affect: Mood normal.        Behavior: Behavior normal.        Thought Content: Thought content normal.     Results for orders placed or performed in visit on 11/21/22  Hemoglobin A1c  Result Value Ref Range   Hgb A1c MFr Bld 6.3 4.6 - 6.5 %  Comprehensive metabolic  panel  Result Value Ref Range   Sodium 143 135 - 145 mEq/L   Potassium 4.4 3.5 - 5.1 mEq/L   Chloride 105 96 - 112 mEq/L   CO2 27 19 - 32 mEq/L   Glucose, Bld 90 70 - 99 mg/dL   BUN 24 (H) 6 - 23 mg/dL   Creatinine, Ser 1.33 0.40 - 1.50 mg/dL   Total Bilirubin 0.4 0.2 - 1.2 mg/dL   Alkaline Phosphatase 60 39 - 117 U/L   AST 17 0 - 37 U/L   ALT 19 0 - 53 U/L   Total Protein 7.0 6.0 - 8.3 g/dL   Albumin 4.7 3.5 - 5.2 g/dL   GFR 47.21 (L) >60.00  mL/min   Calcium 10.3 8.4 - 10.5 mg/dL  CBC  Result Value Ref Range   WBC 7.8 4.0 - 10.5 K/uL   RBC 4.71 4.22 - 5.81 Mil/uL   Platelets 281.0 150.0 - 400.0 K/uL   Hemoglobin 15.4 13.0 - 17.0 g/dL   HCT 45.6 39.0 - 52.0 %   MCV 96.8 78.0 - 100.0 fl   MCHC 33.8 30.0 - 36.0 g/dL   RDW 13.1 11.5 - 15.5 %  TSH  Result Value Ref Range   TSH 2.49 0.35 - 5.50 uIU/mL  Lipid panel  Result Value Ref Range   Cholesterol 164 0 - 200 mg/dL   Triglycerides 288.0 (H) 0.0 - 149.0 mg/dL   HDL 39.80 >39.00 mg/dL   VLDL 57.6 (H) 0.0 - 40.0 mg/dL   Total CHOL/HDL Ratio 4    NonHDL 124.65   T4, free  Result Value Ref Range   Free T4 0.78 0.60 - 1.60 ng/dL  PSA  Result Value Ref Range   PSA 0.26 0.10 - 4.00 ng/mL  Urinalysis w microscopic + reflex cultur   Specimen: Blood  Result Value Ref Range   Color, Urine CANCELED   B12 and Folate Panel  Result Value Ref Range   Vitamin B-12 228 211 - 911 pg/mL   Folate 12.3 >5.9 ng/mL  LDL cholesterol, direct  Result Value Ref Range   Direct LDL 81.0 mg/dL      Assessment & Plan:    Problem List Items Addressed This Visit       Cardiovascular and Mediastinum   Bilateral carotid artery disease, unspecified type ([place])    Pending repeat carotid US No CVA or TIA or Syncope Current use of pravastatin      CAD (coronary artery disease)    Under the care of Dr. Acie Fredrickson No CP or SOB or syncope BP at goal Dm controlled      Hypertension    BP at goal with amlodipine Also followed by cardiology:  Dr. Acie Fredrickson BP Readings from Last 3 Encounters:  11/21/22 136/86  10/22/22 (!) 162/59  08/01/22 (!) 161/90    Maintain med dose      Relevant Orders   Comprehensive metabolic panel (Completed)     Endocrine   DM (diabetes mellitus) ([place])    Repeat hgbA1c at 6.3% Diet controlled      Relevant Orders   Hemoglobin A1c (Completed)   Comprehensive metabolic panel (Completed)   Hyperlipidemia associated with type 2 diabetes mellitus (HCC)    Repeat lipid panel: LDL not at goal Hx of CAD and carotid stenosis Current use of pravastatin.      Relevant Orders   Lipid panel (Completed)   Hypothyroidism    Repeat TSH and T4: stable Maintain levothyroxine dose Refill sent      Relevant Orders   TSH (Completed)   T4, free (Completed)     Genitourinary   CKD (chronic kidney disease) stage 3, GFR 30-59 ml/min (HCC)    Repeat CMP: stable renal function      Relevant Orders   Comprehensive metabolic panel (Completed)   Other Visit Diagnoses     Preoperative clearance    -  Primary   Relevant Orders   CBC (Completed)   EKG 12-Lead (Completed)   Urinary incontinence, unspecified type       Relevant Orders   PSA (Completed)   Urinalysis w microscopic + reflex cultur (Completed)   Incomplete defecation       Paresthesia  Relevant Orders   B12 and Folate Panel (Completed)      Return in about 3 months (around 02/20/2023) for DM, HTN, hyperlipidemia (fasting), Hypothyroidism.     Alysia Penna, NP

## 2022-11-23 NOTE — Assessment & Plan Note (Signed)
Repeat TSH and T4: stable Maintain levothyroxine dose Refill sent 

## 2022-11-23 NOTE — Assessment & Plan Note (Signed)
Repeat hgbA1c at 6.3% Diet controlled

## 2022-11-23 NOTE — Assessment & Plan Note (Signed)
Repeat CMP: stable renal function

## 2022-11-23 NOTE — Assessment & Plan Note (Signed)
BP at goal with amlodipine Also followed by cardiology: Dr. Acie Fredrickson BP Readings from Last 3 Encounters:  11/21/22 136/86  10/22/22 (!) 162/59  08/01/22 (!) 161/90    Maintain med dose

## 2022-11-25 ENCOUNTER — Other Ambulatory Visit: Payer: Self-pay | Admitting: Radiology

## 2022-11-26 ENCOUNTER — Telehealth: Payer: Self-pay | Admitting: Cardiovascular Disease

## 2022-11-26 DIAGNOSIS — I6523 Occlusion and stenosis of bilateral carotid arteries: Secondary | ICD-10-CM

## 2022-11-26 NOTE — Telephone Encounter (Signed)
Called and spoke with patient who verbalized understanding of carotid ultrasound. Repeat scan ordered to be done in 1 year. Pt scheduled for annual OV w/[name] on 11/28/22.

## 2022-11-26 NOTE — Telephone Encounter (Signed)
-----  Message from Thayer Headings, MD sent at 11/21/2022  5:59 PM EST ----- Right carotid-moderate plaque.  The external right carotid appears to be greater than 50% stenosis.  Left internal carotid artery has moderate plaque.  Follow-up carotid duplex scan in 12 months.

## 2022-11-27 ENCOUNTER — Telehealth: Payer: Self-pay | Admitting: *Deleted

## 2022-11-27 ENCOUNTER — Encounter: Payer: Self-pay | Admitting: Cardiovascular Disease

## 2022-11-27 NOTE — Telephone Encounter (Signed)
   Pre-operative Risk Assessment    Patient Name: [name]  DOB: 1930-04-21 MRN: [id number]      Request for Surgical Clearance    Procedure:   L3-4, L4-5 LAMINECTOMY AND FORAMINOTOMIES   Date of Surgery:  Clearance TBD                                 Surgeon:  DR. Ileene Rubens Surgeon's Group or Practice Name:  [facility] Phone number:  [phone] Fax number:  [phone] ATTN: APRIL    Type of Clearance Requested:   - Medical  - Pharmacy:  Hold Aspirin and Clopidogrel (Plavix) : REQUEST TO HOLD ASA x  7 DAYS PRIOR AND TO HOLD PLAVIX x 5 DAYS PRIOR TO SURGERY   Type of Anesthesia:  General    Additional requests/questions:    [name]   11/27/2022, 2:54 PM

## 2022-11-27 NOTE — Telephone Encounter (Signed)
   Name: [name]  DOB: 09-27-1930  MRN: [id number]  Primary Cardiologist: Mertie Moores, MD  Chart reviewed as part of pre-operative protocol coverage. The patient has an upcoming visit scheduled with Dr. Acie Fredrickson on 11/28/2022 at which time clearance can be addressed in case there are any issues that would impact surgical recommendations.  L3*4, L4-5 Laminectomy and foraminotomy is not scheduled until TBD as below. I added preop FYI to appointment note so that provider is aware to address at time of outpatient visit.  Per office protocol the cardiology provider should forward their finalized clearance decision and recommendations regarding antiplatelet therapy to the requesting party below.    I will route this message as FYI to requesting party and remove this message from the preop box as separate preop APP input not needed at this time.   Please call with any questions.  Lenna Sciara, NP  11/27/2022, 4:32 PM

## 2022-11-27 NOTE — Progress Notes (Unsigned)
[name] Date of Birth  Jan 08, 1930  [address]    [place], [place]  [address] [phone]  Problem List: 1. CAD - PCI of RCA March, 2012 2. RBBB 3. Hypothyroidism 4. Carotid bruits 5. hyperlipidemia   [name] is an elderly gentleman with a history of coronary artery disease. He status post PTCA and stenting of the mid-right coronary artery in April of this year. He's done well since I last saw him. He's not had any episodes of chest pain or shortness of breath. He's been tolerating his medications without any difficulty.  No angina. No dyspnea.  Exercising regularly - golf 1 day a week. Works out in [employer] 3 days a week ( including spin class)  Dec. 5, 2016:  Was last seen in 2013. Recently saw Horald Pollen. 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 Has had some stomach / lower chest pain.  Sharp pain . Has not carried any NTG in years.  Still working out regularly - without any angina .   Works out for 2 hours. Still playing golf several days a week.   Dec. 5, 2017:  Doing well. Played 27 holes of golf yesterday. Goes to the gym 3 days a week - spin class.   Jan. 16, 2019:  [name] is seen back today for follow-up of his coronary artery disease and carotid artery disease.  He had a carotid duplex scan in December, 2018 which reveals moderate bilateral carotid disease.  He will be scheduled to repeat this in 1 year.  Fasting labs recently reveal normal electrolytes and a stable creatinine at 1.29.  His liver enzymes are normal.  Total cholesterol is 126.  His HDL is stable at 30.  LDL is 67.  Triglycerides are 146.  Still exercising at [employer] 3 days a week.   Plays golf regularly  No CP or dyspnea.   Jan. 16 , 2020  [name] is seen today for a 1 year office visit Has a hx of CAD  With PCI of his RCA in March, 2012 Has moderate bilateral carotid artery disease  No syncope Has occasional episodes of upper abdominal /  lower chest pain He thinks its indigestion - is relieved with antiacids He exercises on a regular basis and these pains are not brought on by exercise. Lasts for a few seconds  Goes to [employer] regularly   Jan. 20, 2021:  [name] is seen today for follow up of his CAD.   S/p PCI of his RCA in March , 2012. Carotid duplex scan in Dec. 2020 showed moderate R carotid disease and mild L carotid stenosis.  No CP, no dyspnea.  No syncope or presyncope  Jan. 21, 2022:  [name] is seen for follow up of his CAD  Still working out at the [employer] .  No CP , no dyspnea   November 20, 2021: [name] is seen today for follow-up visit.  He has a history of coronary artery disease. His wife of 36 years passed away several months ago . No cp,  no dyspnea,  still working out at the gym   November 28, 2022: [name] is seen today for follow-up visit for his coronary artery disease.  He remains very active.  Current Outpatient Medications on File Prior to Visit  Medication Sig Dispense Refill   amLODipine (NORVASC) 5 MG tablet Take 1 tablet by mouth once daily 90 tablet 3   clopidogrel (PLAVIX)  75 MG tablet Take 1 tablet by mouth once daily 90 tablet 3   fish oil-omega-3 fatty acids 1000 MG capsule Take 2 g by mouth daily.     glucosamine-chondroitin 500-400 MG tablet Take 1 tablet by mouth daily in the afternoon.     levothyroxine (SYNTHROID) 88 MCG tablet Take 1 tablet (88 mcg total) by mouth daily before breakfast. 90 tablet 1   methocarbamol (ROBAXIN) 500 MG tablet TAKE 1 TABLET BY MOUTH EVERY 8 HOURS AS NEEDED FOR MUSCLE SPASM 45 tablet 0   pravastatin (PRAVACHOL) 40 MG tablet Take 1 tablet by mouth once daily 90 tablet 3   No current facility-administered medications on file prior to visit.    No Known Allergies  Past Medical History:  Diagnosis Date   Coronary artery disease    PCI, STent to RCA   Hyperlipidemia    Rash    Thyroid disease    hypothyroidism    Past Surgical History:   Procedure Laterality Date   APPENDECTOMY     CARDIAC CATHETERIZATION     2007 occluded ramus inter. , PCI to RCA   CARPAL TUNNEL RELEASE      Social History   Tobacco Use  Smoking Status Former   Types: Cigarettes   Quit date: 11/04/1960   Years since quitting: 62.1  Smokeless Tobacco Never    Social History   Substance and Sexual Activity  Alcohol Use No    Family History  Problem Relation Age of Onset   Hypertension Mother    Hypertension Father    Allergic rhinitis Neg Hx    Angioedema Neg Hx    Asthma Neg Hx    Eczema Neg Hx    Immunodeficiency Neg Hx    Urticaria Neg Hx     Reviw of Systems:  Reviewed in the HPI.  All other systems are negative.  Physical Exam: There were no vitals taken for this visit.  No BP recorded.  {Refresh Note OR Click here to enter BP  :1}***    GEN:  Well nourished, well developed in no acute distress HEENT: Normal NECK: No JVD; No carotid bruits LYMPHATICS: No lymphadenopathy CARDIAC: RRR ***, no murmurs, rubs, gallops RESPIRATORY:  Clear to auscultation without rales, wheezing or rhonchi  ABDOMEN: Soft, non-tender, non-distended MUSCULOSKELETAL:  No edema; No deformity  SKIN: Warm and dry NEUROLOGIC:  Alert and oriented x 3     ECG:     Assessment / Plan:   1. CAD -         2. RBBB -   stable    3. Aortic stenosis:       4. Carotid bruits -       5. Hyperlipidemia-    Lipids are stable,   cont current meds.  Check labs today   Mertie Moores, MD  11/27/2022 5:55 PM    [address] [place],  [place], [place]  [address] Pager [phone] Phone: [phone]; Fax: [phone]

## 2022-11-28 ENCOUNTER — Ambulatory Visit: Payer: Medicare Other | Attending: Cardiovascular Disease | Admitting: Cardiovascular Disease

## 2022-11-28 ENCOUNTER — Encounter: Payer: Self-pay | Admitting: Cardiovascular Disease

## 2022-11-28 VITALS — BP 145/65 | HR 64 | Ht 69.0 in | Wt 169.0 lb

## 2022-11-28 DIAGNOSIS — Z79899 Other long term (current) drug therapy: Secondary | ICD-10-CM | POA: Diagnosis not present

## 2022-11-28 MED ORDER — LOSARTAN POTASSIUM 50 MG PO TABS: 50.0000 mg | ORAL_TABLET | Freq: Every day | ORAL | 3 refills | Status: DC

## 2022-11-28 NOTE — Patient Instructions (Addendum)
Medication Instructions:  Your physician has recommended you make the following change in your medication:   1) START Losartan 50mg  once daily  You may hold your clopidogrel (Plavix) 5-7 days prior to your surgery.  *If you need a refill on your cardiac medications before your next appointment, please call your pharmacy*  Lab Work: In 3 weeks: BMP If you have labs (blood work) drawn today and your tests are completely normal, you will receive your results only by: [place] (if you have MyChart) OR A paper copy in the mail If you have any lab test that is abnormal or we need to change your treatment, we will call you to review the results.  Testing/Procedures: None  Follow-Up: At [facility], you and your health needs are our priority.  As part of our continuing mission to provide you with exceptional heart care, we have created designated Provider Care Teams.  These Care Teams include your primary Cardiologist (physician) and Advanced Practice Providers (APPs -  Physician Assistants and Nurse Practitioners) who all work together to provide you with the care you need, when you need it.  Your next appointment:   3 month(s)  Provider:   Mertie Moores, MD

## 2022-11-29 ENCOUNTER — Ambulatory Visit (INDEPENDENT_AMBULATORY_CARE_PROVIDER_SITE_OTHER): Payer: Medicare Other

## 2022-11-29 VITALS — BP 124/60 | HR 70 | Temp 97.6°F | Ht 68.5 in | Wt 168.4 lb

## 2022-11-29 DIAGNOSIS — Z0000 Encounter for general adult medical examination without abnormal findings: Secondary | ICD-10-CM

## 2022-11-29 NOTE — Patient Instructions (Signed)
[name] , Thank you for taking time to come for your Medicare Wellness Visit. I appreciate your ongoing commitment to your health goals. Please review the following plan we discussed and let me know if I can assist you in the future.   These are the goals we discussed:  Goals      Patient Stated     Maintain current level of activity     Patient Stated     11/29/2022, get through surgery        This is a list of the screening recommended for you and due dates:  Health Maintenance  Topic Date Due   Complete foot exam   Never done   COVID-19 Vaccine (3 - Pfizer risk series) 12/07/2022*   Zoster (Shingles) Vaccine (1 of 2) 02/20/2023*   Pneumonia Vaccine (2 - PPSV23 or PCV20) 11/22/2023*   Eye exam for diabetics  04/04/2023   Hemoglobin A1C  05/22/2023   Medicare Annual Wellness Visit  11/30/2023   DTaP/Tdap/Td vaccine (2 - Td or Tdap) 02/23/2031   Flu Shot  Completed   HPV Vaccine  Aged Out  *Topic was postponed. The date shown is not the original due date.    Advanced directives: Please bring a copy of your POA (Power of Attorney) and/or Living Will to your next appointment.   Conditions/risks identified: none  Next appointment: Follow up in one year for your annual wellness visit.   Preventive Care 87 Years and Older, Male  Preventive care refers to lifestyle choices and visits with your health care provider that can promote health and wellness. What does preventive care include? A yearly physical exam. This is also called an annual well check. Dental exams once or twice a year. Routine eye exams. Ask your health care provider how often you should have your eyes checked. Personal lifestyle choices, including: Daily care of your teeth and gums. Regular physical activity. Eating a healthy diet. Avoiding tobacco and drug use. Limiting alcohol use. Practicing safe sex. Taking low doses of aspirin every day. Taking vitamin and mineral supplements as recommended by your  health care provider. What happens during an annual well check? The services and screenings done by your health care provider during your annual well check will depend on your age, overall health, lifestyle risk factors, and family history of disease. Counseling  Your health care provider may ask you questions about your: Alcohol use. Tobacco use. Drug use. Emotional well-being. Home and relationship well-being. Sexual activity. Eating habits. History of falls. Memory and ability to understand (cognition). Work and work Statistician. Screening  You may have the following tests or measurements: Height, weight, and BMI. Blood pressure. Lipid and cholesterol levels. These may be checked every 5 years, or more frequently if you are over 46 years old. Skin check. Lung cancer screening. You may have this screening every year starting at age 41 if you have a 30-pack-year history of smoking and currently smoke or have quit within the past 15 years. Fecal occult blood test (FOBT) of the stool. You may have this test every year starting at age 43. Flexible sigmoidoscopy or colonoscopy. You may have a sigmoidoscopy every 5 years or a colonoscopy every 10 years starting at age 43. Prostate cancer screening. Recommendations will vary depending on your family history and other risks. Hepatitis C blood test. Hepatitis B blood test. Sexually transmitted disease (STD) testing. Diabetes screening. This is done by checking your blood sugar (glucose) after you have not eaten for a  while (fasting). You may have this done every 1-3 years. Abdominal aortic aneurysm (AAA) screening. You may need this if you are a current or former smoker. Osteoporosis. You may be screened starting at age 87 if you are at high risk. Talk with your health care provider about your test results, treatment options, and if necessary, the need for more tests. Vaccines  Your health care provider may recommend certain vaccines, such  as: Influenza vaccine. This is recommended every year. Tetanus, diphtheria, and acellular pertussis (Tdap, Td) vaccine. You may need a Td booster every 10 years. Zoster vaccine. You may need this after age 87. Pneumococcal 13-valent conjugate (PCV13) vaccine. One dose is recommended after age 87. Pneumococcal polysaccharide (PPSV23) vaccine. One dose is recommended after age 87. Talk to your health care provider about which screenings and vaccines you need and how often you need them. This information is not intended to replace advice given to you by your health care provider. Make sure you discuss any questions you have with your health care provider. Document Released: 11/17/2015 Document Revised: 07/10/2016 Document Reviewed: 08/22/2015 Elsevier Interactive Patient Education  2017 [place] Prevention in the Home Falls can cause injuries. They can happen to people of all ages. There are many things you can do to make your home safe and to help prevent falls. What can I do on the outside of my home? Regularly fix the edges of walkways and driveways and fix any cracks. Remove anything that might make you trip as you walk through a door, such as a raised step or threshold. Trim any bushes or trees on the path to your home. Use bright outdoor lighting. Clear any walking paths of anything that might make someone trip, such as rocks or tools. Regularly check to see if handrails are loose or broken. Make sure that both sides of any steps have handrails. Any raised decks and porches should have guardrails on the edges. Have any leaves, snow, or ice cleared regularly. Use sand or salt on walking paths during winter. Clean up any spills in your garage right away. This includes oil or grease spills. What can I do in the bathroom? Use night lights. Install grab bars by the toilet and in the tub and shower. Do not use towel bars as grab bars. Use non-skid mats or decals in the tub or  shower. If you need to sit down in the shower, use a plastic, non-slip stool. Keep the floor dry. Clean up any water that spills on the floor as soon as it happens. Remove soap buildup in the tub or shower regularly. Attach bath mats securely with double-sided non-slip rug tape. Do not have throw rugs and other things on the floor that can make you trip. What can I do in the bedroom? Use night lights. Make sure that you have a light by your bed that is easy to reach. Do not use any sheets or blankets that are too big for your bed. They should not hang down onto the floor. Have a firm chair that has side arms. You can use this for support while you get dressed. Do not have throw rugs and other things on the floor that can make you trip. What can I do in the kitchen? Clean up any spills right away. Avoid walking on wet floors. Keep items that you use a lot in easy-to-reach places. If you need to reach something above you, use a strong step stool that has a grab  bar. Keep electrical cords out of the way. Do not use floor polish or wax that makes floors slippery. If you must use wax, use non-skid floor wax. Do not have throw rugs and other things on the floor that can make you trip. What can I do with my stairs? Do not leave any items on the stairs. Make sure that there are handrails on both sides of the stairs and use them. Fix handrails that are broken or loose. Make sure that handrails are as long as the stairways. Check any carpeting to make sure that it is firmly attached to the stairs. Fix any carpet that is loose or worn. Avoid having throw rugs at the top or bottom of the stairs. If you do have throw rugs, attach them to the floor with carpet tape. Make sure that you have a light switch at the top of the stairs and the bottom of the stairs. If you do not have them, ask someone to add them for you. What else can I do to help prevent falls? Wear shoes that: Do not have high heels. Have  rubber bottoms. Are comfortable and fit you well. Are closed at the toe. Do not wear sandals. If you use a stepladder: Make sure that it is fully opened. Do not climb a closed stepladder. Make sure that both sides of the stepladder are locked into place. Ask someone to hold it for you, if possible. Clearly mark and make sure that you can see: Any grab bars or handrails. First and last steps. Where the edge of each step is. Use tools that help you move around (mobility aids) if they are needed. These include: Canes. Walkers. Scooters. Crutches. Turn on the lights when you go into a dark area. Replace any light bulbs as soon as they burn out. Set up your furniture so you have a clear path. Avoid moving your furniture around. If any of your floors are uneven, fix them. If there are any pets around you, be aware of where they are. Review your medicines with your doctor. Some medicines can make you feel dizzy. This can increase your chance of falling. Ask your doctor what other things that you can do to help prevent falls. This information is not intended to replace advice given to you by your health care provider. Make sure you discuss any questions you have with your health care provider. Document Released: 08/17/2009 Document Revised: 03/28/2016 Document Reviewed: 11/25/2014 Elsevier Interactive Patient Education  2017 [employer].

## 2022-11-29 NOTE — Progress Notes (Signed)
Subjective:   [name] is a 89 y.o. male who presents for Medicare Annual/Subsequent preventive examination.  Review of Systems     Cardiac Risk Factors include: advanced age (>39men, >90 women);dyslipidemia;hypertension;male gender     Objective:    Today's Vitals   11/29/22 1531  BP: 124/60  Pulse: 70  Temp: 97.6 F (36.4 C)  TempSrc: Oral  SpO2: 98%  Weight: 168 lb 6.4 oz (76.4 kg)  Height: 5' 8.5" (1.74 m)   Body mass index is 25.23 kg/m.     11/29/2022    3:40 PM 08/08/2022   10:27 AM 11/27/2021    1:34 PM 08/01/2020    8:22 AM  Advanced Directives  Does Patient Have a Medical Advance Directive? Yes Yes Yes Yes  Type of Estate agent of [place];Living will Living will Healthcare Power of [place];Living will Healthcare Power of [place];Living will  Does patient want to make changes to medical advance directive?  No - Patient declined    Copy of Healthcare Power of Attorney in Chart? No - copy requested  No - copy requested No - copy requested    Current Medications (verified) Outpatient Encounter Medications as of 11/29/2022  Medication Sig   amLODipine (NORVASC) 5 MG tablet Take 1 tablet by mouth once daily   clopidogrel (PLAVIX) 75 MG tablet Take 1 tablet by mouth once daily   fish oil-omega-3 fatty acids 1000 MG capsule Take 2 g by mouth daily.   glucosamine-chondroitin 500-400 MG tablet Take 1 tablet by mouth daily in the afternoon.   levothyroxine (SYNTHROID) 88 MCG tablet Take 1 tablet (88 mcg total) by mouth daily before breakfast.   losartan (COZAAR) 50 MG tablet Take 1 tablet (50 mg total) by mouth daily.   methocarbamol (ROBAXIN) 500 MG tablet TAKE 1 TABLET BY MOUTH EVERY 8 HOURS AS NEEDED FOR MUSCLE SPASM   pravastatin (PRAVACHOL) 40 MG tablet Take 1 tablet by mouth once daily   No facility-administered encounter medications on file as of 11/29/2022.    Allergies (verified) Patient has no known allergies.    History: Past Medical History:  Diagnosis Date   Coronary artery disease    PCI, STent to RCA   Hyperlipidemia    Rash    Thyroid disease    hypothyroidism   Past Surgical History:  Procedure Laterality Date   APPENDECTOMY     CARDIAC CATHETERIZATION     2007 occluded ramus inter. , PCI to RCA   CARPAL TUNNEL RELEASE     Family History  Problem Relation Age of Onset   Hypertension Mother    Hypertension Father    Allergic rhinitis Neg Hx    Angioedema Neg Hx    Asthma Neg Hx    Eczema Neg Hx    Immunodeficiency Neg Hx    Urticaria Neg Hx    Social History   Socioeconomic History   Marital status: Married    Spouse name: Not on file   Number of children: Not on file   Years of education: Not on file   Highest education level: Not on file  Occupational History   Occupation: reired  Tobacco Use   Smoking status: Former    Types: Cigarettes    Quit date: 11/04/1960    Years since quitting: 62.1   Smokeless tobacco: Never  Vaping Use   Vaping Use: Never used  Substance and Sexual Activity   Alcohol use: No   Drug use: No   Sexual activity:  Not on file  Other Topics Concern   Not on file  Social History Narrative   Not on file   Social Determinants of Health   Financial Resource Strain: Low Risk  (11/29/2022)   Overall Financial Resource Strain (CARDIA)    Difficulty of Paying Living Expenses: Not hard at all  Food Insecurity: No Food Insecurity (11/29/2022)   Hunger Vital Sign    Worried About Running Out of Food in the Last Year: Never true    Ran Out of Food in the Last Year: Never true  Transportation Needs: No Transportation Needs (11/29/2022)   PRAPARE - Hydrologist (Medical): No    Lack of Transportation (Non-Medical): No  Physical Activity: Sufficiently Active (11/29/2022)   Exercise Vital Sign    Days of Exercise per Week: 3 days    Minutes of Exercise per Session: 60 min  Stress: No Stress Concern Present  (11/29/2022)   [place]    Feeling of Stress : Not at all  Social Connections: Moderately Integrated (11/27/2021)   Social Connection and Isolation Panel [NHANES]    Frequency of Communication with Friends and Family: Three times a week    Frequency of Social Gatherings with Friends and Family: Three times a week    Attends Religious Services: More than 4 times per year    Active Member of Clubs or Organizations: Yes    Attends Archivist Meetings: More than 4 times per year    Marital Status: Widowed    Tobacco Counseling Counseling given: Not Answered   Clinical Intake:  Pre-visit preparation completed: Yes  Pain : No/denies pain     Nutritional Status: BMI 25 -29 Overweight Nutritional Risks: None Diabetes: Yes  How often do you need to have someone help you when you read instructions, pamphlets, or other written materials from your doctor or pharmacy?: 1 - Never  Diabetic? Yes Nutrition Risk Assessment:  Has the patient had any N/V/D within the last 2 months?  No  Does the patient have any non-healing wounds?  No  Has the patient had any unintentional weight loss or weight gain?  No   Diabetes:  Is the patient diabetic?  Yes  If diabetic, was a CBG obtained today?  No  Did the patient bring in their glucometer from home?  No  How often do you monitor your CBG's? Does not.   Financial Strains and Diabetes Management:  Are you having any financial strains with the device, your supplies or your medication? No .  Does the patient want to be seen by Chronic Care Management for management of their diabetes?  No  Would the patient like to be referred to a Nutritionist or for Diabetic Management?  No   Diabetic Exams:  Diabetic Eye Exam: Completed 04/03/2022 Diabetic Foot Exam: Overdue, Pt has been advised about the importance in completing this exam. Pt is scheduled for diabetic foot exam on  next appointment.   Interpreter Needed?: No  Information entered by :: NAllen LPN   Activities of Daily Living    11/29/2022    3:42 PM  In your present state of health, do you have any difficulty performing the following activities:  Hearing? 0  Vision? 0  Difficulty concentrating or making decisions? 0  Walking or climbing stairs? 1  Dressing or bathing? 0  Doing errands, shopping? 0  Preparing Food and eating ? N  Using the Toilet? N  In the past six months, have you accidently leaked urine? Y  Do you have problems with loss of bowel control? N  Managing your Medications? N  Managing your Finances? N  Housekeeping or managing your Housekeeping? N    Patient Care Team: Nche, Charlene Brooke, NP as PCP - General (Internal Medicine) [name], Wonda Cheng, MD as PCP - Cardiology (Cardiology)  Indicate any recent Medical Services you may have received from other than [facility] providers in the past year (date may be approximate).     Assessment:   This is a routine wellness examination for Zebbie.  Hearing/Vision screen Vision Screening - Comments:: Regular eye exams, Dr. Sabra Heck  Dietary issues and exercise activities discussed: Current Exercise Habits: Home exercise routine, Type of exercise: Other - see comments;stretching;strength training/weights (stationary bike), Frequency (Times/Week): 3   Goals Addressed             This Visit's Progress    Patient Stated       11/29/2022, get through surgery       Depression Screen    11/29/2022    3:42 PM 11/27/2021    1:36 PM 11/27/2021    1:32 PM 10/24/2020    9:37 AM 08/01/2020    8:24 AM 12/08/2019    9:31 AM  PHQ 2/9 Scores  PHQ - 2 Score 0 0 0 1 0 0    Fall Risk    11/29/2022    3:41 PM 11/23/2022    6:04 PM 11/27/2021    1:34 PM 01/19/2021   10:08 AM 10/24/2020    9:37 AM  Fall Risk   Falls in the past year? 1 1 0 1 0  Number falls in past yr: 1 1 0 0   Injury with Fall? 0 0 0 1   Risk for fall due to :  Medication side effect;Impaired mobility History of fall(s);Impaired balance/gait  History of fall(s)   Follow up Falls prevention discussed;Education provided;Falls evaluation completed Falls evaluation completed;Education provided;Falls prevention discussed Falls evaluation completed Falls evaluation completed     FALL RISK PREVENTION PERTAINING TO THE HOME:  Any stairs in or around the home? No  If so, are there any without handrails? N/a Home free of loose throw rugs in walkways, pet beds, electrical cords, etc? Yes  Adequate lighting in your home to reduce risk of falls? Yes   ASSISTIVE DEVICES UTILIZED TO PREVENT FALLS:  Life alert? No  Use of a cane, walker or w/c? No  Grab bars in the bathroom? Yes  Shower chair or bench in shower? No  Elevated toilet seat or a handicapped toilet? Yes   TIMED UP AND GO:  Was the test performed? Yes .  Length of time to ambulate 10 feet: 7 sec.   Gait slow and steady without use of assistive device  Cognitive Function:        11/29/2022    3:45 PM 08/01/2020    8:48 AM  6CIT Screen  What Year? 0 points 0 points  What month? 0 points 0 points  What time? 0 points 0 points  Count back from 20 0 points 0 points  Months in reverse 2 points 2 points  Repeat phrase 4 points 4 points  Total Score 6 points 6 points    Immunizations Immunization History  Administered Date(s) Administered   Fluad Quad(high Dose 65+) 08/01/2020, 08/27/2021, 07/22/2022   Influenza-Unspecified 09/07/2018, 08/05/2019   PFIZER(Purple Top)SARS-COV-2 Vaccination 02/03/2020, 02/28/2020   Pneumococcal Conjugate-13 08/01/2020   Tdap  02/22/2021    TDAP status: Up to date  Flu Vaccine status: Up to date  Pneumococcal vaccine status: Up to date  Covid-19 vaccine status: Completed vaccines  Qualifies for Shingles Vaccine? Yes   Zostavax completed No   Shingrix Completed?: No.    Education has been provided regarding the importance of this vaccine. Patient  has been advised to call insurance company to determine out of pocket expense if they have not yet received this vaccine. Advised may also receive vaccine at local pharmacy or Health Dept. Verbalized acceptance and understanding.  Screening Tests Health Maintenance  Topic Date Due   FOOT EXAM  Never done   COVID-19 Vaccine (3 - Pfizer risk series) 12/07/2022 (Originally 03/27/2020)   Zoster Vaccines- Shingrix (1 of 2) 02/20/2023 (Originally 11/01/1949)   Pneumonia Vaccine 55+ Years old (2 - PPSV23 or PCV20) 11/22/2023 (Originally 08/01/2021)   OPHTHALMOLOGY EXAM  04/04/2023   HEMOGLOBIN A1C  05/22/2023   Medicare Annual Wellness (AWV)  11/30/2023   DTaP/Tdap/Td (2 - Td or Tdap) 02/23/2031   INFLUENZA VACCINE  Completed   HPV VACCINES  Aged Out    Health Maintenance  Health Maintenance Due  Topic Date Due   FOOT EXAM  Never done    Colorectal cancer screening: No longer required.   Lung Cancer Screening: (Low Dose CT Chest recommended if Age 35-80 years, 30 pack-year currently smoking OR have quit w/in 15years.) does not qualify.   Lung Cancer Screening Referral: no  Additional Screening:  Hepatitis C Screening: does not qualify;   Vision Screening: Recommended annual ophthalmology exams for early detection of glaucoma and other disorders of the eye. Is the patient up to date with their annual eye exam?  Yes  Who is the provider or what is the name of the office in which the patient attends annual eye exams? Dr.Miller If pt is not established with a provider, would they like to be referred to a provider to establish care? No .   Dental Screening: Recommended annual dental exams for proper oral hygiene  Community Resource Referral / Chronic Care Management: CRR required this visit?  No   CCM required this visit?  No      Plan:     I have personally reviewed and noted the following in the patient's chart:   Medical and social history Use of alcohol, tobacco or illicit  drugs  Current medications and supplements including opioid prescriptions. Patient is not currently taking opioid prescriptions. Functional ability and status Nutritional status Physical activity Advanced directives List of other physicians Hospitalizations, surgeries, and ER visits in previous 12 months Vitals Screenings to include cognitive, depression, and falls Referrals and appointments  In addition, I have reviewed and discussed with patient certain preventive protocols, quality metrics, and best practice recommendations. A written personalized care plan for preventive services as well as general preventive health recommendations were provided to patient.     Barb Merino, LPN   01/28/7123   Nurse Notes: none

## 2022-12-05 ENCOUNTER — Other Ambulatory Visit: Payer: Self-pay | Admitting: Cardiovascular Disease

## 2022-12-06 ENCOUNTER — Telehealth: Payer: Self-pay | Admitting: Nurse Practitioner

## 2022-12-06 NOTE — Telephone Encounter (Signed)
Need to discuss urinary symptoms

## 2022-12-06 NOTE — Telephone Encounter (Signed)
Informed Gregory. [name] about Dr. Tawanna Sat response the correlation between urinary incontinence and back pain.  "Yes, I think it could be related. He mentioned it to me in the office. He still had control and knowledge of needing to go. He had no saddle anesthesia. It sounded more like a functional incontinence when he described it to me. His bowel incontinence sounded like it was happening with flatus and was small amounts. There are a significant number of patients with urinary issues with stenosis. As high as 50% of elective spine patients have moderate-severe lower urinary tract symptoms. One of those symptoms includes difficulty in postponing urination."  [name] verbalized understanding and did not have any questions at this time

## 2022-12-09 ENCOUNTER — Other Ambulatory Visit: Payer: Self-pay | Admitting: Orthopedic Surgery

## 2022-12-10 ENCOUNTER — Other Ambulatory Visit: Payer: Self-pay | Admitting: Radiology

## 2022-12-11 NOTE — Progress Notes (Signed)
Surgical Instructions    Your procedure is scheduled on Friday February 16th.  Report to [facility] Main Entrance "A" at 10:30 A.M., then check in with the Admitting office.  Call this number if you have problems the morning of surgery:  [phone]   If you have any questions prior to your surgery date call [phone]: Open Monday-Friday 8am-4pm If you experience any cold or flu symptoms such as cough, fever, chills, shortness of breath, etc. between now and your scheduled surgery, please notify us at the above number     Remember:  Do not eat after midnight the night before your surgery  You may drink clear liquids until 9:30am the morning of your surgery.   Clear liquids allowed are: Water, Non-Citrus Juices (without pulp), Carbonated Beverages, Clear Tea, Black Coffee ONLY (NO MILK, CREAM OR POWDERED CREAMER of any kind), and Gatorade    Take these medicines the morning of surgery with A SIP OF WATER: amLODipine (NORVASC) 5 MG tablet  levothyroxine (SYNTHROID) 88 MCG tablet  pravastatin (PRAVACHOL) 40 MG tablet    IF NEEDED  acetaminophen (TYLENOL) 500 MG tablet  methocarbamol (ROBAXIN) 500 MG tablet    Follow your surgeon's instructions on when to stop PLAVIX.  If no instructions were given by your surgeon then you will need to call the office to get those instructions.      As of today, STOP taking any Aspirin (unless otherwise instructed by your surgeon) Aleve, Naproxen, Ibuprofen, Motrin, Advil, Goody's, BC's, all herbal medications, fish oil, and all vitamins.           Do not wear jewelry  Do not wear lotions, powders, cologne or deodorant. Do not shave 48 hours prior to surgery.  Men may shave face and neck. Do not bring valuables to the hospital. Do not wear nail polish, gel polish, artificial nails, or any other type of covering on natural nails (fingers and toes) If you have artificial nails or gel coating that need to be removed by a nail salon, please have  this removed prior to surgery. Artificial nails or gel coating may interfere with anesthesia's ability to adequately monitor your vital signs.  [place] is not responsible for any belongings or valuables.    Do NOT Smoke (Tobacco/Vaping)  24 hours prior to your procedure  If you use a CPAP at night, you may bring your mask for your overnight stay.   Contacts, glasses, hearing aids, dentures or partials may not be worn into surgery, please bring cases for these belongings   For patients admitted to the hospital, discharge time will be determined by your treatment team.   Patients discharged the day of surgery will not be allowed to drive home, and someone needs to stay with them for 24 hours.   SURGICAL WAITING ROOM VISITATION Patients having surgery or a procedure may have no more than 2 support people in the waiting area - these visitors may rotate.   Children under the age of 57 must have an adult with them who is not the patient. If the patient needs to stay at the hospital during part of their recovery, the visitor guidelines for inpatient rooms apply. Pre-op nurse will coordinate an appropriate time for 1 support person to accompany patient in pre-op.  This support person may not rotate.   Please refer to [email or web address] for the visitor guidelines for Inpatients (after your surgery is over and you are in a regular room).    Special instructions:  Oral Hygiene is also important to reduce your risk of infection.  Remember - BRUSH YOUR TEETH THE MORNING OF SURGERY WITH YOUR REGULAR TOOTHPASTE   [place]- Preparing For Surgery  Before surgery, you can play an important role. Because skin is not sterile, your skin needs to be as free of germs as possible. You can reduce the number of germs on your skin by washing with CHG (chlorahexidine gluconate) Soap before surgery.  CHG is an antiseptic cleaner which kills germs and  bonds with the skin to continue killing germs even after washing.     Please do not use if you have an allergy to CHG or antibacterial soaps. If your skin becomes reddened/irritated stop using the CHG.  Do not shave (including legs and underarms) for at least 48 hours prior to first CHG shower. It is OK to shave your face.  Please follow these instructions carefully.     Shower the NIGHT BEFORE SURGERY and the MORNING OF SURGERY with CHG Soap.   If you chose to wash your hair, wash your hair first as usual with your normal shampoo. After you shampoo, rinse your hair and body thoroughly to remove the shampoo.  Then [employer] and genitals (private parts) with your normal soap and rinse thoroughly to remove soap.  After that Use CHG Soap as you would any other liquid soap. You can apply CHG directly to the skin and wash gently with a scrungie or a clean washcloth.   Apply the CHG Soap to your body ONLY FROM THE NECK DOWN.  Do not use on open wounds or open sores. Avoid contact with your eyes, ears, mouth and genitals (private parts). Wash Face and genitals (private parts)  with your normal soap.   Wash thoroughly, paying special attention to the area where your surgery will be performed.  Thoroughly rinse your body with warm water from the neck down.  DO NOT shower/wash with your normal soap after using and rinsing off the CHG Soap.  Pat yourself dry with a CLEAN TOWEL.  Wear CLEAN PAJAMAS to bed the night before surgery  Place CLEAN SHEETS on your bed the night before your surgery  DO NOT SLEEP WITH PETS.   Day of Surgery:  Take a shower with CHG soap. Wear Clean/Comfortable clothing the morning of surgery Do not apply any deodorants/lotions.   Remember to brush your teeth WITH YOUR REGULAR TOOTHPASTE.    If you received a COVID test during your pre-op visit, it is requested that you wear a mask when out in public, stay away from anyone that may not be feeling well, and notify  your surgeon if you develop symptoms. If you have been in contact with anyone that has tested positive in the last 10 days, please notify your surgeon.    Please read over the following fact sheets that you were given.

## 2022-12-12 ENCOUNTER — Encounter (HOSPITAL_COMMUNITY): Payer: Self-pay

## 2022-12-12 ENCOUNTER — Encounter (HOSPITAL_COMMUNITY)
Admission: RE | Admit: 2022-12-12 | Discharge: 2022-12-12 | Disposition: A | Discharge status: Home / Self Care | Payer: Medicare Other | Source: Ambulatory Visit | Attending: Orthopedic Surgery | Admitting: Orthopedic Surgery

## 2022-12-12 ENCOUNTER — Other Ambulatory Visit: Payer: Self-pay

## 2022-12-12 VITALS — BP 140/48 | HR 55 | Temp 97.5°F | Resp 18 | Ht 69.0 in | Wt 167.3 lb

## 2022-12-12 DIAGNOSIS — I6521 Occlusion and stenosis of right carotid artery: Secondary | ICD-10-CM | POA: Insufficient documentation

## 2022-12-12 DIAGNOSIS — I10 Essential (primary) hypertension: Secondary | ICD-10-CM | POA: Diagnosis not present

## 2022-12-12 DIAGNOSIS — I083 Combined rheumatic disorders of mitral, aortic and tricuspid valves: Secondary | ICD-10-CM | POA: Insufficient documentation

## 2022-12-12 DIAGNOSIS — Z9861 Coronary angioplasty status: Secondary | ICD-10-CM | POA: Insufficient documentation

## 2022-12-12 DIAGNOSIS — G8929 Other chronic pain: Secondary | ICD-10-CM

## 2022-12-12 DIAGNOSIS — Z01812 Encounter for preprocedural laboratory examination: Secondary | ICD-10-CM | POA: Diagnosis not present

## 2022-12-12 DIAGNOSIS — Z01818 Encounter for other preprocedural examination: Secondary | ICD-10-CM

## 2022-12-12 DIAGNOSIS — I2510 Atherosclerotic heart disease of native coronary artery without angina pectoris: Secondary | ICD-10-CM | POA: Insufficient documentation

## 2022-12-12 HISTORY — DX: Hypothyroidism, unspecified: E03.9

## 2022-12-12 LAB — CBC
HCT: 43.6 % (ref 39.0–52.0)
Hemoglobin: 15.1 g/dL (ref 13.0–17.0)
MCH: 33.6 pg (ref 26.0–34.0)
MCHC: 34.6 g/dL (ref 30.0–36.0)
MCV: 97.1 fL (ref 80.0–100.0)
Platelets: 234 10*3/uL (ref 150–400)
RBC: 4.49 MIL/uL (ref 4.22–5.81)
RDW: 12.5 % (ref 11.5–15.5)
WBC: 7.9 10*3/uL (ref 4.0–10.5)
nRBC: 0 % (ref 0.0–0.2)

## 2022-12-12 LAB — BASIC METABOLIC PANEL
Anion gap: 10 (ref 5–15)
BUN: 23 mg/dL (ref 8–23)
CO2: 23 mmol/L (ref 22–32)
Calcium: 9.5 mg/dL (ref 8.9–10.3)
Chloride: 106 mmol/L (ref 98–111)
Creatinine, Ser: 1.36 mg/dL — ABNORMAL HIGH (ref 0.61–1.24)
GFR, Estimated: 49 mL/min — ABNORMAL LOW (ref >60)
Glucose, Bld: 117 mg/dL — ABNORMAL HIGH (ref 70–99)
Potassium: 4.1 mmol/L (ref 3.5–5.1)
Sodium: 139 mmol/L (ref 135–145)

## 2022-12-12 LAB — SURGICAL PCR SCREEN
MRSA, PCR: NEGATIVE
Staphylococcus aureus: POSITIVE — AB

## 2022-12-12 NOTE — Progress Notes (Signed)
PCP - Charlene Brooke Nche NP Cardiologist - Natale Lay  PPM/ICD - denies Device Orders -  Rep Notified -   Chest x-ray - na EKG - 09/22/23 Stress Test - none ECHO - 12/03/21 Cardiac Cath -   Sleep Study - denies CPAP -   Fasting Blood Sugar -na  Checks Blood Sugar _____ times a day  Last dose of GLP1 agonist-  na GLP1 instructions:   Blood Thinner Instructions:pt reports his last dose of Plavix will be 12/14/22 per Dr. Elmarie Shiley instructions.  Aspirin Instructions:  ERAS Protcol -clear liquids until 0930 PRE-SURGERY Ensure or G2- Ensure  COVID TEST- na   Anesthesia review: cardiac history-CAD-PCI of RCA March 2012  Patient denies shortness of breath, fever, cough and chest pain at PAT appointment   All instructions explained to the patient, with a verbal understanding of the material. Patient agrees to go over the instructions while at home for a better understanding. Patient also instructed to self quarantine after being tested for COVID-19. The opportunity to ask questions was provided.

## 2022-12-12 NOTE — Progress Notes (Signed)
Surgical Instructions    Your procedure is scheduled on Friday February 16th.  Report to [facility] Main Entrance "A" at 10:30 A.M., then check in with the Admitting office.  Call this number if you have problems the morning of surgery:  [phone]   If you have any questions prior to your surgery date call [phone]: Open Monday-Friday 8am-4pm If you experience any cold or flu symptoms such as cough, fever, chills, shortness of breath, etc. between now and your scheduled surgery, please notify us at the above number     Remember:  Do not eat after midnight the night before your surgery  You may drink clear liquids until 9:30am the morning of your surgery.   Clear liquids allowed are: Water, Non-Citrus Juices (without pulp), Carbonated Beverages, Clear Tea, Black Coffee ONLY (NO MILK, CREAM OR POWDERED CREAMER of any kind), and Gatorade   The night before surgery:  No food after midnight. ONLY clear liquids after midnight  The day of surgery (if you do NOT have diabetes):  Drink ONE (1) Pre-Surgery Clear Ensure by 9:30am the morning of surgery. Drink in one sitting. Do not sip.  This drink was given to you during your hospital  pre-op appointment visit.  Nothing else to drink after completing the  Pre-Surgery Clear Ensure         If you have questions, please contact your surgeon's office.      Take these medicines the morning of surgery with A SIP OF WATER: amLODipine (NORVASC) 5 MG tablet  levothyroxine (SYNTHROID) 88 MCG tablet  pravastatin (PRAVACHOL) 40 MG tablet    IF NEEDED  acetaminophen (TYLENOL) 500 MG tablet  methocarbamol (ROBAXIN) 500 MG tablet    Follow your surgeon's instructions on when to stop PLAVIX.  If no instructions were given by your surgeon then you will need to call the office to get those instructions.      As of today, STOP taking any Aspirin (unless otherwise instructed by your surgeon) Aleve, Naproxen, Ibuprofen, Motrin, Advil,  Goody's, BC's, all herbal medications, fish oil, and all vitamins.           Do not wear jewelry  Do not wear lotions, powders, cologne or deodorant. Do not shave 48 hours prior to surgery.  Men may shave face and neck. Do not bring valuables to the hospital. Do not wear nail polish, gel polish, artificial nails, or any other type of covering on natural nails (fingers and toes) If you have artificial nails or gel coating that need to be removed by a nail salon, please have this removed prior to surgery. Artificial nails or gel coating may interfere with anesthesia's ability to adequately monitor your vital signs.  [place] is not responsible for any belongings or valuables.    Do NOT Smoke (Tobacco/Vaping)  24 hours prior to your procedure  If you use a CPAP at night, you may bring your mask for your overnight stay.   Contacts, glasses, hearing aids, dentures or partials may not be worn into surgery, please bring cases for these belongings   For patients admitted to the hospital, discharge time will be determined by your treatment team.   Patients discharged the day of surgery will not be allowed to drive home, and someone needs to stay with them for 24 hours.   SURGICAL WAITING ROOM VISITATION Patients having surgery or a procedure may have no more than 2 support people in the waiting area - these visitors may rotate.  Children under the age of 62 must have an adult with them who is not the patient. If the patient needs to stay at the hospital during part of their recovery, the visitor guidelines for inpatient rooms apply. Pre-op nurse will coordinate an appropriate time for 1 support person to accompany patient in pre-op.  This support person may not rotate.   Please refer to [email or web address] for the visitor guidelines for Inpatients (after your surgery is over and you are in a regular room).    Special instructions:    Oral  Hygiene is also important to reduce your risk of infection.  Remember - BRUSH YOUR TEETH THE MORNING OF SURGERY WITH YOUR REGULAR TOOTHPASTE   [place]- Preparing For Surgery  Before surgery, you can play an important role. Because skin is not sterile, your skin needs to be as free of germs as possible. You can reduce the number of germs on your skin by washing with CHG (chlorahexidine gluconate) Soap before surgery.  CHG is an antiseptic cleaner which kills germs and bonds with the skin to continue killing germs even after washing.     Please do not use if you have an allergy to CHG or antibacterial soaps. If your skin becomes reddened/irritated stop using the CHG.  Do not shave (including legs and underarms) for at least 48 hours prior to first CHG shower. It is OK to shave your face.  Please follow these instructions carefully.     Shower the NIGHT BEFORE SURGERY and the MORNING OF SURGERY with CHG Soap.   If you chose to wash your hair, wash your hair first as usual with your normal shampoo. After you shampoo, rinse your hair and body thoroughly to remove the shampoo.  Then [employer] and genitals (private parts) with your normal soap and rinse thoroughly to remove soap.  After that Use CHG Soap as you would any other liquid soap. You can apply CHG directly to the skin and wash gently with a scrungie or a clean washcloth.   Apply the CHG Soap to your body ONLY FROM THE NECK DOWN.  Do not use on open wounds or open sores. Avoid contact with your eyes, ears, mouth and genitals (private parts). Wash Face and genitals (private parts)  with your normal soap.   Wash thoroughly, paying special attention to the area where your surgery will be performed.  Thoroughly rinse your body with warm water from the neck down.  DO NOT shower/wash with your normal soap after using and rinsing off the CHG Soap.  Pat yourself dry with a CLEAN TOWEL.  Wear CLEAN PAJAMAS to bed the night before  surgery  Place CLEAN SHEETS on your bed the night before your surgery  DO NOT SLEEP WITH PETS.   Day of Surgery:  Take a shower with CHG soap. Wear Clean/Comfortable clothing the morning of surgery Do not apply any deodorants/lotions.   Remember to brush your teeth WITH YOUR REGULAR TOOTHPASTE.    If you received a COVID test during your pre-op visit, it is requested that you wear a mask when out in public, stay away from anyone that may not be feeling well, and notify your surgeon if you develop symptoms. If you have been in contact with anyone that has tested positive in the last 10 days, please notify your surgeon.    Please read over the following fact sheets that you were given.

## 2022-12-13 NOTE — Anesthesia Preprocedure Evaluation (Addendum)
Anesthesia Evaluation  Patient identified by MRN, date of birth, ID band Patient awake    Reviewed: Allergy & Precautions, H&P , NPO status , Patient's Chart, lab work & pertinent test results  Airway Mallampati: II  TM Distance: >3 FB Neck ROM: Full    Dental no notable dental hx.    Pulmonary neg pulmonary ROS, former smoker   Pulmonary exam normal breath sounds clear to auscultation       Cardiovascular hypertension, Pt. on medications + CAD  Normal cardiovascular exam Rhythm:Regular Rate:Normal     Neuro/Psych negative neurological ROS  negative psych ROS   GI/Hepatic negative GI ROS, Neg liver ROS,,,  Endo/Other  diabetes, Type 2Hypothyroidism    Renal/GU Renal InsufficiencyRenal disease  negative genitourinary   Musculoskeletal negative musculoskeletal ROS (+)    Abdominal   Peds negative pediatric ROS (+)  Hematology negative hematology ROS (+)   Anesthesia Other Findings   Reproductive/Obstetrics negative OB ROS                             Anesthesia Physical Anesthesia Plan  ASA: 3  Anesthesia Plan: General   Post-op Pain Management: Dilaudid IV   Induction: Intravenous  PONV Risk Score and Plan: 2 and Ondansetron, Midazolam and Treatment may vary due to age or medical condition  Airway Management Planned: Oral ETT  Additional Equipment:   Intra-op Plan:   Post-operative Plan: Extubation in OR  Informed Consent: I have reviewed the patients History and Physical, chart, labs and discussed the procedure including the risks, benefits and alternatives for the proposed anesthesia with the patient or authorized representative who has indicated his/her understanding and acceptance.     Dental advisory given  Plan Discussed with: CRNA  Anesthesia Plan Comments: (PAT note by Karoline Caldwell, PA-C:" Follows with cardiologist Dr. Acie Fredrickson for history of moderate aortic  stenosis (mean gradient 26 mmHg by echo 11/2021), carotid stenosis (bilateral 40 to 59% ICA by duplex 11/2022) HTN, CAD s/p PTCA and stenting of the mid right coronary artery in 2012.  Last seen 11/28/2022 and noted to be very active, no chest pain or dyspnea.  Coming surgery was discussed.  Per note, "While I am sure that his aortic valve is gradually worsening, I do think that he is still at low risk for his upcoming back surgery.  I do not think he needs any further evaluation prior to clearing his back surgery.  He is at low risk.  He may hold his Plavix for 5 to 7 days prior to his surgical procedure."  Patient reports last dose Plavix 12/14/2022.  Diet controlled DM2, A1c 6.3 on 11/21/2022.  History of CKD 3.  Preop labs reviewed, creatinine mildly elevated 1.36, otherwise unremarkable.  EKG 11/21/2022: This rhythm.  Rate 62.  Right bundle branch block.  Carotid duplex 11/21/2022: Summary:  Right Carotid: Velocities in the right ICA are consistent with a 40-59% stenosis. Non-hemodynamically significant plaque <50% noted in the CCA. The ECA appears >50% stenosed. Essentially stable PSV in the ICA; decrease EDV, stenosis based on peak systolic velocities.  Left Carotid: Velocities in the left ICA are consistent with a 40-59% stenosis. Non-hemodynamically significant plaque <50% noted in the CCA. Essentially stable PSV in the ICA; decrease EDV, stenosis based on peak systolic velocities.  Vertebrals: Bilateral vertebral arteries demonstrate antegrade flow.  Subclavians: Right subclavian artery was stenotic. Right subclavian artery flow was disturbed. Normal flow hemodynamics were seen in the left  subclavian artery.   *See table(s) above for measurements and observations.  Suggest follow up study in 12 months.   TTE 12/03/2021: 1. Normal LV stroke Volume Index, DVI 0.34. The aortic valve is  tricuspid. There is moderate calcification of the aortic valve. Mean  gradient 26 mm Hg.Aortic valve  regurgitation is mild. Moderate aortic  valve stenosis.  2. Left ventricular ejection fraction, by estimation, is 65 to 70%. Left  ventricular ejection fraction by 3D volume is 72 %. The left ventricle has  normal function. The left ventricle has no regional wall motion  abnormalities. There is mild concentric  left ventricular hypertrophy. Left ventricular diastolic parameters are  indeterminate. Elevated left atrial pressure. The average left ventricular  global longitudinal strain is 21.6 %. The global longitudinal strain is  normal.  3. Right ventricular systolic function is normal. The right ventricular  size is normal. Tricuspid regurgitation signal is inadequate for assessing  PA pressure.  4. Left atrial size was mildly dilated.  5. A small pericardial effusion is present. The pericardial effusion is  anterior to the right ventricle.  6. The mitral valve is degenerative. Mild mitral valve regurgitation. The  mean mitral valve gradient is 2.6 mmHg with average heart rate of 51 bpm.    )        Anesthesia Quick Evaluation

## 2022-12-13 NOTE — Progress Notes (Signed)
Anesthesia Chart Review:  Follows with cardiologist Dr. Acie Fredrickson for history of moderate aortic stenosis (mean gradient 26 mmHg by echo 11/2021), carotid stenosis (bilateral 40 to 59% ICA by duplex 11/2022) HTN, CAD s/p PTCA and stenting of the mid right coronary artery in 2012.  Last seen 11/28/2022 and noted to be very active, no chest pain or dyspnea.  Coming surgery was discussed.  Per note, "While I am sure that his aortic valve is gradually worsening, I do think that he is still at low risk for his upcoming back surgery.  I do not think he needs any further evaluation prior to clearing his back surgery.  He is at low risk.  He may hold his Plavix for 5 to 7 days prior to his surgical procedure."  Patient reports last dose Plavix 12/14/2022.  Diet controlled DM2, A1c 6.3 on 11/21/2022.  History of CKD 3.  Preop labs reviewed, creatinine mildly elevated 1.36, otherwise unremarkable.  EKG 11/21/2022: This rhythm.  Rate 62.  Right bundle branch block.  Carotid duplex 11/21/2022: Summary:  Right Carotid: Velocities in the right ICA are consistent with a 40-59% stenosis. Non-hemodynamically significant plaque <50% noted in the CCA. The ECA appears >50% stenosed. Essentially stable PSV in the ICA; decrease EDV, stenosis based on peak systolic velocities.  Left Carotid: Velocities in the left ICA are consistent with a 40-59% stenosis. Non-hemodynamically significant plaque <50% noted in the CCA. Essentially stable PSV in the ICA; decrease EDV, stenosis based on peak systolic velocities.  Vertebrals:  Bilateral vertebral arteries demonstrate antegrade flow.  Subclavians: Right subclavian artery was stenotic. Right subclavian artery flow was disturbed. Normal flow hemodynamics were seen in the left subclavian artery.   *See table(s) above for measurements and observations.  Suggest follow up study in 12 months.   TTE 12/03/2021:  1. Normal LV stroke Volume Index, DVI 0.34. The aortic valve is  tricuspid.  There is moderate calcification of the aortic valve. Mean  gradient 26 mm Hg.Aortic valve regurgitation is mild. Moderate aortic  valve stenosis.   2. Left ventricular ejection fraction, by estimation, is 65 to 70%. Left  ventricular ejection fraction by 3D volume is 72 %. The left ventricle has  normal function. The left ventricle has no regional wall motion  abnormalities. There is mild concentric  left ventricular hypertrophy. Left ventricular diastolic parameters are  indeterminate. Elevated left atrial pressure. The average left ventricular  global longitudinal strain is 21.6 %. The global longitudinal strain is  normal.   3. Right ventricular systolic function is normal. The right ventricular  size is normal. Tricuspid regurgitation signal is inadequate for assessing  PA pressure.   4. Left atrial size was mildly dilated.   5. A small pericardial effusion is present. The pericardial effusion is  anterior to the right ventricle.   6. The mitral valve is degenerative. Mild mitral valve regurgitation. The  mean mitral valve gradient is 2.6 mmHg with average heart rate of 51 bpm.      Wynonia Musty [facility] Short Stay Center/Anesthesiology Phone [phone] 12/13/2022 4:25 PM

## 2022-12-20 ENCOUNTER — Encounter (HOSPITAL_COMMUNITY): Payer: Self-pay | Admitting: Orthopedic Surgery

## 2022-12-20 ENCOUNTER — Inpatient Hospital Stay (HOSPITAL_COMMUNITY)
Admission: RE | Admit: 2022-12-20 | Discharge: 2022-12-25 | DRG: 519 | Disposition: A | Discharge status: Home Health | Payer: Medicare Other | Source: Ambulatory Visit | Attending: Orthopedic Surgery | Admitting: Orthopedic Surgery

## 2022-12-20 ENCOUNTER — Ambulatory Visit (HOSPITAL_BASED_OUTPATIENT_CLINIC_OR_DEPARTMENT_OTHER): Payer: Medicare Other | Admitting: Certified Registered Nurse Anesthetist

## 2022-12-20 ENCOUNTER — Ambulatory Visit (HOSPITAL_COMMUNITY): Payer: Medicare Other

## 2022-12-20 ENCOUNTER — Encounter (HOSPITAL_COMMUNITY)
Admission: RE | Disposition: A | Discharge status: Home Health | Payer: Self-pay | Source: Ambulatory Visit | Attending: Orthopedic Surgery

## 2022-12-20 ENCOUNTER — Other Ambulatory Visit: Payer: Self-pay

## 2022-12-20 ENCOUNTER — Ambulatory Visit (HOSPITAL_COMMUNITY): Payer: Medicare Other | Admitting: Physician Assistant

## 2022-12-20 DIAGNOSIS — I252 Old myocardial infarction: Secondary | ICD-10-CM

## 2022-12-20 DIAGNOSIS — Z87891 Personal history of nicotine dependence: Secondary | ICD-10-CM | POA: Diagnosis not present

## 2022-12-20 DIAGNOSIS — I779 Disorder of arteries and arterioles, unspecified: Secondary | ICD-10-CM | POA: Diagnosis not present

## 2022-12-20 DIAGNOSIS — R079 Chest pain, unspecified: Secondary | ICD-10-CM | POA: Diagnosis not present

## 2022-12-20 DIAGNOSIS — I2510 Atherosclerotic heart disease of native coronary artery without angina pectoris: Secondary | ICD-10-CM | POA: Diagnosis not present

## 2022-12-20 DIAGNOSIS — Y838 Other surgical procedures as the cause of abnormal reaction of the patient, or of later complication, without mention of misadventure at the time of the procedure: Secondary | ICD-10-CM | POA: Diagnosis not present

## 2022-12-20 DIAGNOSIS — M48062 Spinal stenosis, lumbar region with neurogenic claudication: Principal | ICD-10-CM | POA: Diagnosis present

## 2022-12-20 DIAGNOSIS — E785 Hyperlipidemia, unspecified: Secondary | ICD-10-CM | POA: Diagnosis not present

## 2022-12-20 DIAGNOSIS — E039 Hypothyroidism, unspecified: Secondary | ICD-10-CM | POA: Diagnosis present

## 2022-12-20 DIAGNOSIS — G9600 Cerebrospinal fluid leak, unspecified: Secondary | ICD-10-CM | POA: Diagnosis not present

## 2022-12-20 DIAGNOSIS — M961 Postlaminectomy syndrome, not elsewhere classified: Secondary | ICD-10-CM | POA: Diagnosis not present

## 2022-12-20 DIAGNOSIS — G9741 Accidental puncture or laceration of dura during a procedure: Secondary | ICD-10-CM | POA: Diagnosis not present

## 2022-12-20 DIAGNOSIS — I10 Essential (primary) hypertension: Secondary | ICD-10-CM | POA: Diagnosis present

## 2022-12-20 DIAGNOSIS — E119 Type 2 diabetes mellitus without complications: Secondary | ICD-10-CM

## 2022-12-20 DIAGNOSIS — G8929 Other chronic pain: Principal | ICD-10-CM

## 2022-12-20 HISTORY — PX: DECOMPRESSIVE LUMBAR LAMINECTOMY LEVEL 2: SHX5792

## 2022-12-20 HISTORY — DX: Type 2 diabetes mellitus without complications: E11.9

## 2022-12-20 LAB — GLUCOSE, CAPILLARY: Glucose-Capillary: 168 mg/dL — ABNORMAL HIGH (ref 70–99)

## 2022-12-20 SURGERY — DECOMPRESSIVE LUMBAR LAMINECTOMY LEVEL 2
Anesthesia: General | Site: Spine Lumbar

## 2022-12-20 MED ORDER — INSULIN ASPART 100 UNIT/ML IJ SOLN
0.0000 [IU] | Freq: Every day | INTRAMUSCULAR | Status: DC
  Administered 2022-12-22: 2 [IU] via SUBCUTANEOUS

## 2022-12-20 MED ORDER — HYDROMORPHONE HCL 1 MG/ML IJ SOLN
0.5000 mg | INTRAMUSCULAR | Status: DC | PRN
  Filled 2022-12-20: qty 1

## 2022-12-20 MED ORDER — SUGAMMADEX SODIUM 200 MG/2ML IV SOLN
INTRAVENOUS | Status: DC | PRN
  Administered 2022-12-20: 300 mg via INTRAVENOUS

## 2022-12-20 MED ORDER — ROCURONIUM BROMIDE 10 MG/ML (PF) SYRINGE
PREFILLED_SYRINGE | INTRAVENOUS | Status: AC
  Filled 2022-12-20: qty 10

## 2022-12-20 MED ORDER — ONDANSETRON HCL 4 MG PO TABS: 4.0000 mg | ORAL_TABLET | Freq: Four times a day (QID) | ORAL | Status: DC | PRN

## 2022-12-20 MED ORDER — METHYLPREDNISOLONE ACETATE 40 MG/ML IJ SUSP
INTRAMUSCULAR | Status: AC
  Filled 2022-12-20: qty 1

## 2022-12-20 MED ORDER — PHENYLEPHRINE 80 MCG/ML (10ML) SYRINGE FOR IV PUSH (FOR BLOOD PRESSURE SUPPORT)
PREFILLED_SYRINGE | INTRAVENOUS | Status: AC
  Filled 2022-12-20: qty 10

## 2022-12-20 MED ORDER — HYDROMORPHONE HCL 1 MG/ML IJ SOLN
INTRAMUSCULAR | Status: AC
  Filled 2022-12-20: qty 1

## 2022-12-20 MED ORDER — THROMBIN 20000 UNITS EX SOLR
CUTANEOUS | Status: DC | PRN
  Administered 2022-12-20: 20 mL via TOPICAL

## 2022-12-20 MED ORDER — OXYCODONE HCL 5 MG PO TABS
5.0000 mg | ORAL_TABLET | ORAL | Status: DC | PRN
  Administered 2022-12-22: 10 mg via ORAL
  Filled 2022-12-20: qty 1
  Filled 2022-12-20: qty 2

## 2022-12-20 MED ORDER — HYDROMORPHONE HCL 1 MG/ML IJ SOLN
0.2500 mg | INTRAMUSCULAR | Status: DC | PRN
  Administered 2022-12-20 (×2): 0.5 mg via INTRAVENOUS

## 2022-12-20 MED ORDER — AMISULPRIDE (ANTIEMETIC) 5 MG/2ML IV SOLN: 10.0000 mg | Freq: Once | INTRAVENOUS | Status: DC | PRN

## 2022-12-20 MED ORDER — PHENYLEPHRINE HCL-NACL 20-0.9 MG/250ML-% IV SOLN
INTRAVENOUS | Status: DC | PRN
  Administered 2022-12-20: 15 ug/min via INTRAVENOUS

## 2022-12-20 MED ORDER — VANCOMYCIN HCL 500 MG IV SOLR
INTRAVENOUS | Status: AC
  Filled 2022-12-20: qty 10

## 2022-12-20 MED ORDER — CHLORHEXIDINE GLUCONATE 0.12 % MT SOLN
OROMUCOSAL | Status: AC
  Administered 2022-12-20: 15 mL via OROMUCOSAL
  Filled 2022-12-20: qty 15

## 2022-12-20 MED ORDER — LEVOTHYROXINE SODIUM 88 MCG PO TABS
88.0000 ug | ORAL_TABLET | Freq: Every day | ORAL | Status: DC
  Administered 2022-12-21 – 2022-12-25 (×5): 88 ug via ORAL
  Filled 2022-12-20 (×5): qty 1

## 2022-12-20 MED ORDER — AMLODIPINE BESYLATE 5 MG PO TABS
5.0000 mg | ORAL_TABLET | Freq: Every day | ORAL | Status: DC
  Administered 2022-12-21 – 2022-12-25 (×5): 5 mg via ORAL
  Filled 2022-12-20 (×5): qty 1

## 2022-12-20 MED ORDER — PROPOFOL 10 MG/ML IV BOLUS
INTRAVENOUS | Status: DC | PRN
  Administered 2022-12-20: 110 mg via INTRAVENOUS

## 2022-12-20 MED ORDER — METHYLPREDNISOLONE ACETATE 40 MG/ML IJ SUSP: INTRAMUSCULAR | Status: DC | PRN

## 2022-12-20 MED ORDER — TRANEXAMIC ACID-NACL 1000-0.7 MG/100ML-% IV SOLN
INTRAVENOUS | Status: AC
  Filled 2022-12-20: qty 100

## 2022-12-20 MED ORDER — OXYCODONE HCL 5 MG PO TABS: 5.0000 mg | ORAL_TABLET | Freq: Once | ORAL | Status: DC | PRN

## 2022-12-20 MED ORDER — LIDOCAINE 2% (20 MG/ML) 5 ML SYRINGE
INTRAMUSCULAR | Status: DC | PRN
  Administered 2022-12-20: 40 mg via INTRAVENOUS

## 2022-12-20 MED ORDER — CEFAZOLIN SODIUM-DEXTROSE 2-4 GM/100ML-% IV SOLN
2.0000 g | INTRAVENOUS | Status: AC
  Administered 2022-12-20: 2 g via INTRAVENOUS

## 2022-12-20 MED ORDER — DOCUSATE SODIUM 100 MG PO CAPS
100.0000 mg | ORAL_CAPSULE | Freq: Two times a day (BID) | ORAL | Status: DC
  Administered 2022-12-20 – 2022-12-25 (×9): 100 mg via ORAL
  Filled 2022-12-20 (×10): qty 1

## 2022-12-20 MED ORDER — GLYCOPYRROLATE PF 0.2 MG/ML IJ SOSY
PREFILLED_SYRINGE | INTRAMUSCULAR | Status: AC
  Filled 2022-12-20: qty 1

## 2022-12-20 MED ORDER — ROCURONIUM BROMIDE 10 MG/ML (PF) SYRINGE
PREFILLED_SYRINGE | INTRAVENOUS | Status: DC | PRN
  Administered 2022-12-20: 20 mg via INTRAVENOUS
  Administered 2022-12-20: 80 mg via INTRAVENOUS
  Administered 2022-12-20 (×2): 20 mg via INTRAVENOUS

## 2022-12-20 MED ORDER — ONDANSETRON HCL 4 MG/2ML IJ SOLN: 4.0000 mg | Freq: Four times a day (QID) | INTRAMUSCULAR | Status: DC | PRN

## 2022-12-20 MED ORDER — ORAL CARE MOUTH RINSE: 15.0000 mL | Freq: Once | OROMUCOSAL | Status: AC

## 2022-12-20 MED ORDER — METHOCARBAMOL 500 MG PO TABS
500.0000 mg | ORAL_TABLET | Freq: Four times a day (QID) | ORAL | Status: DC
  Administered 2022-12-20 – 2022-12-25 (×15): 500 mg via ORAL
  Filled 2022-12-20 (×16): qty 1

## 2022-12-20 MED ORDER — 0.9 % SODIUM CHLORIDE (POUR BTL) OPTIME
TOPICAL | Status: DC | PRN
  Administered 2022-12-20: 1000 mL

## 2022-12-20 MED ORDER — LOSARTAN POTASSIUM 50 MG PO TABS
50.0000 mg | ORAL_TABLET | Freq: Every day | ORAL | Status: DC
  Administered 2022-12-21 – 2022-12-25 (×5): 50 mg via ORAL
  Filled 2022-12-20 (×5): qty 1

## 2022-12-20 MED ORDER — DEXAMETHASONE SODIUM PHOSPHATE 10 MG/ML IJ SOLN
INTRAMUSCULAR | Status: AC
  Filled 2022-12-20: qty 1

## 2022-12-20 MED ORDER — BUPIVACAINE HCL (PF) 0.25 % IJ SOLN
INTRAMUSCULAR | Status: AC
  Filled 2022-12-20: qty 30

## 2022-12-20 MED ORDER — CEFAZOLIN SODIUM-DEXTROSE 2-4 GM/100ML-% IV SOLN
2.0000 g | Freq: Four times a day (QID) | INTRAVENOUS | Status: AC
  Administered 2022-12-20 – 2022-12-21 (×3): 2 g via INTRAVENOUS
  Filled 2022-12-20 (×3): qty 100

## 2022-12-20 MED ORDER — GLYCOPYRROLATE PF 0.2 MG/ML IJ SOSY
PREFILLED_SYRINGE | INTRAMUSCULAR | Status: DC | PRN
  Administered 2022-12-20: .2 mg via INTRAVENOUS

## 2022-12-20 MED ORDER — DEXAMETHASONE SODIUM PHOSPHATE 10 MG/ML IJ SOLN
10.0000 mg | Freq: Once | INTRAMUSCULAR | Status: AC
  Administered 2022-12-20: 10 mg via INTRAVENOUS

## 2022-12-20 MED ORDER — ACETAMINOPHEN 500 MG PO TABS
1000.0000 mg | ORAL_TABLET | Freq: Three times a day (TID) | ORAL | Status: AC
  Administered 2022-12-20 – 2022-12-21 (×4): 1000 mg via ORAL
  Filled 2022-12-20 (×4): qty 2

## 2022-12-20 MED ORDER — POLYETHYLENE GLYCOL 3350 17 G PO PACK
17.0000 g | PACK | Freq: Every day | ORAL | Status: DC
  Administered 2022-12-20 – 2022-12-24 (×4): 17 g via ORAL
  Filled 2022-12-20 (×6): qty 1

## 2022-12-20 MED ORDER — FENTANYL CITRATE (PF) 250 MCG/5ML IJ SOLN
INTRAMUSCULAR | Status: AC
  Filled 2022-12-20: qty 5

## 2022-12-20 MED ORDER — INSULIN ASPART 100 UNIT/ML IJ SOLN
0.0000 [IU] | Freq: Three times a day (TID) | INTRAMUSCULAR | Status: DC
  Administered 2022-12-21: 5 [IU] via SUBCUTANEOUS
  Administered 2022-12-22: 2 [IU] via SUBCUTANEOUS
  Administered 2022-12-23 – 2022-12-24 (×4): 3 [IU] via SUBCUTANEOUS
  Administered 2022-12-24: 8 [IU] via SUBCUTANEOUS
  Administered 2022-12-24: 2 [IU] via SUBCUTANEOUS
  Administered 2022-12-25: 3 [IU] via SUBCUTANEOUS

## 2022-12-20 MED ORDER — FENTANYL CITRATE (PF) 250 MCG/5ML IJ SOLN
INTRAMUSCULAR | Status: DC | PRN
  Administered 2022-12-20: 50 ug via INTRAVENOUS
  Administered 2022-12-20: 100 ug via INTRAVENOUS
  Administered 2022-12-20 (×2): 25 ug via INTRAVENOUS
  Administered 2022-12-20: 50 ug via INTRAVENOUS

## 2022-12-20 MED ORDER — ONDANSETRON HCL 4 MG/2ML IJ SOLN
INTRAMUSCULAR | Status: AC
  Filled 2022-12-20: qty 2

## 2022-12-20 MED ORDER — CHLORHEXIDINE GLUCONATE 0.12 % MT SOLN: 15.0000 mL | Freq: Once | OROMUCOSAL | Status: AC

## 2022-12-20 MED ORDER — VANCOMYCIN HCL 500 MG IV SOLR
INTRAVENOUS | Status: DC | PRN
  Administered 2022-12-20: 500 mg via TOPICAL

## 2022-12-20 MED ORDER — PRAVASTATIN SODIUM 40 MG PO TABS
40.0000 mg | ORAL_TABLET | Freq: Every day | ORAL | Status: DC
  Administered 2022-12-21 – 2022-12-25 (×5): 40 mg via ORAL
  Filled 2022-12-20 (×5): qty 1

## 2022-12-20 MED ORDER — THROMBIN 20000 UNITS EX SOLR
CUTANEOUS | Status: AC
  Filled 2022-12-20: qty 20000

## 2022-12-20 MED ORDER — TRANEXAMIC ACID-NACL 1000-0.7 MG/100ML-% IV SOLN
1000.0000 mg | Freq: Once | INTRAVENOUS | Status: AC
  Administered 2022-12-20: 1000 mg via INTRAVENOUS
  Filled 2022-12-20: qty 100

## 2022-12-20 MED ORDER — CEFAZOLIN SODIUM-DEXTROSE 2-4 GM/100ML-% IV SOLN
INTRAVENOUS | Status: AC
  Filled 2022-12-20: qty 100

## 2022-12-20 MED ORDER — OXYCODONE HCL 5 MG/5ML PO SOLN: 5.0000 mg | Freq: Once | ORAL | Status: DC | PRN

## 2022-12-20 MED ORDER — TRANEXAMIC ACID-NACL 1000-0.7 MG/100ML-% IV SOLN
1000.0000 mg | INTRAVENOUS | Status: AC
  Administered 2022-12-20: 1000 mg via INTRAVENOUS

## 2022-12-20 MED ORDER — ONDANSETRON HCL 4 MG/2ML IJ SOLN
INTRAMUSCULAR | Status: DC | PRN
  Administered 2022-12-20: 4 mg via INTRAVENOUS

## 2022-12-20 MED ORDER — LACTATED RINGERS IV SOLN: INTRAVENOUS | Status: DC

## 2022-12-20 MED ORDER — EPHEDRINE SULFATE-NACL 50-0.9 MG/10ML-% IV SOSY
PREFILLED_SYRINGE | INTRAVENOUS | Status: DC | PRN
  Administered 2022-12-20: 10 mg via INTRAVENOUS
  Administered 2022-12-20 (×2): 5 mg via INTRAVENOUS
  Administered 2022-12-20: 10 mg via INTRAVENOUS

## 2022-12-20 MED ORDER — EPINEPHRINE PF 1 MG/ML IJ SOLN
INTRAMUSCULAR | Status: AC
  Filled 2022-12-20: qty 1

## 2022-12-20 MED ORDER — PROMETHAZINE HCL 25 MG/ML IJ SOLN: 6.2500 mg | INTRAMUSCULAR | Status: DC | PRN

## 2022-12-20 MED ORDER — LIDOCAINE 2% (20 MG/ML) 5 ML SYRINGE
INTRAMUSCULAR | Status: AC
  Filled 2022-12-20: qty 5

## 2022-12-20 MED ORDER — EPHEDRINE 5 MG/ML INJ
INTRAVENOUS | Status: AC
  Filled 2022-12-20: qty 5

## 2022-12-20 SURGICAL SUPPLY — 59 items
BUR NEURO DRILL SOFT 3.0X3.8M (BURR) ×2 IMPLANT
CANISTER SUCT 3000ML PPV (MISCELLANEOUS) ×2 IMPLANT
CLSR STERI-STRIP ANTIMIC 1/2X4 (GAUZE/BANDAGES/DRESSINGS) IMPLANT
CORD BIPOLAR FORCEPS 12FT (ELECTRODE) ×2 IMPLANT
COVER MAYO STAND STRL (DRAPES) ×2 IMPLANT
COVER SURGICAL LIGHT HANDLE (MISCELLANEOUS) ×2 IMPLANT
DRAPE C-ARM 42X72 X-RAY (DRAPES) ×2 IMPLANT
DRAPE MICROSCOPE LEICA 54X105 (DRAPES) ×2 IMPLANT
DRAPE UTILITY XL STRL (DRAPES) ×2 IMPLANT
DRESSING MEPILEX FLEX 4X4 (GAUZE/BANDAGES/DRESSINGS) ×2 IMPLANT
DRSG MEPILEX FLEX 4X4 (GAUZE/BANDAGES/DRESSINGS)
DRSG MEPILEX POST OP 4X8 (GAUZE/BANDAGES/DRESSINGS) IMPLANT
DRSG TEGADERM 4X10 (GAUZE/BANDAGES/DRESSINGS) ×2 IMPLANT
DRSG TEGADERM 4X4.75 (GAUZE/BANDAGES/DRESSINGS) IMPLANT
DURAPREP 26ML APPLICATOR (WOUND CARE) ×2 IMPLANT
DURASEAL SPINE SEALANT 3ML (MISCELLANEOUS) IMPLANT
DURASEAL SPINE SEALANT 5 POLY (MISCELLANEOUS) IMPLANT
ELECT BLADE 4.0 EZ CLEAN MEGAD (MISCELLANEOUS) ×1
ELECT COATED BLADE 2.86 ST (ELECTRODE) IMPLANT
ELECT PENCIL ROCKER SW 15FT (MISCELLANEOUS) ×2 IMPLANT
ELECT REM PT RETURN 9FT ADLT (ELECTROSURGICAL) ×1
ELECTRODE BLDE 4.0 EZ CLN MEGD (MISCELLANEOUS) ×2 IMPLANT
ELECTRODE REM PT RTRN 9FT ADLT (ELECTROSURGICAL) ×2 IMPLANT
EVACUATOR 1/8 PVC DRAIN (DRAIN) IMPLANT
GAUZE SPONGE 4X4 12PLY STRL (GAUZE/BANDAGES/DRESSINGS) IMPLANT
GLOVE BIO SURGEON STRL SZ7.5 (GLOVE) ×2 IMPLANT
GLOVE INDICATOR 7.5 STRL GRN (GLOVE) ×2 IMPLANT
GOWN STRL REUS W/ TWL LRG LVL3 (GOWN DISPOSABLE) ×2 IMPLANT
GOWN STRL REUS W/TWL LRG LVL3 (GOWN DISPOSABLE) ×1
GOWN STRL SURGICAL XL XLNG (GOWN DISPOSABLE) ×2 IMPLANT
GRAFT DURAGEN MATRIX 1WX1L (Tissue) IMPLANT
KIT BASIN OR (CUSTOM PROCEDURE TRAY) ×2 IMPLANT
KIT POSITION SURG JACKSON T1 (MISCELLANEOUS) ×2 IMPLANT
KIT TURNOVER KIT B (KITS) ×2 IMPLANT
NDL 18GX1X1/2 (RX/OR ONLY) (NEEDLE) IMPLANT
NDL 22X1.5 STRL (OR ONLY) (MISCELLANEOUS) ×2 IMPLANT
NEEDLE 18GX1X1/2 (RX/OR ONLY) (NEEDLE) ×1 IMPLANT
NEEDLE 22X1.5 STRL (OR ONLY) (MISCELLANEOUS) IMPLANT
NS IRRIG 1000ML POUR BTL (IV SOLUTION) ×2 IMPLANT
PACK LAMINECTOMY ORTHO (CUSTOM PROCEDURE TRAY) ×2 IMPLANT
PATTIES SURGICAL .5 X.5 (GAUZE/BANDAGES/DRESSINGS) ×2 IMPLANT
SPONGE SURGIFOAM ABS GEL 100 (HEMOSTASIS) ×2 IMPLANT
SPONGE T-LAP 4X18 ~~LOC~~+RFID (SPONGE) ×2 IMPLANT
SUCTION FRAZIER HANDLE 10FR (MISCELLANEOUS) ×1
SUCTION TUBE FRAZIER 10FR DISP (MISCELLANEOUS) ×2 IMPLANT
SUT BONE WAX W31G (SUTURE) ×2 IMPLANT
SUT MNCRL AB 3-0 PS2 18 (SUTURE) IMPLANT
SUT MNCRL+ AB 3-0 CT1 36 (SUTURE) ×2 IMPLANT
SUT MONOCRYL AB 3-0 CT1 36IN (SUTURE) ×1
SUT PROLENE 6 0 PC 1 (SUTURE) IMPLANT
SUT VIC AB 0 CT1 18XCR BRD8 (SUTURE) ×2 IMPLANT
SUT VIC AB 0 CT1 8-18 (SUTURE) ×1
SUT VIC AB 2-0 CT1 18 (SUTURE) ×2 IMPLANT
SYR BULB IRRIG 60ML STRL (SYRINGE) ×2 IMPLANT
SYR CONTROL 10ML LL (SYRINGE) ×2 IMPLANT
SYR TB 1ML LUER SLIP (SYRINGE) IMPLANT
TOWEL GREEN STERILE (TOWEL DISPOSABLE) ×2 IMPLANT
TOWEL GREEN STERILE FF (TOWEL DISPOSABLE) ×2 IMPLANT
WATER STERILE IRR 1000ML POUR (IV SOLUTION) ×2 IMPLANT

## 2022-12-20 NOTE — Progress Notes (Addendum)
Orthopedic Surgery Post-operative Progress Note  Assessment: Patient is a 89 y.o. male who is currently admitted after undergoing L3-5 laminectomies complicated by intra-operative dural tear s/p repair   Plan: -Operative plans complete -Right (deep) drain to gravity, left drain to suction -Drains to be maintained until output slows -Bed flat overnight, will gradually raise head of bed starting tomorrow -No bending/lifting/twisting greater than 10 pounds -Pain control -Diabetic diet -No chemoprophylaxis for dvt or antiplatelets for 72 hours after surgery -Ancef x2 post-operative doses -Disposition: admit to floor from PACU  Hypothyroidism -continue home levothyroxine  HTN -continue home amlodipine and losartan  HLD -continue home pravastatin  CAD -hold home plavix until 72 hours post-op  DM  -sliding scale insulin ___________________________________________________________________________   Subjective: No acute events since surgery. Recovering in PACU. No leg pain. Having back pain but medications are helping. No headaches.   Objective:  General: no acute distress, appropriate affect Neurologic: alert, answering questions appropriately, following commands Respiratory: unlabored breathing on room air Skin: dressing clear/dry/intact, drains with sanguinous output  MSK (spine):  -Strength exam      Right  Left  EHL    5/5  5/5 TA    5/5  5/5 GSC    5/5  5/5 Knee extension  5/5  5/5 Hip flexion   5/5  5/5  -Sensory exam    Sensation intact to light touch in L3-S1 nerve distributions of bilateral lower extremities   Patient name: [name] Patient MRN: [id number] Date: 12/20/22

## 2022-12-20 NOTE — Brief Op Note (Signed)
12/20/2022  4:58 PM  PATIENT:  [name]  89 y.o. male  PRE-OPERATIVE DIAGNOSIS:  LUMBAR STENOSIS WITH NEUROGENIC CLAUDICATION  POST-OPERATIVE DIAGNOSIS:  LUMBAR STENOSIS WITH NEUROGENIC CLAUDICATION  PROCEDURE:  Procedure(s): LUMBAR THREE THROUGH FIVE LAMINECTOMIES AND FORAMINOTOMIES (N/A)  SURGEON:  Surgeon(s) and Role:    Callie Fielding, MD - Primary  PHYSICIAN ASSISTANT:   ASSISTANTS: none   ANESTHESIA:   general  EBL:  125 mL   BLOOD ADMINISTERED:none  DRAINS:  1 deep hemovac in the back, 1 superficial hemovac in the back    LOCAL MEDICATIONS USED:  NONE  SPECIMEN:  No Specimen  DISPOSITION OF SPECIMEN:  N/A  COUNTS:  YES  TOURNIQUET: None  DICTATION: .Note written in EPIC  PLAN OF CARE: Admit for overnight observation  PATIENT DISPOSITION:  PACU - hemodynamically stable.   Delay start of Pharmacological VTE agent (>24hrs) due to surgical blood loss or risk of bleeding: yes

## 2022-12-20 NOTE — Transfer of Care (Signed)
Immediate Anesthesia Transfer of Care Note  Patient: [name]  Procedure(s) Performed: LUMBAR THREE THROUGH FIVE LAMINECTOMIES AND FORAMINOTOMIES (Spine Lumbar)  Patient Location: PACU  Anesthesia Type:General  Level of Consciousness: drowsy, patient cooperative, and responds to stimulation  Airway & Oxygen Therapy: Patient Spontanous Breathing and Patient connected to face mask oxygen  Post-op Assessment: Report given to RN and Post -op Vital signs reviewed and stable  Post vital signs: Reviewed and stable  Last Vitals:  Vitals Value Taken Time  BP    Temp    Pulse 84 12/20/22 1658  Resp 15 12/20/22 1658  SpO2 99 % 12/20/22 1658  Vitals shown include unvalidated device data.  Last Pain:  Vitals:   12/20/22 0915  TempSrc:   PainSc: 2          Complications: No notable events documented.

## 2022-12-20 NOTE — Op Note (Addendum)
Orthopedic Spine Surgery Operative Report  Procedure: L3, L4, L5 lumbar laminectomy with medial facetectomies and foraminotomies  Modifier: none  Date of procedure: 12/20/2022  Patient name: [name] MRN: [id number] DOB: [birth year]  Surgeon: Ileene Rubens, MD Assistant: None Pre-operative diagnosis: lumbar stenosis with neurogenic claudication Post-operative diagnosis: same as above Findings: L3/4 and L4/5 hypertrophic facets and thickened ligamentum flavum  Specimens: none Anesthesia: general EBL: [id number] Complications: durotomy s/p repair Pre-incision antibiotic: ancef  Implants: none   Indication for procedure: Patient is a 89 y.o. male who presented to the office with symptoms consistent with neurogenic claudication due to lumbar stenosis. The patient had tried conservative treatments that did not provide any lasting relief. As result, operative management was discussed. The pre-operative MRI showed stenosis from L3-L5 so a L3-5 laminectomy was presented as a treatment option. The risks including but not limited to iatrogenic instability, dural tear, nerve root injury, paralysis, persistent pain, infection, bleeding, heart attack, death, stroke, fracture, blindness, and need for additional procedures were discussed with the patient. The benefit of the surgery would be relief of the patient's leg pain. The alternatives to surgical management were covered with the patient and included continued monitoring, physical therapy, over-the-counter pain medications, ambulatory aids, and activity modification. All the patient's questions were answered to their satisfaction. After this discussion, the patient expressed understanding and elected to proceed with surgical intervention.   Procedure Description: The patient was met in the pre-operative holding area. The patient's identity and consent were verified. The operative site was marked. The patient's remaining questions about the  surgery were answered. The patient was brought back to the operating room. General anesthesia was induced and an endotracheal tube was placed by the anesthesia staff. The patient was transferred to the prone Pierpont table in the prone position. All bony prominences were well padded. The head of the bed was slightly elevated and the eyes were free from compression by the face pillow. An electric razor was used to remove his hair over the lumbar region. The surgical area was cleansed with alcohol. Fluoroscopy was then brought in to check rotation on the AP image and to mark the levels on the lateral image. The patient's skin was then prepped and draped in a standard, sterile fashion. A time out was performed that identified the patient, the procedure, and the operative levels. All team members agreed with what was stated in the time out.   A midline incision over the spinous processes of the previously marked levels was made and sharp dissection was continued down through the skin and dermis. Electrocautery was then used to continue the midline dissection down to the level of the spinous process. Subperiosteal dissection was performed using electrocautery to expose the lamina out lateral to the facet joint capsule. Care was taken to not violate the facet joint capsules. A lateral fluoroscopic image was taken to confirm the level. Subperiosteal dissection with electrocautery was then done to expose all the lamina and pars interarticularis of L3, L4, and L5. Again, care was taken to avoid disruption of the facet capsules.    A rongeur was used to remove the interspinous ligament between L2/3 and the cranial aspect of the L5 spinous process. Rongeur was used to remove the spinous process and remaining interspinous ligament between those points. Bone wax was used to obtain hemostasis at the bleeding bony surfaces. A high-speed burr was used to thin the lamina of L3, L4, and L5. Above the level of the ligamentum, the  lamina  was thinned with the burr to the approximate level of the ligamentum. Care was taken to leave at least 60m of pars interarticularis on each side. A series of Kerrison rongeurs were used to remove the remaining lamina and ligamentum overlying the thecal sac. The kerrisons were also used to remove the medial aspect of the facets at L3/4 and L4/5 bilaterally. While using the kerrison near the axilla of the right L4 nerve, an iatrogenic durotomy occurred while using the kerrison. No other dural tears were encountered. A piece of gel foam was placed over the durotomy. Next, a woodsen was placed into the right foramen at L3/4 and a kerrison was placed over it to remove the bone and soft tissue overlying the nerve root. The same process was repeated for the left L3/4 foramen and the bilateral L4/5 foramen.   A woodsen was placed into the laminectomy site to palpate for any remaining areas of stenosis. Once it was confirmed with the woodsen that decompression had been completed from the medial pedicle wall to the contralateral medial pedicle wall from the pedicle of L3 to the pedicle of L5, decompression was determined to be completed. A lateral fluoroscopic film was taken with one instrument in the cranial aspect of the laminectomy and one in the caudal aspect to demonstrate the levels decompressed.   Next, attention was turned to the durotomy. A 6-0 prolene was used in a simple, interrupted fashion to close the durotomy. A Valsalva maneuver was performed by anesthesia and no CSF was seen coming from the durotomy site. A duragen patch was placed over the repair and duraseal exact was placed over the site as well. No CSF was seen leaking at this point either.   5068mof vancomycin powder was placed into the wound. A medium hemovac drain was placed deep to the fascia. The fascia was reapproximated with 0 vicryl suture. A subcutaneous hemovac drain was placed above the fascia. The subcutaneous fat was  reapproximated with 0 vicryl suture. The deep dermal layer was reapproximated with 2-0 viryl. The skin as closed with a 3-0 running moncryl. All counts were correct at the end of the case. The incision was dressed with steri strips and mastisol. An island dressing was placed over the wound. The patient was transferred back to a bed and brought to the post-anesthesia care unit by anesthesia staff in stable condition.  Post-operative plan: The patient will recover in the post-anesthesia care unit and then go to the floor. The patient will receive two post-operative doses of ancef. The patient will be bed rest overnight and his head of bed will be gradually increased tomorrow. If he continues to progress without headaches, will eventually have him ambulate. The drains may be removed depending on the output. The deep drain will be to gravity only and the superficial drain will be to suction. The patient's disposition will be determined based off how he does with gradual head of bed raises and ambulation.   MiIleene RubensMD Orthopedic Surgeon

## 2022-12-20 NOTE — Anesthesia Procedure Notes (Signed)
Procedure Name: Intubation Date/Time: 12/20/2022 11:31 AM  Performed by: Michele Rockers, CRNAPre-anesthesia Checklist: Patient identified, Patient being monitored, Timeout performed, Emergency Drugs available and Suction available Patient Re-evaluated:Patient Re-evaluated prior to induction Oxygen Delivery Method: Circle System Utilized Preoxygenation: Pre-oxygenation with 100% oxygen Induction Type: IV induction Ventilation: Mask ventilation without difficulty Laryngoscope Size: Mac and 3 Grade View: Grade I Tube type: Oral Tube size: 7.0 mm Number of attempts: 1 Airway Equipment and Method: Stylet Placement Confirmation: ETT inserted through vocal cords under direct vision, positive ETCO2 and breath sounds checked- equal and bilateral Secured at: 22 cm Tube secured with: Tape Dental Injury: Teeth and Oropharynx as per pre-operative assessment  Comments: Patient intubated by EMT student, supervised by Dr. Sabra Heck

## 2022-12-20 NOTE — H&P (Signed)
Orthopedic Spine Surgery H&P Note  Assessment: Patient is a 89 y.o. male with lumbar stenosis and neurogenic claudication   Plan: -Out of bed as tolerated, activity as tolerated, no brace -Covered the risks of surgery one more time with the patient and patient elected to proceed with planned surgery -Written consent verified -Hold anticoagulation in anticipation of surgery -Ancef on all to OR -NPO for procedure -Site marked -To OR when ready  The patient has symptoms consistent with lumbar stenosis. The patient's symptoms were not getting improvement with conservative treatment so operative management was discussed in the form of L3-5 laminectomies. The risks including but not limited to iatrogenic instability, dural tear, nerve root injury, paralysis, persistent pain, infection, bleeding, heart attack, death, stroke, fracture, blindness, and need for additional procedures were discussed with the patient. The benefit of the surgery would be improvement in the patient's symptoms related to the lumbar stenosis which would be the leg pain. I explained that back pain relief is not the goal of the surgery and it is not reliably alleviated with this surgery. The alternatives to surgical management were covered with the patient and included continued monitoring, physical therapy, over-the-counter pain medications, ambulatory aids, and activity modification. All the patient's questions were answered to their satisfaction. After this discussion, the patient expressed understanding and elected to proceed with surgical intervention.     ___________________________________________________________________________  Chief Complaint: bilateral leg pain  History: Patient is 89 y.o. male who has been previously seen in the office for lumbar stenosis with neurogenic claudication. His symptoms failed to improve with conservative treatment so operative management was discussed at the last office visit. The patient  presents today for surgery with no changes in his symptoms since the last office visit. See previous office note for further details.    Review of systems: General: denies fevers and chills, myalgias Neurologic: denies recent changes in vision, slurred speech Abdomen: denies nausea, vomiting, hematemesis Respiratory: denies cough, shortness of breath  Past medical history: CAD HTN Hypothyroidism DM CKD HLD   Allergies: NKDA   Past surgical history:  Appendectomy [facility]   Social history: Denies use of nicotine product (smoking, vaping, patches, smokeless) Denies recreational drug use Still works a the [employer], helping with the condiments Goes to the gym regularly  Family history: -reviewed and not pertinent to lumbar stenosis with neurogenic claudication   Physical Exam:  General: no acute distress, appears stated age Neurologic: alert, answering questions appropriately, following commands Cardiovascular: regular rate, no cyanosis Respiratory: unlabored breathing on room air, symmetric chest rise Psychiatric: appropriate affect, normal cadence to speech   MSK (spine):  -Strength exam      Left  Right  EHL    4/5  5/5 TA    5/5  5/5 GSC    5/5  5/5 Knee extension  5/5  5/5 Knee flexion   5/5  5/5 Hip flexion   5/5  5/5  -Sensory exam    Sensation intact to light touch in L3-S1 nerve distributions of bilateral lower extremities   Patient name: [name] Patient MRN: [id number] Date: 12/20/22

## 2022-12-21 DIAGNOSIS — I2510 Atherosclerotic heart disease of native coronary artery without angina pectoris: Secondary | ICD-10-CM | POA: Diagnosis present

## 2022-12-21 DIAGNOSIS — I10 Essential (primary) hypertension: Secondary | ICD-10-CM | POA: Diagnosis present

## 2022-12-21 DIAGNOSIS — E039 Hypothyroidism, unspecified: Secondary | ICD-10-CM | POA: Diagnosis present

## 2022-12-21 DIAGNOSIS — G9741 Accidental puncture or laceration of dura during a procedure: Secondary | ICD-10-CM | POA: Diagnosis not present

## 2022-12-21 DIAGNOSIS — G9600 Cerebrospinal fluid leak, unspecified: Secondary | ICD-10-CM | POA: Diagnosis not present

## 2022-12-21 DIAGNOSIS — Y838 Other surgical procedures as the cause of abnormal reaction of the patient, or of later complication, without mention of misadventure at the time of the procedure: Secondary | ICD-10-CM | POA: Diagnosis not present

## 2022-12-21 DIAGNOSIS — Z87891 Personal history of nicotine dependence: Secondary | ICD-10-CM | POA: Diagnosis not present

## 2022-12-21 DIAGNOSIS — I252 Old myocardial infarction: Secondary | ICD-10-CM | POA: Diagnosis not present

## 2022-12-21 DIAGNOSIS — M48062 Spinal stenosis, lumbar region with neurogenic claudication: Secondary | ICD-10-CM | POA: Diagnosis present

## 2022-12-21 DIAGNOSIS — E785 Hyperlipidemia, unspecified: Secondary | ICD-10-CM | POA: Diagnosis present

## 2022-12-21 DIAGNOSIS — R079 Chest pain, unspecified: Secondary | ICD-10-CM | POA: Diagnosis not present

## 2022-12-21 LAB — CBC
HCT: 40.3 % (ref 39.0–52.0)
HCT: 40.5 % (ref 39.0–52.0)
Hemoglobin: 13.7 g/dL (ref 13.0–17.0)
Hemoglobin: 13.6 g/dL (ref 13.0–17.0)
MCH: 32.5 pg (ref 26.0–34.0)
MCH: 32.2 pg (ref 26.0–34.0)
MCHC: 34 g/dL (ref 30.0–36.0)
MCHC: 33.6 g/dL (ref 30.0–36.0)
MCV: 95.7 fL (ref 80.0–100.0)
MCV: 95.7 fL (ref 80.0–100.0)
Platelets: 248 10*3/uL (ref 150–400)
Platelets: 265 10*3/uL (ref 150–400)
RBC: 4.21 MIL/uL — ABNORMAL LOW (ref 4.22–5.81)
RBC: 4.23 MIL/uL (ref 4.22–5.81)
RDW: 12.5 % (ref 11.5–15.5)
RDW: 12.5 % (ref 11.5–15.5)
WBC: 14.6 10*3/uL — ABNORMAL HIGH (ref 4.0–10.5)
WBC: 16.1 10*3/uL — ABNORMAL HIGH (ref 4.0–10.5)
nRBC: 0 % (ref 0.0–0.2)
nRBC: 0 % (ref 0.0–0.2)

## 2022-12-21 LAB — BASIC METABOLIC PANEL
Anion gap: 11 (ref 5–15)
BUN: 20 mg/dL (ref 8–23)
CO2: 23 mmol/L (ref 22–32)
Calcium: 8.8 mg/dL — ABNORMAL LOW (ref 8.9–10.3)
Chloride: 101 mmol/L (ref 98–111)
Creatinine, Ser: 1.35 mg/dL — ABNORMAL HIGH (ref 0.61–1.24)
GFR, Estimated: 50 mL/min — ABNORMAL LOW (ref >60)
Glucose, Bld: 178 mg/dL — ABNORMAL HIGH (ref 70–99)
Potassium: 5 mmol/L (ref 3.5–5.1)
Sodium: 135 mmol/L (ref 135–145)

## 2022-12-21 LAB — GLUCOSE, CAPILLARY
Glucose-Capillary: 166 mg/dL — ABNORMAL HIGH (ref 70–99)
Glucose-Capillary: 181 mg/dL — ABNORMAL HIGH (ref 70–99)
Glucose-Capillary: 225 mg/dL — ABNORMAL HIGH (ref 70–99)
Glucose-Capillary: 120 mg/dL — ABNORMAL HIGH (ref 70–99)

## 2022-12-21 MED ORDER — CHLORHEXIDINE GLUCONATE CLOTH 2 % EX PADS
6.0000 | MEDICATED_PAD | Freq: Every day | CUTANEOUS | Status: DC
  Administered 2022-12-21 – 2022-12-24 (×4): 6 via TOPICAL

## 2022-12-21 MED ORDER — CAFFEINE 200 MG PO TABS
200.0000 mg | ORAL_TABLET | Freq: Once | ORAL | Status: AC
  Administered 2022-12-21: 200 mg via ORAL
  Filled 2022-12-21: qty 1

## 2022-12-21 NOTE — Progress Notes (Signed)
Orthopedic Surgery Post-operative Progress Note  Assessment: Patient is a 89 y.o. male who is currently admitted after undergoing L3-5 laminectomies complicated by intra-operative dural tear s/p repair   Plan: -Operative plans complete -Right (deep) drain to gravity, left drain to suction -Drains to be maintained until output slows -Head of bed from 0-30 today -Will try to gradually raise HOB tomorrow  -No bending/lifting/twisting greater than 10 pounds -Pain control -Diabetic diet -No chemoprophylaxis for dvt or antiplatelets for 72 hours after surgery -Ancef x2 post-operative doses -Disposition: remain floor status  Hypothyroidism -continue home levothyroxine  HTN -continue home amlodipine and losartan  HLD -continue home pravastatin  CAD -hold home plavix until 72 hours post-op  DM  -sliding scale insulin -diabetic diet  CKD -creatinine was 1.36 pre-op, it is 1.35 this morning ___________________________________________________________________________   Subjective: Head of bed was gradually raised today but then he developed headache around 70 degrees. Headache resolved when he went back down to 30 degrees. No other events since seen this morning. Not having any leg pain. Back pain well controlled.   Objective:  General: no acute distress, appropriate affect Neurologic: alert, answering questions appropriately, following commands Respiratory: unlabored breathing on room air Skin: dressing clear/dry/intact, drains with sanguinous output (no clear fluid seen in the deep drain)  MSK (spine):  -Strength exam      Right  Left  EHL    5/5  5/5 TA    5/5  5/5 GSC    5/5  5/5 Knee extension  5/5  5/5 Hip flexion   5/5  5/5  -Sensory exam    Sensation intact to light touch in L3-S1 nerve distributions of bilateral lower extremities   Patient name: [name] Patient MRN: [id number] Date: 12/21/22

## 2022-12-21 NOTE — Progress Notes (Signed)
Orthopedic Surgery Post-operative Progress Note  Assessment: Patient is a 89 y.o. male who is currently admitted after undergoing L3-5 laminectomies complicated by intra-operative dural tear s/p repair   Plan: -Operative plans complete -Right (deep) drain to gravity, left drain to suction -Drains to be maintained until output slows -Gradually start raising head of bed today -No bending/lifting/twisting greater than 10 pounds -Pain control -Diabetic diet -No chemoprophylaxis for dvt or antiplatelets for 72 hours after surgery -Ancef x2 post-operative doses -Disposition: remain floor status  Hypothyroidism -continue home levothyroxine  HTN -continue home amlodipine and losartan  HLD -continue home pravastatin  CAD -hold home plavix until 72 hours post-op  DM  -sliding scale insulin -diabetic diet  CKD -creatinine was 1.36 pre-op, it is 1.35 this morning ___________________________________________________________________________   Subjective: No acute events overnight. Was able to some sleep last night. Having pain in his back. No leg pain. Denies paresthesias and numbness. No headaches.   After raising head of bed to 30 degrees for 49mn, no headaches.  Objective:  General: no acute distress, appropriate affect Neurologic: alert, answering questions appropriately, following commands Respiratory: unlabored breathing on room air Skin: dressing clear/dry/intact, drains with sanguinous output (no clear fluid seen in the deep drain)  MSK (spine):  -Strength exam      Right  Left  EHL    5/5  5/5 TA    5/5  5/5 GSC    5/5  5/5 Knee extension  5/5  5/5 Hip flexion   5/5  5/5  -Sensory exam    Sensation intact to light touch in L3-S1 nerve distributions of bilateral lower extremities   Patient name: Gregory DirksPatient MRN: [id number]: 12/21/22

## 2022-12-21 NOTE — Progress Notes (Signed)
Patient reported he had some some chest discomfort/ pain around 18:00 - 18:30. Per patient, discomfort resolved after 30 minutes or so. Patient also reported hx of heart murmur, stent and a previous heart attack. Informed on-call. Telemetry order placed.

## 2022-12-21 NOTE — Progress Notes (Signed)
Pt refused coverage from his sliding scale for BF and lunch he stated " I don't use insulin at home I don't want it here. Pt last CBG 225 and I reeducated he agreed to get his dinner coverage. MD

## 2022-12-21 NOTE — Anesthesia Postprocedure Evaluation (Signed)
Anesthesia Post Note  Patient: [name]  Procedure(s) Performed: LUMBAR THREE THROUGH FIVE LAMINECTOMIES AND FORAMINOTOMIES (Spine Lumbar)     Patient location during evaluation: PACU Anesthesia Type: General Level of consciousness: awake and alert Pain management: pain level controlled Vital Signs Assessment: post-procedure vital signs reviewed and stable Respiratory status: spontaneous breathing, nonlabored ventilation and respiratory function stable Cardiovascular status: blood pressure returned to baseline and stable Postop Assessment: no apparent nausea or vomiting Anesthetic complications: no   No notable events documented.  Last Vitals:  Vitals:   12/21/22 0542 12/21/22 0735  BP: (!) 140/59 (!) 128/44  Pulse: 68 62  Resp: 16 18  Temp: 36.7 C 36.7 C  SpO2: 98% 100%    Last Pain:  Vitals:   12/21/22 0735  TempSrc: Oral  PainSc:                  Lynda Rainwater

## 2022-12-22 ENCOUNTER — Inpatient Hospital Stay (HOSPITAL_COMMUNITY): Payer: Medicare Other | Admitting: Anesthesiology

## 2022-12-22 ENCOUNTER — Inpatient Hospital Stay (HOSPITAL_COMMUNITY)
Admission: RE | Disposition: A | Discharge status: Home Health | Payer: Self-pay | Source: Ambulatory Visit | Attending: Orthopedic Surgery

## 2022-12-22 ENCOUNTER — Encounter (HOSPITAL_COMMUNITY): Payer: Self-pay | Admitting: Orthopedic Surgery

## 2022-12-22 ENCOUNTER — Other Ambulatory Visit: Payer: Self-pay

## 2022-12-22 DIAGNOSIS — G9741 Accidental puncture or laceration of dura during a procedure: Secondary | ICD-10-CM

## 2022-12-22 DIAGNOSIS — G9600 Cerebrospinal fluid leak, unspecified: Secondary | ICD-10-CM

## 2022-12-22 DIAGNOSIS — Z87891 Personal history of nicotine dependence: Secondary | ICD-10-CM

## 2022-12-22 DIAGNOSIS — I2510 Atherosclerotic heart disease of native coronary artery without angina pectoris: Secondary | ICD-10-CM

## 2022-12-22 DIAGNOSIS — I10 Essential (primary) hypertension: Secondary | ICD-10-CM

## 2022-12-22 HISTORY — PX: LUMBAR LAMINECTOMY/DECOMPRESSION MICRODISCECTOMY: SHX5026

## 2022-12-22 LAB — BASIC METABOLIC PANEL
Anion gap: 10 (ref 5–15)
BUN: 23 mg/dL (ref 8–23)
CO2: 25 mmol/L (ref 22–32)
Calcium: 9.3 mg/dL (ref 8.9–10.3)
Chloride: 102 mmol/L (ref 98–111)
Creatinine, Ser: 1.4 mg/dL — ABNORMAL HIGH (ref 0.61–1.24)
GFR, Estimated: 48 mL/min — ABNORMAL LOW (ref >60)
Glucose, Bld: 139 mg/dL — ABNORMAL HIGH (ref 70–99)
Potassium: 4.7 mmol/L (ref 3.5–5.1)
Sodium: 137 mmol/L (ref 135–145)

## 2022-12-22 LAB — TROPONIN I (HIGH SENSITIVITY)
Troponin I (High Sensitivity): 20 ng/L — ABNORMAL HIGH (ref <18)
Troponin I (High Sensitivity): 19 ng/L — ABNORMAL HIGH (ref <18)
Troponin I (High Sensitivity): 15 ng/L (ref <18)
Troponin I (High Sensitivity): 15 ng/L (ref <18)

## 2022-12-22 LAB — GLUCOSE, CAPILLARY
Glucose-Capillary: 138 mg/dL — ABNORMAL HIGH (ref 70–99)
Glucose-Capillary: 115 mg/dL — ABNORMAL HIGH (ref 70–99)
Glucose-Capillary: 156 mg/dL — ABNORMAL HIGH (ref 70–99)
Glucose-Capillary: 214 mg/dL — ABNORMAL HIGH (ref 70–99)

## 2022-12-22 SURGERY — LUMBAR LAMINECTOMY/DECOMPRESSION MICRODISCECTOMY 1 LEVEL
Anesthesia: General

## 2022-12-22 MED ORDER — LIDOCAINE 2% (20 MG/ML) 5 ML SYRINGE
INTRAMUSCULAR | Status: AC
  Filled 2022-12-22: qty 5

## 2022-12-22 MED ORDER — PROPOFOL 10 MG/ML IV BOLUS
INTRAVENOUS | Status: DC | PRN
  Administered 2022-12-22: 70 mg via INTRAVENOUS

## 2022-12-22 MED ORDER — CHLORHEXIDINE GLUCONATE 0.12 % MT SOLN
OROMUCOSAL | Status: AC
  Administered 2022-12-22: 15 mL via OROMUCOSAL
  Filled 2022-12-22: qty 15

## 2022-12-22 MED ORDER — 0.9 % SODIUM CHLORIDE (POUR BTL) OPTIME
TOPICAL | Status: DC | PRN
  Administered 2022-12-22: 1000 mL

## 2022-12-22 MED ORDER — THROMBIN (RECOMBINANT) 20000 UNITS EX SOLR
CUTANEOUS | Status: AC
  Filled 2022-12-22: qty 20000

## 2022-12-22 MED ORDER — BUPIVACAINE HCL (PF) 0.25 % IJ SOLN
INTRAMUSCULAR | Status: AC
  Filled 2022-12-22: qty 30

## 2022-12-22 MED ORDER — CEFAZOLIN SODIUM-DEXTROSE 2-4 GM/100ML-% IV SOLN
2.0000 g | INTRAVENOUS | Status: AC
  Administered 2022-12-22: 2 g via INTRAVENOUS

## 2022-12-22 MED ORDER — LIDOCAINE 2% (20 MG/ML) 5 ML SYRINGE
INTRAMUSCULAR | Status: DC | PRN
  Administered 2022-12-22: 40 mg via INTRAVENOUS

## 2022-12-22 MED ORDER — VANCOMYCIN HCL 1000 MG IV SOLR
INTRAVENOUS | Status: AC
  Filled 2022-12-22: qty 20

## 2022-12-22 MED ORDER — TRANEXAMIC ACID-NACL 1000-0.7 MG/100ML-% IV SOLN
INTRAVENOUS | Status: AC
  Filled 2022-12-22: qty 100

## 2022-12-22 MED ORDER — DEXAMETHASONE SODIUM PHOSPHATE 10 MG/ML IJ SOLN
INTRAMUSCULAR | Status: AC
  Filled 2022-12-22: qty 1

## 2022-12-22 MED ORDER — TRANEXAMIC ACID-NACL 1000-0.7 MG/100ML-% IV SOLN: 1000.0000 mg | Freq: Once | INTRAVENOUS | Status: DC

## 2022-12-22 MED ORDER — TRANEXAMIC ACID-NACL 1000-0.7 MG/100ML-% IV SOLN
1000.0000 mg | INTRAVENOUS | Status: AC
  Administered 2022-12-22: 1000 mg via INTRAVENOUS

## 2022-12-22 MED ORDER — SUGAMMADEX SODIUM 200 MG/2ML IV SOLN
INTRAVENOUS | Status: DC | PRN
  Administered 2022-12-22: 150 mg via INTRAVENOUS

## 2022-12-22 MED ORDER — FENTANYL CITRATE (PF) 250 MCG/5ML IJ SOLN
INTRAMUSCULAR | Status: DC | PRN
  Administered 2022-12-22 (×2): 25 ug via INTRAVENOUS
  Administered 2022-12-22: 50 ug via INTRAVENOUS

## 2022-12-22 MED ORDER — DEXAMETHASONE SODIUM PHOSPHATE 10 MG/ML IJ SOLN
INTRAMUSCULAR | Status: DC | PRN
  Administered 2022-12-22: 4 mg via INTRAVENOUS

## 2022-12-22 MED ORDER — PHENYLEPHRINE 80 MCG/ML (10ML) SYRINGE FOR IV PUSH (FOR BLOOD PRESSURE SUPPORT)
PREFILLED_SYRINGE | INTRAVENOUS | Status: DC | PRN
  Administered 2022-12-22 (×2): 80 ug via INTRAVENOUS
  Administered 2022-12-22: 160 ug via INTRAVENOUS
  Administered 2022-12-22: 80 ug via INTRAVENOUS

## 2022-12-22 MED ORDER — METHYLPREDNISOLONE ACETATE 40 MG/ML IJ SUSP
INTRAMUSCULAR | Status: AC
  Filled 2022-12-22: qty 1

## 2022-12-22 MED ORDER — VANCOMYCIN HCL 1000 MG IV SOLR
INTRAVENOUS | Status: DC | PRN
  Administered 2022-12-22: 1000 mg via TOPICAL

## 2022-12-22 MED ORDER — CEFAZOLIN SODIUM-DEXTROSE 2-4 GM/100ML-% IV SOLN
2.0000 g | Freq: Three times a day (TID) | INTRAVENOUS | Status: AC
  Administered 2022-12-22 (×2): 2 g via INTRAVENOUS
  Filled 2022-12-22 (×2): qty 100

## 2022-12-22 MED ORDER — CEFAZOLIN SODIUM-DEXTROSE 2-4 GM/100ML-% IV SOLN
INTRAVENOUS | Status: AC
  Filled 2022-12-22: qty 100

## 2022-12-22 MED ORDER — THROMBIN 20000 UNITS EX SOLR
CUTANEOUS | Status: DC | PRN
  Administered 2022-12-22: 20 mL via TOPICAL

## 2022-12-22 MED ORDER — ROCURONIUM BROMIDE 10 MG/ML (PF) SYRINGE
PREFILLED_SYRINGE | INTRAVENOUS | Status: AC
  Filled 2022-12-22: qty 10

## 2022-12-22 MED ORDER — ROCURONIUM BROMIDE 10 MG/ML (PF) SYRINGE
PREFILLED_SYRINGE | INTRAVENOUS | Status: DC | PRN
  Administered 2022-12-22 (×2): 10 mg via INTRAVENOUS
  Administered 2022-12-22: 50 mg via INTRAVENOUS

## 2022-12-22 MED ORDER — ORAL CARE MOUTH RINSE: 15.0000 mL | Freq: Once | OROMUCOSAL | Status: AC

## 2022-12-22 MED ORDER — ONDANSETRON HCL 4 MG/2ML IJ SOLN
INTRAMUSCULAR | Status: DC | PRN
  Administered 2022-12-22: 4 mg via INTRAVENOUS

## 2022-12-22 MED ORDER — FENTANYL CITRATE (PF) 250 MCG/5ML IJ SOLN
INTRAMUSCULAR | Status: AC
  Filled 2022-12-22: qty 5

## 2022-12-22 MED ORDER — INSULIN ASPART 100 UNIT/ML IJ SOLN: 0.0000 [IU] | INTRAMUSCULAR | Status: DC | PRN

## 2022-12-22 MED ORDER — ONDANSETRON HCL 4 MG/2ML IJ SOLN
INTRAMUSCULAR | Status: AC
  Filled 2022-12-22: qty 2

## 2022-12-22 MED ORDER — LACTATED RINGERS IV SOLN: INTRAVENOUS | Status: DC

## 2022-12-22 MED ORDER — CHLORHEXIDINE GLUCONATE 0.12 % MT SOLN: 15.0000 mL | Freq: Once | OROMUCOSAL | Status: AC

## 2022-12-22 MED ORDER — PHENYLEPHRINE HCL-NACL 20-0.9 MG/250ML-% IV SOLN
INTRAVENOUS | Status: DC | PRN
  Administered 2022-12-22: 25 ug/min via INTRAVENOUS

## 2022-12-22 MED ORDER — PHENYLEPHRINE 80 MCG/ML (10ML) SYRINGE FOR IV PUSH (FOR BLOOD PRESSURE SUPPORT)
PREFILLED_SYRINGE | INTRAVENOUS | Status: AC
  Filled 2022-12-22: qty 10

## 2022-12-22 MED ORDER — SUCCINYLCHOLINE CHLORIDE 200 MG/10ML IV SOSY
PREFILLED_SYRINGE | INTRAVENOUS | Status: AC
  Filled 2022-12-22: qty 10

## 2022-12-22 MED ORDER — PROPOFOL 10 MG/ML IV BOLUS
INTRAVENOUS | Status: AC
  Filled 2022-12-22: qty 20

## 2022-12-22 MED ORDER — PHENYLEPHRINE HCL (PRESSORS) 10 MG/ML IV SOLN
INTRAVENOUS | Status: AC
  Filled 2022-12-22: qty 1

## 2022-12-22 MED ORDER — FENTANYL CITRATE (PF) 100 MCG/2ML IJ SOLN: 25.0000 ug | INTRAMUSCULAR | Status: DC | PRN

## 2022-12-22 SURGICAL SUPPLY — 60 items
APL SKNCLS STERI-STRIP NONHPOA (GAUZE/BANDAGES/DRESSINGS) ×1
BENZOIN TINCTURE PRP APPL 2/3 (GAUZE/BANDAGES/DRESSINGS) IMPLANT
BUR NEURO DRILL SOFT 3.0X3.8M (BURR) ×2 IMPLANT
CANISTER SUCT 3000ML PPV (MISCELLANEOUS) ×2 IMPLANT
CORD BIPOLAR FORCEPS 12FT (ELECTRODE) ×2 IMPLANT
COVER MAYO STAND STRL (DRAPES) ×2 IMPLANT
COVER SURGICAL LIGHT HANDLE (MISCELLANEOUS) ×2 IMPLANT
DRAPE C-ARM 42X72 X-RAY (DRAPES) ×2 IMPLANT
DRAPE MICROSCOPE LEICA 54X105 (DRAPES) ×2 IMPLANT
DRAPE UTILITY XL STRL (DRAPES) ×2 IMPLANT
DRESSING MEPILEX FLEX 4X4 (GAUZE/BANDAGES/DRESSINGS) ×2 IMPLANT
DRSG MEPILEX FLEX 4X4 (GAUZE/BANDAGES/DRESSINGS) ×1
DRSG MEPILEX POST OP 4X8 (GAUZE/BANDAGES/DRESSINGS) IMPLANT
DRSG TEGADERM 4X10 (GAUZE/BANDAGES/DRESSINGS) ×2 IMPLANT
DRSG TEGADERM 4X4.75 (GAUZE/BANDAGES/DRESSINGS) IMPLANT
DURAPREP 26ML APPLICATOR (WOUND CARE) ×2 IMPLANT
DURASEAL SPINE SEALANT 5 POLY (MISCELLANEOUS) IMPLANT
ELECT BLADE 4.0 EZ CLEAN MEGAD (MISCELLANEOUS) ×1
ELECT PENCIL ROCKER SW 15FT (MISCELLANEOUS) ×2 IMPLANT
ELECT REM PT RETURN 9FT ADLT (ELECTROSURGICAL) ×1
ELECTRODE BLDE 4.0 EZ CLN MEGD (MISCELLANEOUS) ×2 IMPLANT
ELECTRODE REM PT RTRN 9FT ADLT (ELECTROSURGICAL) ×2 IMPLANT
EVACUATOR 1/8 PVC DRAIN (DRAIN) IMPLANT
GAUZE SPONGE 4X4 12PLY STRL (GAUZE/BANDAGES/DRESSINGS) IMPLANT
GLOVE BIO SURGEON STRL SZ7.5 (GLOVE) ×2 IMPLANT
GLOVE INDICATOR 7.5 STRL GRN (GLOVE) ×2 IMPLANT
GOWN STRL REUS W/ TWL LRG LVL3 (GOWN DISPOSABLE) ×2 IMPLANT
GOWN STRL REUS W/TWL LRG LVL3 (GOWN DISPOSABLE) ×1
GOWN STRL SURGICAL XL XLNG (GOWN DISPOSABLE) ×2 IMPLANT
GRAFT DURAGEN MATRIX 3WX3L (Graft) ×1 IMPLANT
GRAFT DURAGEN MATRIX 3X3 SNGL (Graft) IMPLANT
KIT BASIN OR (CUSTOM PROCEDURE TRAY) ×2 IMPLANT
KIT POSITION SURG JACKSON T1 (MISCELLANEOUS) ×2 IMPLANT
KIT TURNOVER KIT B (KITS) ×2 IMPLANT
NDL 22X1.5 STRL (OR ONLY) (MISCELLANEOUS) ×2 IMPLANT
NEEDLE 22X1.5 STRL (OR ONLY) (MISCELLANEOUS) ×1 IMPLANT
NS IRRIG 1000ML POUR BTL (IV SOLUTION) ×2 IMPLANT
PACK LAMINECTOMY ORTHO (CUSTOM PROCEDURE TRAY) ×2 IMPLANT
PATTIES SURGICAL .5 X.5 (GAUZE/BANDAGES/DRESSINGS) ×2 IMPLANT
SPONGE SURGIFOAM ABS GEL 100 (HEMOSTASIS) ×2 IMPLANT
SPONGE SURGIFOAM ABS GEL SZ50 (HEMOSTASIS) IMPLANT
SPONGE T-LAP 4X18 ~~LOC~~+RFID (SPONGE) ×2 IMPLANT
STRIP CLOSURE SKIN 1/2X4 (GAUZE/BANDAGES/DRESSINGS) IMPLANT
SUCTION FRAZIER HANDLE 10FR (MISCELLANEOUS) ×2
SUCTION TUBE FRAZIER 10FR DISP (MISCELLANEOUS) ×2 IMPLANT
SUT BONE WAX W31G (SUTURE) ×2 IMPLANT
SUT ETHILON 3 0 PS 1 (SUTURE) IMPLANT
SUT MNCRL AB 3-0 PS2 18 (SUTURE) IMPLANT
SUT MNCRL+ AB 3-0 CT1 36 (SUTURE) ×2 IMPLANT
SUT MONOCRYL AB 3-0 CT1 36IN (SUTURE) ×1
SUT PROLENE 0 CT 2 (SUTURE) IMPLANT
SUT SILK 0 SH 30 (SUTURE) IMPLANT
SUT VIC AB 0 CT1 18XCR BRD8 (SUTURE) ×2 IMPLANT
SUT VIC AB 0 CT1 8-18 (SUTURE) ×1
SUT VIC AB 2-0 CT1 18 (SUTURE) ×2 IMPLANT
SYR BULB IRRIG 60ML STRL (SYRINGE) ×2 IMPLANT
SYR CONTROL 10ML LL (SYRINGE) ×2 IMPLANT
TOWEL GREEN STERILE (TOWEL DISPOSABLE) ×2 IMPLANT
TOWEL GREEN STERILE FF (TOWEL DISPOSABLE) ×2 IMPLANT
WATER STERILE IRR 1000ML POUR (IV SOLUTION) ×2 IMPLANT

## 2022-12-22 NOTE — Anesthesia Postprocedure Evaluation (Signed)
Anesthesia Post Note  Patient: [name]  Procedure(s) Performed: POSTERIOR LUMBAR DURAL REPAIR     Patient location during evaluation: PACU Anesthesia Type: General Level of consciousness: awake and alert Pain management: pain level controlled Vital Signs Assessment: post-procedure vital signs reviewed and stable Respiratory status: spontaneous breathing, nonlabored ventilation, respiratory function stable and patient connected to nasal cannula oxygen Cardiovascular status: blood pressure returned to baseline and stable Postop Assessment: no apparent nausea or vomiting Anesthetic complications: no  No notable events documented.  Last Vitals:  Vitals:   12/22/22 1630 12/22/22 1653  BP: (!) 151/56 (!) 161/53  Pulse: (!) 58 61  Resp: 15 18  Temp: 36.7 C (!) 36.3 C  SpO2: 98% 98%    Last Pain:  Vitals:   12/22/22 1653  TempSrc: Oral  PainSc: 0-No pain                 Effie Berkshire

## 2022-12-22 NOTE — Transfer of Care (Signed)
Immediate Anesthesia Transfer of Care Note  Patient: [name]  Procedure(s) Performed: POSTERIOR LUMBAR DURAL REPAIR  Patient Location: PACU  Anesthesia Type:General  Level of Consciousness: drowsy  Airway & Oxygen Therapy: Patient Spontanous Breathing  Post-op Assessment: Report given to RN and Post -op Vital signs reviewed and stable  Post vital signs: Reviewed and stable  Last Vitals:  Vitals Value Taken Time  BP 139/53 12/22/22 1517  Temp    Pulse 67 12/22/22 1520  Resp 11 12/22/22 1520  SpO2 97 % 12/22/22 1520  Vitals shown include unvalidated device data.  Last Pain:  Vitals:   12/22/22 1100  TempSrc: Oral  PainSc:       Patients Stated Pain Goal: 0 ([phone] [id number])  Complications: No notable events documented.

## 2022-12-22 NOTE — Progress Notes (Signed)
Pt arrived to unit A&Ox4, with order to keep flat with no rolling or turning for 24hrs. Drain on the Rt is no suction and Drain on the Lt is to suction. Family at bedside.

## 2022-12-22 NOTE — Plan of Care (Signed)
  Problem: Education: Goal: Knowledge of General Education information will improve Description: Including pain rating scale, medication(s)/side effects and non-pharmacologic comfort measures Outcome: Progressing   Problem: Activity: Goal: Risk for activity intolerance will decrease Outcome: Progressing   Problem: Nutrition: Goal: Adequate nutrition will be maintained Outcome: Progressing   

## 2022-12-22 NOTE — Progress Notes (Addendum)
Orthopedic Surgery Post-operative Progress Note  Assessment: Patient is a 89 y.o. male who is currently admitted after undergoing L3-5 laminectomies complicated by intra-operative dural tear s/p repair   Plan: -Operative plans: to OR today for revision durotomy repair -No bending/lifting/twisting greater than 10 pounds -Pain control -Diabetic diet -No chemoprophylaxis for dvt or antiplatelets for 72 hours after surgery -Ancef on call to OR -Disposition: remain floor status  Hypothyroidism -continue home levothyroxine  HTN -continue home amlodipine and losartan  HLD -continue home pravastatin  CAD -had chest pain last night, troponin and EKG ordered this morning -hold home plavix until 72 hours post-op  DM  -sliding scale insulin -diabetic diet  CKD -creatinine was 1.36 pre-op, it is 1.40 this morning  Discussed the rationale for surgery with the patient this morning. His headaches are still severe and positional and there is clear fluid within the deep drain. I feel he has persistent CSF leak so I recommended exploration and repair. The risks of surgery, would be infection, persistent CSF leak, instability, death, MI, blood clot, neurologic injury. The benefits would be to hopefully stop the CSF leak. The alternative would be to continue bed rest or live with the CSF leak and hope that a pseudomeningocele forms. After covering the risks, benefits, and alternatives, patient elected to proceed. Will plan for operative management today.  ___________________________________________________________________________   Subjective: Having positional headaches again this morning. They are no better than yesterday. Headache goes away when laying flat. Back pain is under control. Not having any leg pain.  Of note, had chest pain last night and has history of NSTEMI  Objective:  General: no acute distress, appropriate affect Neurologic: alert, answering questions appropriately,  following commands Respiratory: unlabored breathing on room air Skin: dressing clear/dry/intact, drains with sanguinous output (clear fluid seen in the tubing of the deep drain)  MSK (spine):  -Strength exam      Right  Left  EHL    5/5  5/5 TA    5/5  5/5 GSC    5/5  5/5 Knee extension  5/5  5/5 Hip flexion   5/5  5/5  -Sensory exam    Sensation intact to light touch in L3-S1 nerve distributions of bilateral lower extremities   Patient name: [name] Patient MRN: [id number] Date: 12/22/22

## 2022-12-22 NOTE — Progress Notes (Signed)
Orthopedic Surgery Post-operative Progress Note  Assessment: Patient is a 89 y.o. male who is currently admitted after undergoing L3-5 laminectomies complicated by intra-operative dural tear s/p repair and now repeat repair   Plan: -Operative plans: complete -No bending/lifting/twisting greater than 10 pounds -Head of bed flat x24 hours, then will gradually raise head of bed -Deep drain to gravity, superficial to suction -Pain control -Diabetic diet -No chemoprophylaxis for dvt or antiplatelets for 72 hours after surgery -Ancef x2 post-operative doses -Disposition: return for floor from PACU  Hypothyroidism -continue home levothyroxine  HTN -continue home amlodipine and losartan  HLD -continue home pravastatin  CAD -had chest pain last night, troponin and EKG ordered this morning -hold home plavix until 72 hours post-op  DM  -sliding scale insulin -diabetic diet  CKD -creatinine was 1.36 pre-op, it is 1.40 this morning  ___________________________________________________________________________   Subjective: No acute events since surgery. Having some back pain but it is well controlled. No leg pain. Denies headaches. Denies paresthesias and numbness.   Objective:  General: no acute distress, appropriate affect Neurologic: alert, answering questions appropriately, following commands Respiratory: unlabored breathing on room air Skin: dressing clear/dry/intact, drains with minimal sanguinous output   MSK (spine):  -Strength exam      Right  Left  EHL    5/5  5/5 TA    5/5  5/5 GSC    5/5  5/5 Knee extension  5/5  5/5 Hip flexion   5/5  5/5  -Sensory exam    Sensation intact to light touch in L3-S1 nerve distributions of bilateral lower extremities   Patient name: [name] Patient MRN: [id number] Date: 12/22/22

## 2022-12-22 NOTE — Anesthesia Preprocedure Evaluation (Addendum)
Anesthesia Evaluation  Patient identified by MRN, date of birth, ID band Patient awake    Reviewed: Allergy & Precautions, NPO status , Patient's Chart, lab work & pertinent test results  Airway Mallampati: II  TM Distance: >3 FB Neck ROM: Full    Dental  (+) Partial Upper, Dental Advisory Given   Pulmonary former smoker   breath sounds clear to auscultation       Cardiovascular hypertension, + CAD and + Cardiac Stents   Rhythm:Regular Rate:Normal     Neuro/Psych  Neuromuscular disease    GI/Hepatic negative GI ROS, Neg liver ROS,,,  Endo/Other  diabetesHypothyroidism    Renal/GU Renal disease     Musculoskeletal negative musculoskeletal ROS (+)    Abdominal   Peds  Hematology negative hematology ROS (+)   Anesthesia Other Findings   Reproductive/Obstetrics                             Anesthesia Physical Anesthesia Plan  ASA: 3  Anesthesia Plan: General   Post-op Pain Management: Minimal or no pain anticipated   Induction: Intravenous  PONV Risk Score and Plan: 3 and Ondansetron and Treatment may vary due to age or medical condition  Airway Management Planned: Oral ETT  Additional Equipment: None  Intra-op Plan:   Post-operative Plan: Extubation in OR  Informed Consent: I have reviewed the patients History and Physical, chart, labs and discussed the procedure including the risks, benefits and alternatives for the proposed anesthesia with the patient or authorized representative who has indicated his/her understanding and acceptance.     Dental advisory given  Plan Discussed with: CRNA  Anesthesia Plan Comments:        Anesthesia Quick Evaluation

## 2022-12-22 NOTE — Op Note (Signed)
Orthopedic Spine Surgery Operative Report  Procedure: Revision durotomy repair  Modifier: 78  Date of procedure: 12/22/2022  Patient name: [name] MRN: [id number] DOB: [birth year]  Surgeon: Ileene Rubens, MD Assistant: None Pre-operative diagnosis: persistent CSF leak Post-operative diagnosis: same as above Findings: small durotomy just lateral to prior repair  Specimens: none Anesthesia: general EBL: [id number] Complications: none Pre-incision antibiotic: ancef  Implants: none   Indication for procedure: Patient is a 89 y.o. male who was admitted after undergoing two level laminectomy. During the index procedure, a durotomy occurred. The patient had persistent postural headaches and was leaking CSF into his deep drain. After discussing with patient continued monitoring with low head of bed versus exploration and repair of suspected persistent durotomy, patient elected to proceed with surgery. The risks, including but not limited to infection, persistent CSF leak, instability, pain, neurologic injury, MI, DVT, PE were discussed with the patient. After the discussion the risks, patient wanted to proceed.  Procedure Description: The patient was met in the pre-operative holding area. The patient's identity and consent were verified. The operative site was marked. The patient's remaining questions about the surgery were answered. The patient was brought back to the operating room. General anesthesia was induced and an endotracheal tube was placed by the anesthesia staff. The patient was transferred to the prone [place] table in the prone position. All bony prominences were well padded. The head of the bed was slightly elevated and the eyes were free from compression by the face pillow. The drains and prior steri strips were removed. The surgical area was cleansed with alcohol. The patient's skin was then prepped and draped in a standard, sterile fashion. A time out was performed that  identified the patient and the planned procedure. All team members agreed with what was stated in the time out.   A knife was used to cut out the old sutures at both the skin and deep dermal layer. The wound then opened up and the fascial sutures were visible. A knife was used to cut these sutures. Cerebellar retractors were then placed into the wound and the wound opened up to the laminectomy defect. A rongeur was used to remove all the prior sutures. Then, the operative microscope was brought in. The prior durotomy site was inspected and a penfield was used to explore. There was a dural defect seen just lateral to the prior durotomy repair. It was actively leaking CSF. A 6-0 neurolon suture was then used to repair this defect in a simple interrupted fashion. There was no further CSF leakage seen at this point. The thecal sac had reinflated with CSF. A Valsalva maneuver was performed by anesthesia a total of 3 times at 30 for 30 seconds while each area of the laminectomy defect including the known durotomy was inspected. No further CSF was seen to be leaking during any of the Valsalva maneuvers. Gel foam and thrombin was used obtain hemostasis. Next, the gel foam was removed and the wound was dry. A piece of duragen was placed over the repaired dura. Duraseal exact was then misted over the dura and the duragen.    534m of vancomycin powder was placed into the wound. A medium hemovac drain was placed deep to the fascia - it was not placed to suction. The muscular layer was reapproximated with 0 prolene. The fascia was reapproximated with 0 prolene suture in a watertight fashion. A subcutaneous hemovac drain was placed above the fascia. The subcutaneous fat was reapproximated with 0 vicryl  suture. The deep dermal layer was reapproximated with 2-0 viryl. The skin as closed with a 3-0 running moncryl. All counts were correct at the end of the case. The incision was dressed with steri strips and mastisol. An island  dressing was placed over the wound. The patient was transferred back to a bed and brought to the post-anesthesia care unit by anesthesia staff in stable condition.  Post-operative plan: The patient will recover in the post-anesthesia care unit and then go to the floor. The patient will receive two post-operative doses of ancef. The patient will remain flat for 24 hours and then we will gradually restart elevating his head of bed. His deep drain should remain to gravity while his superficial drain will be to suction. The patient's disposition will be determined based off how he does post-operatively on the floor.    Ileene Rubens, MD Orthopedic Surgeon

## 2022-12-22 NOTE — Brief Op Note (Signed)
12/20/2022 - 12/22/2022  3:07 PM  PATIENT:  [name]  89 y.o. male  PRE-OPERATIVE DIAGNOSIS: persistent CSF leak  POST-OPERATIVE DIAGNOSIS:  same as above  PROCEDURE:  Procedure(s): POSTERIOR LUMBAR DURAL REPAIR (N/A)  SURGEON:  Surgeon(s) and Role:    * Callie Fielding, MD - Primary  PHYSICIAN ASSISTANT:   ASSISTANTS: none   ANESTHESIA:   general  EBL:  25 mL   BLOOD ADMINISTERED:none  DRAINS:  2 medium hemovac drains in the back    LOCAL MEDICATIONS USED:  NONE  SPECIMEN:  No Specimen  DISPOSITION OF SPECIMEN:  N/A  COUNTS:  YES  TOURNIQUET:  None  DICTATION: .Note written in EPIC  PLAN OF CARE: Admit to inpatient   PATIENT DISPOSITION:  PACU - hemodynamically stable.   Delay start of Pharmacological VTE agent (>24hrs) due to surgical blood loss or risk of bleeding: yes

## 2022-12-22 NOTE — Anesthesia Procedure Notes (Signed)
Procedure Name: Intubation Date/Time: 12/22/2022 11:30 AM  Performed by: Sammie Bench, CRNAPre-anesthesia Checklist: Patient identified, Emergency Drugs available, Suction available and Patient being monitored Patient Re-evaluated:Patient Re-evaluated prior to induction Oxygen Delivery Method: Circle System Utilized Preoxygenation: Pre-oxygenation with 100% oxygen Induction Type: IV induction Ventilation: Mask ventilation without difficulty Laryngoscope Size: Mac and 3 Grade View: Grade II Tube type: Oral Tube size: 8.0 mm Number of attempts: 1 Airway Equipment and Method: Stylet and Oral airway Placement Confirmation: ETT inserted through vocal cords under direct vision, positive ETCO2 and breath sounds checked- equal and bilateral Secured at: 24 cm Tube secured with: Tape Dental Injury: Teeth and Oropharynx as per pre-operative assessment

## 2022-12-23 ENCOUNTER — Other Ambulatory Visit: Payer: Self-pay

## 2022-12-23 ENCOUNTER — Encounter (HOSPITAL_COMMUNITY): Payer: Self-pay | Admitting: Orthopedic Surgery

## 2022-12-23 LAB — BASIC METABOLIC PANEL
Anion gap: 10 (ref 5–15)
BUN: 24 mg/dL — ABNORMAL HIGH (ref 8–23)
CO2: 23 mmol/L (ref 22–32)
Calcium: 8.8 mg/dL — ABNORMAL LOW (ref 8.9–10.3)
Chloride: 101 mmol/L (ref 98–111)
Creatinine, Ser: 1.27 mg/dL — ABNORMAL HIGH (ref 0.61–1.24)
GFR, Estimated: 54 mL/min — ABNORMAL LOW (ref >60)
Glucose, Bld: 177 mg/dL — ABNORMAL HIGH (ref 70–99)
Potassium: 4.3 mmol/L (ref 3.5–5.1)
Sodium: 134 mmol/L — ABNORMAL LOW (ref 135–145)

## 2022-12-23 LAB — GLUCOSE, CAPILLARY
Glucose-Capillary: 170 mg/dL — ABNORMAL HIGH (ref 70–99)
Glucose-Capillary: 182 mg/dL — ABNORMAL HIGH (ref 70–99)
Glucose-Capillary: 167 mg/dL — ABNORMAL HIGH (ref 70–99)
Glucose-Capillary: 172 mg/dL — ABNORMAL HIGH (ref 70–99)
Glucose-Capillary: 147 mg/dL — ABNORMAL HIGH (ref 70–99)

## 2022-12-23 MED ORDER — CLOPIDOGREL BISULFATE 75 MG PO TABS
75.0000 mg | ORAL_TABLET | Freq: Every day | ORAL | Status: DC
  Administered 2022-12-24 – 2022-12-25 (×2): 75 mg via ORAL
  Filled 2022-12-23 (×2): qty 1

## 2022-12-23 MED FILL — Thrombin (Recombinant) For Soln 20000 Unit: CUTANEOUS | Qty: 1 | Status: AC

## 2022-12-23 NOTE — TOC Initial Note (Signed)
Transition of Care [facility]) - Initial/Assessment Note    Patient Details  Name: [name] MRN: [id number] Date of Birth: Dec 26, 1929  Transition of Care [facility]) CM/SW Contact:    Ninfa Meeker, RN Phone Number: 12/23/2022, 2:37 PM  Clinical Narrative:                 Transition of Care Screening Note:  Transition of Care [facility]) Department has reviewed patient and no TOC needs have been identified at this time. We will continue to monitor patient advancement through Interdisciplinary progressions and if new patient needs arise, please place a consult.        Patient Goals and CMS Choice            Expected Discharge Plan and Services                                              Prior Living Arrangements/Services                       Activities of Daily Living Home Assistive Devices/Equipment: Cane (specify quad or straight) ADL Screening (condition at time of admission) Patient's cognitive ability adequate to safely complete daily activities?: Yes Is the patient deaf or have difficulty hearing?: No Does the patient have difficulty seeing, even when wearing glasses/contacts?: No Does the patient have difficulty concentrating, remembering, or making decisions?: No Patient able to express need for assistance with ADLs?: No Does the patient have difficulty dressing or bathing?: No Independently performs ADLs?: Yes (appropriate for developmental age) Does the patient have difficulty walking or climbing stairs?: Yes Weakness of Legs: Both Weakness of Arms/Hands: None  Permission Sought/Granted                  Emotional Assessment              Admission diagnosis:  Lumbar stenosis with neurogenic claudication [M48.062] Patient Active Problem List   Diagnosis Date Noted   Incidental durotomy 12/22/2022   Lumbar stenosis with neurogenic claudication 12/20/2022   Bilateral carotid artery disease, unspecified type ([place]) 11/21/2022    Chronic left-sided low back pain with left-sided sciatica 09/07/2021   Pruritic rash 12/13/2020   CKD (chronic kidney disease) stage 3, GFR 30-59 ml/min ([place]) 12/24/2019   DM (diabetes mellitus) ([place]) 12/09/2019   Hyperlipidemia associated with type 2 diabetes mellitus ([place]) 10/09/2015   Hypothyroidism    CAD (coronary artery disease) 02/12/2011   Hypertension 02/12/2011   PCP:  Flossie Buffy, NP Pharmacy:   [facility], [place] [place] [address] [place] [address] Phone: [phone] Fax: [phone]  [facility] [id number] - [address] [place]), [place] [id number] [place]) [place] [address] Phone: [phone] Fax: [phone]  [facility] [id number] - [place] [id number] [place] [address] Phone: [phone] Fax: [phone]     Social Determinants of Health (SDOH) Social History: SDOH Screenings   Food Insecurity: No Food Insecurity (11/29/2022)  Housing: Low Risk  (11/27/2021)  Transportation Needs: No Transportation Needs (11/29/2022)  Alcohol Screen: Low Risk  (11/27/2021)  Depression (PHQ2-9): Low Risk  (11/29/2022)  Financial Resource Strain: Low Risk  (11/29/2022)  Physical Activity: Sufficiently Active (11/29/2022)  Social Connections: Moderately Integrated (11/27/2021)  Stress: No  Stress Concern Present (11/29/2022)  Tobacco Use: Medium Risk (12/23/2022)   SDOH Interventions:     Readmission Risk Interventions     No data to display

## 2022-12-23 NOTE — Progress Notes (Signed)
Pt. C/o chest pain while doing incentive spirometer. Pt. Refused repositioning( lower degree angle) in bed, EKG taken. MD Stanford [place] notified

## 2022-12-23 NOTE — Progress Notes (Signed)
Orthopedic Surgery Post-operative Progress Note  Assessment: Patient is a 89 y.o. male who is currently admitted after undergoing L3-5 laminectomies complicated by intra-operative dural tear s/p repair and now repeat repair   Plan: -Operative plans: complete -No bending/lifting/twisting greater than 10 pounds -No HOB restrictions -Deep drain to gravity, superficial to suction -Pain control -Diabetic diet -No chemoprophylaxis for dvt or antiplatelets for 72 hours after surgery -Disposition: remain floor status  Hypothyroidism -continue home levothyroxine  HTN -continue home amlodipine and losartan  HLD -continue home pravastatin  CAD -EKG and troponin not consistent with ACS -chest pain resolved on its own accord with no intervention -hold home plavix until 72 hours post-op  DM  -sliding scale insulin -diabetic diet  CKD -creatinine was 1.36 pre-op, it is 1.27 this morning -will stop monitoring  ___________________________________________________________________________   Subjective: No acute events since seen this morning. Having some low back pain but medications have it under control. No leg pain. Denies paresthesias and numbness. No headaches. No chest pain.  Raised his head of bed over the period an hour. At each interval, he had no headache. Next, we walked the halls together with his walker. He had no radiating leg pain like before surgery. He also did not have any headaches.   Objective:  General: no acute distress, appropriate affect Neurologic: alert, answering questions appropriately, following commands Respiratory: unlabored breathing on room air Skin: dressing clear/dry/intact, deep drain with 10cc of sanguinous output and no clear fluid seen, superficial drain with 0 output  MSK (spine):  -Strength exam      Right  Left  EHL    5/5  5/5 TA    5/5  5/5 GSC    5/5  5/5 Knee extension  5/5  5/5 Hip flexion   5/5  5/5  -Sensory  exam    Sensation intact to light touch in L3-S1 nerve distributions of bilateral lower extremities   Patient name: [name] Patient MRN: [id number] Date: 12/23/22

## 2022-12-23 NOTE — Discharge Summary (Shared)
Orthopedic Surgery Discharge Summary  Patient name: Elray Stilts Patient MRN: [id number] [place] today: 12/20/2022 Discharge date: 12/25/2022  Attending physician: Ileene Rubens, MD Final diagnosis: lumbar stenosis with neurogenic claudication, durotomy s/p repair Findings: L3/4 and L4/5 hypertrophic facets and ligamentum flavum, incidental durotomy during index procedure, persistent CSF leak seen during revision durotomy repair   Hospital course: Patient is a 89 y.o. male who was admitted after undergoing L3-5 laminectomies. The surgery was complication by an incidental durotomy. A repair was made at the index procedure. Patient was admitted to the floor post-operatively. He was laid flat and on bed rest the night of surgery. The next day (POD#1), he was having postural headaches as his head of bed was raised. For the remainder of POD#1, his head of bed was kept between 0-30 degrees. On POD#2, an attempt was again made to raise his head of bed but he again developed postural headaches. There was clear fluid seen within his deep drain and output remained high in that drain. Discussed continued observation versus re-exploration and likely revision repair of the durotomy site with the patient. After our discussion, patient elected to proceed with surgery. He underwent exploration and revision durotomy repair on 12/22/2022. He was laid flat for 24 hours after this surgery. His head of bed was gradually raised on 12/23/2022 in the afternoon. He tolerated head of bed raise without any return of headaches. He ambulated the halls that evening with no leg pain or headaches. On 12/24/2022, he had a headache in the morning but then was tolerating being upright he remainder of the day including two walks through the halls without any headaches. His deep drain was removed on this day too. On 12/25/2022, he was able to be seated upright in bed and walk the halls without any headaches. During the admission, he complained  of chest pain and he has a history of NSTEMI so troponin and EKG were ordered. His troponin was slightly elevated at 20, but trended down to within normal limits. His EKG did not show any new findings. His chest pain resolved without intervention after 30 minutes. He had no further chest pain. The hospital protocol was followed and no further intervention or work up was performed for this chest pain. The patient had significant pain in the post operative period in his back, but pain eventually was controlled with a multimodal regimen including oxycodone. The patient worked with physical and occupational therapy who recommended discharge to home. The patient was tolerating an oral diet without issue and was voiding spontaneously after surgery. He had a BM during the admission. The patient's vitals were stable on the day of discharge. The patient's superficial drain was removed on the day of discharge. The patient was medically ready for discharge and was discharge to home on post-operative day 5.  Instructions:   Orthopedic Surgery Discharge Instructions  Patient name: Jami Seaborn Procedure Performed: L3-5 laminectomies (12/20/2022), revision durotomy repair (12/22/2022) Surgeon: Ileene Rubens, MD  Pre-operative Diagnosis: lumbar stenosis with neurogenic claudication Post-operative Diagnosis: lumbar stenosis with neurogenic claudication, durotomy  Discharge Date: 12/25/2022 Discharged to: home Discharge Condition: stable  Activity: You should refrain from bending, lifting, or twisting with objects greater than ten pounds until six weeks after surgery. You are encouraged to walk as much as desired. You can perform household activities such as cleaning dishes, doing laundry, vacuuming, etc. as long as the ten-pound restriction is followed. You do not need to wear a brace during the post-operative period.  Incision Care: Your incision site has a dressing over it. That dressing should remain in  place and dry at all times for a total of one week after surgery. After one week, you can remove the dressing. Underneath the dressing, you will find pieces of tape. You should leave these pieces of tape in place. They will fall off with time. Do not pick, rub, or scrub at them. Do not put cream or lotion over the surgical area. After one week and once the dressing is off, it is okay to let soap and water run over your incision. Again, do not pick, scrub, or rub at the pieces of tape when bathing. Do not submerge (e.g., take a bath, swim, go in a hot tub, etc.) until six weeks after surgery. There may be some bloody drainage from the incision into the dressing after surgery. This is normal. You do not need to replace the dressing. Continue to leave it in place for the one week as instructed above. Should the dressing become saturated with blood or drainage, please call the office for further instructions.   Medications: You have been prescribed oxycodone. This is a narcotic pain medication and should only be taken as prescribed. You should not drink alcohol or operate heavy machinery (including driving) while taking this medication. The oxycodone can cause constipation as a side effect. For that reason, you have been prescribed senna and miralax. These are both laxatives. You do not need to take this medication if you develop diarrhea. Should you remain constipated even while taking these medications, please increase the dose of miralax to twice daily. Tylenol has been prescribed to be taken every 8 hours, which will give you additional pain relief. Robaxin is a muscle relaxer that has been prescribed to you for muscle spasm type pain. Take this medication as needed.   Do not take NSAIDs (ibuprofen, Aleve, Celebrex, naproxen, meloxicam, etc.) while taking your home plavix. Both medications can thin your blood.   In order to set expectations for opioid prescriptions, you will only be prescribed opioids for a  total of six weeks after surgery and, at two-weeks after surgery, your opioid prescription will start to tapered (decreased dosage and number of pills). If you have ongoing need for opioid medication six weeks after surgery, you will be referred to pain management. If you are already established with a provider that is giving you opioid medications, you should schedule an appointment with them for six weeks after surgery if you feel you are going to need another prescription. State law only allows for opioid prescriptions one week at a time. If you are running out of opioid medication near the end of the week, please call the office during business hours before running out so I can send you another prescription.   You may resume any home blood thinners (warfarin, lovenox, apixaban, plavix, xarelto, etc) after your surgery. Take these medications as they were previously prescribed.  Driving: You should not drive while taking narcotic pain medications. You should start getting back to driving slowly and you may want to try driving in a parking lot before doing anything more.   Diet: You are safe to resume your regular diet after surgery.   Reasons to Call the Office After Surgery: You should feel free to call the office with any concerns or questions you have in the post-operative period, but you should definitely notify the office if you develop: -shortness of breath, chest pain, or trouble breathing -excessive bleeding,  drainage, redness, or swelling around the surgical site -fevers, chills, or pain that is getting worse with each passing day -persistent nausea or vomiting -new weakness in either leg -new or worsening numbness or tingling in either leg -headaches like you had while admitted  -numbness in the groin, bowel or bladder incontinence -other concerns about your surgery  Follow Up Appointments: You should have an office appointment scheduled for approximately two weeks after surgery. If  you do not remember when this appointment is or do not already have it scheduled, please call the office to schedule.   Office Information:  -Ileene Rubens, MD -Phone number: [phone] -Address: [address]       [place], [place] [address]

## 2022-12-23 NOTE — Discharge Instructions (Signed)
Orthopedic Surgery Discharge Instructions  Patient name: [name] Procedure Performed: L3-5 laminectomies (12/20/2022), revision durotomy repair (12/22/2022) Surgeon: Ileene Rubens, MD  Pre-operative Diagnosis: lumbar stenosis with neurogenic claudication Post-operative Diagnosis: lumbar stenosis with neurogenic claudication, durotomy  Discharge Date: 12/25/2022 Discharged to: home Discharge Condition: stable  Activity: You should refrain from bending, lifting, or twisting with objects greater than ten pounds until six weeks after surgery. You are encouraged to walk as much as desired. You can perform household activities such as cleaning dishes, doing laundry, vacuuming, etc. as long as the ten-pound restriction is followed. You do not need to wear a brace during the post-operative period.   Incision Care: Your incision site has a dressing over it. That dressing should remain in place and dry at all times for a total of one week after surgery. After one week (starting 12/30/2022), you can remove the dressing. Underneath the dressing, you will find pieces of tape. You should leave these pieces of tape in place. They will fall off with time. Do not pick, rub, or scrub at them. Do not put cream or lotion over the surgical area. After one week and once the dressing is off, it is okay to let soap and water run over your incision. Again, do not pick, scrub, or rub at the pieces of tape when bathing. Do not submerge (e.g., take a bath, swim, go in a hot tub, etc.) until six weeks after surgery. There may be some bloody drainage from the incision into the dressing after surgery. This is normal. You do not need to replace the dressing. Continue to leave it in place for the one week as instructed above. Should the dressing become saturated with blood or drainage, please call the office for further instructions.   Medications: You have been prescribed oxycodone. This is a narcotic pain medication and  should only be taken as prescribed. You should not drink alcohol or operate heavy machinery (including driving) while taking this medication. The oxycodone can cause constipation as a side effect. For that reason, you have been prescribed senna and miralax. These are both laxatives. You do not need to take this medication if you develop diarrhea. Should you remain constipated even while taking these medications, please increase the dose of miralax to twice daily. Tylenol has been prescribed to be taken every 8 hours, which will give you additional pain relief. Robaxin is a muscle relaxer that has been prescribed to you for muscle spasm type pain. Take this medication as needed.   Do not take NSAIDs (ibuprofen, Aleve, Celebrex, naproxen, meloxicam, etc.) while taking your home plavix. Both medications can thin your blood.   In order to set expectations for opioid prescriptions, you will only be prescribed opioids for a total of six weeks after surgery and, at two-weeks after surgery, your opioid prescription will start to tapered (decreased dosage and number of pills). If you have ongoing need for opioid medication six weeks after surgery, you will be referred to pain management. If you are already established with a provider that is giving you opioid medications, you should schedule an appointment with them for six weeks after surgery if you feel you are going to need another prescription. State law only allows for opioid prescriptions one week at a time. If you are running out of opioid medication near the end of the week, please call the office during business hours before running out so I can send you another prescription.   You may resume any  home blood thinners (warfarin, lovenox, apixaban, plavix, xarelto, etc) after your surgery. Take these medications as they were previously prescribed.  Driving: You should not drive while taking narcotic pain medications. You should start getting back to driving  slowly and you may want to try driving in a parking lot before doing anything more.   Diet: You are safe to resume your regular diet after surgery.   Reasons to Call the Office After Surgery: You should feel free to call the office with any concerns or questions you have in the post-operative period, but you should definitely notify the office if you develop: -shortness of breath, chest pain, or trouble breathing -excessive bleeding, drainage, redness, or swelling around the surgical site -fevers, chills, or pain that is getting worse with each passing day -persistent nausea or vomiting -new weakness in either leg -new or worsening numbness or tingling in either leg -headaches like you had while admitted  -numbness in the groin, bowel or bladder incontinence -other concerns about your surgery  Follow Up Appointments: You should have an office appointment scheduled for approximately two weeks after surgery. If you do not remember when this appointment is or do not already have it scheduled, please call the office to schedule.   Office Information:  -Ileene Rubens, MD -Phone number: [phone] -Address: [address]       [place], [place] [address]

## 2022-12-23 NOTE — Plan of Care (Signed)
  Problem: Health Behavior/Discharge Planning: Goal: Ability to manage health-related needs will improve Outcome: Progressing   Problem: Activity: Goal: Risk for activity intolerance will decrease Outcome: Progressing   Problem: Nutrition: Goal: Adequate nutrition will be maintained Outcome: Progressing   

## 2022-12-23 NOTE — Progress Notes (Addendum)
Orthopedic Surgery Post-operative Progress Note  Assessment: Patient is a 89 y.o. male who is currently admitted after undergoing L3-5 laminectomies complicated by intra-operative dural tear s/p repair and now repeat repair   Plan: -Operative plans: complete -No bending/lifting/twisting greater than 10 pounds -Head of bed flat x24 hours, then will gradually raise head of bed -Deep drain to gravity, superficial to suction -Pain control -Diabetic diet -No chemoprophylaxis for dvt or antiplatelets for 72 hours after surgery -Disposition: remain floor status  Hypothyroidism -continue home levothyroxine  HTN -continue home amlodipine and losartan  HLD -continue home pravastatin  CAD -EKG and troponin not consistent with ACS -hold home plavix until 72 hours post-op  DM  -sliding scale insulin -diabetic diet  CKD -creatinine was 1.36 pre-op, it is 1.27 this morning -will stop monitoring  ___________________________________________________________________________   Subjective: No acute events since surgery. Remains in supine position with head of bed at 0 degrees. Eating breakfast this morning. No headaches. No pain in his legs. Back pain well controlled with current medications. Denies paresthesias and numbness.   Objective:  General: no acute distress, appropriate affect Neurologic: alert, answering questions appropriately, following commands Respiratory: unlabored breathing on room air Skin: dressing clear/dry/intact, deep drain with 5cc of sanguinous output and no clear fluid seen, superficial drain with 0 output  MSK (spine):  -Strength exam      Right  Left  EHL    5/5  5/5 TA    5/5  5/5 GSC    5/5  5/5 Knee extension  5/5  5/5 Hip flexion   5/5  5/5  -Sensory exam    Sensation intact to light touch in L3-S1 nerve distributions of bilateral lower extremities   Patient name: [name] Patient MRN: [id number] Date: 12/23/22

## 2022-12-23 NOTE — Progress Notes (Addendum)
HOB raised to 30 degrees, Pt. Tolerated well

## 2022-12-23 NOTE — Plan of Care (Signed)
  Problem: Activity: Goal: Risk for activity intolerance will decrease Outcome: Progressing   Problem: Nutrition: Goal: Adequate nutrition will be maintained Outcome: Progressing   Problem: Coping: Goal: Level of anxiety will decrease Outcome: Progressing   Problem: Elimination: Goal: Will not experience complications related to bowel motility Outcome: Progressing   

## 2022-12-24 LAB — GLUCOSE, CAPILLARY
Glucose-Capillary: 192 mg/dL — ABNORMAL HIGH (ref 70–99)
Glucose-Capillary: 266 mg/dL — ABNORMAL HIGH (ref 70–99)
Glucose-Capillary: 149 mg/dL — ABNORMAL HIGH (ref 70–99)
Glucose-Capillary: 139 mg/dL — ABNORMAL HIGH (ref 70–99)

## 2022-12-24 MED ORDER — SODIUM CHLORIDE 0.9 % IV SOLN: INTRAVENOUS | Status: DC

## 2022-12-24 MED ORDER — CAFFEINE 200 MG PO TABS
200.0000 mg | ORAL_TABLET | Freq: Once | ORAL | Status: AC
  Administered 2022-12-24: 200 mg via ORAL
  Filled 2022-12-24 (×2): qty 1

## 2022-12-24 NOTE — Evaluation (Signed)
Occupational Therapy Evaluation Patient Details Name: [name] MRN: [id number] DOB: 19-Feb-1930 Today's Date: 12/24/2022   History of Present Illness 89 y.o. male who is currently admitted after undergoing L3-5 laminectomies complicated by intra-operative dural tear s/p repair and now repeat repair.   Clinical Impression   PTA pt lives alone independently, is very active and works out regularly. Pt has only lived alone for @ 1 month since his grand daughter has moved out. His nephew is currently here from [place] to help as needed s/p surgery. Pt has had 3 falls in 3 months due to his "legs buckling". Reports his legs feel stronger since surgery.  No complaints of HA during session. Began education regarding back precautions with use of DME, AE and compensatory strategies. Able to walk around entire unit with minguard A @ RW level. Recommend HHOT to maximize functional level of independence within the home with ADL and IADL tasks and to reduce risk of falls.  Would benefit from information on a Fall Alert system Acute OT to follow to facilitate a safe DC home.      Recommendations for follow up therapy are one component of a multi-disciplinary discharge planning process, led by the attending physician.  Recommendations may be updated based on patient status, additional functional criteria and insurance authorization.   Follow Up Recommendations  Home health OT     Assistance Recommended at Discharge Frequent or constant Supervision/Assistance (initially)  Patient can return home with the following A little help with bathing/dressing/bathroom;Assistance with cooking/housework;Assist for transportation    Functional Status Assessment  Patient has had a recent decline in their functional status and demonstrates the ability to make significant improvements in function in a reasonable and predictable amount of time.  Equipment Recommendations  BSC/3in1;Other (comment) (RW)     Recommendations for Other Services PT consult     Precautions / Restrictions Precautions Precautions: Fall;Back      Mobility Bed Mobility Overal bed mobility: Needs Assistance Bed Mobility: Sidelying to Sit   Sidelying to sit: Min assist       General bed mobility comments: VC for technique    Transfers Overall transfer level: Needs assistance Equipment used: Rolling walker (2 wheels) Transfers: Sit to/from Stand Sit to Stand: Min guard                  Balance Overall balance assessment: Needs assistance, History of Falls   Sitting balance-Leahy Scale: Good       Standing balance-Leahy Scale: Fair                             ADL either performed or assessed with clinical judgement   ADL Overall ADL's : Needs assistance/impaired     Grooming: Set up;Supervision/safety   Upper Body Bathing: Set up;Supervision/ safety;Sitting   Lower Body Bathing: Moderate assistance;Sit to/from stand   Upper Body Dressing : Set up;Supervision/safety;Sitting   Lower Body Dressing: Moderate assistance;Sit to/from stand (unable to complete sit - stnad)   Toilet Transfer: Min guard;Ambulation;Rolling walker (2 wheels);Grab bars   Toileting- Clothing Manipulation and Hygiene: Minimal assistance;Sit to/from stand       Functional mobility during ADLs: Min guard;Rolling walker (2 wheels);Cueing for safety General ADL Comments: unable to complete figure four positioning     Vision Baseline Vision/History: 1 Wears glasses Additional Comments: 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     Perception     Praxis      Pertinent  Vitals/Pain Pain Assessment Pain Assessment: 0-10 Pain Score: 3  Pain Location: back Pain Descriptors / Indicators: Aching Pain Intervention(s): Limited activity within patient's tolerance, Premedicated before session     Hand Dominance Right   Extremity/Trunk Assessment Upper Extremity Assessment Upper Extremity Assessment: LUE  deficits/detail LUE Deficits / Details: L shoulder deficits from fall; plans to follow up with ortho after he heals from his back       Cervical / Trunk Assessment Cervical / Trunk Assessment: Other exceptions;Back Surgery;Kyphotic   Communication Communication Communication: No difficulties   Cognition Arousal/Alertness: Awake/alert Behavior During Therapy: WFL for tasks assessed/performed Overall Cognitive Status: Within Functional Limits for tasks assessed                                       General Comments  hemovac drain    Exercises     Shoulder Instructions      Home Living Family/patient expects to be discharged to:: Private residence Living Arrangements: Alone Available Help at Discharge: Available 24 hours/day;Family Type of Home: House Home Access: Stairs to enter Technical brewer of Steps: 2 Entrance Stairs-Rails: Right;Left Home Layout: One level     Bathroom Shower/Tub: Corporate investment banker: Standard Bathroom Accessibility: Yes How Accessible: Accessible via walker Home Equipment: Cane - single point;Shower seat;Grab bars - toilet;Grab bars - tub/shower;Adaptive equipment Adaptive Equipment: Reacher        Prior Functioning/Environment Prior Level of Function : Independent/Modified Independent;Driving;Other (comment) (works out at [employer], works at [employer])                        OT Problem List: Decreased strength;Decreased range of motion;Impaired balance (sitting and/or standing);Decreased safety awareness;Decreased knowledge of use of DME or AE;Decreased knowledge of precautions;Pain      OT Treatment/Interventions: Self-care/ADL training;DME and/or AE instruction;Therapeutic activities;Balance training;Patient/family education    OT Goals(Current goals can be found in the care plan section) Acute Rehab OT Goals Patient Stated Goal: to play golf again OT Goal Formulation: With  patient/family Time For Goal Achievement: 01/07/23 Potential to Achieve Goals: Good  OT Frequency: Min 2X/week    Co-evaluation              AM-PAC OT "6 Clicks" Daily Activity     Outcome Measure Help from another person eating meals?: None Help from another person taking care of personal grooming?: A Little Help from another person toileting, which includes using toliet, bedpan, or urinal?: A Little Help from another person bathing (including washing, rinsing, drying)?: A Lot Help from another person to put on and taking off regular upper body clothing?: A Little Help from another person to put on and taking off regular lower body clothing?: A Lot 6 Click Score: 17   End of Session Equipment Utilized During Treatment: Gait belt;Rolling walker (2 wheels) Nurse Communication: Mobility status  Activity Tolerance: Patient tolerated treatment well Patient left: in chair;with call bell/phone within reach;with chair alarm set;with family/visitor present  OT Visit Diagnosis: Unsteadiness on feet (R26.81);Other abnormalities of gait and mobility (R26.89);Muscle weakness (generalized) (M62.81);History of falling (Z91.81);Pain Pain - part of body:  (back)                Time: [id number] OT Time Calculation (min): 37 min Charges:  OT General Charges $OT Visit: 1 Visit OT Evaluation $OT Eval Moderate Complexity: 1 Mod OT Treatments $  Self Care/Home Management : 8-22 mins  Maurie Boettcher, OT/L   Acute OT Clinical Specialist Acute Rehabilitation Services Pager [phone] Office [phone]   [facility] 12/24/2022, 1:04 PM

## 2022-12-24 NOTE — Progress Notes (Signed)
Physical Therapy Evaluation Patient Details Name: [name] MRN: [id number] DOB: Aug 15, 1930 Today's Date: 12/24/2022  History of Present Illness  89 y.o. male who is currently admitted after undergoing L3-5 laminectomies complicated by intra-operative dural tear s/p repair and now repeat repair.  Clinical Impression  Pt was seen for instruction of mobility including back precautions, to observe safety and independence on walker and review his use of steps to ensure safety for home.  Pt is demonstrating a better control of balance and body mechanics, and will recommend HHPT follow him to reinforce safety and instruct pt and his home caregivers.  Has a good network of help set up, some of whom were in hosp visiting him during PT evaluation.  Follow along to reinforce precautions and continue to teach him safety and to know where to set limits as he recovers control of esp RLE.  Follow along as PT POC goals are established.      Recommendations for follow up therapy are one component of a multi-disciplinary discharge planning process, led by the attending physician.  Recommendations may be updated based on patient status, additional functional criteria and insurance authorization.  Follow Up Recommendations Home health PT      Assistance Recommended at Discharge Intermittent Supervision/Assistance  Patient can return home with the following  A little help with walking and/or transfers;A little help with bathing/dressing/bathroom;Assistance with cooking/housework;Assist for transportation;Help with stairs or ramp for entrance    Equipment Recommendations Rolling walker (2 wheels)  Recommendations for Other Services       Functional Status Assessment Patient has had a recent decline in their functional status and demonstrates the ability to make significant improvements in function in a reasonable and predictable amount of time.     Precautions / Restrictions  Precautions Precautions: Fall;Back Precaution Booklet Issued: Yes (comment) Precaution Comments: requires reminders every time for precautions Restrictions Weight Bearing Restrictions: No Other Position/Activity Restrictions: 10# lift limit      Mobility  Bed Mobility Overal bed mobility: Needs Assistance Bed Mobility: Sit to Sidelying, Rolling Rolling: Min assist       Sit to sidelying: Min assist General bed mobility comments: verbal reminders of body mechanics    Transfers Overall transfer level: Needs assistance Equipment used: Rolling walker (2 wheels) Transfers: Sit to/from Stand Sit to Stand: Min guard           General transfer comment: min guard for safety and correct hand placement    Ambulation/Gait Ambulation/Gait assistance: Min guard Gait Distance (Feet): 150 Feet Assistive device: Rolling walker (2 wheels) Gait Pattern/deviations: Step-through pattern, Step-to pattern, Decreased stride length Gait velocity: reduced Gait velocity interpretation: <1.31 ft/sec, indicative of household ambulator Pre-gait activities: standing balance and posture ck General Gait Details: fatigued from trip up steps in middle of walk, mild moments of R knee buckling on return trip  Stairs Stairs: Yes Stairs assistance: Min assist Stair Management: Forwards, Alternating pattern, One rail Left, One rail Right Number of Stairs: 10 General stair comments: pt is safe to take his time and alternate steps  Wheelchair Mobility    Modified Rankin (Stroke Patients Only)       Balance Overall balance assessment: Needs assistance Sitting-balance support: Feet supported Sitting balance-Leahy Scale: Good     Standing balance support: Bilateral upper extremity supported, During functional activity Standing balance-Leahy Scale: Fair Standing balance comment: less than fair dynamically  Pertinent Vitals/Pain Pain Assessment Pain  Assessment: Faces Faces Pain Scale: Hurts little more Pain Location: back Pain Descriptors / Indicators: Guarding Pain Intervention(s): Limited activity within patient's tolerance, Monitored during session, Premedicated before session, Repositioned    Home Living Family/patient expects to be discharged to:: Private residence Living Arrangements: Alone Available Help at Discharge: Available 24 hours/day;Family Type of Home: House Home Access: Stairs to enter Entrance Stairs-Rails: Psychiatric nurse of Steps: 2   Home Layout: One level Home Equipment: Cane - single point;Shower seat;Grab bars - toilet;Grab bars - tub/shower;Adaptive equipment      Prior Function Prior Level of Function : Independent/Modified Independent;Driving;Other (comment)             Mobility Comments: was in gym per pt until before surgery       Hand Dominance   Dominant Hand: Right    Extremity/Trunk Assessment   Upper Extremity Assessment Upper Extremity Assessment: Defer to OT evaluation LUE Deficits / Details: L shoulder deficits from fall; plans to follow up with ortho after he heals from his back    Lower Extremity Assessment Lower Extremity Assessment: RLE deficits/detail RLE Deficits / Details: RLE is mildly weak, tends to buckle with fatigue of effort RLE Coordination: decreased gross motor    Cervical / Trunk Assessment Cervical / Trunk Assessment: Other exceptions;Back Surgery;Kyphotic Cervical / Trunk Exceptions: new laminectomy L3-L5  Communication   Communication: No difficulties  Cognition Arousal/Alertness: Awake/alert Behavior During Therapy: WFL for tasks assessed/performed Overall Cognitive Status: Within Functional Limits for tasks assessed                                          General Comments General comments (skin integrity, edema, etc.): drain on anterior gown    Exercises     Assessment/Plan    PT Assessment Patient  needs continued PT services  PT Problem List Decreased strength;Decreased activity tolerance;Decreased balance;Decreased mobility;Decreased coordination;Decreased knowledge of precautions;Decreased skin integrity;Pain       PT Treatment Interventions DME instruction;Gait training;Stair training;Functional mobility training;Therapeutic activities;Therapeutic exercise;Balance training;Neuromuscular re-education;Patient/family education    PT Goals (Current goals can be found in the Care Plan section)  Acute Rehab PT Goals Patient Stated Goal: to go home and get better PT Goal Formulation: With patient/family Time For Goal Achievement: 12/31/22 Potential to Achieve Goals: Good    Frequency Min 5X/week     Co-evaluation               AM-PAC PT "6 Clicks" Mobility  Outcome Measure Help needed turning from your back to your side while in a flat bed without using bedrails?: None Help needed moving from lying on your back to sitting on the side of a flat bed without using bedrails?: A Little Help needed moving to and from a bed to a chair (including a wheelchair)?: A Little Help needed standing up from a chair using your arms (e.g., wheelchair or bedside chair)?: A Little Help needed to walk in hospital room?: A Little Help needed climbing 3-5 steps with a railing? : A Little 6 Click Score: 19    End of Session Equipment Utilized During Treatment: Gait belt Activity Tolerance: Patient limited by fatigue;Treatment limited secondary to medical complications (Comment) Patient left: in bed;with call bell/phone within reach;with bed alarm set;with family/visitor present Nurse Communication: Mobility status PT Visit Diagnosis: Unsteadiness on feet (R26.81);Muscle weakness (generalized) (M62.81);Difficulty in walking, not  elsewhere classified (R26.2);History of falling (Z91.81);Pain Pain - Right/Left: Left Pain - part of body: Shoulder (back)    Time: [id number] PT Time Calculation  (min) (ACUTE ONLY): 26 min   Charges:   PT Evaluation $PT Eval Moderate Complexity: 1 Mod PT Treatments $Gait Training: 8-22 mins       Ramond Dial 12/24/2022, 3:52 PM  Mee Hives, PT PhD Acute Rehab Dept. Number: [place]

## 2022-12-24 NOTE — Progress Notes (Signed)
Orthopedic Surgery Post-operative Progress Note  Assessment: Patient is a 89 y.o. male who is currently admitted after undergoing L3-5 laminectomies complicated by intra-operative dural tear s/p repair and now repeat repair   Plan: -Operative plans: complete -No bending/lifting/twisting greater than 10 pounds -Out of bed as tolerated, no brace -Removed deep drain, superficial drain remains -Pain control -Diabetic diet -Restarted home plavix today -Will check in again with him this evening -Disposition: remain floor status  Hypothyroidism -continue home levothyroxine  HTN -continue home amlodipine and losartan  HLD -continue home pravastatin  CAD -EKG and troponin not consistent with ACS -chest pain resolved on its own accord with no intervention -hold home plavix until 72 hours post-op  DM  -sliding scale insulin -diabetic diet  CKD -creatinine was 1.36 pre-op, it is 1.27 this morning -will stop monitoring  ___________________________________________________________________________   Subjective: No acute events since seen this morning. Was able to go to the restroom and have a BM. Has been upright for 1 hour prior to seeing him. Walked the halls with him and OT this afternoon. No headaches during the duration of his walk and time upright prior to seeing him. No leg pain when ambulating. Pre-operative leg pain when walking has improved significantly. Back pain well controlled. No chest pain.   Objective:  General: no acute distress, appropriate affect Neurologic: alert, answering questions appropriately, following commands Respiratory: unlabored breathing on room air Skin: dressing with small amount of dry blood distally otherwise c/d/I, drain with 0 output  MSK (spine):  -Strength exam      Right  Left  EHL    5/5  5/5 TA    5/5  5/5 GSC    5/5  5/5 Knee extension  5/5  5/5 Hip flexion   5/5  5/5  -Sensory exam    Sensation intact to light touch in  L3-S1 nerve distributions of bilateral lower extremities   Patient name: [name] Patient MRN: [id number] Date: 12/24/22

## 2022-12-24 NOTE — Care Management Important Message (Signed)
Important Message  Patient Details  Name: [name] MRN: [id number] Date of Birth: 06-28-1930   Medicare Important Message Given:  Yes     Hannah Beat 12/24/2022, 11:35 AM

## 2022-12-24 NOTE — Progress Notes (Signed)
Orthopedic Surgery Post-operative Progress Note  Assessment: Patient is a 89 y.o. male who is currently admitted after undergoing L3-5 laminectomies complicated by intra-operative dural tear s/p repair and now repeat repair   Plan: -Operative plans: complete -No bending/lifting/twisting greater than 10 pounds -Out of bed as tolerated, no brace -Removed deep drain, superficial drain remains -Pain control -Diabetic diet -Disposition: remain floor status  Hypothyroidism -continue home levothyroxine  HTN -continue home amlodipine and losartan  HLD -continue home pravastatin  CAD -continue home plavix  DM  -sliding scale insulin -diabetic diet  CKD -creatinine was 1.36 pre-op, it is 1.27 this morning -will stop monitoring  ___________________________________________________________________________   Subjective: No acute events since seen this around midday. Took a nap after I saw him. Got up with PT and walked the halls. No headaches when working with PT.   Walked the halls with him this evening. We walked 4 alps around the unit. Had no headaches, no chest pain, no leg pain. Denies paresthesias and numbness.   Objective:  General: no acute distress, appropriate affect Neurologic: alert, answering questions appropriately, following commands Respiratory: unlabored breathing on room air Skin: dressing with small amount of dry blood distally otherwise c/d/I, drain with 0 output  MSK (spine):  -Strength exam      Right  Left  EHL    5/5  5/5 TA    5/5  5/5 GSC    5/5  5/5 Knee extension  5/5  5/5 Hip flexion   5/5  5/5  -Sensory exam    Sensation intact to light touch in L3-S1 nerve distributions of bilateral lower extremities   Patient name: [name] Patient MRN: [id number] Date: 12/24/22

## 2022-12-24 NOTE — Progress Notes (Signed)
Orthopedic Surgery Post-operative Progress Note  Assessment: Patient is a 89 y.o. male who is currently admitted after undergoing L3-5 laminectomies complicated by intra-operative dural tear s/p repair and now repeat repair   Plan: -Operative plans: complete -No bending/lifting/twisting greater than 10 pounds -Out of bed as tolerated, no brace -Removed deep drain, superficial drain remains -Pain control -Diabetic diet -Restart home plavix today -Will check in with him around noon -Disposition: remain floor status  Hypothyroidism -continue home levothyroxine  HTN -continue home amlodipine and losartan  HLD -continue home pravastatin  CAD -EKG and troponin not consistent with ACS -chest pain resolved on its own accord with no intervention -hold home plavix until 72 hours post-op  DM  -sliding scale insulin -diabetic diet  CKD -creatinine was 1.36 pre-op, it is 1.27 this morning -will stop monitoring  ___________________________________________________________________________   Subjective: No acute events overnight. Pain adequately controlled. No leg pain. No chest pain. Denies paresthesias and numbness. Raised head of bed to 50 degrees and no headache this morning, but when got upright had headache.   Objective:  General: no acute distress, appropriate affect Neurologic: alert, answering questions appropriately, following commands Respiratory: unlabored breathing on room air Skin: dressing clear/dry/intact, deep drain with 10cc of serosanguinous output and no clear fluid seen, superficial drain with 0 output  MSK (spine):  -Strength exam      Right  Left  EHL    5/5  5/5 TA    5/5  5/5 GSC    5/5  5/5 Knee extension  5/5  5/5 Hip flexion   5/5  5/5  -Sensory exam    Sensation intact to light touch in L3-S1 nerve distributions of bilateral lower extremities   Patient name: [name] Patient MRN: [id number] Date: 12/24/22

## 2022-12-25 LAB — GLUCOSE, CAPILLARY
Glucose-Capillary: 197 mg/dL — ABNORMAL HIGH (ref 70–99)
Glucose-Capillary: 111 mg/dL — ABNORMAL HIGH (ref 70–99)

## 2022-12-25 MED ORDER — OXYCODONE HCL 5 MG PO TABS: 2.5000 mg | ORAL_TABLET | ORAL | 0 refills | Status: DC | PRN

## 2022-12-25 MED ORDER — METHOCARBAMOL 500 MG PO TABS: 500.0000 mg | ORAL_TABLET | Freq: Three times a day (TID) | ORAL | 0 refills | Status: DC | PRN

## 2022-12-25 MED ORDER — SENNA 8.6 MG PO TABS: 1.0000 | ORAL_TABLET | Freq: Two times a day (BID) | ORAL | 0 refills | Status: DC

## 2022-12-25 MED ORDER — ACETAMINOPHEN 500 MG PO TABS: 1000.0000 mg | ORAL_TABLET | Freq: Three times a day (TID) | ORAL | 0 refills | Status: DC

## 2022-12-25 MED ORDER — OXYCODONE HCL 5 MG PO TABS: 2.5000 mg | ORAL_TABLET | ORAL | Status: DC | PRN

## 2022-12-25 MED ORDER — POLYETHYLENE GLYCOL 3350 17 G PO PACK: 17.0000 g | PACK | Freq: Every day | ORAL | 0 refills | Status: DC

## 2022-12-25 NOTE — Plan of Care (Signed)

## 2022-12-25 NOTE — Progress Notes (Addendum)
Orthopedic Surgery Post-operative Progress Note  Assessment: Patient is a 89 y.o. male who is currently admitted after undergoing L3-5 laminectomies complicated by intra-operative dural tear s/p repair and now repeat repair   Plan: -Operative plans: complete -No bending/lifting/twisting greater than 10 pounds -Out of bed as tolerated, no brace -All drains now out -Pain control -Diabetic diet -Discharge to home this afternoon  Hypothyroidism -continue home levothyroxine  HTN -continue home amlodipine and losartan  HLD -continue home pravastatin  CAD -continue home plavix  DM  -sliding scale insulin -diabetic diet  CKD -creatinine was 1.36 pre-op, it is 1.27 this morning -will stop monitoring  ___________________________________________________________________________   Subjective: No acute events since seen this morning. No headaches or chest pain. Was able to get a nap in after seeing him this morning. Feels ready for discharge.   Again this afternoon, he was able to get from seated to stand position on his own. Then, walked one lap around the unit without his walker. Did not have any headaches. No chest pain during the walk and no leg pain during the walk.  Objective:  General: no acute distress, appropriate affect Neurologic: alert, answering questions appropriately, following commands Respiratory: unlabored breathing on room air Skin: dressing with small amount of dry blood distally otherwise c/d/I, drain with 0 output. When drain and dressing removed, no drainage from the drain hole or the incision  MSK (spine):  -Strength exam      Right  Left  EHL    5/5  5/5 TA    5/5  5/5 GSC    5/5  5/5 Knee extension  5/5  5/5 Hip flexion   5/5  5/5  -Sensory exam    Sensation intact to light touch in L3-S1 nerve distributions of bilateral lower extremities   Patient name: [name] Patient MRN: [id number] Date: 12/25/22

## 2022-12-25 NOTE — Progress Notes (Signed)
Orthopedic Surgery Post-operative Progress Note  Assessment: Patient is a 89 y.o. male who is currently admitted after undergoing L3-5 laminectomies complicated by intra-operative dural tear s/p repair and now repeat repair   Plan: -Operative plans: complete -No bending/lifting/twisting greater than 10 pounds -Out of bed as tolerated, no brace -Will remove drain prior to discharge -Pain control -Diabetic diet -Will check on patient again at midday to assess readiness for discharge -Possible discharge to home this afternoon  Hypothyroidism -continue home levothyroxine  HTN -continue home amlodipine and losartan  HLD -continue home pravastatin  CAD -continue home plavix  DM  -sliding scale insulin -diabetic diet  CKD -creatinine was 1.36 pre-op, it is 1.27 this morning -will stop monitoring  ___________________________________________________________________________   Subjective: No acute events overnight. Pain well controlled. Back pain is getting better. Has not needed narcotics in several days. No leg pain. Has not had any headaches since last seen. Denies paresthesias and numbness.  Was able to get from seated to stand position on his own. Then, walked the halls with him again this morning with no headaches. He did not have any leg pain when walking either. Was able to walk without the walker at the end of our walk.   Objective:  General: no acute distress, appropriate affect Neurologic: alert, answering questions appropriately, following commands Respiratory: unlabored breathing on room air Skin: dressing with small amount of dry blood distally otherwise c/d/I, drain with 0 output  MSK (spine):  -Strength exam      Right  Left  EHL    5/5  5/5 TA    5/5  5/5 GSC    5/5  5/5 Knee extension  5/5  5/5 Hip flexion   5/5  5/5  -Sensory exam    Sensation intact to light touch in L3-S1 nerve distributions of bilateral lower extremities   Patient name:  [name] Patient MRN: [id number] Date: 12/25/22

## 2022-12-25 NOTE — TOC Transition Note (Addendum)
Transition of Care [facility]) - CM/SW Discharge Note   Patient Details  Name: [name] MRN: [id number] Date of Birth: February 01, 1930  [facility] of [facility]) CM/SW Contact:  Sharin Mons, RN Phone Number: 12/25/2022, 10:35 AM   Clinical Narrative:    Patient will DC to: Home Anticipated DC date: 12/25/2022 Family notified:yes Transport by: car       - L3, L4, L5 lumbar laminectomy with medial facetectomies and foraminotomies, 2/16      - Revision durotomy repair,2/18 Per MD patient ready for DC today pending therapy clearance . RN, patient, and patient's nephew aware of d/c plan of DC.  Per MD pt will need home health services , PT and OT. Pt agreeable. Pt without provider preference. Referral made with [facility] and accepted. Referral made with [employer] for RW. Equipment will be delivered to bedside prior to d/c.  Pt is from home alone nephew will assist with care once d/c. Pt with transportation to home. Pt without RX MED concerns. Post hospital f/u noted on AVS.  RNCM will sign off for now as intervention is no longer needed. Please consult [place] again if new needs arise.   Final next level of care: [place] Barriers to Discharge: No Barriers Identified   Patient Goals and CMS Choice   Choice offered to / list presented to : Patient  Discharge Placement                         Discharge Plan and Services Additional resources added to the After Visit Summary for                  DME Arranged: Walker rolling DME Agency: [facility] Date DME Agency Contacted: 12/25/22 Time DME Agency Contacted: [phone] Representative spoke with at DME Agency: Erasmo Downer HH Arranged: PT, OT [facility] Agency: [place] Date [place]: 12/25/22 Time [place]: [id number] Representative spoke with at [place]: [place] Determinants of Health ([place]) Interventions SDOH Screenings   Food Insecurity: No Food Insecurity (11/29/2022)   Housing: Low Risk  (11/27/2021)  Transportation Needs: No Transportation Needs (11/29/2022)  Alcohol Screen: Low Risk  (11/27/2021)  Depression (PHQ2-9): Low Risk  (11/29/2022)  Financial Resource Strain: Low Risk  (11/29/2022)  Physical Activity: Sufficiently Active (11/29/2022)  Social Connections: Moderately Integrated (11/27/2021)  Stress: No Stress Concern Present (11/29/2022)  Tobacco Use: Medium Risk (12/23/2022)     Readmission Risk Interventions     No data to display

## 2022-12-25 NOTE — Progress Notes (Signed)
Physical Therapy Treatment Patient Details Name: [name] MRN: [id number] DOB: 11/24/1929 Today's Date: 12/25/2022   History of Present Illness 89 y.o. male who is currently admitted after undergoing L3-5 laminectomies complicated by intra-operative dural tear s/p repair and now repeat repair.    PT Comments    Continuing work on functional mobility and activity tolerance;  Session focused on progressive amb and answering any questions re: dc'ing home; Pt's nephew, Robby, present and helpful; Managing greater than household distances well; Questions solicited and answered; OK for dc home from PT standpoint     Recommendations for follow up therapy are one component of a multi-disciplinary discharge planning process, led by the attending physician.  Recommendations may be updated based on patient status, additional functional criteria and insurance authorization.  Follow Up Recommendations  Home health PT     Assistance Recommended at Discharge Intermittent Supervision/Assistance  Patient can return home with the following A little help with walking and/or transfers;A little help with bathing/dressing/bathroom;Assistance with cooking/housework;Assist for transportation;Help with stairs or ramp for entrance   Equipment Recommendations  Rolling walker (2 wheels)    Recommendations for Other Services       Precautions / Restrictions Precautions Precautions: Fall;Back Precaution Comments: Occasional reminders for precautions; Fall risk is present, but minimal Restrictions Weight Bearing Restrictions: No Other Position/Activity Restrictions: 10# lift limit     Mobility  Bed Mobility                    Transfers Overall transfer level: Needs assistance Equipment used: Rolling walker (2 wheels) Transfers: Sit to/from Stand Sit to Stand: Min guard (without physical contact)           General transfer comment: min guard for safety and correct hand  placement    Ambulation/Gait Ambulation/Gait assistance: Supervision Gait Distance (Feet): 450 Feet Assistive device: Rolling walker (2 wheels) Gait Pattern/deviations: Step-through pattern, Step-to pattern, Decreased stride length Gait velocity: reduced     General Gait Details: Cues to self-monitor for activity tolerance ; Adjusted RW height for proper fit   Stairs         General stair comments: Discussed stair management   Wheelchair Mobility    Modified Rankin (Stroke Patients Only)       Balance     Sitting balance-Leahy Scale: Good       Standing balance-Leahy Scale: Fair                              Cognition Arousal/Alertness: Awake/alert Behavior During Therapy: WFL for tasks assessed/performed Overall Cognitive Status: Within Functional Limits for tasks assessed                                          Exercises      General Comments General comments (skin integrity, edema, etc.): hemovac drain      Pertinent Vitals/Pain Pain Assessment Pain Assessment: 0-10 Pain Score: 2  Pain Location: back Pain Descriptors / Indicators: Sore Pain Intervention(s): Monitored during session    Home Living                          Prior Function            PT Goals (current goals can now be found in the care plan section) Acute  Rehab PT Goals Patient Stated Goal: to go home and get better; looks forward to returning to golf PT Goal Formulation: With patient/family Time For Goal Achievement: 12/31/22 Potential to Achieve Goals: Good Progress towards PT goals: Progressing toward goals    Frequency    Min 5X/week      PT Plan Current plan remains appropriate    Co-evaluation              AM-PAC PT "6 Clicks" Mobility   Outcome Measure  Help needed turning from your back to your side while in a flat bed without using bedrails?: None Help needed moving from lying on your back to sitting on the  side of a flat bed without using bedrails?: A Little Help needed moving to and from a bed to a chair (including a wheelchair)?: A Little Help needed standing up from a chair using your arms (e.g., wheelchair or bedside chair)?: A Little Help needed to walk in hospital room?: A Little Help needed climbing 3-5 steps with a railing? : A Little 6 Click Score: 19    End of Session   Activity Tolerance: Patient tolerated treatment well Patient left: in chair;with call bell/phone within reach;with family/visitor present Nurse Communication: Mobility status PT Visit Diagnosis: Unsteadiness on feet (R26.81);Muscle weakness (generalized) (M62.81);Difficulty in walking, not elsewhere classified (R26.2);History of falling (Z91.81);Pain Pain - Right/Left: Left Pain - part of body: Shoulder (back)     Time: [id number] PT Time Calculation (min) (ACUTE ONLY): 29 min  Charges:  $Gait Training: 23-37 mins                     Roney Marion, [place] Office [phone]    Colletta [place] 12/25/2022, 11:25 AM

## 2022-12-26 ENCOUNTER — Telehealth: Payer: Self-pay

## 2022-12-26 ENCOUNTER — Telehealth: Payer: Self-pay | Admitting: Orthopedic Surgery

## 2022-12-26 ENCOUNTER — Telehealth: Payer: Self-pay | Admitting: Nurse Practitioner

## 2022-12-26 MED ORDER — SENNA-DOCUSATE SODIUM 8.6-50 MG PO TABS: 1.0000 | ORAL_TABLET | Freq: Two times a day (BID) | ORAL | 0 refills | Status: AC

## 2022-12-26 MED ORDER — METHOCARBAMOL 500 MG PO TABS: 500.0000 mg | ORAL_TABLET | Freq: Four times a day (QID) | ORAL | 0 refills | Status: AC

## 2022-12-26 MED ORDER — ACETAMINOPHEN 500 MG PO TABS: 1000.0000 mg | ORAL_TABLET | Freq: Three times a day (TID) | ORAL | 0 refills | Status: AC

## 2022-12-26 MED ORDER — OXYCODONE HCL 5 MG PO CAPS: 5.0000 mg | ORAL_CAPSULE | ORAL | 0 refills | Status: DC | PRN

## 2022-12-26 MED ORDER — POLYETHYLENE GLYCOL 3350 17 G PO PACK: 17.0000 g | PACK | Freq: Every day | ORAL | 0 refills | Status: AC

## 2022-12-26 NOTE — Transitions of Care (Post Inpatient/ED Visit) (Signed)
   12/26/2022  Name: [name] MRN: [id number] DOB: 1930-01-02  Today's TOC FU Call Status: Today's TOC FU Call Status:: Unsuccessul Call (1st Attempt) Unsuccessful Call (1st Attempt) Date: 12/26/22  Attempted to reach the patient regarding the most recent Inpatient/ED visit.  Follow Up Plan: Additional outreach attempts will be made to reach the patient to complete the Transitions of Care (Post Inpatient/ED visit) call.   [place] LPN [place] Advisor Direct Dial [phone]

## 2022-12-26 NOTE — Telephone Encounter (Signed)
Orthopedic Telephone Note  The patient was discharged with printed prescriptions so he could take them anywhere.  The pharmacy is unable or incapable of handling paper prescriptions at this time so electronic prescriptions were sent to [facility] on [address].   Callie Fielding, MD Orthopedic Surgeon

## 2022-12-26 NOTE — Telephone Encounter (Signed)
Mikki Santee was returning [place]'s call. 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

## 2022-12-27 ENCOUNTER — Telehealth: Payer: Self-pay | Admitting: Orthopedic Surgery

## 2022-12-27 MED ORDER — OXYCODONE HCL 5 MG PO TABS: 2.5000 mg | ORAL_TABLET | ORAL | 0 refills | Status: AC | PRN

## 2022-12-27 NOTE — Addendum Note (Signed)
Addended by: Ileene Rubens on: 12/27/2022 07:31 PM   Modules accepted: Orders

## 2022-12-27 NOTE — Telephone Encounter (Signed)
[facility] on [address]/ [address] advising patients insurance will not pay for Capsules but will pay for tablets..please advise.Marland Kitchen

## 2022-12-28 DIAGNOSIS — Z7902 Long term (current) use of antithrombotics/antiplatelets: Secondary | ICD-10-CM | POA: Diagnosis not present

## 2022-12-28 DIAGNOSIS — I252 Old myocardial infarction: Secondary | ICD-10-CM | POA: Diagnosis not present

## 2022-12-28 DIAGNOSIS — Z9181 History of falling: Secondary | ICD-10-CM | POA: Diagnosis not present

## 2022-12-28 DIAGNOSIS — Z4789 Encounter for other orthopedic aftercare: Secondary | ICD-10-CM | POA: Diagnosis not present

## 2022-12-30 DIAGNOSIS — Z9181 History of falling: Secondary | ICD-10-CM | POA: Diagnosis not present

## 2022-12-30 DIAGNOSIS — I252 Old myocardial infarction: Secondary | ICD-10-CM | POA: Diagnosis not present

## 2022-12-30 DIAGNOSIS — Z7902 Long term (current) use of antithrombotics/antiplatelets: Secondary | ICD-10-CM | POA: Diagnosis not present

## 2022-12-30 DIAGNOSIS — Z4789 Encounter for other orthopedic aftercare: Secondary | ICD-10-CM | POA: Diagnosis not present

## 2022-12-30 NOTE — Transitions of Care (Post Inpatient/ED Visit) (Signed)
   12/30/2022  Name: [name] MRN: [id number] DOB: 19-Feb-1930  Today's TOC FU Call Status: Today's TOC FU Call Status:: Successful TOC FU Call Competed Unsuccessful Call (1st Attempt) Date: 12/26/22 [facility] FU Call Complete Date: 12/30/22  Transition Care Management Follow-up Telephone Call Date of Discharge: 12/25/22 Discharge Facility: [facility]) Type of Discharge: Inpatient Admission Primary Inpatient Discharge Diagnosis:: Lumbar stenosis with neurogenic claudication How have you been since you were released from the hospital?: Better Any questions or concerns?: No  Items Reviewed: Did you receive and understand the discharge instructions provided?: Yes Medications obtained and verified?: Yes (Medications Reviewed) Any new allergies since your discharge?: No Dietary orders reviewed?: Yes Do you have support at home?: Yes  Home Care and Equipment/Supplies: [place] Ordered?: Yes Name of [place]:: [place] Has Agency set up a time to come to your home?: Yes [place] Visit Date: 12/30/22 Any new equipment or medical supplies ordered?: Yes Name of Medical supply agency?: [facility] Were you able to get the equipment/medical supplies?: Yes Do you have any questions related to the use of the equipment/supplies?: No  Functional Questionnaire: Do you need assistance with bathing/showering or dressing?: Yes Do you need assistance with meal preparation?: Yes Do you need assistance with eating?: No Do you have difficulty maintaining continence: No Do you need assistance with getting out of bed/getting out of a chair/moving?: No Do you have difficulty managing or taking your medications?: No  Folllow up appointments reviewed: PCP Follow-up appointment confirmed?: No (specialist) MD Provider Line Number:[phone] Given: Yes [facility] Follow-up appointment confirmed?: Yes Date of Specialist follow-up  appointment?: 01/02/23 Follow-Up Specialty Provider:: Dr Laurance Flatten Do you need transportation to your follow-up appointment?: No Do you understand care options if your condition(s) worsen?: Yes-patient verbalized understanding    [place] LPN [place] Direct Dial [phone]

## 2022-12-31 ENCOUNTER — Telehealth: Payer: Self-pay | Admitting: Orthopedic Surgery

## 2022-12-31 ENCOUNTER — Telehealth: Payer: Self-pay | Admitting: Cardiovascular Disease

## 2022-12-31 DIAGNOSIS — Z9181 History of falling: Secondary | ICD-10-CM | POA: Diagnosis not present

## 2022-12-31 DIAGNOSIS — Z7902 Long term (current) use of antithrombotics/antiplatelets: Secondary | ICD-10-CM | POA: Diagnosis not present

## 2022-12-31 DIAGNOSIS — I252 Old myocardial infarction: Secondary | ICD-10-CM | POA: Diagnosis not present

## 2022-12-31 DIAGNOSIS — Z4789 Encounter for other orthopedic aftercare: Secondary | ICD-10-CM | POA: Diagnosis not present

## 2022-12-31 NOTE — Telephone Encounter (Signed)
Pt c/o medication issue:  1. Name of Medication:  Losartan  2. How are you currently taking this medication (dosage and times per day)?   3. Are you having a reaction (difficulty breathing--STAT)?   4. What is your medication issue?  Patient called in with his nephew on the line stating that he hasn't taken Losartan in about 2 weeks and he just found it. He would like to know if Dr. Acie Fredrickson would like for him to start taking it again.

## 2022-12-31 NOTE — Telephone Encounter (Signed)
I called and gave [place] verbal auth for [facility]

## 2022-12-31 NOTE — Telephone Encounter (Signed)
[facility] home [facility]--requesting verbal orders..2x3.1wx3weeks.Marland Kitchen strengthen legs , gait and balance training and fall prevention. Pain management and stair training.. was seen Saturday and had not picked up pain meds and was told to get medication..( [name])--[phone]

## 2022-12-31 NOTE — Telephone Encounter (Signed)
Patient stated his losartan was d/c'd while he was admitted in the hospital on 2/17. He will like to know if he should start retaking the medication. Blood pressure readings 2/27 AM 136/71, 2/26 163/79, and 2/25 (121/56). Patient is asymptomatic. Will forward to MD and nurse.

## 2023-01-01 NOTE — Telephone Encounter (Signed)
[name], Wonda Cheng, MD  Donnalee Curry K Caller: Unspecified (Yesterday, 12:22 PM) Overall his BP looks ok Have him continue to hold the losartan . Keep a BP log We can discuss at next office visit  PN        Returned call and left message per DPR with above comments. Advised to call back if BP begins to trend upwards or needs to discuss further.

## 2023-01-02 ENCOUNTER — Ambulatory Visit (INDEPENDENT_AMBULATORY_CARE_PROVIDER_SITE_OTHER): Payer: Medicare Other | Admitting: Orthopedic Surgery

## 2023-01-02 DIAGNOSIS — I252 Old myocardial infarction: Secondary | ICD-10-CM | POA: Diagnosis not present

## 2023-01-02 DIAGNOSIS — M48062 Spinal stenosis, lumbar region with neurogenic claudication: Secondary | ICD-10-CM

## 2023-01-02 DIAGNOSIS — Z7902 Long term (current) use of antithrombotics/antiplatelets: Secondary | ICD-10-CM | POA: Diagnosis not present

## 2023-01-02 DIAGNOSIS — Z9181 History of falling: Secondary | ICD-10-CM | POA: Diagnosis not present

## 2023-01-02 DIAGNOSIS — Z4789 Encounter for other orthopedic aftercare: Secondary | ICD-10-CM | POA: Diagnosis not present

## 2023-01-02 NOTE — Progress Notes (Signed)
Orthopedic Surgery Post-operative Office Visit  Procedure: L3-5 laminectomies with medial facetectomies and foraminotomies complicated by dural tear that required return to the OR for repair Date of Surgery: 12/20/2022, 12/22/2022  Assessment: Patient is a 89 y.o. whose back pain is improving as an expected and is noting decreased leg pain when walking or upright   Plan: -Operative plans complete -Out of bed as tolerated, no brace -Okay to let soap and water run over the incision, do not submerge -No bending/lifting/twisting greater than 10 pounds -Pain management: Tylenol, Robaxin -Return to office in 4 weeks, lumbar x-rays needed at next visit: AP/lateral lumbar  ___________________________________________________________________________   Subjective: Patient is been doing well since discharge.  He is working with home health PT to increase his ambulation and lower extremity strength.  He is no longer getting cramping or shooting leg pains when ambulating or upright.  He feels that his back pain is slowly improving around incision.  He is not using any narcotics.  He feels his pain is well-controlled with Robaxin and Tylenol.  He has transitioned from using a walker to a cane.  He said he is taking care of himself at home.  He drove here to the office by himself today.  He feels that he has had symptomatic improvement with surgery.  Denies paresthesia numbness.  Has not noticed any drainage from his incision.  Has not had any headaches.  Objective:  General: no acute distress, appropriate affect Neurologic: alert, answering questions appropriately, following commands Respiratory: unlabored breathing on room air Skin: Erythema around the skin edges of the posterior incision, no active or expressible drainage, incision well-approximated, no tenderness palpation around the incision  MSK (spine):  -Strength exam      Left  Right  EHL    5/5  5/5 TA    5/5  5/5 GSC    5/5  5/5 Knee  extension  5/5  5/5 Hip flexion   5/5  5/5  -Sensory exam    Sensation intact to light touch in L3-S1 nerve distributions of bilateral lower extremities  Imaging: No images obtained today   Patient name: [name] Patient MRN: [id number] Date of visit: 01/02/23

## 2023-01-03 ENCOUNTER — Encounter: Payer: Medicare Other | Admitting: Orthopedic Surgery

## 2023-01-06 DIAGNOSIS — I252 Old myocardial infarction: Secondary | ICD-10-CM | POA: Diagnosis not present

## 2023-01-06 DIAGNOSIS — Z4789 Encounter for other orthopedic aftercare: Secondary | ICD-10-CM | POA: Diagnosis not present

## 2023-01-06 DIAGNOSIS — Z9181 History of falling: Secondary | ICD-10-CM | POA: Diagnosis not present

## 2023-01-06 DIAGNOSIS — Z7902 Long term (current) use of antithrombotics/antiplatelets: Secondary | ICD-10-CM | POA: Diagnosis not present

## 2023-01-09 DIAGNOSIS — I252 Old myocardial infarction: Secondary | ICD-10-CM | POA: Diagnosis not present

## 2023-01-09 DIAGNOSIS — Z7902 Long term (current) use of antithrombotics/antiplatelets: Secondary | ICD-10-CM | POA: Diagnosis not present

## 2023-01-09 DIAGNOSIS — Z4789 Encounter for other orthopedic aftercare: Secondary | ICD-10-CM | POA: Diagnosis not present

## 2023-01-09 DIAGNOSIS — Z9181 History of falling: Secondary | ICD-10-CM | POA: Diagnosis not present

## 2023-01-14 ENCOUNTER — Other Ambulatory Visit: Payer: Self-pay | Admitting: Cardiovascular Disease

## 2023-01-14 DIAGNOSIS — Z7902 Long term (current) use of antithrombotics/antiplatelets: Secondary | ICD-10-CM | POA: Diagnosis not present

## 2023-01-14 DIAGNOSIS — Z9181 History of falling: Secondary | ICD-10-CM | POA: Diagnosis not present

## 2023-01-14 DIAGNOSIS — Z4789 Encounter for other orthopedic aftercare: Secondary | ICD-10-CM | POA: Diagnosis not present

## 2023-01-14 DIAGNOSIS — I252 Old myocardial infarction: Secondary | ICD-10-CM | POA: Diagnosis not present

## 2023-01-16 DIAGNOSIS — Z7902 Long term (current) use of antithrombotics/antiplatelets: Secondary | ICD-10-CM | POA: Diagnosis not present

## 2023-01-16 DIAGNOSIS — Z9181 History of falling: Secondary | ICD-10-CM | POA: Diagnosis not present

## 2023-01-16 DIAGNOSIS — I252 Old myocardial infarction: Secondary | ICD-10-CM | POA: Diagnosis not present

## 2023-01-16 DIAGNOSIS — Z4789 Encounter for other orthopedic aftercare: Secondary | ICD-10-CM | POA: Diagnosis not present

## 2023-01-20 ENCOUNTER — Other Ambulatory Visit (INDEPENDENT_AMBULATORY_CARE_PROVIDER_SITE_OTHER): Payer: Medicare Other

## 2023-01-20 ENCOUNTER — Ambulatory Visit (INDEPENDENT_AMBULATORY_CARE_PROVIDER_SITE_OTHER): Payer: Medicare Other | Admitting: Orthopedic Surgery

## 2023-01-20 ENCOUNTER — Telehealth: Payer: Self-pay | Admitting: Orthopedic Surgery

## 2023-01-20 DIAGNOSIS — M5416 Radiculopathy, lumbar region: Secondary | ICD-10-CM

## 2023-01-20 MED ORDER — PREGABALIN 50 MG PO CAPS: 50.0000 mg | ORAL_CAPSULE | Freq: Three times a day (TID) | ORAL | 0 refills | Status: DC

## 2023-01-20 MED ORDER — METHYLPREDNISOLONE 4 MG PO TBPK: ORAL_TABLET | ORAL | 0 refills | Status: DC

## 2023-01-20 NOTE — Telephone Encounter (Signed)
Pt called requesting an immediate call back. Pt had spinal surgery 2/16 and back in on the 2/18. Pt states he is having severe pains and unable to hardly walk. Sever pains in left leg. Pt states when standing pain goes away. Pt is asking for a call from Dr Laurance Flatten or Alyse Low. Please call pt at [phone].

## 2023-01-20 NOTE — Progress Notes (Signed)
Orthopedic Surgery Post-operative Office Visit   Procedure: L3-5 laminectomies with medial facetectomies and foraminotomies complicated by dural tear that required return to the OR for repair Date of Surgery: 12/20/2022, 12/22/2022 (~1 month post-op)   Assessment: Patient is a 89 y.o. male who has had acute onset of left lower extremity pain in the posterior left thigh and leg in an S1 distribution.  No similar right-sided symptoms.  Pain is only felt when going from sitting to standing position.     Plan: -Operative plans complete -Out of bed as tolerated, no brace -Okay to let soap and water run over the incision, do not submerge -No bending/lifting/twisting greater than 10 pounds -Recommended symptomatic treatment at this time.  Prescribed Lyrica and Medrol Dosepak -His durotomy was on the right side in the lateral recess and he did not have the symptoms immediately after surgery so I do not think there is a nerve root herniation but will continue to monitor his symptoms.  Told him he may have to get an MRI if his symptoms do not resolve -Patient has scheduled follow-up in 2 weeks.  I told him to keep that follow-up and that I would call him at the end of this week to see how he is doing.  I told him to call the office if he has further issues as well as then may have to bring him in again talk further over the phone   ___________________________________________________________________________     Subjective: Patient had been doing well after surgery.  He had been walking around without radiating leg pain.  He felt his leg pain had improved after surgery.  He was even out chipping and putting and running around the golf cart within the last week.  Then, within the last 3 days, he has noticed onset of left posterior leg pain.  He feels it in the posterior thigh and posterior leg.  It goes into his heel.  He has paresthesias on the plantar aspect of his left foot.  He only notices these left  lower extremity symptoms when he goes from a sitting to standing position.  If he is sitting or standing then he is not having any lower extremity symptoms.  It is only during that transition that he gets significant pain in his left leg.  He has no symptoms on the right side.  He has not had any headaches since being in the hospital.   Objective:   General: no acute distress, appropriate affect Neurologic: alert, answering questions appropriately, following commands Respiratory: unlabored breathing on room air Skin: Incision is well-approximated, there is no erythema, no induration, no active or expressible drainage   MSK (spine):   -Strength exam                                                   Left                  Right   EHL                              5/5                  5/5 TA  5/5                  5/5 GSC                             5/5                  5/5 Knee extension            5/5                  5/5 Hip flexion                    5/5                  5/5   -Sensory exam                           Sensation intact to light touch in L3-S1 nerve distributions of bilateral lower extremities   Imaging: XR of the lumbar spine taken on 01/20/2023 was independently reviewed and interpreted showing laminectomy defect from L3-L5.  Disc height loss at L2/3.  No fracture or dislocation seen.  No spondylolisthesis seen.     Patient name: Nyheem Maduro Patient MRN: [id number] Date of visit: 01/20/23

## 2023-01-21 DIAGNOSIS — Z9181 History of falling: Secondary | ICD-10-CM | POA: Diagnosis not present

## 2023-01-21 DIAGNOSIS — I252 Old myocardial infarction: Secondary | ICD-10-CM | POA: Diagnosis not present

## 2023-01-21 DIAGNOSIS — Z4789 Encounter for other orthopedic aftercare: Secondary | ICD-10-CM | POA: Diagnosis not present

## 2023-01-21 DIAGNOSIS — Z7902 Long term (current) use of antithrombotics/antiplatelets: Secondary | ICD-10-CM | POA: Diagnosis not present

## 2023-01-24 ENCOUNTER — Telehealth: Payer: Self-pay | Admitting: Orthopedic Surgery

## 2023-01-24 NOTE — Telephone Encounter (Signed)
Orthopedic Telephone Note  Spoke to patient this morning.  Still having similar pain to what he described in the office. No new symptoms.  He is getting pain in his left lower extremity along the posterior aspect down to the heel when he changes position.  He also notes that it if he is flexing at the lumbar spine.  If he is standing or sitting, he does not notice the pain.  Still not having pain when he is ambulating.  He still has a couple more tablets of the Medrol Dosepak and has been taking Lyrica.  I instructed him to increase the dose of the Lyrica.  Told him I would call him back on Monday to see how he is doing after completing the Medrol Dosepak and increasing the Lyrica.  Callie Fielding, MD Orthopedic Surgeon

## 2023-01-27 ENCOUNTER — Telehealth: Payer: Self-pay | Admitting: Orthopedic Surgery

## 2023-01-27 NOTE — Telephone Encounter (Signed)
Orthopedic Telephone Call  Spoke to the patient this evening to check in on his symptoms.  He has now completed the course of prednisone and has increased the dose of the Lyrica.  He states that he feels his pain is gotten noticeably better since we last spoke on the phone.  He is still having the pain going down the back of the leg particularly when going from sitting to standing.  It is more manageable now.  He is now having greater ease with getting his socks on or flexing at the lumbar spine.  Since he is getting symptomatic improvement, I recommended continued observation at this time.  I told him to keep taking the dose of Lyrica and I can refill it if need be.  I told him that I would check in with him again on Friday to see how he is doing.  He said he would call me in the interim if there were any changes.  Callie Fielding, MD Orthopedic Surgeon

## 2023-01-28 DIAGNOSIS — Z4789 Encounter for other orthopedic aftercare: Secondary | ICD-10-CM | POA: Diagnosis not present

## 2023-01-28 DIAGNOSIS — I252 Old myocardial infarction: Secondary | ICD-10-CM | POA: Diagnosis not present

## 2023-01-28 DIAGNOSIS — Z7902 Long term (current) use of antithrombotics/antiplatelets: Secondary | ICD-10-CM | POA: Diagnosis not present

## 2023-01-28 DIAGNOSIS — Z9181 History of falling: Secondary | ICD-10-CM | POA: Diagnosis not present

## 2023-01-31 ENCOUNTER — Other Ambulatory Visit: Payer: Self-pay | Admitting: Cardiovascular Disease

## 2023-02-03 ENCOUNTER — Telehealth: Payer: Self-pay | Admitting: Orthopedic Surgery

## 2023-02-03 DIAGNOSIS — Z4789 Encounter for other orthopedic aftercare: Secondary | ICD-10-CM | POA: Diagnosis not present

## 2023-02-03 DIAGNOSIS — Z7902 Long term (current) use of antithrombotics/antiplatelets: Secondary | ICD-10-CM | POA: Diagnosis not present

## 2023-02-03 DIAGNOSIS — I252 Old myocardial infarction: Secondary | ICD-10-CM | POA: Diagnosis not present

## 2023-02-03 DIAGNOSIS — Z9181 History of falling: Secondary | ICD-10-CM | POA: Diagnosis not present

## 2023-02-03 NOTE — Telephone Encounter (Signed)
Orthopedic Telephone Note  Patient has been doing well since the last time I spoke to him on the phone.  He states that he is not having as many "zingers" into the left leg.  They are much less significant in terms of intensity as well.  He states for the last day he has not had any.  He has been able to put on his shoes without issue.  He states that he went to the gym twice within the last couple of days.  [place] talk about getting back into work.  Since he has been doing better and he was having side effects, he stopped Lyrica on his own a couple days ago.  Has not had any headaches.  He does not have a follow-up currently scheduled so I told him I should plan to see him for his roughly 6-week follow-up.  We discussed him coming back to the office on 02/13/2023 at 9:45 AM.  I will work with my office to get him an appointment at that time.  Callie Fielding, MD  Orthopedic Surgeon

## 2023-02-03 NOTE — Telephone Encounter (Signed)
Pt returned call to Dr Laurance Flatten. Pt phone number is [phone]

## 2023-02-13 ENCOUNTER — Ambulatory Visit: Payer: Medicare Other | Admitting: Orthopedic Surgery

## 2023-02-13 DIAGNOSIS — Z9889 Other specified postprocedural states: Secondary | ICD-10-CM

## 2023-02-13 NOTE — Progress Notes (Signed)
Orthopedic Surgery Post-operative Office Visit   Procedure: L3-5 laminectomies with medial facetectomies and foraminotomies complicated by dural tear that required return to the OR for repair Date of Surgery: 12/20/2022, 12/22/2022 (~2 months post-op)   Assessment: Patient is a 89 y.o. male who is doing well after surgery.  Not having any back or leg pain     Plan: -Operative plans complete -Out of bed as tolerated, no brace -Okay to let soap and water run over the incision -No spine specific restrictions at this time -Pain control: OTC medications -Return to office in 4 weeks (is going on vacation so will be closer to 6 weeks), XRs at next visit: AP/lateral/flex/ex lumbar   ___________________________________________________________________________     Subjective: Patient has been doing well since the last time I spoke to him on the phone.  He states he is now able to put on his socks without issue.  States even clipped his toenails this morning and did not have any pain rating down the left leg.  He is not taking any pain medication.  He has been going back to the gym.  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  He had help with lifting the heavier objects but was able to do everything else at his job.  Denies paresthesias or numbness.  Has not had any headaches.  No fevers or chills.   Objective:   General: no acute distress, appropriate affect Neurologic: alert, answering questions appropriately, following commands Respiratory: unlabored breathing on room air Skin: Incision is well-healed with no evidence of infection   MSK (spine):   -Strength exam                                                   Left                  Right   EHL                              5/5                  5/5 TA                                 5/5                  5/5 GSC                             5/5                  5/5 Knee extension            5/5                  5/5 Hip flexion                     5/5                  5/5   -Sensory exam  Sensation intact to light touch in L3-S1 nerve distributions of bilateral lower extremities   Imaging: None obtained today     Patient name: [name] Patient MRN: [id number] Date of visit: 02/13/23

## 2023-02-20 ENCOUNTER — Ambulatory Visit (INDEPENDENT_AMBULATORY_CARE_PROVIDER_SITE_OTHER): Payer: Medicare Other | Admitting: Nurse Practitioner

## 2023-02-20 ENCOUNTER — Encounter: Payer: Self-pay | Admitting: Nurse Practitioner

## 2023-02-20 VITALS — BP 136/62 | HR 54 | Temp 97.6°F | Resp 18 | Ht 69.0 in | Wt 161.0 lb

## 2023-02-20 DIAGNOSIS — G8929 Other chronic pain: Secondary | ICD-10-CM

## 2023-02-20 DIAGNOSIS — N1831 Chronic kidney disease, stage 3a: Secondary | ICD-10-CM | POA: Diagnosis not present

## 2023-02-20 DIAGNOSIS — I10 Essential (primary) hypertension: Secondary | ICD-10-CM | POA: Diagnosis not present

## 2023-02-20 DIAGNOSIS — E1165 Type 2 diabetes mellitus with hyperglycemia: Secondary | ICD-10-CM | POA: Diagnosis not present

## 2023-02-20 DIAGNOSIS — E785 Hyperlipidemia, unspecified: Secondary | ICD-10-CM

## 2023-02-20 DIAGNOSIS — I779 Disorder of arteries and arterioles, unspecified: Secondary | ICD-10-CM

## 2023-02-20 DIAGNOSIS — E1169 Type 2 diabetes mellitus with other specified complication: Secondary | ICD-10-CM | POA: Diagnosis not present

## 2023-02-20 DIAGNOSIS — M5442 Lumbago with sciatica, left side: Secondary | ICD-10-CM

## 2023-02-20 NOTE — Patient Instructions (Signed)
Schedule  fasting lab appt. Maintain current medications

## 2023-02-20 NOTE — Assessment & Plan Note (Signed)
Repeat lipid panel ?

## 2023-02-20 NOTE — Assessment & Plan Note (Signed)
Repeat hgbA1c 

## 2023-02-20 NOTE — Assessment & Plan Note (Signed)
-   Repeat BMP

## 2023-02-20 NOTE — Progress Notes (Signed)
Established Patient Visit  Patient: [name]   DOB: 01/09/1930   89 y.o. Male  MRN: [id number] Visit Date: 02/20/2023  Subjective:    Chief Complaint  Patient presents with   Follow-up   HPI  Bilateral carotid artery disease, unspecified type [facility]) Under the care of cardiology: Dr. Elease Hashimoto Right carotid-moderate plaque.  The external right carotid appears to be greater than 50% stenosis. Left internal carotid artery has moderate plaque. Follow-up carotid duplex scan in 12 months.  Hyperlipidemia associated with type 2 diabetes mellitus (HCC) Repeat lipid panel  DM (diabetes mellitus) (HCC) Repeat hgbA1c  CKD (chronic kidney disease) stage 3, GFR 30-59 ml/min Repeat BMP  Chronic left-sided low back pain with left-sided sciatica Improved significantly post laminectomy 12/2022  Reviewed medical, surgical, and social history today  Medications: Outpatient Medications Prior to Visit  Medication Sig   amLODipine (NORVASC) 5 MG tablet Take 1 tablet by mouth once daily   clobetasol cream (TEMOVATE) 0.05 % Apply 1 Application topically 2 (two) times daily.   clopidogrel (PLAVIX) 75 MG tablet Take 1 tablet by mouth once daily   Dermatological Products, Misc. [facility] EX) Apply 1 Application topically 2 (two) times daily.   fish oil-omega-3 fatty acids 1000 MG capsule Take 1 g by mouth daily.   GLUCOSAMINE-CHONDROITIN PO Take 1 tablet by mouth daily.   levothyroxine (SYNTHROID) 88 MCG tablet Take 1 tablet (88 mcg total) by mouth daily before breakfast.   pravastatin (PRAVACHOL) 40 MG tablet Take 1 tablet by mouth once daily   pregabalin (LYRICA) 50 MG capsule Take 1 capsule (50 mg total) by mouth 3 (three) times daily.   [DISCONTINUED] methylPREDNISolone (MEDROL DOSEPAK) 4 MG TBPK tablet Take as prescribed on the box   losartan (COZAAR) 50 MG tablet Take 1 tablet (50 mg total) by mouth daily. (Patient not taking: Reported on 12/30/2022)   No  facility-administered medications prior to visit.   Reviewed past medical and social history.   ROS per HPI above      Objective:  BP 136/62   Pulse (!) 54   Temp 97.6 F (36.4 C) (Oral)   Resp 18   Ht  (1.753 m)   Wt 161 lb (73 kg)   SpO2 96%   BMI 23.78 kg/m      Physical Exam Vitals and nursing note reviewed.  Cardiovascular:     Rate and Rhythm: Normal rate and regular rhythm.     Pulses: Normal pulses.     Heart sounds: Normal heart sounds.  Pulmonary:     Effort: Pulmonary effort is normal.     Breath sounds: Normal breath sounds.  Musculoskeletal:     Right lower leg: No edema.     Left lower leg: No edema.  Neurological:     Mental Status: He is alert and oriented to person, place, and time.  Psychiatric:        Mood and Affect: Mood normal.        Behavior: Behavior normal.        Thought Content: Thought content normal.     No results found for any visits on 02/20/23.    Assessment & Plan:    Problem List Items Addressed This Visit       Cardiovascular and Mediastinum   Bilateral carotid artery disease, unspecified type    Under the care of cardiology: Dr. Elease Hashimoto Right carotid-moderate plaque.  The  external right carotid appears to be greater than 50% stenosis. Left internal carotid artery has moderate plaque. Follow-up carotid duplex scan in 12 months.      Hypertension - Primary     Endocrine   DM (diabetes mellitus)    Repeat hgbA1c      Relevant Orders   Hemoglobin A1c   Renal Function Panel   Microalbumin / creatinine urine ratio   Hyperlipidemia associated with type 2 diabetes mellitus    Repeat lipid panel      Relevant Orders   Lipid panel     Nervous and Auditory   Chronic left-sided low back pain with left-sided sciatica    Improved significantly post laminectomy 12/2022        Genitourinary   CKD (chronic kidney disease) stage 3, GFR 30-59 ml/min    Repeat BMP      Relevant Orders   Renal Function Panel    Microalbumin / creatinine urine ratio   Return in about 6 months (around 08/22/2023) for HTN, DM, Hypothyroidism, hyperlipidemia (fasting).     Alysia Penna, NP

## 2023-02-20 NOTE — Assessment & Plan Note (Signed)
Under the care of cardiology: Dr. Elease Hashimoto Right carotid-moderate plaque.  The external right carotid appears to be greater than 50% stenosis. Left internal carotid artery has moderate plaque. Follow-up carotid duplex scan in 12 months.

## 2023-02-20 NOTE — Assessment & Plan Note (Addendum)
Improved significantly post laminectomy 12/2022 Denies any complications from recent laminectomy. He has returned to normal daily activities without restrictions.

## 2023-02-21 ENCOUNTER — Other Ambulatory Visit (INDEPENDENT_AMBULATORY_CARE_PROVIDER_SITE_OTHER): Payer: Medicare Other

## 2023-02-21 DIAGNOSIS — E785 Hyperlipidemia, unspecified: Secondary | ICD-10-CM | POA: Diagnosis not present

## 2023-02-21 DIAGNOSIS — E1165 Type 2 diabetes mellitus with hyperglycemia: Secondary | ICD-10-CM | POA: Diagnosis not present

## 2023-02-21 DIAGNOSIS — E1169 Type 2 diabetes mellitus with other specified complication: Secondary | ICD-10-CM

## 2023-02-21 DIAGNOSIS — N1831 Chronic kidney disease, stage 3a: Secondary | ICD-10-CM

## 2023-02-21 LAB — LIPID PANEL
Cholesterol: 137 mg/dL (ref 0–200)
HDL: 30.8 mg/dL — ABNORMAL LOW (ref >39.00)
LDL Cholesterol: 81 mg/dL (ref 0–99)
NonHDL: 106.05
Total CHOL/HDL Ratio: 4
Triglycerides: 124 mg/dL (ref 0.0–149.0)
VLDL: 24.8 mg/dL (ref 0.0–40.0)

## 2023-02-21 LAB — HEMOGLOBIN A1C: Hgb A1c MFr Bld: 6.4 % (ref 4.6–6.5)

## 2023-02-21 LAB — RENAL FUNCTION PANEL
Albumin: 4.1 g/dL (ref 3.5–5.2)
BUN: 19 mg/dL (ref 6–23)
CO2: 26 mEq/L (ref 19–32)
Calcium: 9.2 mg/dL (ref 8.4–10.5)
Chloride: 106 mEq/L (ref 96–112)
Creatinine, Ser: 1.17 mg/dL (ref 0.40–1.50)
GFR: 54.96 mL/min — ABNORMAL LOW (ref >60.00)
Glucose, Bld: 111 mg/dL — ABNORMAL HIGH (ref 70–99)
Phosphorus: 3.1 mg/dL (ref 2.3–4.6)
Potassium: 4.4 mEq/L (ref 3.5–5.1)
Sodium: 140 mEq/L (ref 135–145)

## 2023-02-21 LAB — MICROALBUMIN / CREATININE URINE RATIO
Creatinine,U: 77 mg/dL
Microalb Creat Ratio: 0.9 mg/g (ref 0.0–30.0)
Microalb, Ur: <0.7 mg/dL (ref 0.0–1.9)

## 2023-02-21 NOTE — Progress Notes (Signed)
Stable,  maintain current med doses

## 2023-03-04 ENCOUNTER — Telehealth: Payer: Self-pay | Admitting: Orthopedic Surgery

## 2023-03-04 MED ORDER — METHYLPREDNISOLONE 4 MG PO TBPK: ORAL_TABLET | ORAL | 0 refills | Status: DC

## 2023-03-04 NOTE — Addendum Note (Signed)
Addended by: Willia Craze on: 03/04/2023 04:24 PM   Modules accepted: Orders

## 2023-03-04 NOTE — Telephone Encounter (Signed)
Mr. Grove called in to advise Dr. Christell Constant that he is having pain in his left leg again, "just like before".  He would like to know if Dr. Christell Constant wants him to come in for another appt or if he can call in an Rx to his pharmacy.  He uses the [facility] on [address].  His call back # is [phone].

## 2023-03-05 ENCOUNTER — Telehealth: Payer: Self-pay | Admitting: Orthopedic Surgery

## 2023-03-05 NOTE — Telephone Encounter (Signed)
Patient states he missed a call from Dr. Christell Constant.

## 2023-03-08 ENCOUNTER — Encounter: Payer: Self-pay | Admitting: Cardiovascular Disease

## 2023-03-08 NOTE — Progress Notes (Unsigned)
[name] Date of Birth  1930-09-27  [address]    [address] [place], [place]  [address] [phone]  Problem List: 1. CAD - PCI of RCA March, 2012 2. RBBB 3. Hypothyroidism 4. Carotid bruits 5. hyperlipidemia   Mr. Trom is an elderly gentleman with a history of coronary artery disease. He status post PTCA and stenting of the mid-right coronary artery in April of this year. He's done well since I last saw him. He's not had any episodes of chest pain or shortness of breath. He's been tolerating his medications without any difficulty.  No angina. No dyspnea.  Exercising regularly - golf 1 day a week. Works out in [employer] 3 days a week ( including spin class)  Dec. 5, 2016:  Was last seen in 2013. Recently saw [name]. 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  Sharp pain . Has not carried any NTG in years.  Still working out regularly - without any angina .   Works out for 2 hours. Still playing golf several days a week.   Dec. 5, 2017:  Doing well. Played 27 holes of golf yesterday. Goes to the gym 3 days a week - spin class.   Jan. 16, 2019:  [place] is seen back today for follow-up of his coronary artery disease and carotid artery disease.  He had a carotid duplex scan in December, 2018 which reveals moderate bilateral carotid disease.  He will be scheduled to repeat this in 1 year.  Fasting labs recently reveal normal electrolytes and a stable creatinine at 1.29.  His liver enzymes are normal.  Total cholesterol is 126.  His HDL is stable at 30.  LDL is 67.  Triglycerides are 146.  Still exercising at [employer] 3 days a week.   Plays golf regularly  No CP or dyspnea.   Jan. 16 , 2020  Andrejs is seen today for a 1 year office visit Has a hx of CAD  With PCI of his RCA in March, 2012 Has moderate bilateral carotid artery disease  No syncope Has occasional episodes of upper abdominal /  lower chest pain He thinks its indigestion - is relieved with antiacids He exercises on a regular basis and these pains are not brought on by exercise. Lasts for a few seconds  Goes to [employer] regularly   Jan. 20, 2021:  Rahim is seen today for follow up of his CAD.   S/p PCI of his RCA in March , 2012. Carotid duplex scan in Dec. 2020 showed moderate R carotid disease and mild L carotid stenosis.  No CP, no dyspnea.  No syncope or presyncope  Jan. 21, 2022:  [place] is seen for follow up of his CAD  Still working out at the [employer] .  No CP , no dyspnea   November 20, 2021: Nadine Counts is seen today for follow-up visit.  He has a history of coronary artery disease. His wife of 36 years passed away several months ago . No cp,  no dyspnea,  still working out at the gym   November 28, 2022: Nadine Counts is seen today for follow-up visit for his coronary artery disease, aortic stenosis .  He remains very active. No cp or dyspnea  BP is a bit elevated today .  He still eats salt  Still going to the gym  Having some back issues .  Needs to  have  L3-4, L4-5 LAMINECTOMY AND FORAMINOTOMIES    He has moderate aortic stenosis.   He has no CP or syncope with exercise .   he will be at low risk for his surgery .  He may hold his Plavix for 5-7 days prior to back surgery. Restart when ok with the surgical team  Echocardiogram from January, 2023 reveals normal left ventricular systolic function.  He has moderate aortic stenosis.  His mean aortic valve gradient is 26 mmHg   Mar 11, 2023 Nadine Counts is seen for follow up of his aortic stenosis, CAD Had recent back surgery in Feb.   No cp , no dyspnea Still very active .  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     clopidogrel (PLAVIX) 75 MG tablet Take 1 tablet by mouth once daily 90 tablet 3   Dermatological Products, Misc. [facility]) Apply 1 Application topically 2 (two) times daily.     fish oil-omega-3 fatty acids 1000 MG capsule Take 1 g by mouth daily.     GLUCOSAMINE-CHONDROITIN PO Take 1 tablet by mouth daily.     levothyroxine (SYNTHROID) 88 MCG tablet Take 1 tablet (88 mcg total) by mouth daily before breakfast. 90 tablet 1   losartan (COZAAR) 50 MG tablet Take 1 tablet (50 mg total) by mouth daily. 90 tablet 3   pravastatin (PRAVACHOL) 40 MG tablet Take 1 tablet by mouth once daily 90 tablet 3   pregabalin (LYRICA) 50 MG capsule Take 1 capsule (50 mg total) by mouth 3 (three) times daily. 90 capsule 0   methylPREDNISolone (MEDROL DOSEPAK) 4 MG TBPK tablet Take as prescribed on the box (Patient not taking: Reported on 03/11/2023) 21 tablet 0   No current facility-administered medications on file prior to visit.    No Known Allergies  Past Medical History:  Diagnosis Date   Coronary artery disease    PCI, STent to RCA   Diabetes mellitus without complication (HCC)    Hyperlipidemia    Hypothyroidism    Rash    Thyroid disease    hypothyroidism    Past Surgical History:  Procedure Laterality Date   APPENDECTOMY     CARDIAC CATHETERIZATION     2007 occluded ramus inter. , PCI to RCA   CARPAL TUNNEL RELEASE     DECOMPRESSIVE LUMBAR LAMINECTOMY LEVEL 2 N/A 12/20/2022   Procedure: LUMBAR THREE THROUGH FIVE LAMINECTOMIES AND FORAMINOTOMIES;  Surgeon: London Sheer, MD;  Location: [facility] OR;  Service: Orthopedics;  Laterality: N/A;   EYE SURGERY Bilateral    cataract surgery   LUMBAR LAMINECTOMY/DECOMPRESSION MICRODISCECTOMY N/A 12/22/2022   Procedure: POSTERIOR LUMBAR DURAL REPAIR;  Surgeon: London Sheer, MD;  Location: [facility] OR;  Service: Orthopedics;  Laterality: N/A;   TONSILLECTOMY       Social History   Tobacco Use  Smoking Status Former   Types: Cigarettes   Quit date: 11/04/1960   Years since quitting: 62.3  Smokeless Tobacco Never    Social History   Substance and Sexual Activity  Alcohol Use No    Family History  Problem Relation Age of Onset  Hypertension Mother    Hypertension Father    Allergic rhinitis Neg Hx    Angioedema Neg Hx    Asthma Neg Hx    Eczema Neg Hx    Immunodeficiency Neg Hx    Urticaria Neg Hx     Reviw of Systems:  Reviewed in the HPI.  All other systems are negative.  Physical Exam:  Physical Exam: Blood pressure (!) 130/55, pulse (!) 57, height 5\' 9"  (1.753 m), weight 162 lb (73.5 kg), SpO2 99 %.       GEN:  elderly male,  thnk Well nourished, well developed in no acute distress HEENT: Normal NECK: No JVD; No carotid bruits LYMPHATICS: No lymphadenopathy CARDIAC: RRR 2-3/6 systolic murmur c/w AS  RESPIRATORY:  Clear to auscultation without rales, wheezing or rhonchi  ABDOMEN: Soft, non-tender, non-distended MUSCULOSKELETAL:  No edema; No deformity  SKIN: Warm and dry NEUROLOGIC:  Alert and oriented x 3    ECG:     Assessment / Plan:   1. CAD -       no CP or dyspnea    2. RBBB -      3. Aortic stenosis:     stable ,  cont to follow .  He would be a good candidate for TAVR when the AS becomes more severe    4. Carotid bruits -       5. Hyperlipidemia-    Lipids are stable,   cont current meds.  His last LDL is 81  Cont current meds      6. HTN:   He still eats a fair amount of extra salt.  I encouraged him to reduce his salt intake.  Continue current medications.  Continue to monitor.  [name], MD  03/11/2023 5:56 PM    [facility] [address] [place],  [address] [place], [place]  [address] Pager [phone] Phone: [phone]; Fax: [phone]

## 2023-03-11 ENCOUNTER — Ambulatory Visit: Payer: Medicare Other | Attending: Cardiovascular Disease | Admitting: Cardiovascular Disease

## 2023-03-11 ENCOUNTER — Encounter: Payer: Self-pay | Admitting: Cardiovascular Disease

## 2023-03-11 VITALS — BP 130/55 | HR 57 | Ht 69.0 in | Wt 162.0 lb

## 2023-03-11 DIAGNOSIS — I2510 Atherosclerotic heart disease of native coronary artery without angina pectoris: Secondary | ICD-10-CM

## 2023-03-11 DIAGNOSIS — I10 Essential (primary) hypertension: Secondary | ICD-10-CM

## 2023-03-11 DIAGNOSIS — I350 Nonrheumatic aortic (valve) stenosis: Secondary | ICD-10-CM

## 2023-03-11 NOTE — Patient Instructions (Signed)
Medication Instructions:  Your physician recommends that you continue on your current medications as directed. Please refer to the Current Medication list given to you today.  *If you need a refill on your cardiac medications before your next appointment, please call your pharmacy*   Lab Work: NONE If you have labs (blood work) drawn today and your tests are completely normal, you will receive your results only by: MyChart Message (if you have MyChart) OR A paper copy in the mail If you have any lab test that is abnormal or we need to change your treatment, we will call you to review the results.   Testing/Procedures: none   Follow-Up: At [facility], you and your health needs are our priority.  As part of our continuing mission to provide you with exceptional heart care, we have created designated Provider Care Teams.  These Care Teams include your primary Cardiologist (physician) and Advanced Practice Providers (APPs -  Physician Assistants and Nurse Practitioners) who all work together to provide you with the care you need, when you need it.  We recommend signing up for the patient portal called "MyChart".  Sign up information is provided on this After Visit Summary.  MyChart is used to connect with patients for Virtual Visits (Telemedicine).  Patients are able to view lab/test results, encounter notes, upcoming appointments, etc.  Non-urgent messages can be sent to your provider as well.   To learn more about what you can do with MyChart, go to [email or web address].    Your next appointment:   1 year(s)  Provider:   [name], MD

## 2023-03-12 ENCOUNTER — Telehealth: Payer: Self-pay | Admitting: Orthopedic Surgery

## 2023-03-12 NOTE — Telephone Encounter (Signed)
Patient called in stating he is still having issuers with leg advised him he should make an appt and he wanted to speak with Dr. Christell Constant first

## 2023-03-24 ENCOUNTER — Telehealth: Payer: Self-pay | Admitting: Orthopedic Surgery

## 2023-03-24 MED ORDER — PREGABALIN 75 MG PO CAPS: 75.0000 mg | ORAL_CAPSULE | Freq: Three times a day (TID) | ORAL | 0 refills | Status: DC

## 2023-03-24 NOTE — Telephone Encounter (Signed)
Pt called requesting a call back from Dr Christell Constant about his back. Pt states he don't need an appt just a conversation. Pt has questions. Please call pt at [phone].

## 2023-03-24 NOTE — Telephone Encounter (Signed)
Orthopedic Telephone Call  Spoke to patient this evening. He has been having pain radiating down the posterior aspect of his left leg, like had previously described. He says it is worse with change in position from sitting to standing. He does not have any pain when standing or sitting. It is just occurs when he makes that transition. It can last for awhile after her gets up but will then dissipate. Sometimes, the pain is severe, but other times it is mild. He still has been able to golf and work [place] the ball park because he is standing during those activities. He is not having any fevers/chills, headaches, nausea, photophobia. I told him that he did have a disc herniation at L3/4 that was bigger on the left side and that may be one potential cause. The other would be nerve root that is irritated or herniating at the durotomy site. I talked about his options which would repeat MRI for diagnostic purposes. If it shows one of the two potential etiologies that I discussed above, then revision surgery could be done. I also told him I could treat him symptomatically with lyrica or an injection with Dr. Alvester Morin. He wanted me to prescribe lyrica so that was done today. I did not mention it, but we could also try an abdominal binder in the future. I will bring that up at our next visit. I made plans with him over the phone to see him back in the office on June 3rd at 2pm. I told him if there are any issues, worsening pain, or new symptoms to call the office so we can talk again before that appointment. Patient expressed understanding and agreement with the plan.   London Sheer, MD Orthopedic Surgeon

## 2023-04-07 ENCOUNTER — Other Ambulatory Visit (INDEPENDENT_AMBULATORY_CARE_PROVIDER_SITE_OTHER): Payer: Medicare Other

## 2023-04-07 ENCOUNTER — Ambulatory Visit: Payer: Medicare Other | Admitting: Orthopedic Surgery

## 2023-04-07 DIAGNOSIS — Z9889 Other specified postprocedural states: Secondary | ICD-10-CM

## 2023-04-07 NOTE — Progress Notes (Signed)
3 Months Post-operative Scores   ODI: 0 VAS back: 0 VAS leg: 0 SF-36:             -Physical functioning: 80             -Role limitations due to physical health: 25             -Role limitations due to emotional problems: 100             -Energy/fatigue: 70             -Emotional well being: 32             -Social functioning: 50             -Pain: 45             -General health: 75    Pre-operative Scores   ODI: 8 VAS back: 0 VAS leg: 8 SF-36:             -Physical functioning: 35             -Role limitations due to physical health: 0             -Role limitations due to emotional problems: 100             -Energy/fatigue: 70             -Emotional well being: 33             -Social functioning: 87.5             -Pain: 22.5             -General health: 100    London Sheer, MD Orthopedic Surgeon

## 2023-04-07 NOTE — Progress Notes (Signed)
Orthopedic Surgery Post-operative Office Visit   Procedure: L3-5 laminectomies with medial facetectomies and foraminotomies complicated by dural tear that required return to the OR for repair Date of Surgery: 12/20/2022, 12/22/2022 (~3.5 months post-op)   Assessment: Patient is a 89 y.o. male who is doing well after surgery.  Not having any back or leg pain     Plan: -Operative plans complete -Out of bed as tolerated, no brace -Okay to submerge wound at this time -No spine specific restrictions at this time -Pain control: OTC medications -Will prescribe Lyrica again if radiating pain down the left leg returns -Return to office in 3 months, XRs at next visit: AP/lateral/flex/ex lumbar   ___________________________________________________________________________     Subjective: Spoke with the patient on 03/24/2023 as he was having pain radiating down the left leg.  It was severe in nature.  I prescribed him Lyrica at that time.  He said he took 2 of the Lyrica tablets and woke up the next day and his leg pain had resolved completely.  He is not having any pain radiating into either lower extremity.  He has been playing golf and working at [employer].  He is no longer having pain radiating into his legs with walking or upright position.  He is not using any ambulatory assistive devices.  He has not noticed any redness or drainage around his incision.  He has not had any headaches.     Objective:   General: no acute distress, appropriate affect Neurologic: alert, answering questions appropriately, following commands Respiratory: unlabored breathing on room air Skin: Incision is well-healed with no erythema, induration, palpable fluctuance, active/expressible drainage   MSK (spine):   -Strength exam                                                   Left                  Right   EHL                              5/5                  5/5 TA                                 5/5                   5/5 GSC                             5/5                  5/5 Knee extension            5/5                  5/5 Hip flexion                    5/5                  5/5   -Sensory exam  Sensation intact to light touch in L3-S1 nerve distributions of bilateral lower extremities   Imaging: X-rays of the lumbar spine from 04/07/2023 were independently reviewed and interpreted, showing laminectomy defect from L3-L5.  No fracture or dislocation seen.  No evidence of instability on flexion/extension views.  Disc height loss with anterior osteophyte formation at L2/3.     Patient name: [name] Patient MRN: [id number] Date of visit: 04/07/23

## 2023-04-10 DIAGNOSIS — H5211 Myopia, right eye: Secondary | ICD-10-CM | POA: Diagnosis not present

## 2023-04-10 LAB — HM DIABETES EYE EXAM

## 2023-05-07 ENCOUNTER — Other Ambulatory Visit: Payer: Self-pay | Admitting: Orthopedic Surgery

## 2023-06-25 ENCOUNTER — Other Ambulatory Visit: Payer: Self-pay | Admitting: Nurse Practitioner

## 2023-06-25 DIAGNOSIS — E039 Hypothyroidism, unspecified: Secondary | ICD-10-CM

## 2023-06-26 ENCOUNTER — Ambulatory Visit: Payer: Medicare Other | Admitting: Nurse Practitioner

## 2023-06-30 ENCOUNTER — Encounter: Payer: Self-pay | Admitting: Nurse Practitioner

## 2023-06-30 ENCOUNTER — Ambulatory Visit: Payer: Medicare Other | Admitting: Nurse Practitioner

## 2023-06-30 VITALS — BP 110/60 | HR 57 | Temp 98.0°F | Ht 69.0 in | Wt 161.0 lb

## 2023-06-30 DIAGNOSIS — B354 Tinea corporis: Secondary | ICD-10-CM

## 2023-06-30 DIAGNOSIS — R21 Rash and other nonspecific skin eruption: Secondary | ICD-10-CM | POA: Insufficient documentation

## 2023-06-30 NOTE — Progress Notes (Signed)
Established Patient Visit  Patient: [name]   DOB: 04/22/1930   89 y.o. Male  MRN: [id number] Visit Date: 06/30/2023  Subjective:    Chief Complaint  Patient presents with   Rash    Both legs  Had eye exam in April- Letter sent to [facility]    Rash This is a new (3months ago) problem. The current episode started more than 1 month ago. The problem has been gradually worsening since onset. The affected locations include the left upper leg, left lower leg, right upper leg and right lower leg. The rash is characterized by dryness and scaling. He was exposed to nothing. Pertinent negatives include no fatigue, fever or joint pain. Past treatments include nothing. There is no history of allergies, asthma, eczema or varicella.   Reviewed medical, surgical, and social history today  Medications: Outpatient Medications Prior to Visit  Medication Sig   amLODipine (NORVASC) 5 MG tablet Take 1 tablet by mouth once daily   clobetasol cream (TEMOVATE) 0.05 % Apply 1 Application topically 2 (two) times daily.   clopidogrel (PLAVIX) 75 MG tablet Take 1 tablet by mouth once daily   Dermatological Products, Misc. [facility] EX) Apply 1 Application topically 2 (two) times daily.   fish oil-omega-3 fatty acids 1000 MG capsule Take 1 g by mouth daily.   GLUCOSAMINE-CHONDROITIN PO Take 1 tablet by mouth daily.   levothyroxine (SYNTHROID) 88 MCG tablet TAKE 1 TABLET BY MOUTH BEFORE BREAKFAST   losartan (COZAAR) 50 MG tablet Take 1 tablet (50 mg total) by mouth daily.   pravastatin (PRAVACHOL) 40 MG tablet Take 1 tablet by mouth once daily   pregabalin (LYRICA) 75 MG capsule TAKE 1 CAPSULE BY MOUTH THREE TIMES DAILY   [DISCONTINUED] methylPREDNISolone (MEDROL DOSEPAK) 4 MG TBPK tablet Take as prescribed on the box   No facility-administered medications prior to visit.   Reviewed past medical and social history.   ROS per HPI above      Objective:  BP 110/60 (BP Location:  Left Arm, Patient Position: Sitting, Cuff Size: Normal)   Pulse (!) 57   Temp 98 F (36.7 C)   Ht 5\' 9"  (1.753 m)   Wt 161 lb (73 kg)   SpO2 97%   BMI 23.78 kg/m      Physical Exam Vitals and nursing note reviewed.  Cardiovascular:     Rate and Rhythm: Normal rate.     Pulses: Normal pulses.  Pulmonary:     Effort: Pulmonary effort is normal.  Skin:    Findings: Rash present. No erythema. Rash is scaling.          Comments: Scaly patches with white scales and lichenification Normal finger and toenails  Neurological:     Mental Status: He is oriented to person, place, and time.     No results found for any visits on 06/30/23.    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 Obtained skin biopsy to confirmed diagnosed. Advised to only use unscented moisturizing cream at this time. Procedure including risks/benefits explained to patient.   Questions were answered.  After informed consent was obtained and a time out completed. Site was cleansed with betadine and then alcohol. 1% Lidocaine with epinephrine was injected under  lesion and then punch biopsy was performed with a 5mm sklar instrument. Pressure dressing (Covered with topical antibiotics, non adherent pad and guaze) applied to obtain hemostasis. Pt tolerated procedure well.  Specimen sent for pathology review.  Pt instructed to keep the area dry for 24 hours, leave dressing on for 24hrs and to contact us if he develops redness, drainage or swelling at the site.  Pt may use tylenol as needed for discomfort today.    No follow-ups on file.     Alysia Penna, NP

## 2023-06-30 NOTE — Patient Instructions (Signed)
Skin Biopsy, Care After The following information offers guidance on how to care for yourself after your procedure. Your health care provider may also give you more specific instructions. If you have problems or questions, contact your health care provider. What can I expect after the procedure? After the procedure, it is common to have: Soreness or mild pain. Bruising. Itching. Some redness and swelling. Follow these instructions at home: Biopsy site care  Follow instructions from your health care provider about how to take care of your biopsy site. Make sure you: Wash your hands with soap and water for at least 20 seconds before and after you change your bandage (dressing). If soap and water are not available, use hand sanitizer. Change your dressing as told by your health care provider. Leave stitches (sutures), skin glue, or adhesive strips in place. These skin closures may need to stay in place for 2 weeks or longer. If adhesive strip edges start to loosen and curl up, you may trim the loose edges. Do not remove adhesive strips completely unless your health care provider tells you to do that. Check your biopsy site every day for signs of infection. Check for: More redness, swelling, or pain. Fluid or blood. Warmth. Pus or a bad smell. Do not take baths, swim, or use a hot tub until your health care provider approves. Ask your health care provider if you may take showers. You may only be allowed to take sponge baths. General instructions Take over-the-counter and prescription medicines only as told by your health care provider. Return to your normal activities as told by your health care provider. Ask your health care provider what activities are safe for you. Keep all follow-up visits. This is important. Contact a health care provider if: You have more redness, swelling, or pain around your biopsy site. You have fluid or blood coming from your biopsy site. Your biopsy site feels warm  to the touch. You have pus or a bad smell coming from your biopsy site. You have a fever. Your sutures, skin glue, or adhesive strips loosen or come off sooner than expected. Get help right away if: You have bleeding that does not stop with pressure or a dressing. Summary After the procedure, it is common to have soreness, bruising, and itching at the site. Follow instructions from your health care provider about how to take care of your biopsy site. Check your biopsy site every day for signs of infection. Contact a health care provider if you have more redness, swelling, or pain around your biopsy site, or your biopsy site feels warm to the touch. Keep all follow-up visits. This is important. This information is not intended to replace advice given to you by your health care provider. Make sure you discuss any questions you have with your health care provider. Document Revised: 05/22/2021 Document Reviewed: 05/22/2021 Elsevier Patient Education  2024 [employer].

## 2023-06-30 NOTE — Assessment & Plan Note (Signed)
Onset 3months ago, no itching, no joint pain or swelling. No new medication.

## 2023-07-01 ENCOUNTER — Encounter: Payer: Self-pay | Admitting: Nurse Practitioner

## 2023-07-01 DIAGNOSIS — R21 Rash and other nonspecific skin eruption: Secondary | ICD-10-CM | POA: Diagnosis not present

## 2023-07-01 DIAGNOSIS — B354 Tinea corporis: Secondary | ICD-10-CM | POA: Diagnosis not present

## 2023-07-02 ENCOUNTER — Telehealth: Payer: Self-pay

## 2023-07-02 NOTE — Telephone Encounter (Signed)
Called and talked with customer service.  They had to send a message to pathology due to lack of information.

## 2023-07-02 NOTE — Telephone Encounter (Signed)
Called rec'd from [facility] to verify location skin biopsy was taken from.  Biopsy done on leg, left or right orientation not needed per Candice. No further questions.

## 2023-07-04 ENCOUNTER — Telehealth: Payer: Self-pay | Admitting: Nurse Practitioner

## 2023-07-04 DIAGNOSIS — B354 Tinea corporis: Secondary | ICD-10-CM

## 2023-07-04 LAB — PATHOLOGY REPORT

## 2023-07-04 MED ORDER — KETOCONAZOLE 2 % EX CREA
1.0000 | TOPICAL_CREAM | Freq: Every day | CUTANEOUS | 1 refills | Status: DC
Start: 2023-07-04 — End: 2023-07-09

## 2023-07-04 NOTE — Telephone Encounter (Signed)
Notified about pathology report and need for cream. Avoid diflucan due to DRUG-DRUG INTERACTION with plavix

## 2023-07-09 ENCOUNTER — Other Ambulatory Visit: Payer: Self-pay

## 2023-07-09 DIAGNOSIS — B354 Tinea corporis: Secondary | ICD-10-CM

## 2023-07-09 MED ORDER — KETOCONAZOLE 2 % EX CREA
1.0000 | TOPICAL_CREAM | Freq: Every day | CUTANEOUS | 1 refills | Status: DC
Start: 2023-07-09 — End: 2024-06-26

## 2023-07-10 ENCOUNTER — Telehealth: Payer: Self-pay | Admitting: Nurse Practitioner

## 2023-07-10 NOTE — Telephone Encounter (Signed)
[facility] pharmacy need for you to call them in ref. To the nizoral 2% cream ( caroline # [phone]

## 2023-07-10 NOTE — Telephone Encounter (Signed)
Called and spoke with [place].  They needed to know how many milligrams he will be applying topically.  His insurance is requiring this information.  Per [place] [facility], 2 grams will be sufficient to apply for a 30 day supply.

## 2023-07-18 ENCOUNTER — Emergency Department (HOSPITAL_COMMUNITY): Payer: Medicare Other

## 2023-07-18 ENCOUNTER — Encounter (HOSPITAL_COMMUNITY): Payer: Self-pay

## 2023-07-18 ENCOUNTER — Emergency Department (HOSPITAL_COMMUNITY)
Admission: EM | Admit: 2023-07-18 | Discharge: 2023-07-18 | Disposition: A | Discharge status: Home / Self Care | Payer: Medicare Other | Attending: Emergency Medicine | Admitting: Emergency Medicine

## 2023-07-18 DIAGNOSIS — S0081XA Abrasion of other part of head, initial encounter: Secondary | ICD-10-CM | POA: Diagnosis not present

## 2023-07-18 DIAGNOSIS — M25512 Pain in left shoulder: Secondary | ICD-10-CM | POA: Diagnosis not present

## 2023-07-18 DIAGNOSIS — Y9301 Activity, walking, marching and hiking: Secondary | ICD-10-CM | POA: Diagnosis not present

## 2023-07-18 DIAGNOSIS — S0990XA Unspecified injury of head, initial encounter: Secondary | ICD-10-CM | POA: Diagnosis not present

## 2023-07-18 DIAGNOSIS — S0012XA Contusion of left eyelid and periocular area, initial encounter: Secondary | ICD-10-CM | POA: Insufficient documentation

## 2023-07-18 DIAGNOSIS — W0110XA Fall on same level from slipping, tripping and stumbling with subsequent striking against unspecified object, initial encounter: Secondary | ICD-10-CM | POA: Insufficient documentation

## 2023-07-18 DIAGNOSIS — S51012A Laceration without foreign body of left elbow, initial encounter: Secondary | ICD-10-CM | POA: Insufficient documentation

## 2023-07-18 DIAGNOSIS — S59902A Unspecified injury of left elbow, initial encounter: Secondary | ICD-10-CM | POA: Diagnosis not present

## 2023-07-18 DIAGNOSIS — S0003XA Contusion of scalp, initial encounter: Secondary | ICD-10-CM | POA: Diagnosis not present

## 2023-07-18 DIAGNOSIS — I6523 Occlusion and stenosis of bilateral carotid arteries: Secondary | ICD-10-CM | POA: Diagnosis not present

## 2023-07-18 DIAGNOSIS — W19XXXA Unspecified fall, initial encounter: Secondary | ICD-10-CM

## 2023-07-18 DIAGNOSIS — Z7902 Long term (current) use of antithrombotics/antiplatelets: Secondary | ICD-10-CM | POA: Diagnosis not present

## 2023-07-18 NOTE — Discharge Instructions (Signed)
Please return for sudden worsening headache confusion or vomiting.

## 2023-07-18 NOTE — ED Notes (Signed)
Trauma Response Nurse Documentation   [name] is a 89 y.o. male arriving to [facility] ED via POV  On Eliquis (apixaban) daily. Trauma was activated as a Level 2 by [place], Consulting civil engineer based on the following trauma criteria Elderly patients > 65 with head trauma on anti-coagulation (excluding ASA).  Patient cleared for CT by Dr. Adela Lank. Pt transported to CT with trauma response nurse present to monitor. RN remained with the patient throughout their absence from the department for clinical observation.   GCS 15.  History   Past Medical History:  Diagnosis Date   Coronary artery disease    PCI, STent to RCA   Diabetes mellitus without complication (HCC)    Hyperlipidemia    Hypothyroidism    Rash    Thyroid disease    hypothyroidism     Past Surgical History:  Procedure Laterality Date   APPENDECTOMY     CARDIAC CATHETERIZATION     2007 occluded ramus inter. , PCI to RCA   CARPAL TUNNEL RELEASE     DECOMPRESSIVE LUMBAR LAMINECTOMY LEVEL 2 N/A 12/20/2022   Procedure: LUMBAR THREE THROUGH FIVE LAMINECTOMIES AND FORAMINOTOMIES;  Surgeon: London Sheer, MD;  Location: [facility] OR;  Service: Orthopedics;  Laterality: N/A;   EYE SURGERY Bilateral    cataract surgery   LUMBAR LAMINECTOMY/DECOMPRESSION MICRODISCECTOMY N/A 12/22/2022   Procedure: POSTERIOR LUMBAR DURAL REPAIR;  Surgeon: London Sheer, MD;  Location: [facility] OR;  Service: Orthopedics;  Laterality: N/A;   TONSILLECTOMY         Initial Focused Assessment (If applicable, or please see trauma documentation): Airway-- intact, no visible obstruction Breathing-- spontaneous and unlabored Circulation-- abrasion to left forehead, skin tear to left elbow, bleeding controlled on arrival  CT's Completed:   CT Head, CT Maxillofacial, and CT C-Spine   Interventions:  See event summary  Plan for disposition:  Discharge home   Consults completed:  none at 2322.  Event Summary: Patient arrives POV. Patient with  mechanical fall this afternoon, face first onto concrete. Patient with bruising to left eye, abrasion to left forehead, skin tear to left elbow. Manual BP obtained. Patient to CT with TRN. CT head, c-spine, maxillofacial completed.  MTP Summary (If applicable):  N/A  Bedside handoff with ED RN Juliette Alcide.    Leota Sauers  Trauma Response RN  Please call TRN at [phone] for further assistance.

## 2023-07-18 NOTE — Progress Notes (Signed)
   07/18/23 2114  Spiritual Encounters  Type of Visit Initial  Care provided to: Pt and family  Referral source Trauma page  Reason for visit Trauma  OnCall Visit Yes  Spiritual Framework  Presenting Themes Impactful experiences and emotions;Meaning/purpose/sources of inspiration  Values/beliefs faith  Community/Connection Family  Patient Stress Factors Health changes  Interventions  Spiritual Care Interventions Made Reflective listening;Compassionate presence;Established relationship of care and support;Meaning making;Narrative/life review;Prayer  Intervention Outcomes  Outcomes Awareness of support;Connection to spiritual care  Advance Directives (For Healthcare)  Does Patient Have a Medical Advance Directive? No  Would patient like information on creating a medical advance directive? No - Patient declined  Mental Health Advance Directives  Does Patient Have a Mental Health Advance Directive? No  Would patient like information on creating a mental health advance directive? No - Patient declined   Ch responded to trauma level II page. Family was present at bedside. Ch provided hospitality and encouraged story telling. Pt belongs to the [employer]. His faith is very important to him. Ch offered a word of prayer. Pt expressed appreciation. Ch remains available.   Chaplain Val Raeli Wiens, M.Div.

## 2023-07-18 NOTE — ED Provider Notes (Signed)
[place] EMERGENCY DEPARTMENT AT [facility] Provider Note   CSN: [id number] Arrival date & time: 07/18/23  2057     History  Chief Complaint  Patient presents with   Fall   Head Injury    [name] is a 89 y.o. male.  89 yo M with a chief complaint of a fall.  The patient was walking across the streets and he tripped and struck the left side of his head on the ground.  He also struck the left shoulder and left elbow.  He denies any other specific injury.  Denies pain to the chest the back of the abdomen.   Fall  Head Injury      Home Medications Prior to Admission medications   Medication Sig Start Date End Date Taking? Authorizing Provider  amLODipine (NORVASC) 5 MG tablet Take 1 tablet by mouth once daily 12/06/22   [name], Deloris Ping, MD  clobetasol cream (TEMOVATE) 0.05 % Apply 1 Application topically 2 (two) times daily.    [provider]  clopidogrel (PLAVIX) 75 MG tablet Take 1 tablet by mouth once daily 01/14/23   [name], Deloris Ping, MD  Dermatological Products, Misc. [facility] EX) Apply 1 Application topically 2 (two) times daily.    [provider]  fish oil-omega-3 fatty acids 1000 MG capsule Take 1 g by mouth daily.    [provider]  GLUCOSAMINE-CHONDROITIN PO Take 1 tablet by mouth daily.    [provider]  ketoconazole (NIZORAL) 2 % cream Apply 1 Application topically daily. 07/09/23   Nche, Bonna Gains, NP  levothyroxine (SYNTHROID) 88 MCG tablet TAKE 1 TABLET BY MOUTH BEFORE BREAKFAST 06/25/23   Nche, Bonna Gains, NP  losartan (COZAAR) 50 MG tablet Take 1 tablet (50 mg total) by mouth daily. 11/28/22   [name], Deloris Ping, MD  pravastatin (PRAVACHOL) 40 MG tablet Take 1 tablet by mouth once daily 01/31/23   [name], Deloris Ping, MD      Allergies    Patient has no known allergies.    Review of Systems   Review of Systems  Physical Exam Updated Vital Signs BP (!) 150/41   Pulse (!) 52   Temp (!) 97.4 F  (36.3 C) (Oral)   Resp 19   Wt 74.2 kg   SpO2 100%   BMI 24.14 kg/m  Physical Exam Vitals and nursing note reviewed.  Constitutional:      Appearance: He is well-developed.  HENT:     Head: Normocephalic.     Comments: Abrasion to the left frontal region.  Abrasion over the left zygomatic area.  Hematoma about the left eyelid.  Extraocular motion intact.  No obvious facial nerve palsy. Eyes:     Pupils: Pupils are equal, round, and reactive to light.  Neck:     Vascular: No JVD.  Cardiovascular:     Rate and Rhythm: Normal rate and regular rhythm.     Heart sounds: No murmur heard.    No friction rub. No gallop.  Pulmonary:     Effort: No respiratory distress.     Breath sounds: No wheezing.  Abdominal:     General: There is no distension.     Tenderness: There is no abdominal tenderness. There is no guarding or rebound.  Musculoskeletal:        General: Normal range of motion.     Cervical back: Normal range of motion and neck supple.     Comments: Palpated from head to toe without  any other noted areas of bony tenderness.  He has a small skin tear over the left elbow.  Skin:    Coloration: Skin is not pale.     Findings: No rash.  Neurological:     Mental Status: He is alert and oriented to person, place, and time.  Psychiatric:        Behavior: Behavior normal.     ED Results / Procedures / Treatments   Labs (all labs ordered are listed, but only abnormal results are displayed) Labs Reviewed - No data to display  EKG None  Radiology DG Shoulder Left  Result Date: 07/18/2023 CLINICAL DATA:  Left shoulder pain for 1 year. Limited range of motion. EXAM: LEFT SHOULDER - 2+ VIEW COMPARISON:  None Available. FINDINGS: There is no evidence of fracture or dislocation. Mild glenohumeral and AC joint arthritis. Soft tissues are unremarkable. IMPRESSION: No acute fracture or dislocation. Electronically Signed   By: Minerva Fester M.D.   On: 07/18/2023 23:01   CT Head  Wo Contrast  Result Date: 07/18/2023 CLINICAL DATA:  Fall blood thinners EXAM: CT HEAD WITHOUT CONTRAST CT MAXILLOFACIAL WITHOUT CONTRAST CT CERVICAL SPINE WITHOUT CONTRAST TECHNIQUE: Multidetector CT imaging of the head, cervical spine, and maxillofacial structures were performed using the standard protocol without intravenous contrast. Multiplanar CT image reconstructions of the cervical spine and maxillofacial structures were also generated. RADIATION DOSE REDUCTION: This exam was performed according to the departmental dose-optimization program which includes automated exposure control, adjustment of the mA and/or kV according to patient size and/or use of iterative reconstruction technique. COMPARISON:  None Available. FINDINGS: CT HEAD FINDINGS Brain: There is no mass, hemorrhage or extra-axial collection. Normal appearance of the white matter with preserved gray-white differentiation. Normal CSF spaces. Vascular: There is atherosclerotic calcification of both internal carotid arteries at the skull base. Skull: Large left anterior scalp hematoma.  No calvarial fracture. Other: None CT MAXILLOFACIAL FINDINGS Osseous: No fracture or mandibular dislocation. No destructive process. Orbits: Negative. No traumatic or inflammatory finding. Sinuses: Clear. Soft tissues: Large left frontal scalp hematoma. CT CERVICAL SPINE FINDINGS Alignment: Normal Skull base and vertebrae: No acute fracture. No primary bone lesion or focal pathologic process. Soft tissues and spinal canal: No prevertebral fluid or swelling. No visible canal hematoma. Disc levels:  No spinal canal stenosis. Upper chest: Clear Other: None IMPRESSION: 1. No acute intracranial abnormality. 2. Large left frontal scalp hematoma without skull fracture. 3. No acute fracture or static subluxation of the cervical spine. Electronically Signed   By: Deatra Robinson M.D.   On: 07/18/2023 22:31   CT Cervical Spine Wo Contrast  Result Date: 07/18/2023 CLINICAL  DATA:  Fall blood thinners EXAM: CT HEAD WITHOUT CONTRAST CT MAXILLOFACIAL WITHOUT CONTRAST CT CERVICAL SPINE WITHOUT CONTRAST TECHNIQUE: Multidetector CT imaging of the head, cervical spine, and maxillofacial structures were performed using the standard protocol without intravenous contrast. Multiplanar CT image reconstructions of the cervical spine and maxillofacial structures were also generated. RADIATION DOSE REDUCTION: This exam was performed according to the departmental dose-optimization program which includes automated exposure control, adjustment of the mA and/or kV according to patient size and/or use of iterative reconstruction technique. COMPARISON:  None Available. FINDINGS: CT HEAD FINDINGS Brain: There is no mass, hemorrhage or extra-axial collection. Normal appearance of the white matter with preserved gray-white differentiation. Normal CSF spaces. Vascular: There is atherosclerotic calcification of both internal carotid arteries at the skull base. Skull: Large left anterior scalp hematoma.  No calvarial fracture. Other: None CT MAXILLOFACIAL  FINDINGS Osseous: No fracture or mandibular dislocation. No destructive process. Orbits: Negative. No traumatic or inflammatory finding. Sinuses: Clear. Soft tissues: Large left frontal scalp hematoma. CT CERVICAL SPINE FINDINGS Alignment: Normal Skull base and vertebrae: No acute fracture. No primary bone lesion or focal pathologic process. Soft tissues and spinal canal: No prevertebral fluid or swelling. No visible canal hematoma. Disc levels:  No spinal canal stenosis. Upper chest: Clear Other: None IMPRESSION: 1. No acute intracranial abnormality. 2. Large left frontal scalp hematoma without skull fracture. 3. No acute fracture or static subluxation of the cervical spine. Electronically Signed   By: Deatra Robinson M.D.   On: 07/18/2023 22:31   CT Maxillofacial Wo Contrast  Result Date: 07/18/2023 CLINICAL DATA:  Fall blood thinners EXAM: CT HEAD WITHOUT  CONTRAST CT MAXILLOFACIAL WITHOUT CONTRAST CT CERVICAL SPINE WITHOUT CONTRAST TECHNIQUE: Multidetector CT imaging of the head, cervical spine, and maxillofacial structures were performed using the standard protocol without intravenous contrast. Multiplanar CT image reconstructions of the cervical spine and maxillofacial structures were also generated. RADIATION DOSE REDUCTION: This exam was performed according to the departmental dose-optimization program which includes automated exposure control, adjustment of the mA and/or kV according to patient size and/or use of iterative reconstruction technique. COMPARISON:  None Available. FINDINGS: CT HEAD FINDINGS Brain: There is no mass, hemorrhage or extra-axial collection. Normal appearance of the white matter with preserved gray-white differentiation. Normal CSF spaces. Vascular: There is atherosclerotic calcification of both internal carotid arteries at the skull base. Skull: Large left anterior scalp hematoma.  No calvarial fracture. Other: None CT MAXILLOFACIAL FINDINGS Osseous: No fracture or mandibular dislocation. No destructive process. Orbits: Negative. No traumatic or inflammatory finding. Sinuses: Clear. Soft tissues: Large left frontal scalp hematoma. CT CERVICAL SPINE FINDINGS Alignment: Normal Skull base and vertebrae: No acute fracture. No primary bone lesion or focal pathologic process. Soft tissues and spinal canal: No prevertebral fluid or swelling. No visible canal hematoma. Disc levels:  No spinal canal stenosis. Upper chest: Clear Other: None IMPRESSION: 1. No acute intracranial abnormality. 2. Large left frontal scalp hematoma without skull fracture. 3. No acute fracture or static subluxation of the cervical spine. Electronically Signed   By: Deatra Robinson M.D.   On: 07/18/2023 22:31    Procedures Procedures    Medications Ordered in ED Medications - No data to display  ED Course/ Medical Decision Making/ A&P                                  Medical Decision Making Amount and/or Complexity of Data Reviewed Radiology: ordered.   89 yo M with a chief complaint of a fall.  Nonsyncopal by history.  Complaining mostly of left-sided facial pain.  Will obtain CT imaging.  CT of the head independently interpreted by me without obvious intracranial hemorrhage.  CT of the face and C-spine without obvious acute fracture.  Plain film of the left shoulder independently interpreted by me without obvious fracture or dislocation.  Will discharge the patient home.  PCP follow-up.  11:05 PM:  I have discussed the diagnosis/risks/treatment options with the patient and family.  Evaluation and diagnostic testing in the emergency department does not suggest an emergent condition requiring admission or immediate intervention beyond what has been performed at this time.  They will follow up with PCP. We also discussed returning to the ED immediately if new or worsening sx occur. We discussed the sx which are  most concerning (e.g., sudden worsening pain, fever, inability to tolerate by mouth) that necessitate immediate return. Medications administered to the patient during their visit and any new prescriptions provided to the patient are listed below.  Medications given during this visit Medications - No data to display   The patient appears reasonably screen and/or stabilized for discharge and I doubt any other medical condition or other [facility] requiring further screening, evaluation, or treatment in the ED at this time prior to discharge.          Final Clinical Impression(s) / ED Diagnoses Final diagnoses:  Fall, initial encounter  Closed head injury, initial encounter    Rx / DC Orders ED Discharge Orders     None         Melene Plan, DO 07/18/23 2305

## 2023-07-18 NOTE — ED Triage Notes (Signed)
Pt to ED via POV with c/o mechanical fall around 1530 today. Pt tripped on parking [place] and hit head. Denies LOC. Denies pain. Pt on Plavix. Abrasion to L shoulder, L eyebrow, L forehead, and L cheek. Skin tear to L elbow. Swelling to L eyelid. GCS 15.

## 2023-07-21 ENCOUNTER — Telehealth: Payer: Self-pay | Admitting: Nurse Practitioner

## 2023-07-21 NOTE — Transitions of Care (Post Inpatient/ED Visit) (Signed)
07/21/2023  Name: [name] MRN: [id number] DOB: 1930-05-22  Today's TOC FU Call Status: Today's TOC FU Call Status:: Successful TOC FU Call Completed TOC FU Call Complete Date: 07/21/23 Patient's Name and Date of Birth confirmed.  Transition Care Management Follow-up Telephone Call Date of Discharge: 07/18/23 Discharge Facility: [facility]) Type of Discharge: Emergency Department How have you been since you were released from the hospital?: Better Any questions or concerns?: No  Items Reviewed: Did you receive and understand the discharge instructions provided?: Yes Medications obtained,verified, and reconciled?: Yes (Medications Reviewed) Any new allergies since your discharge?: No Dietary orders reviewed?: NA Do you have support at home?: Yes  Medications Reviewed Today: Medications Reviewed Today   Medications were not reviewed in this encounter     Home Care and Equipment/Supplies: Were Home Health Services Ordered?: NA Any new equipment or medical supplies ordered?: NA  Functional Questionnaire: Do you need assistance with bathing/showering or dressing?: No Do you need assistance with meal preparation?: No Do you need assistance with eating?: No Do you have difficulty maintaining continence: No Do you need assistance with getting out of bed/getting out of a chair/moving?: No Do you have difficulty managing or taking your medications?: No  Follow up appointments reviewed: PCP Follow-up appointment confirmed?: Yes Date of PCP follow-up appointment?: 07/22/23 Follow-up Provider: [facility] Follow-up appointment confirmed?: NA Do you need transportation to your follow-up appointment?: No Do you understand care options if your condition(s) worsen?: Yes-patient verbalized understanding    SIGNATURE Arvil Persons, BSN, RN

## 2023-07-21 NOTE — Telephone Encounter (Signed)
Pt was seen in [facility] for a fall, I scheduled a hosp f/up appt for 07/22/23 with Claris Gower.

## 2023-07-22 ENCOUNTER — Encounter: Payer: Self-pay | Admitting: Nurse Practitioner

## 2023-07-22 ENCOUNTER — Ambulatory Visit (INDEPENDENT_AMBULATORY_CARE_PROVIDER_SITE_OTHER): Payer: Medicare Other | Admitting: Nurse Practitioner

## 2023-07-22 VITALS — BP 122/48 | HR 54 | Temp 97.7°F | Ht 69.0 in | Wt 160.0 lb

## 2023-07-22 DIAGNOSIS — S5002XD Contusion of left elbow, subsequent encounter: Secondary | ICD-10-CM

## 2023-07-22 DIAGNOSIS — S0083XD Contusion of other part of head, subsequent encounter: Secondary | ICD-10-CM | POA: Diagnosis not present

## 2023-07-22 DIAGNOSIS — W19XXXA Unspecified fall, initial encounter: Secondary | ICD-10-CM | POA: Insufficient documentation

## 2023-07-22 DIAGNOSIS — Z23 Encounter for immunization: Secondary | ICD-10-CM | POA: Diagnosis not present

## 2023-07-22 DIAGNOSIS — E1165 Type 2 diabetes mellitus with hyperglycemia: Secondary | ICD-10-CM

## 2023-07-22 DIAGNOSIS — W19XXXD Unspecified fall, subsequent encounter: Secondary | ICD-10-CM

## 2023-07-22 DIAGNOSIS — S40012D Contusion of left shoulder, subsequent encounter: Secondary | ICD-10-CM | POA: Diagnosis not present

## 2023-07-22 LAB — POCT GLYCOSYLATED HEMOGLOBIN (HGB A1C)
HbA1c POC (<> result, manual entry): 5.9 % (ref 4.0–5.6)
HbA1c, POC (controlled diabetic range): 5.9 % (ref 0.0–7.0)
HbA1c, POC (prediabetic range): 5.9 % (ref 5.7–6.4)
Hemoglobin A1C: 5.9 % — AB (ref 4.0–5.6)

## 2023-07-22 NOTE — Assessment & Plan Note (Signed)
Repeat hgbA1c: 5.9% Controlled with diet F/up in 3months

## 2023-07-22 NOTE — Assessment & Plan Note (Signed)
Fall on 07/18/2023 while walking. Reports he tripped on a curb. Impact on left shoulder, left elbow and left side of head. He denies any LOC or dizziness or headache or change in vision or focal weakness. Report chronic left shoulder pain with limited ROM secondary to OA.  Reviewed ED note and radiology reports

## 2023-07-22 NOTE — Patient Instructions (Addendum)
hgbA1c a 5.9%: controlled DIABETES  Understanding Your Risk for Falls Millions of people have serious injuries from falls each year. It is important to understand your risk of falling. Talk with your health care provider about your risk and what you can do to lower it. If you do have a serious fall, make sure to tell your provider. Falling once raises your risk of falling again. How can falls affect me? Serious injuries from falls are common. These include: Broken bones, such as hip fractures. Head injuries, such as traumatic brain injuries (TBI) or concussions. A fear of falling can cause you to avoid activities and stay at home. This can make your muscles weaker and raise your risk for a fall. What can increase my risk? There are a number of risk factors that increase your risk for falling. The more risk factors you have, the higher your risk of falling. Serious injuries from a fall happen most often to people who are older than 87 years old. Teenagers and young adults ages 22-29 are also at higher risk. Common risk factors include: Weakness in the lower body. Being generally weak or confused due to long-term (chronic) illness. Dizziness or balance problems. Poor vision. Medicines that cause dizziness or drowsiness. These may include: Medicines for your blood pressure, heart, anxiety, insomnia, or swelling (edema). Pain medicines. Muscle relaxants. Other risk factors include: Drinking alcohol. Having had a fall in the past. Having foot pain or wearing improper footwear. Working at a dangerous job. Having any of the following in your home: Tripping hazards, such as floor clutter or loose rugs. Poor lighting. Pets. Having dementia or memory loss. What actions can I take to lower my risk of falling?     Physical activity Stay physically fit. Do strength and balance exercises. Consider taking a regular class to build strength and balance. Yoga and tai chi are good  options. Vision Have your eyes checked every year and your prescription for glasses or contacts updated as needed. Shoes and walking aids Wear non-skid shoes. Wear shoes that have rubber soles and low heels. Do not wear high heels. Do not walk around the house in socks or slippers. Use a cane or walker as told by your provider. Home safety Attach secure railings on both sides of your stairs. Install grab bars for your bathtub, shower, and toilet. Use a non-skid mat in your bathtub or shower. Attach bath mats securely with double-sided, non-slip rug tape. Use good lighting in all rooms. Keep a flashlight near your bed. Make sure there is a clear path from your bed to the bathroom. Use night-lights. Do not use throw rugs. Make sure all carpeting is taped or tacked down securely. Remove all clutter from walkways and stairways, including extension cords. Repair uneven or broken steps and floors. Avoid walking on icy or slippery surfaces. Walk on the grass instead of on icy or slick sidewalks. Use ice melter to get rid of ice on walkways in the winter. Use a cordless phone. Questions to ask your health care provider Can you help me check my risk for a fall? Do any of my medicines make me more likely to fall? Should I take a vitamin D supplement? What exercises can I do to improve my strength and balance? Should I make an appointment to have my vision checked? Do I need a bone density test to check for weak bones (osteoporosis)? Would it help to use a cane or a walker? Where to find more information Centers for  Disease Control and Prevention, [employer]: TonerPromos.no Community-Based Fall Prevention Programs: TonerPromos.no [employer] on Aging: BaseRingTones.pl Contact a health care provider if: You fall at home. You are afraid of falling at home. You feel weak, drowsy, or dizzy. This information is not intended to replace advice given to you by your health care provider. Make sure you discuss any  questions you have with your health care provider. Document Revised: 06/24/2022 Document Reviewed: 06/24/2022 Elsevier Patient Education  2024 [employer].

## 2023-07-22 NOTE — Progress Notes (Signed)
Established Patient Visit  Patient: [name]   DOB: 1929-11-18   89 y.o. Male  MRN: [id number] Visit Date: 07/22/2023  Subjective:    Chief Complaint  Patient presents with   Hospitalization Follow-up    Fell on 07/18/23, left shoulder pain, flu vaccine   HPI DM (diabetes mellitus) (HCC) Repeat hgbA1c: 5.9% Controlled with diet F/up in 3months  Fall Fall on 07/18/2023 while walking. Reports he tripped on a curb. Impact on left shoulder, left elbow and left side of head. He denies any LOC or dizziness or headache or change in vision or focal weakness. Report chronic left shoulder pain with limited ROM secondary to OA.  Reviewed ED note and radiology reports  Reviewed medical, surgical, and social history today  Medications: Outpatient Medications Prior to Visit  Medication Sig   amLODipine (NORVASC) 5 MG tablet Take 1 tablet by mouth once daily   clobetasol cream (TEMOVATE) 0.05 % Apply 1 Application topically 2 (two) times daily.   clopidogrel (PLAVIX) 75 MG tablet Take 1 tablet by mouth once daily   Dermatological Products, Misc. [facility] EX) Apply 1 Application topically 2 (two) times daily.   fish oil-omega-3 fatty acids 1000 MG capsule Take 1 g by mouth daily.   GLUCOSAMINE-CHONDROITIN PO Take 1 tablet by mouth daily.   ketoconazole (NIZORAL) 2 % cream Apply 1 Application topically daily.   levothyroxine (SYNTHROID) 88 MCG tablet TAKE 1 TABLET BY MOUTH BEFORE BREAKFAST   losartan (COZAAR) 50 MG tablet Take 1 tablet (50 mg total) by mouth daily.   pravastatin (PRAVACHOL) 40 MG tablet Take 1 tablet by mouth once daily   No facility-administered medications prior to visit.   Reviewed past medical and social history.   ROS per HPI above      Objective:  BP (!) 122/48 (BP Location: Right Arm)   Pulse (!) 54   Temp 97.7 F (36.5 C)   Ht 5\' 9"  (1.753 m)   Wt 160 lb (72.6 kg)   SpO2 98%   BMI 23.63 kg/m      Physical Exam Vitals  reviewed.  HENT:     Head: Raccoon eyes and contusion present. No Battle's sign, abrasion, right periorbital erythema, left periorbital erythema or laceration.     Jaw: No trismus, pain on movement or malocclusion.     Nose: Signs of injury present. No nasal deformity.  Eyes:     Extraocular Movements: Extraocular movements intact.     Conjunctiva/sclera: Conjunctivae normal.     Pupils: Pupils are equal, round, and reactive to light.     Comments: Bilateral Periorbital bruising  Cardiovascular:     Rate and Rhythm: Normal rate.     Pulses: Normal pulses.     Heart sounds: Murmur heard.  Pulmonary:     Effort: Pulmonary effort is normal.     Breath sounds: Normal breath sounds.  Musculoskeletal:        General: Tenderness present. No swelling.     Left shoulder: Crepitus present. No laceration or tenderness. Decreased range of motion. Normal strength.     Right upper arm: Normal.     Left upper arm: Normal.     Left elbow: No effusion. Normal range of motion. No tenderness.     Right forearm: Normal.     Left forearm: Normal.     Cervical back: Normal range of motion and neck supple.  Right lower leg: No edema.     Left lower leg: No edema.     Comments: Abrasion and bruising on left elbow and left shoulder. Bilateral shoulder ROM is limited at 90 degrees  Skin:    Findings: Bruising present. No erythema.  Neurological:     Mental Status: He is alert and oriented to person, place, and time.     Results for orders placed or performed in visit on 07/22/23  POCT glycosylated hemoglobin (Hb A1C)  Result Value Ref Range   Hemoglobin A1C 5.9 (A) 4.0 - 5.6 %   HbA1c POC (<> result, manual entry) 5.9 4.0 - 5.6 %   HbA1c, POC (prediabetic range) 5.9 5.7 - 6.4 %   HbA1c, POC (controlled diabetic range) 5.9 0.0 - 7.0 %      Assessment & Plan:    Problem List Items Addressed This Visit     DM (diabetes mellitus) (HCC)    Repeat hgbA1c: 5.9% Controlled with diet F/up in  3months      Relevant Orders   POCT glycosylated hemoglobin (Hb A1C) (Completed)   Fall - Primary    Fall on 07/18/2023 while walking. Reports he tripped on a curb. Impact on left shoulder, left elbow and left side of head. He denies any LOC or dizziness or headache or change in vision or focal weakness. Report chronic left shoulder pain with limited ROM secondary to OA.  Reviewed ED note and radiology reports      Other Visit Diagnoses     Immunization due       Relevant Orders   Flu Vaccine Trivalent High Dose (Fluad) (Completed)   Contusion of other part of head, subsequent encounter       Contusion of left shoulder, subsequent encounter       Contusion of left elbow, subsequent encounter          Return in about 3 months (around 10/21/2023) for HTN, DM, Hypothyroidism, hyperlipidemia (fasting).     Alysia Penna, NP

## 2023-08-08 DIAGNOSIS — K080 Exfoliation of teeth due to systemic causes: Secondary | ICD-10-CM | POA: Diagnosis not present

## 2023-08-22 ENCOUNTER — Ambulatory Visit: Payer: Medicare Other | Admitting: Nurse Practitioner

## 2023-09-19 ENCOUNTER — Other Ambulatory Visit: Payer: Self-pay | Admitting: Nurse Practitioner

## 2023-09-19 DIAGNOSIS — E039 Hypothyroidism, unspecified: Secondary | ICD-10-CM

## 2023-10-20 ENCOUNTER — Ambulatory Visit (INDEPENDENT_AMBULATORY_CARE_PROVIDER_SITE_OTHER): Payer: Medicare Other | Admitting: Nurse Practitioner

## 2023-10-20 ENCOUNTER — Encounter: Payer: Self-pay | Admitting: Nurse Practitioner

## 2023-10-20 VITALS — BP 120/44 | HR 50 | Temp 98.1°F | Resp 18 | Ht 69.0 in | Wt 162.4 lb

## 2023-10-20 DIAGNOSIS — E785 Hyperlipidemia, unspecified: Secondary | ICD-10-CM

## 2023-10-20 DIAGNOSIS — N1831 Chronic kidney disease, stage 3a: Secondary | ICD-10-CM

## 2023-10-20 DIAGNOSIS — M19012 Primary osteoarthritis, left shoulder: Secondary | ICD-10-CM | POA: Insufficient documentation

## 2023-10-20 DIAGNOSIS — I10 Essential (primary) hypertension: Secondary | ICD-10-CM | POA: Diagnosis not present

## 2023-10-20 DIAGNOSIS — E1169 Type 2 diabetes mellitus with other specified complication: Secondary | ICD-10-CM

## 2023-10-20 DIAGNOSIS — E039 Hypothyroidism, unspecified: Secondary | ICD-10-CM | POA: Diagnosis not present

## 2023-10-20 LAB — HEMOGLOBIN A1C: Hgb A1c MFr Bld: 6.4 % (ref 4.6–6.5)

## 2023-10-20 LAB — LIPID PANEL
Cholesterol: 129 mg/dL (ref 0–200)
HDL: 29.8 mg/dL — ABNORMAL LOW (ref >39.00)
LDL Cholesterol: 75 mg/dL (ref 0–99)
NonHDL: 99.03
Total CHOL/HDL Ratio: 4
Triglycerides: 118 mg/dL (ref 0.0–149.0)
VLDL: 23.6 mg/dL (ref 0.0–40.0)

## 2023-10-20 LAB — CBC
HCT: 41.3 % (ref 39.0–52.0)
Hemoglobin: 14.2 g/dL (ref 13.0–17.0)
MCHC: 34.4 g/dL (ref 30.0–36.0)
MCV: 94.9 fL (ref 78.0–100.0)
Platelets: 248 10*3/uL (ref 150.0–400.0)
RBC: 4.35 Mil/uL (ref 4.22–5.81)
RDW: 13.6 % (ref 11.5–15.5)
WBC: 6.5 10*3/uL (ref 4.0–10.5)

## 2023-10-20 LAB — T4, FREE: Free T4: 0.85 ng/dL (ref 0.60–1.60)

## 2023-10-20 LAB — COMPREHENSIVE METABOLIC PANEL
ALT: 16 U/L (ref 0–53)
AST: 17 U/L (ref 0–37)
Albumin: 4.1 g/dL (ref 3.5–5.2)
Alkaline Phosphatase: 64 U/L (ref 39–117)
BUN: 23 mg/dL (ref 6–23)
CO2: 26 meq/L (ref 19–32)
Calcium: 9 mg/dL (ref 8.4–10.5)
Chloride: 107 meq/L (ref 96–112)
Creatinine, Ser: 1.35 mg/dL (ref 0.40–1.50)
GFR: 46.07 mL/min — ABNORMAL LOW (ref >60.00)
Glucose, Bld: 99 mg/dL (ref 70–99)
Potassium: 4.1 meq/L (ref 3.5–5.1)
Sodium: 140 meq/L (ref 135–145)
Total Bilirubin: 0.6 mg/dL (ref 0.2–1.2)
Total Protein: 6.4 g/dL (ref 6.0–8.3)

## 2023-10-20 LAB — TSH: TSH: 5.23 u[IU]/mL (ref 0.35–5.50)

## 2023-10-20 MED ORDER — LEVOTHYROXINE SODIUM 88 MCG PO TABS: 88.0000 ug | ORAL_TABLET | Freq: Every day | ORAL | 1 refills | Status: DC

## 2023-10-20 MED ORDER — DICLOFENAC SODIUM 1 % EX GEL
2.0000 g | Freq: Three times a day (TID) | CUTANEOUS | Status: DC | PRN
Start: 2023-10-20 — End: 2024-06-26

## 2023-10-20 NOTE — Assessment & Plan Note (Signed)
Repeat lipid panel Maintain pravastatin dose 

## 2023-10-20 NOTE — Assessment & Plan Note (Signed)
Repeat TSH and T4 Maintain levothyroxine dose 

## 2023-10-20 NOTE — Progress Notes (Signed)
Established Patient Visit  Patient: [name]   DOB: 1930/07/26   89 y.o. Male  MRN: [id number] Visit Date: 10/20/2023  Subjective:    Chief Complaint  Patient presents with   OFFICE VISIT     3 month follow up. PT is due for shingles vaccine    Hypertension   Diabetes   Hypothyroidism   Hyperlipidemia   Hypertension Persistently low DBP Denies any fatigue or dizziness. Current use of losartan 50mg , amlodipine 5mg   BP Readings from Last 3 Encounters:  10/20/23 (!) 120/44  07/22/23 (!) 122/48  07/18/23 (!) 138/55    Repeat CMP Will check need for med adjustment with cardiology  DM (diabetes mellitus) (HCC) Repeat hgbA1c and CMP Controlled with diet F/up in 6months  Hyperlipidemia associated with type 2 diabetes mellitus (HCC) Repeat lipid panel Maintain pravastatin dose  Hypothyroidism Repeat TSH and T4 Maintain levothyroxine dose  Glenohumeral arthritis, left Chronic, waxing and waning, worse with repetitive motion in his home workshop and cold weather. Also hurts extension and lifting heavy objects. Has limited ROM No joint effusion or redness or weakness DG shoulder joint completed 07/2023: There is no evidence of fracture or dislocation. Mild glenohumeral and AC joint arthritis. Soft tissues are unremarkable.  He declined ref to outpatient PT at this time. Provided home shoulder ROM exercises Advised to use tylenol or voltaren gel for pain  BP Readings from Last 3 Encounters:  10/20/23 (!) 120/44  07/22/23 (!) 122/48  07/18/23 (!) 138/55    Reviewed medical, surgical, and social history today  Medications: Outpatient Medications Prior to Visit  Medication Sig   amLODipine (NORVASC) 5 MG tablet Take 1 tablet by mouth once daily   clobetasol cream (TEMOVATE) 0.05 % Apply 1 Application topically 2 (two) times daily.   clopidogrel (PLAVIX) 75 MG tablet Take 1 tablet by mouth once daily   Dermatological Products, Misc. [facility]  EX) Apply 1 Application topically 2 (two) times daily.   fish oil-omega-3 fatty acids 1000 MG capsule Take 1 g by mouth daily.   GLUCOSAMINE-CHONDROITIN PO Take 1 tablet by mouth daily.   ketoconazole (NIZORAL) 2 % cream Apply 1 Application topically daily.   levothyroxine (SYNTHROID) 88 MCG tablet TAKE 1 TABLET BY MOUTH BEFORE BREAKFAST   losartan (COZAAR) 50 MG tablet Take 1 tablet (50 mg total) by mouth daily.   pravastatin (PRAVACHOL) 40 MG tablet Take 1 tablet by mouth once daily   No facility-administered medications prior to visit.   Reviewed past medical and social history.   ROS per HPI above      Objective:  BP (!) 120/44 (BP Location: Left Arm, Cuff Size: Small)   Pulse (!) 50   Temp 98.1 F (36.7 C) (Temporal)   Resp 18   Ht 5\' 9"  (1.753 m)   Wt 162 lb 6.4 oz (73.7 kg)   SpO2 99%   BMI 23.98 kg/m      Physical Exam Cardiovascular:     Rate and Rhythm: Normal rate and regular rhythm.     Pulses: Normal pulses.          Carotid pulses are  on the right side with bruit and  on the left side with bruit.    Heart sounds: Murmur heard.  Pulmonary:     Effort: Pulmonary effort is normal.     Breath sounds: Normal breath sounds.  Musculoskeletal:     Right lower  leg: No edema.     Left lower leg: No edema.  Neurological:     Mental Status: He is alert and oriented to person, place, and time.     No results found for any visits on 10/20/23.    Assessment & Plan:    Problem List Items Addressed This Visit     CKD (chronic kidney disease) stage 3, GFR 30-59 ml/min (HCC)   Relevant Orders   Comprehensive metabolic panel   DM (diabetes mellitus) (HCC) - Primary   Repeat hgbA1c and CMP Controlled with diet F/up in 6months      Relevant Orders   Hemoglobin A1c   Comprehensive metabolic panel   Glenohumeral arthritis, left   Chronic, waxing and waning, worse with repetitive motion in his home workshop and cold weather. Also hurts extension and lifting  heavy objects. Has limited ROM No joint effusion or redness or weakness DG shoulder joint completed 07/2023: There is no evidence of fracture or dislocation. Mild glenohumeral and AC joint arthritis. Soft tissues are unremarkable.  He declined ref to outpatient PT at this time. Provided home shoulder ROM exercises Advised to use tylenol or voltaren gel for pain      Relevant Medications   diclofenac Sodium (VOLTAREN ARTHRITIS PAIN) 1 % GEL   Hyperlipidemia associated with type 2 diabetes mellitus (HCC)   Repeat lipid panel Maintain pravastatin dose      Relevant Orders   Lipid panel   Comprehensive metabolic panel   Hypertension   Persistently low DBP Denies any fatigue or dizziness. Current use of losartan 50mg , amlodipine 5mg   BP Readings from Last 3 Encounters:  10/20/23 (!) 120/44  07/22/23 (!) 122/48  07/18/23 (!) 138/55    Repeat CMP Will check need for med adjustment with cardiology      Relevant Orders   Comprehensive metabolic panel   CBC   Hypothyroidism   Repeat TSH and T4 Maintain levothyroxine dose      Relevant Orders   T4, free   TSH   Return in about 6 months (around 04/19/2024) for HTN, DM, hyperlipidemia (fasting).     Alysia Penna, NP

## 2023-10-20 NOTE — Assessment & Plan Note (Signed)
Persistently low DBP Denies any fatigue or dizziness. Current use of losartan 50mg , amlodipine 5mg   BP Readings from Last 3 Encounters:  10/20/23 (!) 120/44  07/22/23 (!) 122/48  07/18/23 (!) 138/55    Repeat CMP Will check need for med adjustment with cardiology

## 2023-10-20 NOTE — Patient Instructions (Signed)
Go to lab Continue Heart healthy diet and daily exercise. Maintain current medications.  May use voltaren gel/ diclofenac sodium gel on left shoulder as needed for pain Start shoulder range of motion  Shoulder Exercises Ask your health care provider which exercises are safe for you. Do exercises exactly as told by your health care provider and adjust them as directed. It is normal to feel mild stretching, pulling, tightness, or discomfort as you do these exercises. Stop right away if you feel sudden pain or your pain gets worse. Do not begin these exercises until told by your health care provider. Stretching exercises External rotation and abduction This exercise is sometimes called corner stretch. The exercise rotates your arm outward (external rotation) and moves your arm out from your body (abduction). Stand in a doorway with one of your feet slightly in front of the other. This is called a staggered stance. If you cannot reach your forearms to the door frame, stand facing a corner of a room. Choose one of the following positions as told by your health care provider: Place your hands and forearms on the door frame above your head. Place your hands and forearms on the door frame at the height of your head. Place your hands on the door frame at the height of your elbows. Slowly move your weight onto your front foot until you feel a stretch across your chest and in the front of your shoulders. Keep your head and chest upright and keep your abdominal muscles tight. Hold for __________ seconds. To release the stretch, shift your weight to your back foot. Repeat __________ times. Complete this exercise __________ times a day. Extension, standing  Stand and hold a broomstick, a cane, or a similar object behind your back. Your hands should be a little wider than shoulder-width apart. Your palms should face away from your back. Keeping your elbows straight and your shoulder muscles relaxed, move  the stick away from your body until you feel a stretch in your shoulders (extension). Avoid shrugging your shoulders while you move the stick. Keep your shoulder blades tucked down toward the middle of your back. Hold for __________ seconds. Slowly return to the starting position. Repeat __________ times. Complete this exercise __________ times a day. Range-of-motion exercises Pendulum  Stand near a wall or a surface that you can hold onto for balance. Bend at the waist and let your left / right arm hang straight down. Use your other arm to support you. Keep your back straight and do not lock your knees. Relax your left / right arm and shoulder muscles, and move your hips and your trunk so your left / right arm swings freely. Your arm should swing because of the motion of your body, not because you are using your arm or shoulder muscles. Keep moving your hips and trunk so your arm swings in the following directions, as told by your health care provider: Side to side. Forward and backward. In clockwise and counterclockwise circles. Continue each motion for __________ seconds, or for as long as told by your health care provider. Slowly return to the starting position. Repeat __________ times. Complete this exercise __________ times a day. Shoulder flexion, standing  Stand and hold a broomstick, a cane, or a similar object. Place your hands a little more than shoulder-width apart on the object. Your left / right hand should be palm-up, and your other hand should be palm-down. Keep your elbow straight and your shoulder muscles relaxed. Push the stick up  with your healthy arm to raise your left / right arm in front of your body, and then over your head until you feel a stretch in your shoulder (flexion). Avoid shrugging your shoulder while you raise your arm. Keep your shoulder blade tucked down toward the middle of your back. Hold for __________ seconds. Slowly return to the starting  position. Repeat __________ times. Complete this exercise __________ times a day. Shoulder abduction, standing  Stand and hold a broomstick, a cane, or a similar object. Place your hands a little more than shoulder-width apart on the object. Your left / right hand should be palm-up, and your other hand should be palm-down. Keep your elbow straight and your shoulder muscles relaxed. Push the object across your body toward your left / right side. Raise your left / right arm to the side of your body (abduction) until you feel a stretch in your shoulder. Do not raise your arm above shoulder height unless your health care provider tells you to do that. If directed, raise your arm over your head. Avoid shrugging your shoulder while you raise your arm. Keep your shoulder blade tucked down toward the middle of your back. Hold for __________ seconds. Slowly return to the starting position. Repeat __________ times. Complete this exercise __________ times a day. Internal rotation  Place your left / right hand behind your back, palm-up. Use your other hand to dangle an exercise band, a broomstick, or a similar object over your shoulder. Grasp the band with your left / right hand so you are holding on to both ends. Gently pull up on the band until you feel a stretch in the front of your left / right shoulder. The movement of your arm toward the center of your body is called internal rotation. Avoid shrugging your shoulder while you raise your arm. Keep your shoulder blade tucked down toward the middle of your back. Hold for __________ seconds. Release the stretch by letting go of the band and lowering your hands. Repeat __________ times. Complete this exercise __________ times a day. Strengthening exercises External rotation  Sit in a stable chair without armrests. Secure an exercise band to a stable object at elbow height on your left / right side. Place a soft object, such as a folded towel or a small  pillow, between your left / right upper arm and your body to move your elbow about 4 inches (10 cm) away from your side. Hold the end of the exercise band so it is tight and there is no slack. Keeping your elbow pressed against the soft object, slowly move your forearm out, away from your abdomen (external rotation). Keep your body steady so only your forearm moves. Hold for __________ seconds. Slowly return to the starting position. Repeat __________ times. Complete this exercise __________ times a day. Shoulder abduction  Sit in a stable chair without armrests, or stand up. Hold a __________ lb / kg weight in your left / right hand, or hold an exercise band with both hands. Start with your arms straight down and your left / right palm facing in, toward your body. Slowly lift your left / right hand out to your side (abduction). Do not lift your hand above shoulder height unless your health care provider tells you that this is safe. Keep your arms straight. Avoid shrugging your shoulder while you do this movement. Keep your shoulder blade tucked down toward the middle of your back. Hold for __________ seconds. Slowly lower your arm, and  return to the starting position. Repeat __________ times. Complete this exercise __________ times a day. Shoulder extension  Sit in a stable chair without armrests, or stand up. Secure an exercise band to a stable object in front of you so it is at shoulder height. Hold one end of the exercise band in each hand. Straighten your elbows and lift your hands up to shoulder height. Squeeze your shoulder blades together as you pull your hands down to the sides of your thighs (extension). Stop when your hands are straight down by your sides. Do not let your hands go behind your body. Hold for __________ seconds. Slowly return to the starting position. Repeat __________ times. Complete this exercise __________ times a day. Shoulder row  Sit in a stable chair  without armrests, or stand up. Secure an exercise band to a stable object in front of you so it is at chest height. Hold one end of the exercise band in each hand. Position your palms so that your thumbs are facing the ceiling (neutral position). Bend each of your elbows to a 90-degree angle (right angle) and keep your upper arms at your sides. Step back or move the chair back until the band is tight and there is no slack. Slowly pull your elbows back behind you. Hold for __________ seconds. Slowly return to the starting position. Repeat __________ times. Complete this exercise __________ times a day. Shoulder press-ups  Sit in a stable chair that has armrests. Sit upright, with your feet flat on the floor. Put your hands on the armrests so your elbows are bent and your fingers are pointing forward. Your hands should be about even with the sides of your body. Push down on the armrests and use your arms to lift yourself off the chair. Straighten your elbows and lift yourself up as much as you comfortably can. Move your shoulder blades down, and avoid letting your shoulders move up toward your ears. Keep your feet on the ground. As you get stronger, your feet should support less of your body weight as you lift yourself up. Hold for __________ seconds. Slowly lower yourself back into the chair. Repeat __________ times. Complete this exercise __________ times a day. Wall push-ups  Stand so you are facing a stable wall. Your feet should be about one arm-length away from the wall. Lean forward and place your palms on the wall at shoulder height. Keep your feet flat on the floor as you bend your elbows and lean forward toward the wall. Hold for __________ seconds. Straighten your elbows to push yourself back to the starting position. Repeat __________ times. Complete this exercise __________ times a day. This information is not intended to replace advice given to you by your health care provider.  Make sure you discuss any questions you have with your health care provider. Document Revised: 12/11/2021 Document Reviewed: 12/11/2021 Elsevier Patient Education  2024 [employer].

## 2023-10-20 NOTE — Assessment & Plan Note (Signed)
Chronic, waxing and waning, worse with repetitive motion in his home workshop and cold weather. Also hurts extension and lifting heavy objects. Has limited ROM No joint effusion or redness or weakness DG shoulder joint completed 07/2023: There is no evidence of fracture or dislocation. Mild glenohumeral and AC joint arthritis. Soft tissues are unremarkable.  He declined ref to outpatient PT at this time. Provided home shoulder ROM exercises Advised to use tylenol or voltaren gel for pain

## 2023-10-20 NOTE — Assessment & Plan Note (Signed)
Repeat hgbA1c and CMP Controlled with diet F/up in 6months

## 2023-11-23 ENCOUNTER — Other Ambulatory Visit: Payer: Self-pay | Admitting: Cardiovascular Disease

## 2023-11-24 ENCOUNTER — Ambulatory Visit (HOSPITAL_COMMUNITY)
Admission: RE | Admit: 2023-11-24 | Discharge: 2023-11-24 | Disposition: A | Discharge status: Home / Self Care | Payer: Medicare Other | Source: Ambulatory Visit | Attending: Cardiovascular Disease | Admitting: Cardiovascular Disease

## 2023-11-24 DIAGNOSIS — I6523 Occlusion and stenosis of bilateral carotid arteries: Secondary | ICD-10-CM

## 2023-11-25 ENCOUNTER — Encounter: Payer: Self-pay | Admitting: Cardiovascular Disease

## 2023-11-26 ENCOUNTER — Telehealth: Payer: Self-pay

## 2023-11-26 DIAGNOSIS — I6523 Occlusion and stenosis of bilateral carotid arteries: Secondary | ICD-10-CM

## 2023-11-26 NOTE — Telephone Encounter (Signed)
Patient has viewed MD's comments via MyChart. Attempted to call, but no answer. Repeat carotid US placed at this time for next year.

## 2023-11-26 NOTE — Telephone Encounter (Signed)
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  This is not something that the vascular surgeons will operate on and will just be followed. Suggest repeat study in 1 year.

## 2023-12-01 ENCOUNTER — Ambulatory Visit (INDEPENDENT_AMBULATORY_CARE_PROVIDER_SITE_OTHER): Payer: Medicare Other

## 2023-12-01 VITALS — BP 118/50 | HR 66 | Temp 98.2°F | Ht 68.0 in | Wt 163.2 lb

## 2023-12-01 DIAGNOSIS — Z0000 Encounter for general adult medical examination without abnormal findings: Secondary | ICD-10-CM | POA: Diagnosis not present

## 2023-12-01 NOTE — Progress Notes (Signed)
Subjective:   [name] is a 89 y.o. male who presents for Medicare Annual/Subsequent preventive examination.  Visit Complete: In person    Cardiac Risk Factors include: advanced age (>47men, >31 women);diabetes mellitus;dyslipidemia;hypertension;male gender     Objective:    Today's Vitals   12/01/23 1454 12/01/23 1455  BP: (!) 118/50   Pulse: 66   Temp: 98.2 F (36.8 C)   TempSrc: Oral   SpO2: 98%   Weight: 163 lb 3.2 oz (74 kg)   Height: 5\' 8"  (1.727 m)   PainSc:  3    Body mass index is 24.81 kg/m.     12/01/2023    3:15 PM 07/18/2023    9:14 PM 12/20/2022    9:11 AM 12/12/2022    9:22 AM 11/29/2022    3:40 PM 08/08/2022   10:27 AM 11/27/2021    1:34 PM  Advanced Directives  Does Patient Have a Medical Advance Directive? Yes No Yes Yes Yes Yes Yes  Type of Advance Directive Living will  Healthcare Power of [employer] of [place];Living will Healthcare Power of [place];Living will Living will Healthcare Power of [place];Living will  Does patient want to make changes to medical advance directive?    No - Patient declined  No - Patient declined   Copy of Healthcare Power of Attorney in Chart?   No - copy requested No - copy requested No - copy requested  No - copy requested  Would patient like information on creating a medical advance directive?  No - Patient declined         Current Medications (verified) Outpatient Encounter Medications as of 12/01/2023  Medication Sig   amLODipine (NORVASC) 5 MG tablet Take 1 tablet by mouth once daily   clobetasol cream (TEMOVATE) 0.05 % Apply 1 Application topically 2 (two) times daily.   clopidogrel (PLAVIX) 75 MG tablet Take 1 tablet by mouth once daily   Dermatological Products, Misc. [facility] EX) Apply 1 Application topically 2 (two) times daily.   diclofenac Sodium (VOLTAREN ARTHRITIS PAIN) 1 % GEL Apply 2 g topically 3 (three) times daily as needed.   fish oil-omega-3 fatty acids 1000 MG capsule  Take 1 g by mouth daily.   GLUCOSAMINE-CHONDROITIN PO Take 1 tablet by mouth daily.   ketoconazole (NIZORAL) 2 % cream Apply 1 Application topically daily.   levothyroxine (SYNTHROID) 88 MCG tablet Take 1 tablet (88 mcg total) by mouth daily before breakfast.   losartan (COZAAR) 50 MG tablet Take 1 tablet (50 mg total) by mouth daily.   pravastatin (PRAVACHOL) 40 MG tablet Take 1 tablet by mouth once daily   No facility-administered encounter medications on file as of 12/01/2023.    Allergies (verified) Patient has no known allergies.   History: Past Medical History:  Diagnosis Date   Coronary artery disease    PCI, STent to RCA   Diabetes mellitus without complication (HCC)    Hyperlipidemia    Hypothyroidism    Rash    Thyroid disease    hypothyroidism   Past Surgical History:  Procedure Laterality Date   APPENDECTOMY     CARDIAC CATHETERIZATION     2007 occluded ramus inter. , PCI to RCA   CARPAL TUNNEL RELEASE     DECOMPRESSIVE LUMBAR LAMINECTOMY LEVEL 2 N/A 12/20/2022   Procedure: LUMBAR THREE THROUGH FIVE LAMINECTOMIES AND FORAMINOTOMIES;  Surgeon: London Sheer, MD;  Location: [facility] OR;  Service: Orthopedics;  Laterality: N/A;   EYE SURGERY Bilateral  cataract surgery   LUMBAR LAMINECTOMY/DECOMPRESSION MICRODISCECTOMY N/A 12/22/2022   Procedure: POSTERIOR LUMBAR DURAL REPAIR;  Surgeon: London Sheer, MD;  Location: [facility] OR;  Service: Orthopedics;  Laterality: N/A;   TONSILLECTOMY     Family History  Problem Relation Age of Onset   Hypertension Mother    Hypertension Father    Allergic rhinitis Neg Hx    Angioedema Neg Hx    Asthma Neg Hx    Eczema Neg Hx    Immunodeficiency Neg Hx    Urticaria Neg Hx    Social History   Socioeconomic History   Marital status: Married    Spouse name: Not on file   Number of children: Not on file   Years of education: Not on file   Highest education level: Not on file  Occupational History   Occupation: reired  Tobacco  Use   Smoking status: Former    Current packs/day: 0.00    Types: Cigarettes    Quit date: 11/04/1960    Years since quitting: 63.1   Smokeless tobacco: Never  Vaping Use   Vaping status: Never Used  Substance and Sexual Activity   Alcohol use: No   Drug use: No   Sexual activity: Not on file  Other Topics Concern   Not on file  Social History Narrative   Not on file   Social Drivers of Health   Financial Resource Strain: Low Risk  (12/01/2023)   Overall Financial Resource Strain (CARDIA)    Difficulty of Paying Living Expenses: Not hard at all  Food Insecurity: No Food Insecurity (12/01/2023)   Hunger Vital Sign    Worried About Running Out of Food in the Last Year: Never true    Ran Out of Food in the Last Year: Never true  Transportation Needs: No Transportation Needs (12/01/2023)   PRAPARE - Administrator, Civil Service (Medical): No    Lack of Transportation (Non-Medical): No  Physical Activity: Sufficiently Active (12/01/2023)   Exercise Vital Sign    Days of Exercise per Week: 3 days    Minutes of Exercise per Session: 120 min  Stress: No Stress Concern Present (12/01/2023)   [employer] of Occupational Health - Occupational Stress Questionnaire    Feeling of Stress : Not at all  Social Connections: Moderately Isolated (12/01/2023)   Social Connection and Isolation Panel [NHANES]    Frequency of Communication with Friends and Family: More than three times a week    Frequency of Social Gatherings with Friends and Family: Twice a week    Attends Religious Services: More than 4 times per year    Active Member of [employer] or Organizations: No    Attends Banker Meetings: Never    Marital Status: Widowed    Tobacco Counseling Counseling given: Not Answered   Clinical Intake:  Pre-visit preparation completed: Yes  Pain : 0-10 Pain Score: 3  Pain Type: Chronic pain Pain Location: Shoulder Pain Orientation: Left Pain Descriptors /  Indicators: Aching, Dull Pain Onset: More than a month ago Pain Frequency: Constant     Nutritional Risks: Nausea/ vomitting/ diarrhea (diarrhea for a day) Diabetes: Yes CBG done?: No Did pt. bring in CBG monitor from home?: No  How often do you need to have someone help you when you read instructions, pamphlets, or other written materials from your doctor or pharmacy?: 1 - Never  Interpreter Needed?: No  Information entered by :: NAllen LPN   Activities of Daily Living  12/01/2023    2:58 PM 12/20/2022    9:17 AM  In your present state of health, do you have any difficulty performing the following activities:  Hearing? 0 0  Vision? 1 0  Comment right eye foggy   Difficulty concentrating or making decisions? 1 0  Comment short term memory issues at times   Walking or climbing stairs? 0 1  Dressing or bathing? 0 0  Doing errands, shopping? 0   Preparing Food and eating ? N   Using the Toilet? N   In the past six months, have you accidently leaked urine? Y   Comment occasionally   Do you have problems with loss of bowel control? N   Comment on occasion   Managing your Medications? N   Managing your Finances? N   Housekeeping or managing your Housekeeping? N     Patient Care Team: Nche, Bonna Gains, NP as PCP - General (Internal Medicine) [name], Deloris Ping, MD as PCP - Cardiology (Cardiology)  Indicate any recent Medical Services you may have received from other than [facility] providers in the past year (date may be approximate).     Assessment:   This is a routine wellness examination for Antwon.  Hearing/Vision screen Hearing Screening - Comments:: Denies hearing issues Vision Screening - Comments::  Regular eye exams, [facility]   Goals Addressed             This Visit's Progress    Patient Stated       12/01/2023, no goals       Depression Screen    12/01/2023    3:24 PM 10/20/2023    8:31 AM 07/22/2023    1:04 PM 02/20/2023    8:07 AM 11/29/2022     3:42 PM 11/27/2021    1:36 PM 11/27/2021    1:32 PM  PHQ 2/9 Scores  PHQ - 2 Score 0 0 0 0 0 0 0  PHQ- 9 Score   0        Fall Risk    12/01/2023    3:19 PM 10/20/2023    8:31 AM 07/22/2023    1:04 PM 02/20/2023    8:07 AM 11/29/2022    3:41 PM  Fall Risk   Falls in the past year? 1 0 1 1 1   Comment stepped off curb      Number falls in past yr: 0 0 1 0 1  Injury with Fall? 1 0 1 0 0  Comment two black eyes      Risk for fall due to : Medication side effect No Fall Risks History of fall(s)  Medication side effect;Impaired mobility  Follow up Falls prevention discussed;Falls evaluation completed Falls evaluation completed Falls evaluation completed Falls evaluation completed Falls prevention discussed;Education provided;Falls evaluation completed    MEDICARE RISK AT HOME: Medicare Risk at Home Any stairs in or around the home?: Yes If so, are there any without handrails?: No Home free of loose throw rugs in walkways, pet beds, electrical cords, etc?: Yes Adequate lighting in your home to reduce risk of falls?: Yes Life alert?: No Use of a cane, walker or w/c?: No Grab bars in the bathroom?: Yes Shower chair or bench in shower?: Yes Elevated toilet seat or a handicapped toilet?: Yes  TIMED UP AND GO:  Was the test performed?  Yes  Length of time to ambulate 10 feet: 5 sec Gait slow and steady without use of assistive device    Cognitive Function:  12/01/2023    3:25 PM 11/29/2022    3:45 PM 08/01/2020    8:48 AM  6CIT Screen  What Year? 0 points 0 points 0 points  What month? 0 points 0 points 0 points  What time? 0 points 0 points 0 points  Count back from 20 0 points 0 points 0 points  Months in reverse 0 points 2 points 2 points  Repeat phrase 2 points 4 points 4 points  Total Score 2 points 6 points 6 points    Immunizations Immunization History  Administered Date(s) Administered   Fluad Quad(high Dose 65+) 08/01/2020, 08/27/2021, 07/22/2022   Fluad  Trivalent(High Dose 65+) 07/22/2023   Influenza-Unspecified 09/07/2018, 08/05/2019   PFIZER(Purple Top)SARS-COV-2 Vaccination 02/03/2020, 02/28/2020   Pneumococcal Conjugate-13 08/01/2020   Tdap 02/22/2021   Zoster Recombinant(Shingrix) 08/23/2020   Zoster, Unspecified 03/02/2015, 02/27/2017, 08/23/2020    TDAP status: Up to date  Flu Vaccine status: Up to date  Pneumococcal vaccine status: Due, Education has been provided regarding the importance of this vaccine. Advised may receive this vaccine at local pharmacy or Health Dept. Aware to provide a copy of the vaccination record if obtained from local pharmacy or Health Dept. Verbalized acceptance and understanding.  Covid-19 vaccine status: Information provided on how to obtain vaccines.   Qualifies for Shingles Vaccine? Yes   Zostavax completed Yes   Shingrix Completed?: No.    Education has been provided regarding the importance of this vaccine. Patient has been advised to call insurance company to determine out of pocket expense if they have not yet received this vaccine. Advised may also receive vaccine at local pharmacy or Health Dept. Verbalized acceptance and understanding.  Screening Tests Health Maintenance  Topic Date Due   COVID-19 Vaccine (3 - Pfizer risk series) 03/27/2020   Pneumonia Vaccine 38+ Years old (2 of 2 - PPSV23 or PCV20) 09/26/2020   Zoster Vaccines- Shingrix (2 of 2) 10/18/2020   FOOT EXAM  02/20/2024   OPHTHALMOLOGY EXAM  04/09/2024   HEMOGLOBIN A1C  04/19/2024   Medicare Annual Wellness (AWV)  11/30/2024   DTaP/Tdap/Td (2 - Td or Tdap) 02/23/2031   INFLUENZA VACCINE  Completed   HPV VACCINES  Aged Out    Health Maintenance  Health Maintenance Due  Topic Date Due   COVID-19 Vaccine (3 - Pfizer risk series) 03/27/2020   Pneumonia Vaccine 1+ Years old (2 of 2 - PPSV23 or PCV20) 09/26/2020   Zoster Vaccines- Shingrix (2 of 2) 10/18/2020    Colorectal cancer screening: No longer required.   Lung  Cancer Screening: (Low Dose CT Chest recommended if Age 48-80 years, 20 pack-year currently smoking OR have quit w/in 15years.) does not qualify.   Lung Cancer Screening Referral: no  Additional Screening:  Hepatitis C Screening: does not qualify;   Vision Screening: Recommended annual ophthalmology exams for early detection of glaucoma and other disorders of the eye. Is the patient up to date with their annual eye exam?  Yes  Who is the provider or what is the name of the office in which the patient attends annual eye exams? [facility] If pt is not established with a provider, would they like to be referred to a provider to establish care? No .   Dental Screening: Recommended annual dental exams for proper oral hygiene  Diabetic Foot Exam: Diabetic Foot Exam: Overdue, Pt has been advised about the importance in completing this exam. Pt is scheduled for diabetic foot exam on next appointment.  Community Resource Referral /  Chronic Care Management: CRR required this visit?  No   CCM required this visit?  No     Plan:     I have personally reviewed and noted the following in the patient's chart:   Medical and social history Use of alcohol, tobacco or illicit drugs  Current medications and supplements including opioid prescriptions. Patient is not currently taking opioid prescriptions. Functional ability and status Nutritional status Physical activity Advanced directives List of other physicians Hospitalizations, surgeries, and ER visits in previous 12 months Vitals Screenings to include cognitive, depression, and falls Referrals and appointments  In addition, I have reviewed and discussed with patient certain preventive protocols, quality metrics, and best practice recommendations. A written personalized care plan for preventive services as well as general preventive health recommendations were provided to patient.     Barb Merino, LPN   [id number]   After Visit  Summary: (In Person-Printed) AVS printed and given to the patient  Nurse Notes: none

## 2023-12-01 NOTE — Patient Instructions (Signed)
[name] , Thank you for taking time to come for your Medicare Wellness Visit. I appreciate your ongoing commitment to your health goals. Please review the following plan we discussed and let me know if I can assist you in the future.   Referrals/Orders/Follow-Ups/Clinician Recommendations: none  This is a list of the screening recommended for you and due dates:  Health Maintenance  Topic Date Due   COVID-19 Vaccine (3 - Pfizer risk series) 03/27/2020   Pneumonia Vaccine (2 of 2 - PPSV23 or PCV20) 09/26/2020   Zoster (Shingles) Vaccine (2 of 2) 10/18/2020   Complete foot exam   02/20/2024   Eye exam for diabetics  04/09/2024   Hemoglobin A1C  04/19/2024   Medicare Annual Wellness Visit  11/30/2024   DTaP/Tdap/Td vaccine (2 - Td or Tdap) 02/23/2031   Flu Shot  Completed   HPV Vaccine  Aged Out    Advanced directives: (Copy Requested) Please bring a copy of your health care power of attorney and living will to the office to be added to your chart at your convenience.  Next Medicare Annual Wellness Visit scheduled for next year: Yes  insert Preventive Care attachment Insert FALL PREVENTION attachment if needed

## 2023-12-17 ENCOUNTER — Telehealth: Payer: Self-pay

## 2023-12-17 NOTE — Telephone Encounter (Signed)
Copied from CRM [id number]. Topic: Clinical - Medication Question >> Dec 17, 2023  2:14 PM Deaijah H wrote: Reason for CRM: [facility] called in to receive permission to switch manufacture on prescription levothyroxine (SYNTHROID) 88 MCG tablet / please call [phone]

## 2023-12-17 NOTE — Telephone Encounter (Signed)
Please advise , see below. Ok to switch?

## 2023-12-18 NOTE — Telephone Encounter (Signed)
Spoke with [name] and advised her of PCP annotation below and she verbalized understanding.

## 2023-12-22 ENCOUNTER — Other Ambulatory Visit: Payer: Self-pay

## 2023-12-22 DIAGNOSIS — E039 Hypothyroidism, unspecified: Secondary | ICD-10-CM

## 2023-12-22 MED ORDER — LEVOTHYROXINE SODIUM 88 MCG PO TABS: 88.0000 ug | ORAL_TABLET | Freq: Every day | ORAL | 1 refills | Status: AC

## 2023-12-22 NOTE — Telephone Encounter (Signed)
Requesting: Synthroid Last Visit: 10/20/23 Next Visit: 12/06/2024 Last Refill: 10/20/2023  Please Advise

## 2024-01-02 ENCOUNTER — Other Ambulatory Visit: Payer: Self-pay | Admitting: Cardiovascular Disease

## 2024-03-04 ENCOUNTER — Encounter: Payer: Self-pay | Admitting: Cardiovascular Disease

## 2024-03-04 NOTE — Progress Notes (Signed)
 [name] Date of Birth  Apr 26, 1930  [address]    [address] [place], [place]  [address] [phone]  Problem List: 1. CAD - PCI of RCA March, 2012 2. RBBB 3. Hypothyroidism 4. Carotid bruits 5. hyperlipidemia   [name] is an elderly gentleman with a history of coronary artery disease. He status post PTCA and stenting of the mid-right coronary artery in April of this year. He's done well since I last saw him. He's not had any episodes of chest pain or shortness of breath. He's been tolerating his medications without any difficulty.  No angina. No dyspnea.  Exercising regularly - golf 1 day a week. Works out in [employer] 3 days a week ( including spin class)  Dec. 5, 2016:  Was last seen in 2013. Recently saw Bertell Broach. 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 Has had some stomach / lower chest pain.  Sharp pain . Has not carried any NTG in years.  Still working out regularly - without any angina .   Works out for 2 hours. Still playing golf several days a week.   Dec. 5, 2017:  Doing well. Played 27 holes of golf yesterday. Goes to the gym 3 days a week - spin class.   Jan. 16, 2019:  [name] is seen back today for follow-up of his coronary artery disease and carotid artery disease.  He had a carotid duplex scan in December, 2018 which reveals moderate bilateral carotid disease.  He will be scheduled to repeat this in 1 year.  Fasting labs recently reveal normal electrolytes and a stable creatinine at 1.29.  His liver enzymes are normal.  Total cholesterol is 126.  His HDL is stable at 30.  LDL is 67.  Triglycerides are 146.  Still exercising at [employer] 3 days a week.   Plays golf regularly  No CP or dyspnea.   Jan. 16 , 2020  Theory is seen today for a 1 year office visit Has a hx of CAD  With PCI of his RCA in March, 2012 Has moderate bilateral carotid artery disease  No syncope Has occasional episodes of upper abdominal /  lower chest pain He thinks its indigestion - is relieved with antiacids He exercises on a regular basis and these pains are not brought on by exercise. Lasts for a few seconds  Goes to [employer] regularly   Jan. 20, 2021:  [name] is seen today for follow up of his CAD.   S/p PCI of his RCA in March , 2012. Carotid duplex scan in Dec. 2020 showed moderate R carotid disease and mild L carotid stenosis.  No CP, no dyspnea.  No syncope or presyncope  Jan. 21, 2022:  [name] is seen for follow up of his CAD  Still working out at the [employer] .  No CP , no dyspnea   November 20, 2021: [name] is seen today for follow-up visit.  He has a history of coronary artery disease. His wife of 36 years passed away several months ago . No cp,  no dyspnea,  still working out at the gym   November 28, 2022: [name] is seen today for follow-up visit for his coronary artery disease, aortic stenosis .  He remains very active. No cp or dyspnea  BP is a bit elevated today .  He still eats salt  Still going to the gym  Having some back issues .  Needs to  have  L3-4, L4-5 LAMINECTOMY AND FORAMINOTOMIES    He has moderate aortic stenosis.   He has no CP or syncope with exercise .   he will be at low risk for his surgery .  He may hold his Plavix  for 5-7 days prior to back surgery. Restart when ok with the surgical team  Echocardiogram from January, 2023 reveals normal left ventricular systolic function.  He has moderate aortic stenosis.  His mean aortic valve gradient is 26 mmHg   Mar 11, 2023 [name] is seen for follow up of his aortic stenosis, CAD Had recent back surgery in Feb.   No cp , no dyspnea Still very active .  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     clopidogrel  (PLAVIX ) 75 MG tablet Take 1 tablet by mouth once daily 90 tablet 0   Dermatological Products, Misc. [facility] EX) Apply 1 Application topically 2 (two) times daily.     diclofenac  Sodium (VOLTAREN  ARTHRITIS PAIN) 1 % GEL Apply 2 g topically 3 (three) times daily as needed.     fish oil-omega-3 fatty acids 1000 MG capsule Take 1 g by mouth daily.     GLUCOSAMINE-CHONDROITIN PO Take 1 tablet by mouth daily.     ketoconazole  (NIZORAL ) 2 % cream Apply 1 Application topically daily. 60 g 1   levothyroxine  (SYNTHROID ) 88 MCG tablet Take 1 tablet (88 mcg total) by mouth daily before breakfast. 90 tablet 1   losartan  (COZAAR ) 50 MG tablet Take 1 tablet by mouth once daily 90 tablet 0   pravastatin  (PRAVACHOL ) 40 MG tablet Take 1 tablet by mouth once daily 90 tablet 0   No current facility-administered medications on file prior to visit.    No Known Allergies  Past Medical History:  Diagnosis Date   Coronary artery disease    PCI, STent to RCA   Diabetes mellitus without complication (HCC)    Hyperlipidemia    Hypothyroidism    Rash    Thyroid  disease    hypothyroidism    Past Surgical History:  Procedure Laterality Date   APPENDECTOMY     CARDIAC CATHETERIZATION     2007 occluded ramus inter. , PCI to RCA   CARPAL TUNNEL RELEASE     DECOMPRESSIVE LUMBAR LAMINECTOMY LEVEL 2 N/A 12/20/2022   Procedure: LUMBAR THREE THROUGH FIVE LAMINECTOMIES AND FORAMINOTOMIES;  Surgeon: Diedra Fowler, MD;  Location: [facility] OR;  Service: Orthopedics;  Laterality: N/A;   EYE SURGERY Bilateral    cataract surgery   LUMBAR  LAMINECTOMY/DECOMPRESSION MICRODISCECTOMY N/A 12/22/2022   Procedure: POSTERIOR LUMBAR DURAL REPAIR;  Surgeon: Diedra Fowler, MD;  Location: [facility] OR;  Service: Orthopedics;  Laterality: N/A;   TONSILLECTOMY      Social History   Tobacco Use  Smoking Status Former  Current packs/day: 0.00   Types: Cigarettes   Quit date: 11/04/1960   Years since quitting: 63.3  Smokeless Tobacco Never    Social History   Substance and Sexual Activity  Alcohol Use No    Family History  Problem Relation Age of Onset   Hypertension Mother    Hypertension Father    Allergic rhinitis Neg Hx    Angioedema Neg Hx    Asthma Neg Hx    Eczema Neg Hx    Immunodeficiency Neg Hx    Urticaria Neg Hx     Reviw of Systems:  Reviewed in the HPI.  All other systems are negative.   Physical Exam: Blood pressure (!) 138/52, pulse (!) 49, height 5\' 8"  (1.727 m), weight 159 lb (72.1 kg), SpO2 98%.       GEN:  Well nourished, well developed in no acute distress HEENT: Normal NECK: No JVD; bilat carotid bruits  LYMPHATICS: No lymphadenopathy CARDIAC: RRR 2/6 systolic, murmur  RESPIRATORY:  Clear to auscultation without rales, wheezing or rhonchi  ABDOMEN: Soft, non-tender, non-distended MUSCULOSKELETAL:  No edema; No deformity  SKIN: Warm and dry NEUROLOGIC:  Alert and oriented x 3     ECG:  EKG Interpretation Date/Time:  Friday Mar 05 2024 10:24:54 EDT Ventricular Rate:  47 PR Interval:  152 QRS Duration:  158 QT Interval:  474 QTC Calculation: 419 R Axis:   57  Text Interpretation: Sinus bradycardia Right bundle branch block When compared with ECG of 23-Dec-2022 16:07, Criteria for Anterior infarct are no longer Present Nonspecific T wave abnormality now evident in Inferior leads Confirmed by Ahmad Alert ([id number]) on 03/05/2024 10:49:18 AM     Assessment / Plan:   1. CAD -     he denies any angina.   2. RBBB -   stable.   3. Aortic stenosis:      [name] has moderate aortic stenosis  and moderate aortic insufficiency.  He is not having chest pain, shortness of breath, syncope or presyncope.  He does note that he is more short of breath with his everyday activities.  I would like to repeat his echocardiogram to continue our evaluation.  I think that he would be a candidate for TAVR.   4. Carotid bruits -    has moderate bilateral plaque  Has external carotid stenosis that is likely contributing to his bruit.    5. Hyperlipidemia-          6. HTN:   He still eats a fair amount of extra salt.  I encouraged him to reduce his salt intake.  Continue current medications.  Continue to monitor.  Ahmad Alert, MD  03/05/2024 1:57 PM    [facility] [address] [place],  [address] [place], [place]  [address] Pager [phone] Phone: [phone]; Fax: [phone]

## 2024-03-05 ENCOUNTER — Encounter: Payer: Self-pay | Admitting: Cardiovascular Disease

## 2024-03-05 ENCOUNTER — Ambulatory Visit: Payer: Medicare Other | Attending: Cardiology | Admitting: Cardiovascular Disease

## 2024-03-05 VITALS — BP 138/52 | HR 49 | Ht 68.0 in | Wt 159.0 lb

## 2024-03-05 DIAGNOSIS — I351 Nonrheumatic aortic (valve) insufficiency: Secondary | ICD-10-CM | POA: Insufficient documentation

## 2024-03-05 DIAGNOSIS — I10 Essential (primary) hypertension: Secondary | ICD-10-CM

## 2024-03-05 DIAGNOSIS — I350 Nonrheumatic aortic (valve) stenosis: Secondary | ICD-10-CM

## 2024-03-05 DIAGNOSIS — I2510 Atherosclerotic heart disease of native coronary artery without angina pectoris: Secondary | ICD-10-CM

## 2024-03-05 NOTE — Patient Instructions (Signed)
 Medication Instructions:  Your physician recommends that you continue on your current medications as directed. Please refer to the Current Medication list given to you today.  *If you need a refill on your cardiac medications before your next appointment, please call your pharmacy*  Lab Work: If you have labs (blood work) drawn today and your tests are completely normal, you will receive your results only by: MyChart Message (if you have MyChart) OR A paper copy in the mail If you have any lab test that is abnormal or we need to change your treatment, we will call you to review the results.  Testing/Procedures: Your physician has requested that you have an echocardiogram. Echocardiography is a painless test that uses sound waves to create images of your heart. It provides your doctor with information about the size and shape of your heart and how well your heart's chambers and valves are working. This procedure takes approximately one hour. There are no restrictions for this procedure. Please do NOT wear cologne, perfume, aftershave, or lotions (deodorant is allowed). Please arrive 15 minutes prior to your appointment time.  Please note: We ask at that you not bring children with you during ultrasound (echo/ vascular) testing. Due to room size and safety concerns, children are not allowed in the ultrasound rooms during exams. Our front office staff cannot provide observation of children in our lobby area while testing is being conducted. An adult accompanying a patient to their appointment will only be allowed in the ultrasound room at the discretion of the ultrasound technician under special circumstances. We apologize for any inconvenience. Follow-Up: At [facility], you and your health needs are our priority.  As part of our continuing mission to provide you with exceptional heart care, our providers are all part of one team.  This team includes your primary Cardiologist (physician)  and Advanced Practice Providers or APPs (Physician Assistants and Nurse Practitioners) who all work together to provide you with the care you need, when you need it.  Your next appointment:   1 year(s)  Provider:   General Cardiologist     We recommend signing up for the patient portal called "MyChart".  Sign up information is provided on this After Visit Summary.  MyChart is used to connect with patients for Virtual Visits (Telemedicine).  Patients are able to view lab/test results, encounter notes, upcoming appointments, etc.  Non-urgent messages can be sent to your provider as well.   To learn more about what you can do with MyChart, go to [email or web address].

## 2024-03-10 DIAGNOSIS — I10 Essential (primary) hypertension: Secondary | ICD-10-CM | POA: Diagnosis not present

## 2024-03-11 ENCOUNTER — Ambulatory Visit: Payer: Self-pay

## 2024-03-11 NOTE — Telephone Encounter (Signed)
 Chief Complaint: rectal bleeding Symptoms: rectal bleeding Frequency: x 3 weeks  Pertinent Negatives: Patient denies abd pain Disposition: [x] ED /[] Urgent Care (no appt availability in office) / [] Appointment(In office/virtual)/ []  [facility]/ [] Home Care/ [] Refused Recommended Disposition /[] [place] [place] [facility]/ []  Follow-up with PCP  Additional Notes: pt states that for the last 3 weeks he has been having some diarrhea and then noticed that one of the stools was black. States also noticed just a tiny amount of blood in his underwear. Pt states he did take one does of Pepto Bismol on yesterday but the black tarry stool was noticed before he took the pepto. Instructed pt to go to ED for further evaluation, he is hesitant on going and states that he wants to speak to his daughter firsts.  Copied from CRM [id number]. Topic: Clinical - Red Word Triage >> Mar 11, 2024 11:40 AM Allyne Areola wrote: Red Word that prompted transfer to Nurse Triage: Patient has been experiencing diarrhea and blood in the stool for about 3 weeks. Reason for Disposition  Black or tarry bowel movements  (Exception: Chronic-unchanged black-grey BMs AND is taking iron pills or Pepto-Bismol.)  Answer Assessment - Initial Assessment Questions 1. APPEARANCE of BLOOD: "What color is it?" "Is it passed separately, on the surface of the stool, or mixed in with the stool?"      In stool 2. AMOUNT: "How much blood was passed?"      unknown 3. FREQUENCY: "How many times has blood been passed with the stools?"      unknown 4. ONSET: "When was the blood first seen in the stools?" (Days or weeks)      unknown 5. DIARRHEA: "Is there also some diarrhea?" If Yes, ask: "How many diarrhea stools in the past 24 hours?"     4-5 6. CONSTIPATION: "Do you have constipation?" If Yes, ask: "How bad is it?"     no 7. RECURRENT SYMPTOMS: "Have you had blood in your stools before?" If Yes, ask: "When was the last time?" and "What happened  that time?"      no 8. BLOOD THINNERS: "Do you take any blood thinners?" (e.g., Coumadin/warfarin, Pradaxa/dabigatran, aspirin)     yes 9. OTHER SYMPTOMS: "Do you have any other symptoms?"  (e.g., abdomen pain, vomiting, dizziness, fever)     no  Protocols used: Rectal Bleeding-A-AH

## 2024-03-12 NOTE — Telephone Encounter (Signed)
 Noted. Patient advised to go to ED per Nurse Triage.

## 2024-04-05 ENCOUNTER — Other Ambulatory Visit: Payer: Self-pay

## 2024-04-05 MED ORDER — LOSARTAN POTASSIUM 50 MG PO TABS: 50.0000 mg | ORAL_TABLET | Freq: Every day | ORAL | 3 refills | Status: DC

## 2024-04-05 MED ORDER — CLOPIDOGREL BISULFATE 75 MG PO TABS: 75.0000 mg | ORAL_TABLET | Freq: Every day | ORAL | 3 refills | Status: DC

## 2024-04-12 ENCOUNTER — Ambulatory Visit (HOSPITAL_COMMUNITY)
Admission: RE | Admit: 2024-04-12 | Discharge: 2024-04-12 | Disposition: A | Discharge status: Home / Self Care | Source: Ambulatory Visit | Attending: Cardiovascular Disease | Admitting: Cardiovascular Disease

## 2024-04-12 ENCOUNTER — Other Ambulatory Visit (HOSPITAL_COMMUNITY)

## 2024-04-12 DIAGNOSIS — I10 Essential (primary) hypertension: Secondary | ICD-10-CM | POA: Diagnosis not present

## 2024-04-12 DIAGNOSIS — I351 Nonrheumatic aortic (valve) insufficiency: Secondary | ICD-10-CM

## 2024-04-12 DIAGNOSIS — I2510 Atherosclerotic heart disease of native coronary artery without angina pectoris: Secondary | ICD-10-CM | POA: Diagnosis not present

## 2024-04-12 DIAGNOSIS — I350 Nonrheumatic aortic (valve) stenosis: Secondary | ICD-10-CM | POA: Diagnosis not present

## 2024-04-12 DIAGNOSIS — H53451 Other localized visual field defect, right eye: Secondary | ICD-10-CM | POA: Diagnosis not present

## 2024-04-12 DIAGNOSIS — H524 Presbyopia: Secondary | ICD-10-CM | POA: Diagnosis not present

## 2024-04-12 LAB — ECHOCARDIOGRAM COMPLETE
AR max vel: 0.82 cm2
AV Area VTI: 0.78 cm2
AV Area mean vel: 0.8 cm2
AV Mean grad: 41 mmHg
AV Peak grad: 70.6 mmHg
Ao pk vel: 4.2 m/s
Area-P 1/2: 1.95 cm2
P 1/2 time: 395 ms
S' Lateral: 3.6 cm

## 2024-04-19 ENCOUNTER — Telehealth: Payer: Self-pay

## 2024-04-19 ENCOUNTER — Ambulatory Visit: Payer: Self-pay | Admitting: Cardiovascular Disease

## 2024-04-19 DIAGNOSIS — I350 Nonrheumatic aortic (valve) stenosis: Secondary | ICD-10-CM

## 2024-04-19 NOTE — Telephone Encounter (Signed)
 Per Dr. Alroy Aspen, called to arrange TAVR consult.  Left message to call back.

## 2024-04-19 NOTE — Telephone Encounter (Signed)
 Scheduled the patient for TAVR consult with Dr. Abel Hoe on 05/20/2024. He was grateful for call and agreed with plan.

## 2024-04-23 ENCOUNTER — Other Ambulatory Visit: Payer: Self-pay | Admitting: *Deleted

## 2024-04-23 MED ORDER — PRAVASTATIN SODIUM 40 MG PO TABS: 40.0000 mg | ORAL_TABLET | Freq: Every day | ORAL | 3 refills | Status: DC

## 2024-05-12 DIAGNOSIS — H18593 Other hereditary corneal dystrophies, bilateral: Secondary | ICD-10-CM | POA: Diagnosis not present

## 2024-05-12 DIAGNOSIS — H53143 Visual discomfort, bilateral: Secondary | ICD-10-CM | POA: Diagnosis not present

## 2024-05-19 ENCOUNTER — Other Ambulatory Visit: Payer: Self-pay

## 2024-05-19 MED ORDER — AMLODIPINE BESYLATE 5 MG PO TABS: 5.0000 mg | ORAL_TABLET | Freq: Every day | ORAL | 3 refills | Status: DC

## 2024-05-19 NOTE — Progress Notes (Unsigned)
 [facility] Consult Note  No chief complaint on file.  History of Present Illness: 89 yo male with history of CAD, DM, HLD, RBBB, hypothyroidism and aortic stenosis who is here today as a new consult, referred by Dr. Alveta, for further discussion regarding his aortic stenosis and possible TAVR. He has CAD and had stenting of his RCA remotely. He has been followed by Dr. Alveta for moderate aortic stenosis. Echo June 2025 with LVEF=55-60%. Moderate LVH. Mild MR. Severe aortic stenosis with mean gradient 41 mmHg, AVA 0.78 cm2, DI 0.23.   He tells me today that he *** He lives in *** He is retired Engineer, manufacturing ***  Primary Care Physician: Nche, [name], NP Primary Cardiologist: [name] Referring Cardiologist: [name]  Past Medical History:  Diagnosis Date   Coronary artery disease    PCI, STent to RCA   Diabetes mellitus without complication (HCC)    Hyperlipidemia    Hypothyroidism    Rash    Thyroid  disease    hypothyroidism    Past Surgical History:  Procedure Laterality Date   APPENDECTOMY     CARDIAC CATHETERIZATION     2007 occluded ramus inter. , PCI to RCA   CARPAL TUNNEL RELEASE     DECOMPRESSIVE LUMBAR LAMINECTOMY LEVEL 2 N/A 12/20/2022   Procedure: LUMBAR THREE THROUGH FIVE LAMINECTOMIES AND FORAMINOTOMIES;  Surgeon: Georgina Ozell LABOR, MD;  Location: [facility] OR;  Service: Orthopedics;  Laterality: N/A;   EYE SURGERY Bilateral    cataract surgery   LUMBAR LAMINECTOMY/DECOMPRESSION MICRODISCECTOMY N/A 12/22/2022   Procedure: POSTERIOR LUMBAR DURAL REPAIR;  Surgeon: Georgina Ozell LABOR, MD;  Location: [facility] OR;  Service: Orthopedics;  Laterality: N/A;   TONSILLECTOMY      Current Outpatient Medications  Medication Sig Dispense Refill   amLODipine  (NORVASC ) 5 MG tablet Take 1 tablet (5 mg total) by mouth daily. 90 tablet 3   clobetasol cream (TEMOVATE) 0.05 % Apply 1 Application topically 2 (two) times daily.     clopidogrel  (PLAVIX ) 75 MG tablet Take 1 tablet  (75 mg total) by mouth daily. 90 tablet 3   Dermatological Products, Misc. [facility] EX) Apply 1 Application topically 2 (two) times daily.     diclofenac  Sodium (VOLTAREN  ARTHRITIS PAIN) 1 % GEL Apply 2 g topically 3 (three) times daily as needed.     fish oil-omega-3 fatty acids 1000 MG capsule Take 1 g by mouth daily.     GLUCOSAMINE-CHONDROITIN PO Take 1 tablet by mouth daily.     ketoconazole  (NIZORAL ) 2 % cream Apply 1 Application topically daily. 60 g 1   levothyroxine  (SYNTHROID ) 88 MCG tablet Take 1 tablet (88 mcg total) by mouth daily before breakfast. 90 tablet 1   losartan  (COZAAR ) 50 MG tablet Take 1 tablet (50 mg total) by mouth daily. 90 tablet 3   pravastatin  (PRAVACHOL ) 40 MG tablet Take 1 tablet (40 mg total) by mouth daily. 90 tablet 3   No current facility-administered medications for this visit.    No Known Allergies  Social History   Socioeconomic History   Marital status: Married    Spouse name: Not on file   Number of children: Not on file   Years of education: Not on file   Highest education level: Not on file  Occupational History   Occupation: reired  Tobacco Use   Smoking status: Former    Current packs/day: 0.00    Types: Cigarettes    Quit date: 11/04/1960    Years since quitting:  63.5   Smokeless tobacco: Never  Vaping Use   Vaping status: Never Used  Substance and Sexual Activity   Alcohol use: No   Drug use: No   Sexual activity: Not on file  Other Topics Concern   Not on file  Social History Narrative   Not on file   Social Drivers of Health   Financial Resource Strain: Low Risk  (12/01/2023)   Overall Financial Resource Strain (CARDIA)    Difficulty of Paying Living Expenses: Not hard at all  Food Insecurity: No Food Insecurity (12/01/2023)   Hunger Vital Sign    Worried About Running Out of Food in the Last Year: Never true    Ran Out of Food in the Last Year: Never true  Transportation Needs: No Transportation Needs (12/01/2023)    PRAPARE - Administrator, Civil Service (Medical): No    Lack of Transportation (Non-Medical): No  Physical Activity: Sufficiently Active (12/01/2023)   Exercise Vital Sign    Days of Exercise per Week: 3 days    Minutes of Exercise per Session: 120 min  Stress: No Stress Concern Present (12/01/2023)   [employer] of Occupational Health - Occupational Stress Questionnaire    Feeling of Stress : Not at all  Social Connections: Moderately Isolated (12/01/2023)   Social Connection and Isolation Panel    Frequency of Communication with Friends and Family: More than three times a week    Frequency of Social Gatherings with Friends and Family: Twice a week    Attends Religious Services: More than 4 times per year    Active Member of [employer] or Organizations: No    Attends Banker Meetings: Never    Marital Status: Widowed  Intimate Partner Violence: Not At Risk (12/01/2023)   Humiliation, Afraid, Rape, and Kick questionnaire    Fear of Current or Ex-Partner: No    Emotionally Abused: No    Physically Abused: No    Sexually Abused: No    Family History  Problem Relation Age of Onset   Hypertension Mother    Hypertension Father    Allergic rhinitis Neg Hx    Angioedema Neg Hx    Asthma Neg Hx    Eczema Neg Hx    Immunodeficiency Neg Hx    Urticaria Neg Hx     Review of Systems:  As stated in the HPI and otherwise negative.   There were no vitals taken for this visit.  Physical Examination: General: Well developed, well nourished, NAD  HEENT: OP clear, mucus membranes moist  SKIN: warm, dry. No rashes. Neuro: No focal deficits  Musculoskeletal: Muscle strength 5/5 all ext  Psychiatric: Mood and affect normal  Neck: No JVD, no carotid bruits, no thyromegaly, no lymphadenopathy.  Lungs:Clear bilaterally, no wheezes, rhonci, crackles Cardiovascular: Regular rate and rhythm. *** Loud, harsh, late peaking systolic murmur.  Abdomen:Soft. Bowel sounds  present. Non-tender.  Extremities: *** No lower extremity edema. Pulses are 2 + in the bilateral DP/PT.  EKG:  EKG {ACTION; IS/IS WNU:[id number]} ordered today. The ekg ordered today demonstrates ***  Echo June 2025:  1. Left ventricular ejection fraction, by estimation, is 55 to 60%. The  left ventricle has normal function. The left ventricle has no regional  wall motion abnormalities. There is moderate left ventricular hypertrophy.  Left ventricular diastolic  parameters were normal. The average left ventricular global longitudinal  strain is -15.8 %. The global longitudinal strain is normal.   2. Right ventricular systolic function  is normal. The right ventricular  size is normal.   3. Left atrial size was moderately dilated.   4. The mitral valve is degenerative. Mild mitral valve regurgitation. No  evidence of mitral stenosis. Moderate mitral annular calcification.   5. The aortic valve is tricuspid. There is severe calcifcation of the  aortic valve. There is severe thickening of the aortic valve. Aortic valve  regurgitation is mild. Severe aortic valve stenosis.   6. The inferior vena cava is normal in size with greater than 50%  respiratory variability, suggesting right atrial pressure of 3 mmHg.   FINDINGS   Left Ventricle: Left ventricular ejection fraction, by estimation, is 55  to 60%. The left ventricle has normal function. The left ventricle has no  regional wall motion abnormalities. The average left ventricular global  longitudinal strain is -15.8 %.  Strain was performed and the global longitudinal strain is normal. The  left ventricular internal cavity size was normal in size. There is  moderate left ventricular hypertrophy. Left ventricular diastolic  parameters were normal.   Right Ventricle: The right ventricular size is normal. No increase in  right ventricular wall thickness. Right ventricular systolic function is  normal.   Left Atrium: Left atrial size was  moderately dilated.   Right Atrium: Right atrial size was normal in size.   Pericardium: There is no evidence of pericardial effusion.   Mitral Valve: The mitral valve is degenerative in appearance. There is  moderate thickening of the mitral valve leaflet(s). There is moderate  calcification of the mitral valve leaflet(s). Moderate mitral annular  calcification. Mild mitral valve  regurgitation. No evidence of mitral valve stenosis.   Tricuspid Valve: The tricuspid valve is normal in structure. Tricuspid  valve regurgitation is trivial. No evidence of tricuspid stenosis.   Aortic Valve: The aortic valve is tricuspid. There is severe calcifcation  of the aortic valve. There is severe thickening of the aortic valve.  Aortic valve regurgitation is mild. Aortic regurgitation PHT measures 395  msec. Severe aortic stenosis is  present. Aortic valve mean gradient measures 41.0 mmHg. Aortic valve peak  gradient measures 70.6 mmHg. Aortic valve area, by VTI measures 0.78 cm.   Pulmonic Valve: The pulmonic valve was normal in structure. Pulmonic valve  regurgitation is not visualized. No evidence of pulmonic stenosis.   Aorta: The aortic root is normal in size and structure.   Venous: The inferior vena cava is normal in size with greater than 50%  respiratory variability, suggesting right atrial pressure of 3 mmHg.   IAS/Shunts: No atrial level shunt detected by color flow Doppler.   Additional Comments: 3D was performed not requiring image post processing  on an independent workstation and was normal.     LEFT VENTRICLE  PLAX 2D  LVIDd:         5.10 cm   Diastology  LVIDs:         3.60 cm   LV e' medial:    6.96 cm/s  LV PW:         0.80 cm   LV E/e' medial:  18.2  LV IVS:        1.40 cm   LV e' lateral:   4.68 cm/s  LVOT diam:     2.10 cm   LV E/e' lateral: 27.1  LV SV:         87  LV SV Index:   47        2D Longitudinal Strain  LVOT  Area:     3.46 cm  2D Strain GLS (A4C):    -16.9 %                           2D Strain GLS (A3C):   -15.3 %                           2D Strain GLS (A2C):   -15.3 %                           2D Strain GLS Avg:     -15.8 %                             3D Volume EF:                           3D EF:        56 %                           LV EDV:       188 ml                           LV ESV:       82 ml                           LV SV:        106 ml   RIGHT VENTRICLE             IVC  RV S prime:     13.70 cm/s  IVC diam: 1.90 cm  TAPSE (M-mode): 1.8 cm   LEFT ATRIUM             Index        RIGHT ATRIUM           Index  LA diam:        4.40 cm 2.37 cm/m   RA Pressure: 3.00 mmHg  LA Vol (A2C):   52.4 ml 28.27 ml/m  RA Area:     13.70 cm  LA Vol (A4C):   79.2 ml 42.72 ml/m  RA Volume:   36.10 ml  19.47 ml/m  LA Biplane Vol: 71.5 ml 38.57 ml/m   AORTIC VALVE  AV Area (Vmax):    0.82 cm  AV Area (Vmean):   0.80 cm  AV Area (VTI):     0.78 cm  AV Vmax:           420.00 cm/s  AV Vmean:          301.000 cm/s  AV VTI:            1.110 m  AV Peak Grad:      70.6 mmHg  AV Mean Grad:      41.0 mmHg  LVOT Vmax:         99.20 cm/s  LVOT Vmean:        69.200 cm/s  LVOT VTI:          0.251 m  LVOT/AV VTI ratio: 0.23  AI PHT:            395 msec  AORTA  Ao Root diam: 3.70 cm  Ao Asc diam:  3.50 cm   MITRAL VALVE                TRICUSPID VALVE  MV Area (PHT): 1.95 cm     Estimated RAP:  3.00 mmHg  MV Decel Time: 390 msec  MV E velocity: 127.00 cm/s  SHUNTS  MV A velocity: 139.00 cm/s  Systemic VTI:  0.25 m  MV E/A ratio:  0.91         Systemic Diam: 2.10 cm   Recent Labs: 10/20/2023: ALT 16; BUN 23; Creatinine, Ser 1.35; Hemoglobin 14.2; Platelets 248.0; Potassium 4.1; Sodium 140; TSH 5.23    Wt Readings from Last 3 Encounters:  03/05/24 159 lb (72.1 kg)  12/01/23 163 lb 3.2 oz (74 kg)  10/20/23 162 lb 6.4 oz (73.7 kg)     Assessment and Plan:   1. Severe Aortic Valve Stenosis: He has severe, stage D ***  aortic  valve stenosis. He has NYHA class *** symptoms. I have personally reviewed the echo images. The aortic valve is thickened and calcified with limited leaflet mobility. I think he would benefit from AVR. Given advanced age, he is not a good candidate for conventional AVR by surgical approach. I think he may be a good candidate for TAVR.   I have reviewed the natural history of aortic stenosis with the patient and their family members  who are present today. We have discussed the limitations of medical therapy and the poor prognosis associated with symptomatic aortic stenosis. We have reviewed potential treatment options, including palliative medical therapy, conventional surgical aortic valve replacement, and transcatheter aortic valve replacement. We discussed treatment options in the context of the patient's specific comorbid medical conditions.   He would like to proceed with planning for TAVR. I will arrange a right and left heart catheterization at [facility] ***. Risks and benefits of the cath procedure and the valve procedure are reviewed with the patient. After the cath, he will have a cardiac CT, CTA of the chest/abdomen and pelvis and will then be referred to see one of the CT surgeons on our TAVR team.     Labs/ tests ordered today include:  No orders of the defined types were placed in this encounter.  Disposition:   F/U will be arranged with the structural team  Signed, Lonni Cash, MD, [facility] 05/19/2024 4:21 PM    [facility] [address] [place], [place], [place]  [address] Phone: [phone]; Fax: [phone]

## 2024-05-20 ENCOUNTER — Ambulatory Visit: Attending: Cardiovascular Disease | Admitting: Cardiovascular Disease

## 2024-05-20 ENCOUNTER — Encounter: Payer: Self-pay | Admitting: Cardiovascular Disease

## 2024-05-20 VITALS — BP 118/56 | HR 65 | Ht 68.0 in | Wt 158.2 lb

## 2024-05-20 DIAGNOSIS — I350 Nonrheumatic aortic (valve) stenosis: Secondary | ICD-10-CM

## 2024-05-20 DIAGNOSIS — I351 Nonrheumatic aortic (valve) insufficiency: Secondary | ICD-10-CM

## 2024-05-20 DIAGNOSIS — I6523 Occlusion and stenosis of bilateral carotid arteries: Secondary | ICD-10-CM | POA: Diagnosis not present

## 2024-05-20 NOTE — Patient Instructions (Signed)
 Medication Instructions:  No changes *If you need a refill on your cardiac medications before your next appointment, please call your pharmacy*  Lab Work: none   Testing/Procedures: ECHO DUE DEC 2025 Your physician has requested that you have an echocardiogram. Echocardiography is a painless test that uses sound waves to create images of your heart. It provides your doctor with information about the size and shape of your heart and how well your heart's chambers and valves are working. This procedure takes approximately one hour. There are no restrictions for this procedure. Please do NOT wear cologne, perfume, aftershave, or lotions (deodorant is allowed). Please arrive 15 minutes prior to your appointment time.  Please note: We ask at that you not bring children with you during ultrasound (echo/ vascular) testing. Due to room size and safety concerns, children are not allowed in the ultrasound rooms during exams. Our front office staff cannot provide observation of children in our lobby area while testing is being conducted. An adult accompanying a patient to their appointment will only be allowed in the ultrasound room at the discretion of the ultrasound technician under special circumstances. We apologize for any inconvenience.   Follow-Up: 6 months with Dr. Verlin.  We will call you to arrange this.

## 2024-05-27 ENCOUNTER — Telehealth: Payer: Self-pay

## 2024-05-27 NOTE — Progress Notes (Signed)
   05/27/2024  Patient ID: [name], male   DOB: 1930/09/24, 89 y.o.   MRN: [id number]  This patient is appearing on a report for being at risk of failing the adherence measure for hypertension (ACEi/ARB) medications this calendar year.   Medication: losartan  50mg  Last fill date: 04/05/2024 for 90 day supply  Insurance report was not up to date. No action needed at this time.   Channing DELENA Mealing, PharmD, DPLA

## 2024-06-25 ENCOUNTER — Other Ambulatory Visit: Payer: Self-pay

## 2024-06-25 ENCOUNTER — Observation Stay (HOSPITAL_COMMUNITY)
Admission: EM | Admit: 2024-06-25 | Discharge: 2024-06-26 | Disposition: A | Discharge status: Home / Self Care | Attending: Internal Medicine | Admitting: Internal Medicine

## 2024-06-25 ENCOUNTER — Emergency Department (HOSPITAL_COMMUNITY)

## 2024-06-25 ENCOUNTER — Encounter (HOSPITAL_COMMUNITY): Payer: Self-pay

## 2024-06-25 DIAGNOSIS — R5383 Other fatigue: Secondary | ICD-10-CM | POA: Insufficient documentation

## 2024-06-25 DIAGNOSIS — I10 Essential (primary) hypertension: Secondary | ICD-10-CM | POA: Insufficient documentation

## 2024-06-25 DIAGNOSIS — I700 Atherosclerosis of aorta: Secondary | ICD-10-CM | POA: Diagnosis not present

## 2024-06-25 DIAGNOSIS — R0789 Other chest pain: Secondary | ICD-10-CM | POA: Diagnosis not present

## 2024-06-25 DIAGNOSIS — I2510 Atherosclerotic heart disease of native coronary artery without angina pectoris: Secondary | ICD-10-CM | POA: Insufficient documentation

## 2024-06-25 DIAGNOSIS — E039 Hypothyroidism, unspecified: Secondary | ICD-10-CM | POA: Diagnosis not present

## 2024-06-25 DIAGNOSIS — N179 Acute kidney failure, unspecified: Secondary | ICD-10-CM | POA: Diagnosis not present

## 2024-06-25 DIAGNOSIS — I350 Nonrheumatic aortic (valve) stenosis: Secondary | ICD-10-CM | POA: Insufficient documentation

## 2024-06-25 DIAGNOSIS — E785 Hyperlipidemia, unspecified: Secondary | ICD-10-CM | POA: Insufficient documentation

## 2024-06-25 DIAGNOSIS — Z87891 Personal history of nicotine dependence: Secondary | ICD-10-CM | POA: Insufficient documentation

## 2024-06-25 DIAGNOSIS — R0602 Shortness of breath: Secondary | ICD-10-CM | POA: Diagnosis not present

## 2024-06-25 DIAGNOSIS — R079 Chest pain, unspecified: Secondary | ICD-10-CM | POA: Diagnosis present

## 2024-06-25 DIAGNOSIS — R7989 Other specified abnormal findings of blood chemistry: Secondary | ICD-10-CM | POA: Diagnosis not present

## 2024-06-25 DIAGNOSIS — J40 Bronchitis, not specified as acute or chronic: Secondary | ICD-10-CM | POA: Diagnosis not present

## 2024-06-25 LAB — BASIC METABOLIC PANEL WITH GFR
Anion gap: 8 (ref 5–15)
BUN: 22 mg/dL (ref 8–23)
CO2: 20 mmol/L — ABNORMAL LOW (ref 22–32)
Calcium: 9 mg/dL (ref 8.9–10.3)
Chloride: 113 mmol/L — ABNORMAL HIGH (ref 98–111)
Creatinine, Ser: 1.71 mg/dL — ABNORMAL HIGH (ref 0.61–1.24)
GFR, Estimated: 37 mL/min — ABNORMAL LOW (ref >60)
Glucose, Bld: 126 mg/dL — ABNORMAL HIGH (ref 70–99)
Potassium: 4.1 mmol/L (ref 3.5–5.1)
Sodium: 141 mmol/L (ref 135–145)

## 2024-06-25 LAB — CBC
HCT: 38.1 % — ABNORMAL LOW (ref 39.0–52.0)
Hemoglobin: 12.8 g/dL — ABNORMAL LOW (ref 13.0–17.0)
MCH: 32.2 pg (ref 26.0–34.0)
MCHC: 33.6 g/dL (ref 30.0–36.0)
MCV: 96 fL (ref 80.0–100.0)
Platelets: 206 K/uL (ref 150–400)
RBC: 3.97 MIL/uL — ABNORMAL LOW (ref 4.22–5.81)
RDW: 12.9 % (ref 11.5–15.5)
WBC: 8.5 K/uL (ref 4.0–10.5)
nRBC: 0 % (ref 0.0–0.2)

## 2024-06-25 LAB — TROPONIN I (HIGH SENSITIVITY): Troponin I (High Sensitivity): 38 ng/L — ABNORMAL HIGH (ref <18)

## 2024-06-25 MED ORDER — SODIUM CHLORIDE 0.9 % IV SOLN: Freq: Once | INTRAVENOUS | Status: AC

## 2024-06-25 NOTE — ED Notes (Signed)
 Patient transported to X-ray

## 2024-06-25 NOTE — ED Provider Notes (Signed)
 [facility] Provider Note   CSN: [id number] Arrival date & time: 06/25/24  2151     Patient presents with: Shortness of Breath and Chest Heaviness   [name]  is a 89 y.o. male.   89 year old male with past medical history of CAD, hyperlipidemia, hypothyroid, diabetes, aortic stenosis, MI.  Patient presents with complaint of feeling off.  He states that this has been progressively worsening for the past 3 weeks, intermittent.  Patient is very active at baseline.  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  He states that he has stayed active, today he cleaned the doors in his house, weed eat it and pulled weeds in addition to his regular house cleaning.  He mentioned to his daughter who is a nurse that he felt more worn out than he normally would which prompted family concern and trip to the ER today.  He states with his prior MIs, he did not have typical chest pain either.  He denies chest pain, shortness of breath, lower extremity edema or other complaints or concerns.       Prior to Admission medications   Medication Sig Start Date End Date Taking? Authorizing Provider  amLODipine  (NORVASC ) 5 MG tablet Take 1 tablet (5 mg total) by mouth daily. 05/19/24  Yes [name], [name], MD  clopidogrel  (PLAVIX ) 75 MG tablet Take 1 tablet (75 mg total) by mouth daily. 04/05/24  Yes [name], [name], MD  diclofenac  Sodium (VOLTAREN ) 1 % GEL Apply 2 g topically daily as needed (bilateral shoulder pain.).   Yes [provider]  fish oil-omega-3 fatty acids 1000 MG capsule Take 1 g by mouth daily.   Yes [provider]  GLUCOSAMINE-CHONDROITIN PO Take 1 tablet by mouth daily.   Yes [provider]  levothyroxine  (SYNTHROID ) 88 MCG tablet Take 1 tablet (88 mcg total) by mouth daily before breakfast. 12/22/23  Yes Nche, [name], NP  losartan  (COZAAR ) 50 MG tablet Take 1 tablet (50 mg total)  by mouth daily. 04/05/24  Yes [name], [name], MD  pravastatin  (PRAVACHOL ) 40 MG tablet Take 1 tablet (40 mg total) by mouth daily. 04/23/24  Yes [name], [name], MD    Allergies: Patient has no known allergies.    Review of Systems Negative except as per HPI Updated Vital Signs BP (!) 117/101 (BP Location: Left Arm)   Pulse 65   Temp 97.8 F (36.6 C) (Oral)   Resp 15   Ht 5' 10 (1.778 m)   Wt 73.4 kg   SpO2 98%   BMI 23.23 kg/m   Physical Exam Vitals and nursing note reviewed.  Constitutional:      General: He is not in acute distress.    Appearance: He is well-developed. He is not diaphoretic.  HENT:     Head: Normocephalic and atraumatic.  Cardiovascular:     Rate and Rhythm: Normal rate and regular rhythm.     Heart sounds: Murmur heard.  Pulmonary:     Effort: Pulmonary effort is normal.     Breath sounds: Normal breath sounds. No decreased breath sounds.  Chest:     Chest wall: No tenderness.  Abdominal:     Palpations: Abdomen is soft.     Tenderness: There is no abdominal tenderness.  Musculoskeletal:     Cervical back: Neck supple.     Right lower leg: No tenderness. No edema.     Left lower leg:  No tenderness. No edema.  Skin:    General: Skin is warm and dry.     Findings: No erythema or rash.  Neurological:     Mental Status: He is alert and oriented to person, place, and time.  Psychiatric:        Behavior: Behavior normal.     (all labs ordered are listed, but only abnormal results are displayed) Labs Reviewed  BASIC METABOLIC PANEL WITH GFR - Abnormal; Notable for the following components:      Result Value   Chloride 113 (*)    CO2 20 (*)    Glucose, Bld 126 (*)    Creatinine, Ser 1.71 (*)    GFR, Estimated 37 (*)    All other components within normal limits  CBC - Abnormal; Notable for the following components:   RBC 3.97 (*)    Hemoglobin 12.8 (*)    HCT 38.1 (*)    All other components within normal limits  TROPONIN I (HIGH  SENSITIVITY) - Abnormal; Notable for the following components:   Troponin I (High Sensitivity) 38 (*)    All other components within normal limits  TROPONIN I (HIGH SENSITIVITY) - Abnormal; Notable for the following components:   Troponin I (High Sensitivity) 51 (*)    All other components within normal limits  RESPIRATORY PANEL BY PCR  URINALYSIS, ROUTINE W REFLEX MICROSCOPIC    EKG: EKG Interpretation Date/Time:  Friday June 25 2024 21:57:51 EDT Ventricular Rate:  60 PR Interval:  176 QRS Duration:  165 QT Interval:  472 QTC Calculation: 472 R Axis:   80  Text Interpretation: Sinus rhythm Right bundle branch block when compared to prior, similar appearance No STEMI Confirmed by Ginger Barefoot ([id number]) on 06/25/2024 10:16:14 PM  Radiology: DG Chest 2 View Result Date: 06/25/2024 CLINICAL DATA:  Chest heaviness.  Shortness of breath. EXAM: CHEST - 2 VIEW COMPARISON:  01/19/2021 FINDINGS: Heart size and pulmonary vascularity are normal. Peribronchial thickening with perihilar opacities likely representing chronic bronchitis. No airspace disease or consolidation in the lungs. No pleural effusion or pneumothorax. Mediastinal contours appear intact. Old left rib fractures. Calcification of the aorta. IMPRESSION: Chronic bronchitic changes in the lungs.  No focal consolidation. Electronically Signed   By: Elsie Gravely M.D.   On: 06/25/2024 22:38     Procedures   Medications Ordered in the ED  amLODipine  (NORVASC ) tablet 5 mg (has no administration in time range)  losartan  (COZAAR ) tablet 50 mg (has no administration in time range)  pravastatin  (PRAVACHOL ) tablet 40 mg (has no administration in time range)  levothyroxine  (SYNTHROID ) tablet 88 mcg (has no administration in time range)  clopidogrel  (PLAVIX ) tablet 75 mg (has no administration in time range)  omega-3 acid ethyl esters (LOVAZA ) capsule 1 g (has no administration in time range)  acetaminophen  (TYLENOL ) tablet 650 mg  (has no administration in time range)  ondansetron  (ZOFRAN ) injection 4 mg (has no administration in time range)  heparin  injection 5,000 Units (has no administration in time range)  0.9 %  sodium chloride  infusion (has no administration in time range)  0.9 %  sodium chloride  infusion (0 mLs Intravenous Stopped 06/26/24 0234)                                    Medical Decision Making Amount and/or Complexity of Data Reviewed Labs: ordered. Radiology: ordered.  Risk Prescription drug management. Decision regarding hospitalization.  This patient presents to the ED for concern of fatigue/feelint off, this involves an extensive number of treatment options, and is a complaint that carries with it a high risk of complications and morbidity.  The differential diagnosis includes but not limited to metabolic/electrolyte, arrhythmia, ACS   Co morbidities / Chronic conditions that complicate the patient evaluation  Coronary artery disease, hypertension, hypothyroid, diabetes, chronic kidney disease, aortic stenosis   Additional history obtained:  Additional history obtained from EMR External records from outside source obtained and reviewed including prior labs and imaging on file.  Prior visit to cardiology for severe aortic stenosis   Lab Tests:  I Ordered, and personally interpreted labs.  The pertinent results include: CBC without significant findings.  Urinalysis without significant findings.  Initial troponin is elevated at 38, repeat is 51.  BMP with slightly low to creatinine at 1.71.   Imaging Studies ordered:  I ordered imaging studies including chest x-ray I independently visualized and interpreted imaging which showed no acute process I agree with the radiologist interpretation   Cardiac Monitoring: / EKG:  The patient was maintained on a cardiac monitor.  I personally viewed and interpreted the cardiac monitored which showed an underlying rhythm of: Sinus rhythm,  rate 60   Problem List / ED Course / Critical interventions / Medication management  89 year old male brought in by EMS with concern for fatigue, feeling like he has less stamina/off as above.  Murmur on exam, known aortic stenosis.  Initial troponin is elevated at 38, repeat uptrending to 51.  Discussed with cardiology who will see the patient, request hospitalist admission for his AKI. I have reviewed the patients home medicines and have made adjustments as needed   Consultations Obtained:  I requested consultation with the cardiology- Dr. Marty,  and discussed lab and imaging findings as well as pertinent plan - they recommend: will see the patient, request hospitalist admission    Social Determinants of Health:  Lives alone, has PCP   Test / Admission - Considered:  admit      Final diagnoses:  AKI (acute kidney injury) [facility])  Other fatigue  Nonrheumatic aortic valve stenosis    ED Discharge Orders     None          Beverley Leita DELENA DEVONNA 06/26/24 0345    Griselda Norris, MD 06/28/24 [phone]

## 2024-06-25 NOTE — ED Triage Notes (Signed)
 Pt arrived via [facility] from home c/o chest heaviness and SOB since 5pm today. Pt states SOB is worse on exertion. Denies chest pain. Given 324mg  of aspirin in route.

## 2024-06-26 DIAGNOSIS — R079 Chest pain, unspecified: Secondary | ICD-10-CM | POA: Diagnosis present

## 2024-06-26 DIAGNOSIS — N179 Acute kidney failure, unspecified: Secondary | ICD-10-CM

## 2024-06-26 DIAGNOSIS — I350 Nonrheumatic aortic (valve) stenosis: Secondary | ICD-10-CM

## 2024-06-26 DIAGNOSIS — R0789 Other chest pain: Secondary | ICD-10-CM | POA: Diagnosis not present

## 2024-06-26 LAB — BASIC METABOLIC PANEL WITH GFR
Anion gap: 9 (ref 5–15)
BUN: 21 mg/dL (ref 8–23)
CO2: 18 mmol/L — ABNORMAL LOW (ref 22–32)
Calcium: 8.8 mg/dL — ABNORMAL LOW (ref 8.9–10.3)
Chloride: 113 mmol/L — ABNORMAL HIGH (ref 98–111)
Creatinine, Ser: 1.47 mg/dL — ABNORMAL HIGH (ref 0.61–1.24)
GFR, Estimated: 45 mL/min — ABNORMAL LOW (ref >60)
Glucose, Bld: 165 mg/dL — ABNORMAL HIGH (ref 70–99)
Potassium: 3.9 mmol/L (ref 3.5–5.1)
Sodium: 140 mmol/L (ref 135–145)

## 2024-06-26 LAB — TROPONIN I (HIGH SENSITIVITY)
Troponin I (High Sensitivity): 37 ng/L — ABNORMAL HIGH (ref <18)
Troponin I (High Sensitivity): 51 ng/L — ABNORMAL HIGH (ref <18)

## 2024-06-26 LAB — URINALYSIS, ROUTINE W REFLEX MICROSCOPIC
Bacteria, UA: NONE SEEN
Bilirubin Urine: NEGATIVE
Glucose, UA: NEGATIVE mg/dL
Hgb urine dipstick: NEGATIVE
Ketones, ur: NEGATIVE mg/dL
Leukocytes,Ua: NEGATIVE
Nitrite: NEGATIVE
Protein, ur: NEGATIVE mg/dL
Specific Gravity, Urine: 1.018 (ref 1.005–1.030)
pH: 5 (ref 5.0–8.0)

## 2024-06-26 LAB — CBC
HCT: 40 % (ref 39.0–52.0)
Hemoglobin: 13.5 g/dL (ref 13.0–17.0)
MCH: 32 pg (ref 26.0–34.0)
MCHC: 33.8 g/dL (ref 30.0–36.0)
MCV: 94.8 fL (ref 80.0–100.0)
Platelets: 213 K/uL (ref 150–400)
RBC: 4.22 MIL/uL (ref 4.22–5.81)
RDW: 13.1 % (ref 11.5–15.5)
WBC: 8.7 K/uL (ref 4.0–10.5)
nRBC: 0 % (ref 0.0–0.2)

## 2024-06-26 LAB — MAGNESIUM: Magnesium: 1.8 mg/dL (ref 1.7–2.4)

## 2024-06-26 LAB — TSH: TSH: 2.483 u[IU]/mL (ref 0.350–4.500)

## 2024-06-26 LAB — T4, FREE: Free T4: 0.9 ng/dL (ref 0.61–1.12)

## 2024-06-26 LAB — CK: Total CK: 31 U/L — ABNORMAL LOW (ref 49–397)

## 2024-06-26 MED ORDER — LACTATED RINGERS IV BOLUS
500.0000 mL | Freq: Once | INTRAVENOUS | Status: AC
  Administered 2024-06-26: 500 mL via INTRAVENOUS

## 2024-06-26 MED ORDER — ACETAMINOPHEN 325 MG PO TABS
650.0000 mg | ORAL_TABLET | ORAL | Status: DC | PRN
Start: 2024-06-26 — End: 2024-06-26

## 2024-06-26 MED ORDER — HEPARIN SODIUM (PORCINE) 5000 UNIT/ML IJ SOLN
5000.0000 [IU] | Freq: Three times a day (TID) | INTRAMUSCULAR | Status: DC
  Administered 2024-06-26 (×2): 5000 [IU] via SUBCUTANEOUS
  Filled 2024-06-26 (×2): qty 1

## 2024-06-26 MED ORDER — CLOPIDOGREL BISULFATE 75 MG PO TABS
75.0000 mg | ORAL_TABLET | Freq: Every day | ORAL | Status: DC
  Administered 2024-06-26: 75 mg via ORAL
  Filled 2024-06-26: qty 1

## 2024-06-26 MED ORDER — SODIUM CHLORIDE 0.9 % IV SOLN
INTRAVENOUS | Status: DC
Start: 2024-06-26 — End: 2024-06-26

## 2024-06-26 MED ORDER — ONDANSETRON HCL 4 MG/2ML IJ SOLN: 4.0000 mg | Freq: Four times a day (QID) | INTRAMUSCULAR | Status: DC | PRN

## 2024-06-26 MED ORDER — PRAVASTATIN SODIUM 40 MG PO TABS
40.0000 mg | ORAL_TABLET | Freq: Every day | ORAL | Status: DC
  Administered 2024-06-26: 40 mg via ORAL
  Filled 2024-06-26: qty 1

## 2024-06-26 MED ORDER — LOSARTAN POTASSIUM 50 MG PO TABS: 50.0000 mg | ORAL_TABLET | Freq: Every day | ORAL | Status: DC

## 2024-06-26 MED ORDER — OMEGA-3-ACID ETHYL ESTERS 1 G PO CAPS
1.0000 g | ORAL_CAPSULE | Freq: Every day | ORAL | Status: DC
  Administered 2024-06-26: 1 g via ORAL
  Filled 2024-06-26: qty 1

## 2024-06-26 MED ORDER — AMLODIPINE BESYLATE 5 MG PO TABS: 5.0000 mg | ORAL_TABLET | Freq: Every day | ORAL | Status: DC

## 2024-06-26 MED ORDER — LEVOTHYROXINE SODIUM 88 MCG PO TABS
88.0000 ug | ORAL_TABLET | Freq: Every day | ORAL | Status: DC
  Administered 2024-06-26: 88 ug via ORAL
  Filled 2024-06-26: qty 1

## 2024-06-26 MED ORDER — SODIUM CHLORIDE 0.9 % IV SOLN: INTRAVENOUS | Status: DC

## 2024-06-26 NOTE — Care Management CC44 (Signed)
 Condition Code 44 Documentation Completed  Patient Details  Name: [name] MRN: [id number] Date of Birth: December 25, 1929   Condition Code 44 given:  Yes Patient signature on Condition Code 44 notice:  Yes Documentation of 2 MD's agreement:  Yes Code 44 added to claim:  Yes    Marval Gell, RN 06/26/2024, 4:57 PM

## 2024-06-26 NOTE — Care Management Obs Status (Signed)
 MEDICARE OBSERVATION STATUS NOTIFICATION   Patient Details  Name: [name] MRN: [id number] Date of Birth: Feb 08, 1930   Medicare Observation Status Notification Given:  Yes    Marval Gell, RN 06/26/2024, 4:57 PM

## 2024-06-26 NOTE — Progress Notes (Signed)
   Seen this morning by cardiology fellow. I have reviewed the note and evaluated the patient this morning. This is a 89 yo male with severe asymptomatic AS, evaluated for TAVR but was not symptomatic at the time, now coming in with possible dehydration, mild AKI and chest pain with minimally elevated trop of 38 -> 51, suspect for demand ischemia. Gently hydrated overnight - labs pending today. He feels much better and wants to go home. Plan was for 2D echo, but not ordered - he just had an echo in June, however, which showed a mean gradient of 41 mmHg. I'm not sure of the utilty of another study at this time.  Will likely need earlier outpatient f/u with Dr. Verlin to evaluate for TAVR.  On exam he is pleasant, sitting upright in no distress, lungs clear, RRR, 3/6 SEM, late peaking, no clear S2.  From a cardiac standpoint, if labs improved, may be able to be discharged home today.  We will arrange for earlier follow-up.  Vinie KYM Maxcy, MD, [facility], FNLA, FACP    [facility]  Medical Director of the Advanced Lipid Disorders &  Cardiovascular Risk Reduction Clinic Diplomate of the American Board of Clinical Lipidology Attending Cardiologist  Direct Dial: [phone]  Fax: [phone]  Website:  [email or web address]

## 2024-06-26 NOTE — Consult Note (Addendum)
 Cardiology Consultation   Patient ID: [name] MRN: [id number]; DOB: 1930-08-26  Admit date: 06/25/2024 Date of Consult: 06/26/2024  PCP:  [name], NP   [facility] Providers Cardiologist:  Lonni Cash, MD        Patient Profile: [name] is a 89 y.o. male with a hx of CAD, severe AS, RBBB, HTN, DM, hypothyroidism who is being seen 06/26/2024 for the evaluation of feeling unwell with slightly elevated troponin at the request of Dr Griselda (emergency department).   History of Present Illness: [name] has a history of coronary disease and had RCA PCI in 2012. He follos with Dr Alveta and more recently has seen Dr Cash for consideration of TAVR for severe AS (echo in June with preserved EF mean gradient 41 and DI 0.23). He reported no symptoms when seeing Dr Cash in July and so was classified as stage C AS with plans for potential TAVR should he develop symptoms.   Regarding the current presentation, Mr Ewing reports that he was asked to come to the ER by his granddaughter and daughter after telling them that he was feeling off. He has a hard time describing his exact symptoms but reports that for the last week he has been more fatigued than usual (he is quite active, continues to work and golf and drove up to [place] recently). Also for the last 3-4 days he has had some heaviness in his chest with exertion intermittently. He also reports nasal drainage which is abnormal for him. No other cold symptoms. Today he was working outside and was not staying as hydrated as he normally does when outside. He denies SOB. He denies CP currently. Vitals notable for hypothermia in ED, intermittent sinus bradycardia (similar to EKGs in past). Labs notable for AKI.    Past Medical History:  Diagnosis Date   Aortic stenosis    Coronary artery disease    PCI, STent to RCA   Diabetes mellitus without complication (HCC)    Hyperlipidemia    Hypothyroidism     Rash    Thyroid  disease    hypothyroidism    Past Surgical History:  Procedure Laterality Date   APPENDECTOMY     CARDIAC CATHETERIZATION     2007 occluded ramus inter. , PCI to RCA   CARPAL TUNNEL RELEASE     DECOMPRESSIVE LUMBAR LAMINECTOMY LEVEL 2 N/A 12/20/2022   Procedure: LUMBAR THREE THROUGH FIVE LAMINECTOMIES AND FORAMINOTOMIES;  Surgeon: Georgina Ozell LABOR, MD;  Location: [facility] OR;  Service: Orthopedics;  Laterality: N/A;   EYE SURGERY Bilateral    cataract surgery   LUMBAR LAMINECTOMY/DECOMPRESSION MICRODISCECTOMY N/A 12/22/2022   Procedure: POSTERIOR LUMBAR DURAL REPAIR;  Surgeon: Georgina Ozell LABOR, MD;  Location: [facility] OR;  Service: Orthopedics;  Laterality: N/A;   TONSILLECTOMY       Home Medications:  Prior to Admission medications   Medication Sig Start Date End Date Taking? Authorizing Provider  amLODipine  (NORVASC ) 5 MG tablet Take 1 tablet (5 mg total) by mouth daily. 05/19/24   [name], [name], MD  clobetasol cream (TEMOVATE) 0.05 % Apply 1 Application topically 2 (two) times daily.    [provider]  clopidogrel  (PLAVIX ) 75 MG tablet Take 1 tablet (75 mg total) by mouth daily. 04/05/24   [name], [name], MD  Dermatological Products, Misc. [facility] EX) Apply 1 Application topically 2 (two) times daily.    [provider]  diclofenac  Sodium (VOLTAREN  ARTHRITIS PAIN) 1 % GEL Apply 2  g topically 3 (three) times daily as needed. 10/20/23   Nche, [name], NP  fish oil-omega-3 fatty acids 1000 MG capsule Take 1 g by mouth daily.    [provider]  GLUCOSAMINE-CHONDROITIN PO Take 1 tablet by mouth daily.    [provider]  ketoconazole  (NIZORAL ) 2 % cream Apply 1 Application topically daily. 07/09/23   Nche, [name], NP  levothyroxine  (SYNTHROID ) 88 MCG tablet Take 1 tablet (88 mcg total) by mouth daily before breakfast. 12/22/23   Nche, [name], NP  losartan  (COZAAR ) 50 MG tablet Take 1 tablet (50 mg total) by mouth daily.  04/05/24   [name], [name], MD  pravastatin  (PRAVACHOL ) 40 MG tablet Take 1 tablet (40 mg total) by mouth daily. 04/23/24   [name], [name], MD    Scheduled Meds:  Continuous Infusions:  PRN Meds:   Allergies:   No Known Allergies  Social History:   Social History   Socioeconomic History   Marital status: Widowed    Spouse name: Not on file   Number of children: 4   Years of education: Not on file   Highest education level: Not on file  Occupational History   Occupation: reired  Tobacco Use   Smoking status: Former    Current packs/day: 0.00    Types: Cigarettes    Quit date: 11/04/1960    Years since quitting: 63.6   Smokeless tobacco: Never  Vaping Use   Vaping status: Never Used  Substance and Sexual Activity   Alcohol use: No   Drug use: No   Sexual activity: Not on file  Other Topics Concern   Not on file  Social History Narrative   Not on file   Social Drivers of Health   Financial Resource Strain: Low Risk  (12/01/2023)   Overall Financial Resource Strain (CARDIA)    Difficulty of Paying Living Expenses: Not hard at all  Food Insecurity: No Food Insecurity (12/01/2023)   Hunger Vital Sign    Worried About Running Out of Food in the Last Year: Never true    Ran Out of Food in the Last Year: Never true  Transportation Needs: No Transportation Needs (12/01/2023)   PRAPARE - Administrator, Civil Service (Medical): No    Lack of Transportation (Non-Medical): No  Physical Activity: Sufficiently Active (12/01/2023)   Exercise Vital Sign    Days of Exercise per Week: 3 days    Minutes of Exercise per Session: 120 min  Stress: No Stress Concern Present (12/01/2023)   [employer] of Occupational Health - Occupational Stress Questionnaire    Feeling of Stress : Not at all  Social Connections: Moderately Isolated (12/01/2023)   Social Connection and Isolation Panel    Frequency of Communication with Friends and Family: More than three times a week     Frequency of Social Gatherings with Friends and Family: Twice a week    Attends Religious Services: More than 4 times per year    Active Member of [employer] or Organizations: No    Attends Banker Meetings: Never    Marital Status: Widowed  Intimate Partner Violence: Not At Risk (12/01/2023)   Humiliation, Afraid, Rape, and Kick questionnaire    Fear of Current or Ex-Partner: No    Emotionally Abused: No    Physically Abused: No    Sexually Abused: No    Family History:    Family History  Problem Relation Age of Onset   Hypertension Mother  Hypertension Father    Allergic rhinitis Neg Hx    Angioedema Neg Hx    Asthma Neg Hx    Eczema Neg Hx    Immunodeficiency Neg Hx    Urticaria Neg Hx      ROS:  Please see the history of present illness.   All other ROS reviewed and negative.     Physical Exam/Data: Vitals:   06/25/24 2155 06/25/24 2156 06/25/24 2215  BP:  (!) 161/52 (!) 141/57  Pulse:  63 (!) 55  Resp:  16 16  Temp:  (!) 97.4 F (36.3 C)   TempSrc:  Oral   SpO2:  100% 98%  Weight: 72.6 kg    Height: 5' 10 (1.778 m)     No intake or output data in the 24 hours ending 06/26/24 0047    06/25/2024    9:55 PM 05/20/2024    9:15 AM 03/05/2024   10:22 AM  Last 3 Weights  Weight (lbs) 160 lb 158 lb 3.2 oz 159 lb  Weight (kg) 72.576 kg 71.759 kg 72.122 kg     Body mass index is 22.96 kg/m.  General:  Well nourished, well developed, in no acute distress HEENT: normal, dry MM Neck: no JVD Vascular: No carotid bruits; Distal pulses 2+ bilaterally Cardiac:  normal S1, S2; RRR; harsh crescendo systolic murmur at base Lungs:  clear to auscultation bilaterally, no wheezing, rhonchi or rales  Abd: soft, nontender, no hepatomegaly  Ext: no edema Musculoskeletal:  No deformities, BUE and BLE strength normal and equal Skin: warm and dry  Neuro:  CNs 2-12 intact, no focal abnormalities noted Psych:  Normal affect   EKG:  The EKG was personally reviewed  and demonstrates:  SR, RBBB, no evidence of ischemia Telemetry:  Telemetry was personally reviewed  Relevant CV Studies: Echo 04/2024 Severe AS with preserved EF   Laboratory Data: High Sensitivity Troponin:   Recent Labs  Lab 06/25/24 2206  TROPONINIHS 38*     Chemistry Recent Labs  Lab 06/25/24 2206  NA 141  K 4.1  CL 113*  CO2 20*  GLUCOSE 126*  BUN 22  CREATININE 1.71*  CALCIUM 9.0  GFRNONAA 37*  ANIONGAP 8    No results for input(s): PROT, ALBUMIN, AST, ALT, ALKPHOS, BILITOT in the last 168 hours. Lipids No results for input(s): CHOL, TRIG, HDL, LABVLDL, LDLCALC, CHOLHDL in the last 168 hours.  Hematology Recent Labs  Lab 06/25/24 2206  WBC 8.5  RBC 3.97*  HGB 12.8*  HCT 38.1*  MCV 96.0  MCH 32.2  MCHC 33.6  RDW 12.9  PLT 206   Thyroid  No results for input(s): TSH, FREET4 in the last 168 hours.  BNPNo results for input(s): BNP, PROBNP in the last 168 hours.  DDimer No results for input(s): DDIMER in the last 168 hours.  Radiology/Studies:  DG Chest 2 View Result Date: 06/25/2024 CLINICAL DATA:  Chest heaviness.  Shortness of breath. EXAM: CHEST - 2 VIEW COMPARISON:  01/19/2021 FINDINGS: Heart size and pulmonary vascularity are normal. Peribronchial thickening with perihilar opacities likely representing chronic bronchitis. No airspace disease or consolidation in the lungs. No pleural effusion or pneumothorax. Mediastinal contours appear intact. Old left rib fractures. Calcification of the aorta. IMPRESSION: Chronic bronchitic changes in the lungs.  No focal consolidation. Electronically Signed   By: Elsie Gravely M.D.   On: 06/25/2024 22:38     Assessment and Plan: 1. Feeling off with history of exertional chest heaviness and fatigue. Despite his advanced  age patient is quite active and his fatigue this last week seems to be a definite change. I am concerned about his valve given several day history of exertional  chest heaviness. He is currently chest pain free. He does have an AKI and on exam appears dry to euvolemic (dry MM, no LEE, JVP not elevated) and in the context of working outside today without adequate hydration I suspect that his AKI is related to hypovolemia; given severe AS he has a narrow euvolemic window and so would be gentle with fluids. He is hypothermic and reports nasal drainage and so possibly has a URI making him feel unwell and leading to dehydration. Would recommend the following: - gentle fluids; consider 250 cc, reasses, then given additional IVF - echo - workup for URI, eg extended respiratory viral panel - will reassess TAVR considerations given his exertional symptoms   2. Troponin elevation: suspect myocardial injury in the setting of AKI and hypovolemia. Troponin leak is modest and he is currently chest pain free and EKG is non-ischemic and so suspicion for ACS is low. However, given his history of PCI and several day history of intermittent exertional chest heaviness he may have progressive CAD. Exertional symptoms also may be attributable to his severe AS as above. Recommend: - monitor for recurrence of chest pain - we will consider LHC pending improvement of AKI, especially if ultimately pursuing TAVR workup     For questions or updates, please contact [facility] Please consult [email or web address] for contact info under    Signed, Dorn CHRISTELLA Flemings, MD  06/26/2024 12:47 AM

## 2024-06-26 NOTE — H&P (Signed)
 History and Physical    Patient: [name] FMW:[id number] DOB: 1930/03/24 DOA: 06/25/2024 DOS: the patient was seen and examined on 06/26/2024 PCP: [name], NP  Patient coming from: Home  Chief Complaint:  Chief Complaint  Patient presents with   Shortness of Breath   Chest Heaviness   HPI: [name] is a 89 y.o. male with medical history significant of coronary disease status post stents to RCA in 2012, severe aortic stenosis, hypertension,hyperlipidemia.  He presents to the emergency room with complaints of feeling unwell since today.  He had some complaints of feeling some pressure across his chest.  Denied any associated nausea, vomiting or palpitation.  Denies any shortness of breath or pedal edema.  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  He has been working in the sun.  Incidentally workup on this admission shows mild acute kidney injury.  He admits not drinking enough water.  In the ED, vital signs were noted to be stable.  Troponins were elevated 38 and 51 respectively.  Mild bradycardia noted on EKG, noted to be sinus.  Labs were significant for increased serum creatinine from baseline to 1.71.  Review of Systems: As mentioned in the history of present illness. All other systems reviewed and are negative. Past Medical History:  Diagnosis Date   Aortic stenosis    Coronary artery disease    PCI, STent to RCA   Diabetes mellitus without complication (HCC)    Hyperlipidemia    Hypothyroidism    Rash    Thyroid  disease    hypothyroidism   Past Surgical History:  Procedure Laterality Date   APPENDECTOMY     CARDIAC CATHETERIZATION     2007 occluded ramus inter. , PCI to RCA   CARPAL TUNNEL RELEASE     DECOMPRESSIVE LUMBAR LAMINECTOMY LEVEL 2 N/A 12/20/2022   Procedure: LUMBAR THREE THROUGH FIVE LAMINECTOMIES AND FORAMINOTOMIES;  Surgeon: Georgina Ozell LABOR, MD;  Location: [facility]  OR;  Service: Orthopedics;  Laterality: N/A;   EYE SURGERY Bilateral    cataract surgery   LUMBAR LAMINECTOMY/DECOMPRESSION MICRODISCECTOMY N/A 12/22/2022   Procedure: POSTERIOR LUMBAR DURAL REPAIR;  Surgeon: Georgina Ozell LABOR, MD;  Location: [facility] OR;  Service: Orthopedics;  Laterality: N/A;   TONSILLECTOMY     Social History:  reports that he quit smoking about 63 years ago. His smoking use included cigarettes. He has never used smokeless tobacco. He reports that he does not drink alcohol and does not use drugs.  No Known Allergies  Family History  Problem Relation Age of Onset   Hypertension Mother    Hypertension Father    Allergic rhinitis Neg Hx    Angioedema Neg Hx    Asthma Neg Hx    Eczema Neg Hx    Immunodeficiency Neg Hx    Urticaria Neg Hx     Prior to Admission medications   Medication Sig Start Date End Date Taking? Authorizing Provider  amLODipine  (NORVASC ) 5 MG tablet Take 1 tablet (5 mg total) by mouth daily. 05/19/24   [name], [name], MD  clobetasol cream (TEMOVATE) 0.05 % Apply 1 Application topically 2 (two) times daily.    [provider]  clopidogrel  (PLAVIX ) 75 MG tablet Take 1 tablet (75 mg total) by mouth daily. 04/05/24   [name], [name], MD  Dermatological Products, Misc. [facility] EX) Apply 1 Application topically 2 (two) times daily.    [provider]  diclofenac  Sodium (VOLTAREN  ARTHRITIS PAIN) 1 % GEL Apply 2 g topically 3 (three) times daily as needed. 10/20/23   Nche, [name], NP  fish oil-omega-3 fatty acids 1000 MG capsule Take 1 g by mouth daily.    [provider]  GLUCOSAMINE-CHONDROITIN PO Take 1 tablet by mouth daily.    [provider]  ketoconazole  (NIZORAL ) 2 % cream Apply 1 Application topically daily. 07/09/23   Nche, [name], NP  levothyroxine  (SYNTHROID ) 88 MCG tablet Take 1 tablet (88 mcg total) by mouth daily before breakfast. 12/22/23   Nche, [name], NP  losartan  (COZAAR ) 50 MG tablet  Take 1 tablet (50 mg total) by mouth daily. 04/05/24   [name], [name], MD  pravastatin  (PRAVACHOL ) 40 MG tablet Take 1 tablet (40 mg total) by mouth daily. 04/23/24   [name], [name], MD    Physical Exam: Vitals:   06/26/24 0100 06/26/24 0115 06/26/24 0130 06/26/24 0200  BP:    (!) 150/56  Pulse: (!) 57 63 (!) 55 (!) 53  Resp: 16 11 17 12   Temp:      TempSrc:      SpO2: 94% 100% 97% 97%  Weight:      Height:       General: Patient is a pleasant, elderly Caucasian male who looks well for his age. HEENT: Oral mucosa moist.  EOMI. Neck: Supple with no JVD Chest: Clinically clear without any added sounds. Cardiovascular: Systolic murmur present Abdomen soft nontender with no organomegaly bowel sounds present. CNS shows no obvious focal deficits.  Cranial nerves grossly intact Skin negative for any new rash. Extremities negative for any significant edema. Data Reviewed:  Sodium is 141, potassium 4.1, chloride 113, bicarb 20, glucose 126, BUN 22, creatinine 1.71, calcium 9 troponin 38 and 51 respectively, WBC 8.5, hemoglobin 12.8, hematocrit 38.1, platelet count 206, A1c 6.4 Echo from prior reviewed showed normal left ventricular ejection fraction of 55 to 60%.  Mild MR, severe aortic stenosis with mean AV gradient of 41 mmHg. Chest x-ray significant for chronic bronchitic changes without any consolidation. EKG significant for Sinus bradyacardia, RBBB Assessment and Plan: 89 year old gentleman who looks well for his age with known history of coronary disease, severe aortic stenosis who presents on account of chest pressure and feeling unwell.  Workup revealed mildly elevated troponin and acute kidney injury from baseline.  Acute kidney injury: Baseline chronic kidney disease stage III.  Increase in creatinine from baseline may likely be secondary to dehydration from patient working outside in the hot sun.  Patient will be adequately volume repleted.  Will monitor renal function.  Avoid  nephrotoxic medication.  Acute chest pain/pressure: Etiology is unclear.  Cardiology input appreciated.  Cardiac markers shows high-sensitivity troponin delta greater than 4.  Cardiology has recommended repeat echocardiogram.  Failed elevated troponin may be secondary to AKI.  Less likely ACS.  At baseline, patient is on dual antiplatelet therapy with aspirin and Plavix .  Hypothyroidism: Patient is on Synthroid .  Will continue with same.  Hyperlipidemia: Continue with statins per his home protocol  Diet controlled diabetes mellitus:  VTE prophylaxis with heparin   Advance Care Planning:   Code Status: Full Code   Consults: Cardiology consult appreciated  Family Communication: none  Severity of Illness: The appropriate patient status for this patient is OBSERVATION. Observation status is judged to be reasonable and necessary in order to provide the required intensity of service to ensure the patient's safety. The patient's presenting symptoms, physical  exam findings, and initial radiographic and laboratory data in the context of their medical condition is felt to place them at decreased risk for further clinical deterioration. Furthermore, it is anticipated that the patient will be medically stable for discharge from the hospital within 2 midnights of admission.   Author: Maude MARLA Dart, MD 06/26/2024 2:19 AM  For on call review [email or web address].

## 2024-06-26 NOTE — Plan of Care (Signed)

## 2024-06-26 NOTE — Progress Notes (Addendum)
 AVS completed printed and reviewed with patient . Pt expressed verbal understanding of discharge plan of care.  PIV removed by primary RN.  Patient HR 50-70's asymptomatic alert oriented and in good spirits.  Per RN, MD ok with asymptomatic HR.

## 2024-06-27 NOTE — Discharge Summary (Signed)
 Physician Discharge Summary   Patient: [name] MRN: [id number] DOB: [birth year]  Admit date:     06/25/2024  Discharge date: 06/26/2024  Discharge Physician: Yetta Blanch  PCP: [name], NP  Recommendations at discharge: Follow-up with PCP in 1 week. Follow-up with cardiology as recommended.  Discharge Diagnoses: Principal Problem:   Chest pain Active Problems:   Severe aortic stenosis   Hospital Course: Patient with PMH of CAD, severe arctic stenosis, HTN, HLD, hypothyroidism presents to the hospital with complaints of fatigue and tiredness. Patient is fairly active at his baseline, continues to work at [employer]. He has been working his lawn more and daughter noted that he has been more fatigued than his usual and therefore he was brought to the hospital. There was also report of some chest tightness which lasted for a few seconds by the patient. At the time of my evaluation on next day, patient denies any chest pain or chest tightness.  No nausea no vomiting no fever no chills.  No diarrhea no constipation. There was some concern about upper respiratory infection of the patient denies any fever or chills.  He has nasal congestion for a while. Denies any cough or shortness of breath. He denies any swelling in his legs. No orthopnea no PND. Denies any recent change in his medications as well. He thinks that he is not drinking enough water.  Assessment and plan. Chest pain. Severe aortic stenosis. CAD. Patient is seeing cardiology outpatient for aortic stenosis. Troponins are elevated but not consistent with ACS pattern. EKG unremarkable. Telemetry unremarkable. Cardiology was consulted. Currently recommending that the patient remains stable for discharge home from their point of view. Patient will follow-up with Dr. Verlin in the to evaluate for TAVR.  AKI. Patient's baseline creatinine appears to be around 1.3. On admission serum creatinine  was 1.7. Improved to 1.47 after fluids. Also has some metabolic acidosis likely secondary to hyperchloremia. Recommended to continue some IV fluid to further improve renal function although patient wanted to discharge home. Would recommend oral hydration on discharge. Will hold losartan . Outpatient follow-up with PCP recommended.  Essential hypertension. Sinus bradycardia. EKG negative for any arrhythmia. Telemetry negative for any arrhythmia. Blood pressure stable. Will continue with amlodipine  but hold losartan .  Hypothyroidism Patient presented with fatigue and tiredness.  Continuation of free T4 were checked for that. Remained stable. Resume Synthroid  at home dose.  HLD. Will continue statin.  Patient requested to be discharged home. Patient-recommended to avoid any exertional activities for now until cleared by PCP.  Consultants:  Cardiology  Procedures performed:  None  DISCHARGE MEDICATION: Allergies as of 06/26/2024   No Known Allergies      Medication List     STOP taking these medications    losartan  50 MG tablet Commonly known as: COZAAR        TAKE these medications    amLODipine  5 MG tablet Commonly known as: NORVASC  Take 1 tablet (5 mg total) by mouth daily.   clopidogrel  75 MG tablet Commonly known as: PLAVIX  Take 1 tablet (75 mg total) by mouth daily.   diclofenac  Sodium 1 % Gel Commonly known as: VOLTAREN  Apply 2 g topically daily as needed (bilateral shoulder pain.).   fish oil-omega-3 fatty acids 1000 MG capsule Take 1 g by mouth daily.   GLUCOSAMINE-CHONDROITIN PO Take 1 tablet by mouth daily.   levothyroxine  88 MCG tablet Commonly known as: SYNTHROID  Take 1 tablet (88 mcg total) by mouth daily before breakfast.  pravastatin  40 MG tablet Commonly known as: PRAVACHOL  Take 1 tablet (40 mg total) by mouth daily.       Disposition: Home Diet recommendation: Cardiac diet  Discharge Exam: Vitals:   06/26/24 0323  06/26/24 0822 06/26/24 1313 06/26/24 1653  BP: (!) 117/101   (!) 143/60  Pulse: 65   (!) 56  Resp: 15 20 18 17   Temp: 97.8 F (36.6 C) (!) 97.5 F (36.4 C) 98.8 F (37.1 C) 98 F (36.7 C)  TempSrc: Oral Oral Oral Oral  SpO2: 98%   98%  Weight: 73.4 kg     Height:       Clear to auscultation. S1-S2 present. Aortic systolic murmur present. No edema. No focal deficit.  Alert awake and oriented x 3.  Finger-nose-finger normal.  No pronator drift. Bowel sound present. Filed Weights   06/25/24 2155 06/26/24 0323  Weight: 72.6 kg 73.4 kg   Condition at discharge: stable  The results of significant diagnostics from this hospitalization (including imaging, microbiology, ancillary and laboratory) are listed below for reference.   Imaging Studies: DG Chest 2 View Result Date: 06/25/2024 CLINICAL DATA:  Chest heaviness.  Shortness of breath. EXAM: CHEST - 2 VIEW COMPARISON:  01/19/2021 FINDINGS: Heart size and pulmonary vascularity are normal. Peribronchial thickening with perihilar opacities likely representing chronic bronchitis. No airspace disease or consolidation in the lungs. No pleural effusion or pneumothorax. Mediastinal contours appear intact. Old left rib fractures. Calcification of the aorta. IMPRESSION: Chronic bronchitic changes in the lungs.  No focal consolidation. Electronically Signed   By: Elsie Gravely M.D.   On: 06/25/2024 22:38    Microbiology: Results for orders placed or performed during the hospital encounter of 12/12/22  Surgical pcr screen     Status: Abnormal   Collection Time: 12/12/22 10:10 AM   Specimen: Nasal Mucosa; Nasal Swab  Result Value Ref Range Status   MRSA, PCR NEGATIVE NEGATIVE Final   Staphylococcus aureus POSITIVE (A) NEGATIVE Final    Comment: (NOTE) The Xpert SA Assay (FDA approved for NASAL specimens in patients 61 years of age and older), is one component of a comprehensive surveillance program. It is not intended to diagnose  infection nor to guide or monitor treatment. Performed at [facility], [address], [place], [place] [address]    Labs: CBC: Recent Labs  Lab 06/25/24 2206 06/26/24 0923  WBC 8.5 8.7  HGB 12.8* 13.5  HCT 38.1* 40.0  MCV 96.0 94.8  PLT 206 213   Basic Metabolic Panel: Recent Labs  Lab 06/25/24 2206 06/26/24 0923  NA 141 140  K 4.1 3.9  CL 113* 113*  CO2 20* 18*  GLUCOSE 126* 165*  BUN 22 21  CREATININE 1.71* 1.47*  CALCIUM 9.0 8.8*  MG  --  1.8   Liver Function Tests: No results for input(s): AST, ALT, ALKPHOS, BILITOT, PROT, ALBUMIN in the last 168 hours. CBG: No results for input(s): GLUCAP in the last 168 hours.  Discharge time spent: greater than 30 minutes.  Author: Yetta Blanch, MD  [facility] 06/26/2024

## 2024-06-28 ENCOUNTER — Telehealth: Payer: Self-pay

## 2024-06-28 NOTE — Transitions of Care (Post Inpatient/ED Visit) (Signed)
   06/28/2024  Name: [name] MRN: [id number] DOB: 01/14/1930  Today's TOC FU Call Status: Today's TOC FU Call Status:: Successful TOC FU Call Completed TOC FU Call Complete Date: 06/28/24 Patient's Name and Date of Birth confirmed.  Transition Care Management Follow-up Telephone Call Date of Discharge: 06/26/24 Discharge Facility: [facility]) Type of Discharge: Inpatient Admission Primary Inpatient Discharge Diagnosis:: chest pain How have you been since you were released from the hospital?: Better Any questions or concerns?: No  Items Reviewed: Did you receive and understand the discharge instructions provided?: Yes Medications obtained,verified, and reconciled?: Yes (Medications Reviewed) Any new allergies since your discharge?: No Dietary orders reviewed?: Yes Do you have support at home?: No  Medications Reviewed Today: Medications Reviewed Today     Reviewed by [name], LPN (Licensed Practical Nurse) on 06/28/24 at 1612  Med List Status: <None>   Medication Order Taking? Sig Documenting Provider Last Dose Status Informant  amLODipine  (NORVASC ) 5 MG tablet [id number] Yes Take 1 tablet (5 mg total) by mouth daily. [name], [name], MD  Active Self, Pharmacy Records  clopidogrel  (PLAVIX ) 75 MG tablet [id number] Yes Take 1 tablet (75 mg total) by mouth daily. [name], [name], MD  Active Self, Pharmacy Records  diclofenac  Sodium (VOLTAREN ) 1 % GEL [id number] Yes Apply 2 g topically daily as needed (bilateral shoulder pain.). [provider]  Active Self, Pharmacy Records  fish oil-omega-3 fatty acids 1000 MG capsule [id number] Yes Take 1 g by mouth daily. [provider]  Active Self, Pharmacy Records  GLUCOSAMINE-CHONDROITIN PO [id number] Yes Take 1 tablet by mouth daily. [provider]  Active Self, Pharmacy Records  levothyroxine  (SYNTHROID ) 88 MCG tablet [id number] Yes Take 1 tablet (88 mcg total) by mouth daily before breakfast.  Nche, [name], NP  Active Self, Pharmacy Records  pravastatin  (PRAVACHOL ) 40 MG tablet [id number] Yes Take 1 tablet (40 mg total) by mouth daily. [name], [name], MD  Active Self, Pharmacy Records            Home Care and Equipment/Supplies: Were Home Health Services Ordered?: NA Any new equipment or medical supplies ordered?: NA  Functional Questionnaire: Do you need assistance with bathing/showering or dressing?: No Do you need assistance with meal preparation?: No Do you need assistance with eating?: No Do you have difficulty maintaining continence: No Do you need assistance with getting out of bed/getting out of a chair/moving?: No Do you have difficulty managing or taking your medications?: No  Follow up appointments reviewed: PCP Follow-up appointment confirmed?: No (no avail appt, sent message to staff to schedule) MD Provider Line Number:[phone] Given: No Specialist Hospital Follow-up appointment confirmed?: NA Do you need transportation to your follow-up appointment?: No Do you understand care options if your condition(s) worsen?: Yes-patient verbalized understanding    SIGNATURE Pan Emmitt, LPN [facility] Nurse Health Advisor Direct Dial [phone]

## 2024-06-29 NOTE — Telephone Encounter (Signed)
 Noted. Dm/cma

## 2024-07-06 ENCOUNTER — Ambulatory Visit: Admitting: Nurse Practitioner

## 2024-07-06 ENCOUNTER — Encounter: Payer: Self-pay | Admitting: Nurse Practitioner

## 2024-07-06 VITALS — BP 120/60 | HR 67 | Temp 97.6°F | Ht 68.0 in | Wt 157.2 lb

## 2024-07-06 DIAGNOSIS — Z23 Encounter for immunization: Secondary | ICD-10-CM | POA: Diagnosis not present

## 2024-07-06 DIAGNOSIS — E1165 Type 2 diabetes mellitus with hyperglycemia: Secondary | ICD-10-CM | POA: Diagnosis not present

## 2024-07-06 DIAGNOSIS — E1169 Type 2 diabetes mellitus with other specified complication: Secondary | ICD-10-CM

## 2024-07-06 DIAGNOSIS — N1831 Chronic kidney disease, stage 3a: Secondary | ICD-10-CM

## 2024-07-06 DIAGNOSIS — I10 Essential (primary) hypertension: Secondary | ICD-10-CM

## 2024-07-06 DIAGNOSIS — E785 Hyperlipidemia, unspecified: Secondary | ICD-10-CM | POA: Diagnosis not present

## 2024-07-06 LAB — MICROALBUMIN / CREATININE URINE RATIO
Creatinine,U: 80.1 mg/dL
Microalb Creat Ratio: UNDETERMINED mg/g (ref 0.0–30.0)
Microalb, Ur: <0.7 mg/dL

## 2024-07-06 LAB — COMPREHENSIVE METABOLIC PANEL WITH GFR
ALT: 18 U/L (ref 0–53)
AST: 20 U/L (ref 0–37)
Albumin: 4.2 g/dL (ref 3.5–5.2)
Alkaline Phosphatase: 62 U/L (ref 39–117)
BUN: 24 mg/dL — ABNORMAL HIGH (ref 6–23)
CO2: 26 meq/L (ref 19–32)
Calcium: 9.3 mg/dL (ref 8.4–10.5)
Chloride: 106 meq/L (ref 96–112)
Creatinine, Ser: 1.51 mg/dL — ABNORMAL HIGH (ref 0.40–1.50)
GFR: 40.08 mL/min — ABNORMAL LOW (ref >60.00)
Glucose, Bld: 99 mg/dL (ref 70–99)
Potassium: 4.2 meq/L (ref 3.5–5.1)
Sodium: 142 meq/L (ref 135–145)
Total Bilirubin: 0.4 mg/dL (ref 0.2–1.2)
Total Protein: 6.5 g/dL (ref 6.0–8.3)

## 2024-07-06 LAB — LDL CHOLESTEROL, DIRECT: Direct LDL: 66 mg/dL

## 2024-07-06 LAB — HEMOGLOBIN A1C: Hgb A1c MFr Bld: 6.2 % (ref 4.6–6.5)

## 2024-07-06 NOTE — Assessment & Plan Note (Signed)
 Repeat UACr, hgbA1c, LDL, CMP No neuropathy Advised to schedule DIABETES eye exam BP at goal Current use of a statin

## 2024-07-06 NOTE — Assessment & Plan Note (Signed)
 States he was not aware, losartan  had to be held post hospital discharge Normal BP today with losartan , and amlodipine  BP Readings from Last 3 Encounters:  07/06/24 120/60  06/26/24 (!) 143/60  05/20/24 (!) 118/56    Repeat CMP today Encourage in increased oral hydration with water-60oz daily at least. Maintain current med doses

## 2024-07-06 NOTE — Assessment & Plan Note (Signed)
 AKI possibly due to dehydration. 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

## 2024-07-06 NOTE — Progress Notes (Signed)
 Established Patient Visit  Patient: [name]   DOB: October 25, 1930   89 y.o. Male  MRN: [id number] Visit Date: 07/06/2024  Subjective:    Chief Complaint  Patient presents with   Hospitalization Follow-up    Hospital follow-up, no new complaints or medication refills needed today .    HPI He reports resolved chest pain and fatigue since hospital discharge on 06/26/24. Reviewed hospital discharge summary, lab results, radiology report, and reconciled med list.  Hypertension States he was not aware, losartan  had to be held post hospital discharge Normal BP today with losartan , and amlodipine  BP Readings from Last 3 Encounters:  07/06/24 120/60  06/26/24 (!) 143/60  05/20/24 (!) 118/56    Repeat CMP today Encourage in increased oral hydration with water-60oz daily at least. Maintain current med doses  Hyperlipidemia associated with type 2 diabetes mellitus (HCC) Repeat LDL Maintain pravastatin  dose  CKD (chronic kidney disease) stage 3, GFR 30-59 ml/min AKI possibly due to dehydration. 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   clopidogrel  (PLAVIX ) 75 MG tablet Take 1 tablet (75 mg total) by mouth daily.   diclofenac  Sodium (VOLTAREN ) 1 % GEL Apply 2 g topically daily as needed (bilateral shoulder pain.).   fish oil-omega-3 fatty acids 1000 MG capsule Take 1 g by mouth daily.   GLUCOSAMINE-CHONDROITIN PO Take 1 tablet by mouth daily.   levothyroxine  (SYNTHROID ) 88 MCG tablet Take 1 tablet (88  mcg total) by mouth daily before breakfast.   pravastatin  (PRAVACHOL ) 40 MG tablet Take 1 tablet (40 mg total) by mouth daily.   No facility-administered medications prior to visit.   Reviewed past medical and social history.   ROS per HPI above      Objective:  BP 120/60   Pulse 67   Temp 97.6 F (36.4 C)   Ht 5' 8 (1.727 m)   Wt 157 lb 3.2 oz (71.3 kg)   SpO2 97%   BMI 23.90 kg/m      Physical Exam Vitals and nursing note reviewed.  Cardiovascular:     Rate and Rhythm: Normal rate and regular rhythm.     Pulses: Normal pulses.     Heart sounds: Murmur heard.  Pulmonary:     Effort: Pulmonary effort is normal.     Breath sounds: Normal breath sounds.  Musculoskeletal:     Right lower leg: No edema.     Left lower leg: No edema.  Neurological:     Mental Status: He is alert and oriented to person, place, and time.  Psychiatric:        Mood and Affect: Mood normal.        Behavior: Behavior normal.        Thought Content: Thought content normal.     No results found for any visits on 07/06/24.    Assessment & Plan:    Problem List Items Addressed This Visit     CKD (chronic kidney disease) stage  3, GFR 30-59 ml/min (HCC)   AKI possibly due to dehydration. 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 of Gatorade daily  Repeat CMP Advised about the importance of adequate oral hydration      Relevant Orders   Comprehensive metabolic panel with GFR   Microalbumin / creatinine urine ratio   DM (diabetes mellitus) (HCC)   Repeat UACr, hgbA1c, LDL, CMP No neuropathy Advised to schedule DIABETES eye exam BP at goal Current use of a statin      Relevant Orders   Comprehensive metabolic panel with GFR   Hemoglobin A1c   Microalbumin / creatinine urine ratio   Hyperlipidemia associated with type 2 diabetes mellitus (HCC)   Repeat LDL Maintain pravastatin  dose      Relevant Orders   Direct LDL   Hypertension - Primary    States he was not aware, losartan  had to be held post hospital discharge Normal BP today with losartan , and amlodipine  BP Readings from Last 3 Encounters:  07/06/24 120/60  06/26/24 (!) 143/60  05/20/24 (!) 118/56    Repeat CMP today Encourage in increased oral hydration with water-60oz daily at least. Maintain current med doses      Relevant Orders   Comprehensive metabolic panel with GFR   Other Visit Diagnoses       Immunization due       Relevant Orders   Pneumococcal conjugate vaccine 20-valent (Prevnar 20) (Completed)   Flu vaccine HIGH DOSE PF(Fluzone Trivalent) (Completed)      Return in about 6 months (around 01/03/2025) for HTN, DM, hyperlipidemia (fasting).     Roselie Mood, NP

## 2024-07-06 NOTE — Assessment & Plan Note (Signed)
 Repeat LDL Maintain pravastatin  dose

## 2024-07-06 NOTE — Patient Instructions (Signed)
 Go to lab Maintain Heart healthy diet and daily exercise. Need to maintain at least 60oz of water daily to improve renal function Maintain current medications.

## 2024-07-07 ENCOUNTER — Ambulatory Visit: Payer: Self-pay | Admitting: Nurse Practitioner

## 2024-07-07 DIAGNOSIS — I10 Essential (primary) hypertension: Secondary | ICD-10-CM

## 2024-07-07 DIAGNOSIS — N1831 Chronic kidney disease, stage 3a: Secondary | ICD-10-CM

## 2024-09-06 ENCOUNTER — Ambulatory Visit: Payer: Self-pay

## 2024-09-06 ENCOUNTER — Encounter (HOSPITAL_COMMUNITY): Payer: Self-pay

## 2024-09-06 ENCOUNTER — Inpatient Hospital Stay (HOSPITAL_COMMUNITY)
Admission: EM | Admit: 2024-09-06 | Discharge: 2024-09-12 | DRG: 286 | Disposition: A | Discharge status: Home / Self Care | Attending: Internal Medicine | Admitting: Internal Medicine

## 2024-09-06 ENCOUNTER — Ambulatory Visit

## 2024-09-06 ENCOUNTER — Telehealth: Payer: Self-pay

## 2024-09-06 ENCOUNTER — Other Ambulatory Visit: Payer: Self-pay

## 2024-09-06 ENCOUNTER — Ambulatory Visit (INDEPENDENT_AMBULATORY_CARE_PROVIDER_SITE_OTHER): Admitting: Internal Medicine

## 2024-09-06 ENCOUNTER — Encounter: Payer: Self-pay | Admitting: Internal Medicine

## 2024-09-06 VITALS — BP 115/64 | HR 66 | Temp 97.8°F | Ht 69.0 in | Wt 159.4 lb

## 2024-09-06 DIAGNOSIS — I25110 Atherosclerotic heart disease of native coronary artery with unstable angina pectoris: Secondary | ICD-10-CM | POA: Diagnosis not present

## 2024-09-06 DIAGNOSIS — I080 Rheumatic disorders of both mitral and aortic valves: Secondary | ICD-10-CM | POA: Diagnosis not present

## 2024-09-06 DIAGNOSIS — Z823 Family history of stroke: Secondary | ICD-10-CM

## 2024-09-06 DIAGNOSIS — J90 Pleural effusion, not elsewhere classified: Secondary | ICD-10-CM

## 2024-09-06 DIAGNOSIS — E785 Hyperlipidemia, unspecified: Secondary | ICD-10-CM | POA: Diagnosis present

## 2024-09-06 DIAGNOSIS — I509 Heart failure, unspecified: Secondary | ICD-10-CM | POA: Diagnosis not present

## 2024-09-06 DIAGNOSIS — Y831 Surgical operation with implant of artificial internal device as the cause of abnormal reaction of the patient, or of later complication, without mention of misadventure at the time of the procedure: Secondary | ICD-10-CM | POA: Diagnosis present

## 2024-09-06 DIAGNOSIS — Z8249 Family history of ischemic heart disease and other diseases of the circulatory system: Secondary | ICD-10-CM | POA: Diagnosis not present

## 2024-09-06 DIAGNOSIS — R001 Bradycardia, unspecified: Secondary | ICD-10-CM | POA: Diagnosis not present

## 2024-09-06 DIAGNOSIS — N179 Acute kidney failure, unspecified: Secondary | ICD-10-CM | POA: Diagnosis not present

## 2024-09-06 DIAGNOSIS — N1832 Chronic kidney disease, stage 3b: Secondary | ICD-10-CM | POA: Diagnosis present

## 2024-09-06 DIAGNOSIS — R0602 Shortness of breath: Secondary | ICD-10-CM

## 2024-09-06 DIAGNOSIS — I272 Pulmonary hypertension, unspecified: Secondary | ICD-10-CM | POA: Diagnosis not present

## 2024-09-06 DIAGNOSIS — Z87891 Personal history of nicotine dependence: Secondary | ICD-10-CM

## 2024-09-06 DIAGNOSIS — I255 Ischemic cardiomyopathy: Secondary | ICD-10-CM | POA: Diagnosis not present

## 2024-09-06 DIAGNOSIS — E1122 Type 2 diabetes mellitus with diabetic chronic kidney disease: Secondary | ICD-10-CM | POA: Diagnosis not present

## 2024-09-06 DIAGNOSIS — Z79899 Other long term (current) drug therapy: Secondary | ICD-10-CM | POA: Diagnosis not present

## 2024-09-06 DIAGNOSIS — I5021 Acute systolic (congestive) heart failure: Secondary | ICD-10-CM

## 2024-09-06 DIAGNOSIS — I4510 Unspecified right bundle-branch block: Secondary | ICD-10-CM | POA: Diagnosis not present

## 2024-09-06 DIAGNOSIS — Z7902 Long term (current) use of antithrombotics/antiplatelets: Secondary | ICD-10-CM | POA: Diagnosis not present

## 2024-09-06 DIAGNOSIS — I2510 Atherosclerotic heart disease of native coronary artery without angina pectoris: Secondary | ICD-10-CM | POA: Diagnosis not present

## 2024-09-06 DIAGNOSIS — R0789 Other chest pain: Secondary | ICD-10-CM

## 2024-09-06 DIAGNOSIS — I5033 Acute on chronic diastolic (congestive) heart failure: Secondary | ICD-10-CM | POA: Diagnosis not present

## 2024-09-06 DIAGNOSIS — I2489 Other forms of acute ischemic heart disease: Secondary | ICD-10-CM | POA: Diagnosis not present

## 2024-09-06 DIAGNOSIS — E039 Hypothyroidism, unspecified: Secondary | ICD-10-CM | POA: Diagnosis present

## 2024-09-06 DIAGNOSIS — Z955 Presence of coronary angioplasty implant and graft: Secondary | ICD-10-CM

## 2024-09-06 DIAGNOSIS — Z79890 Hormone replacement therapy (postmenopausal): Secondary | ICD-10-CM

## 2024-09-06 DIAGNOSIS — J811 Chronic pulmonary edema: Secondary | ICD-10-CM | POA: Diagnosis not present

## 2024-09-06 DIAGNOSIS — I5023 Acute on chronic systolic (congestive) heart failure: Secondary | ICD-10-CM | POA: Diagnosis not present

## 2024-09-06 DIAGNOSIS — T82855A Stenosis of coronary artery stent, initial encounter: Secondary | ICD-10-CM | POA: Diagnosis not present

## 2024-09-06 DIAGNOSIS — I2582 Chronic total occlusion of coronary artery: Secondary | ICD-10-CM | POA: Diagnosis present

## 2024-09-06 DIAGNOSIS — J9811 Atelectasis: Secondary | ICD-10-CM | POA: Diagnosis not present

## 2024-09-06 DIAGNOSIS — I493 Ventricular premature depolarization: Secondary | ICD-10-CM | POA: Diagnosis not present

## 2024-09-06 DIAGNOSIS — I5043 Acute on chronic combined systolic (congestive) and diastolic (congestive) heart failure: Secondary | ICD-10-CM | POA: Diagnosis not present

## 2024-09-06 DIAGNOSIS — R0902 Hypoxemia: Secondary | ICD-10-CM | POA: Diagnosis present

## 2024-09-06 DIAGNOSIS — I130 Hypertensive heart and chronic kidney disease with heart failure and stage 1 through stage 4 chronic kidney disease, or unspecified chronic kidney disease: Principal | ICD-10-CM | POA: Diagnosis present

## 2024-09-06 DIAGNOSIS — Z634 Disappearance and death of family member: Secondary | ICD-10-CM

## 2024-09-06 DIAGNOSIS — I10 Essential (primary) hypertension: Secondary | ICD-10-CM | POA: Diagnosis not present

## 2024-09-06 DIAGNOSIS — E1159 Type 2 diabetes mellitus with other circulatory complications: Secondary | ICD-10-CM | POA: Diagnosis not present

## 2024-09-06 LAB — BASIC METABOLIC PANEL WITH GFR
BUN: 23 mg/dL (ref 6–23)
CO2: 23 meq/L (ref 19–32)
Calcium: 9.8 mg/dL (ref 8.4–10.5)
Chloride: 102 meq/L (ref 96–112)
Creatinine, Ser: 1.57 mg/dL — ABNORMAL HIGH (ref 0.40–1.50)
GFR: 38.2 mL/min — ABNORMAL LOW (ref >60.00)
Glucose, Bld: 106 mg/dL — ABNORMAL HIGH (ref 70–99)
Potassium: 4.7 meq/L (ref 3.5–5.1)
Sodium: 134 meq/L — ABNORMAL LOW (ref 135–145)

## 2024-09-06 LAB — TROPONIN I (HIGH SENSITIVITY)
High Sens Troponin I: 30 ng/L (ref 2–17)
Troponin I (High Sensitivity): 36 ng/L — ABNORMAL HIGH (ref <18)

## 2024-09-06 LAB — CBC WITH DIFFERENTIAL/PLATELET
Abs Immature Granulocytes: 0.02 K/uL (ref 0.00–0.07)
Basophils Absolute: 0 K/uL (ref 0.0–0.1)
Basophils Absolute: 0.1 K/uL (ref 0.0–0.1)
Basophils Relative: 0.5 % (ref 0.0–3.0)
Basophils Relative: 1 %
Eosinophils Absolute: 0.2 K/uL (ref 0.0–0.7)
Eosinophils Absolute: 0.2 K/uL (ref 0.0–0.5)
Eosinophils Relative: 2.9 % (ref 0.0–5.0)
Eosinophils Relative: 3 %
HCT: 42.8 % (ref 39.0–52.0)
HCT: 40.9 % (ref 39.0–52.0)
Hemoglobin: 14.7 g/dL (ref 13.0–17.0)
Hemoglobin: 13.8 g/dL (ref 13.0–17.0)
Immature Granulocytes: 0 %
Lymphocytes Relative: 15.4 % (ref 12.0–46.0)
Lymphocytes Relative: 22 %
Lymphs Abs: 1.2 K/uL (ref 0.7–4.0)
Lymphs Abs: 1.9 K/uL (ref 0.7–4.0)
MCH: 31.9 pg (ref 26.0–34.0)
MCHC: 34.2 g/dL (ref 30.0–36.0)
MCHC: 33.7 g/dL (ref 30.0–36.0)
MCV: 93.2 fl (ref 78.0–100.0)
MCV: 94.5 fL (ref 80.0–100.0)
Monocytes Absolute: 1 K/uL (ref 0.1–1.0)
Monocytes Absolute: 1.1 K/uL — ABNORMAL HIGH (ref 0.1–1.0)
Monocytes Relative: 12.4 % — ABNORMAL HIGH (ref 3.0–12.0)
Monocytes Relative: 12 %
Neutro Abs: 5.6 K/uL (ref 1.4–7.7)
Neutro Abs: 5.5 K/uL (ref 1.7–7.7)
Neutrophils Relative %: 68.8 % (ref 43.0–77.0)
Neutrophils Relative %: 62 %
Platelets: 267 K/uL (ref 150.0–400.0)
Platelets: 273 K/uL (ref 150–400)
RBC: 4.6 Mil/uL (ref 4.22–5.81)
RBC: 4.33 MIL/uL (ref 4.22–5.81)
RDW: 13.1 % (ref 11.5–15.5)
RDW: 12.6 % (ref 11.5–15.5)
WBC: 8.1 K/uL (ref 4.0–10.5)
WBC: 8.8 K/uL (ref 4.0–10.5)
nRBC: 0 % (ref 0.0–0.2)

## 2024-09-06 LAB — COMPREHENSIVE METABOLIC PANEL WITH GFR
ALT: 18 U/L (ref 0–44)
AST: 19 U/L (ref 15–41)
Albumin: 3.8 g/dL (ref 3.5–5.0)
Alkaline Phosphatase: 64 U/L (ref 38–126)
Anion gap: 11 (ref 5–15)
BUN: 22 mg/dL (ref 8–23)
CO2: 21 mmol/L — ABNORMAL LOW (ref 22–32)
Calcium: 9.4 mg/dL (ref 8.9–10.3)
Chloride: 102 mmol/L (ref 98–111)
Creatinine, Ser: 1.57 mg/dL — ABNORMAL HIGH (ref 0.61–1.24)
GFR, Estimated: 41 mL/min — ABNORMAL LOW (ref >60)
Glucose, Bld: 112 mg/dL — ABNORMAL HIGH (ref 70–99)
Potassium: 4.6 mmol/L (ref 3.5–5.1)
Sodium: 134 mmol/L — ABNORMAL LOW (ref 135–145)
Total Bilirubin: 1.1 mg/dL (ref 0.0–1.2)
Total Protein: 6.4 g/dL — ABNORMAL LOW (ref 6.5–8.1)

## 2024-09-06 LAB — PRO B NATRIURETIC PEPTIDE: NT-Pro BNP: 6771 pg/mL — ABNORMAL HIGH (ref 0–486)

## 2024-09-06 LAB — BRAIN NATRIURETIC PEPTIDE: B Natriuretic Peptide: 1123.1 pg/mL — ABNORMAL HIGH (ref 0.0–100.0)

## 2024-09-06 MED ORDER — MELATONIN 5 MG PO TABS
5.0000 mg | ORAL_TABLET | Freq: Every evening | ORAL | Status: DC | PRN
  Administered 2024-09-07: 5 mg via ORAL
  Filled 2024-09-06: qty 1

## 2024-09-06 MED ORDER — ACETAMINOPHEN 500 MG PO TABS
500.0000 mg | ORAL_TABLET | Freq: Four times a day (QID) | ORAL | Status: DC | PRN
  Administered 2024-09-09: 500 mg via ORAL
  Filled 2024-09-06 (×2): qty 1

## 2024-09-06 MED ORDER — PROCHLORPERAZINE EDISYLATE 10 MG/2ML IJ SOLN
5.0000 mg | Freq: Four times a day (QID) | INTRAMUSCULAR | Status: DC | PRN
Start: 2024-09-06 — End: 2024-09-12
  Administered 2024-09-09: 5 mg via INTRAVENOUS
  Filled 2024-09-06: qty 2

## 2024-09-06 MED ORDER — POLYETHYLENE GLYCOL 3350 17 G PO PACK: 17.0000 g | PACK | Freq: Every day | ORAL | Status: DC | PRN

## 2024-09-06 MED ORDER — ENOXAPARIN SODIUM 40 MG/0.4ML IJ SOSY
40.0000 mg | PREFILLED_SYRINGE | Freq: Every day | INTRAMUSCULAR | Status: DC
  Administered 2024-09-07: 40 mg via SUBCUTANEOUS
  Filled 2024-09-06 (×2): qty 0.4

## 2024-09-06 MED ORDER — FUROSEMIDE 10 MG/ML IJ SOLN
20.0000 mg | Freq: Once | INTRAMUSCULAR | Status: AC
  Administered 2024-09-07: 20 mg via INTRAVENOUS
  Filled 2024-09-06: qty 2

## 2024-09-06 MED ORDER — LEVOTHYROXINE SODIUM 88 MCG PO TABS
88.0000 ug | ORAL_TABLET | Freq: Every day | ORAL | Status: DC
  Administered 2024-09-07 – 2024-09-12 (×6): 88 ug via ORAL
  Filled 2024-09-06 (×6): qty 1

## 2024-09-06 MED ORDER — PRAVASTATIN SODIUM 40 MG PO TABS
40.0000 mg | ORAL_TABLET | Freq: Every day | ORAL | Status: DC
Start: 2024-09-06 — End: 2024-09-10
  Administered 2024-09-07 – 2024-09-10 (×4): 40 mg via ORAL
  Filled 2024-09-06: qty 4
  Filled 2024-09-06 (×4): qty 1

## 2024-09-06 NOTE — ED Triage Notes (Signed)
 Pt arrived from home via POV c/o sob. Pt states that he is unable to walk across the room without getting out of breath. Pt states that this is abnormal for him

## 2024-09-06 NOTE — Telephone Encounter (Signed)
 Spoke with patient told him due to his lab result he needed to go to the ED per PCP. He stated it would be 30 mins to 1 hour but he would go after work. Pt requested to call daughter LVM for her to call back.

## 2024-09-06 NOTE — ED Provider Notes (Signed)
 [place] [facility] AT [facility] Provider Note   CSN: [id number] Arrival date & time: 09/06/24  1845     Patient presents with: Shortness of Breath   [name] is a 89 y.o. male.   HPI   89 year old male presents emergency department with concern for shortness of breath, orthopnea.  Patient states that this has been ongoing for the past couple weeks, progressed to the point that he is unable to lay flat.  Was seen by his primary doctor as an outpatient.  Had labs and a chest x-ray done that was abnormal and he was referred here.  He denies any acute chest pain.  Denies any lower extremity swelling.  No recent fever, cough, illness.  Prior to Admission medications   Medication Sig Start Date End Date Taking? Authorizing Provider  amLODipine  (NORVASC ) 5 MG tablet Take 1 tablet (5 mg total) by mouth daily. 05/19/24   [name], [name], MD  clopidogrel  (PLAVIX ) 75 MG tablet Take 1 tablet (75 mg total) by mouth daily. 04/05/24   [name], [name], MD  diclofenac  Sodium (VOLTAREN ) 1 % GEL Apply 2 g topically daily as needed (bilateral shoulder pain.).    [provider]  fish oil-omega-3 fatty acids 1000 MG capsule Take 1 g by mouth daily.    [provider]  GLUCOSAMINE-CHONDROITIN PO Take 1 tablet by mouth daily.    [provider]  levothyroxine  (SYNTHROID ) 88 MCG tablet Take 1 tablet (88 mcg total) by mouth daily before breakfast. 12/22/23   Nche, [name], NP  losartan  (COZAAR ) 25 MG tablet Take 1 tablet (25 mg total) by mouth daily. 07/07/24   Nche, [name], NP  pravastatin  (PRAVACHOL ) 40 MG tablet Take 1 tablet (40 mg total) by mouth daily. 04/23/24   [name], [name], MD    Allergies: Patient has no known allergies.    Review of Systems  Constitutional:  Positive for fatigue. Negative for fever.  Respiratory:  Positive for shortness of breath.   Cardiovascular:  Negative for chest pain, palpitations and leg swelling.   Gastrointestinal:  Negative for abdominal pain, diarrhea and vomiting.  Skin:  Negative for rash.  Neurological:  Negative for headaches.    Updated Vital Signs BP (!) 114/48   Pulse 66   Temp (!) 97.4 F (36.3 C)   Resp 16   Ht 5' 9 (1.753 m)   Wt 72.1 kg   SpO2 95%   BMI 23.48 kg/m   Physical Exam Vitals and nursing note reviewed.  Constitutional:      General: He is not in acute distress.    Appearance: Normal appearance.  HENT:     Head: Normocephalic.     Mouth/Throat:     Mouth: Mucous membranes are moist.  Cardiovascular:     Rate and Rhythm: Normal rate.  Pulmonary:     Effort: Pulmonary effort is normal. No respiratory distress.  Abdominal:     Palpations: Abdomen is soft.     Tenderness: There is no abdominal tenderness.  Musculoskeletal:     Right lower leg: No edema.     Left lower leg: No edema.  Skin:    General: Skin is warm.  Neurological:     Mental Status: He is alert and oriented to person, place, and time. Mental status is at baseline.  Psychiatric:        Mood and Affect: Mood normal.     (all labs ordered are listed, but only abnormal  results are displayed) Labs Reviewed  CBC WITH DIFFERENTIAL/PLATELET - Abnormal; Notable for the following components:      Result Value   Monocytes Absolute 1.1 (*)    All other components within normal limits  COMPREHENSIVE METABOLIC PANEL WITH GFR - Abnormal; Notable for the following components:   Sodium 134 (*)    CO2 21 (*)    Glucose, Bld 112 (*)    Creatinine, Ser 1.57 (*)    Total Protein 6.4 (*)    GFR, Estimated 41 (*)    All other components within normal limits  BRAIN NATRIURETIC PEPTIDE - Abnormal; Notable for the following components:   B Natriuretic Peptide 1,123.1 (*)    All other components within normal limits  TROPONIN I (HIGH SENSITIVITY) - Abnormal; Notable for the following components:   Troponin I (High Sensitivity) 36 (*)    All other components within normal limits   TROPONIN I (HIGH SENSITIVITY)    EKG: EKG Interpretation Date/Time:  Monday September 06 2024 19:53:35 EST Ventricular Rate:  55 PR Interval:  144 QRS Duration:  150 QT Interval:  472 QTC Calculation: 451 R Axis:   152  Text Interpretation: Sinus bradycardia Right bundle branch block Anterior infarct , age undetermined Abnormal ECG When compared with ECG of 25-Jun-2024 21:57, PREVIOUS ECG IS PRESENT RBBB old Confirmed by Bari Flank [phone]) on 09/06/2024 9:42:08 PM  Radiology: [facility] Chest 2 View Result Date: 09/06/2024 EXAM: 2 VIEW(S) XRAY OF THE CHEST 09/06/2024 03:51:25 PM COMPARISON: 06/25/2024 CLINICAL HISTORY: Shortness of breath with exertion. FINDINGS: LUNGS AND PLEURA: Moderate left and small right pleural effusions with bibasilar atelectasis, new from previous exam. Increased interstitial markings compatible with mild edema. No pneumothorax. HEART AND MEDIASTINUM: Aortic atherosclerosis. No acute abnormality of the cardiac and mediastinal silhouettes. BONES AND SOFT TISSUES: Polyarticular degenerative change. No acute osseous abnormality. IMPRESSION: 1. Moderate left and small right pleural effusions with bibasilar atelectasis, new from prior exam. 2. Mild interstitial pulmonary edema. Electronically signed by: Waddell Calk MD 09/06/2024 04:32 PM EST RP Workstation: [id number]     Procedures   Medications Ordered in the ED  furosemide (LASIX) injection 20 mg (has no administration in time range)                                    Medical Decision Making Risk Prescription drug management. Decision regarding hospitalization.   89 year old male presents emergency room with worsening shortness of breath, orthopnea, outpatient labs concerning for new onset CHF.  Vitals are normal and stable.  He is in no respiratory distress but easily gets short of breath.  He has decreased breath sounds bilaterally, scattered rales.  Review of outpatient chest x-ray shows pulmonary  vascular congestion with bilateral pleural effusions.  Outpatient blood work also shows mildly elevated troponin and elevated BNP.  Repeat blood work here shows elevated but flat troponin, elevated BNP.  EKG shows no acute ischemic changes.  Review of most recent echo in May/2025 showed EF of 55 to 60%.  Patient will require admission for shortness of breath, exertional hypoxia, new pulmonary vascular congestion/pleural effusion and possible CHF.  Patients evaluation and results requires admission for further treatment and care.  Spoke with hospitalist, reviewed patient's ED course and they accept admission.  Patient agrees with admission plan, offers no new complaints and is stable/unchanged at time of admit.     Final diagnoses:  SOB (shortness of breath)  Pleural effusion    ED Discharge Orders     None          Bari Roxie HERO, DO 09/06/24 2253

## 2024-09-06 NOTE — Telephone Encounter (Signed)
  FYI Only or Action Required?: FYI only for provider: appointment scheduled on 09/06/2024.  Patient was last seen in primary care on 07/06/2024 by Nche, [name], NP.  Called Nurse Triage reporting Shortness of Breath.  Symptoms began several days ago.  Interventions attempted: Nothing.  Symptoms are: unchanged.  Triage Disposition: See HCP Within 4 Hours (Or PCP Triage)  Patient/caregiver understands and will follow disposition?: Yes  Copied from CRM [id number]. Topic: Clinical - Red Word Triage >> Sep 06, 2024 10:49 AM Revonda D wrote: Red Word that prompted transfer to Nurse Triage: SOB, breathing issue  Pt stated that he is experiencing SOB, breathing issues, low fever, weakness, and also has drainage in his throat. Pt would like to schedule an appt with provider today if possible. Reason for Disposition  [1] MILD difficulty breathing (e.g., minimal/no SOB at rest, SOB with walking, pulse < 100) AND [2] NEW-onset or WORSE than normal  Answer Assessment - Initial Assessment Questions 1. RESPIRATORY STATUS: Describe your breathing? (e.g., wheezing, shortness of breath, unable to speak, severe coughing)      Short of breath with minimal activity and at rest.  Sometimes feels like he cannot catch his breath, feels like he is drowning 2. ONSET: When did this breathing problem begin?      Two days ago 3. PATTERN Does the difficult breathing come and go, or has it been constant since it started?      Constant over the past couple of days 4. SEVERITY: How bad is your breathing? (e.g., mild, moderate, severe)      moderate 5. RECURRENT SYMPTOM: Have you had difficulty breathing before? If Yes, ask: When was the last time? and What happened that time?      Has not had this problem in the past 6. CARDIAC HISTORY: Do you have any history of heart disease? (e.g., heart attack, angina, bypass surgery, angioplasty)      Htn, history of MI 7. LUNG HISTORY: Do you have any  history of lung disease?  (e.g., pulmonary embolus, asthma, emphysema)     denies 8. CAUSE: What do you think is causing the breathing problem?      unknown 9. OTHER SYMPTOMS: Do you have any other symptoms? (e.g., chest pain, cough, dizziness, fever, runny nose)     Sinus drainage 10. O2 SATURATION MONITOR:  Do you use an oxygen saturation monitor (pulse oximeter) at home? If Yes, ask: What is your reading (oxygen level) today? What is your usual oxygen saturation reading? (e.g., 95%)       90-95%, HR 65.  Patient states that this HR is normal for him  Protocols used: Breathing Difficulty-A-AH

## 2024-09-06 NOTE — Progress Notes (Unsigned)
 [facility] PRIMARY [facility]ARE [facility] [address] [place] [address] Dept: [phone] Dept Fax: [phone]  Acute Care Office Visit  Subjective:   [name] 1930/07/05 09/06/2024  Chief Complaint  Patient presents with   Constipation    For 3-4 days SOB, chest pain when lay down on left side more soreness    HPI:  Discussed the use of AI scribe software for clinical note transcription with the patient, who gave verbal consent to proceed.  History of Present Illness   [name] is a 89 year old male with coronary artery disease and severe aortic stenosis who presents with shortness of breath and chest pain.  He has been experiencing shortness of breath and chest pain for the past three to four days. The chest pain is described as a muscle ache, occurring a couple of times when lying down. The shortness of breath worsens with exertion and when lying down, but improves when sitting up, although it never fully resolves. He recalls a similar episode about a month ago, which led to a hospital visit where he was found to be dehydrated and received IV fluids.   He has a history of two heart attacks, neither of which he recognized at the time. His recent cardiac evaluations include an echocardiogram earlier this year, which showed adequate EF of 55 to 60%, and he is scheduled for another soon. He follows up with his cardiologist, with the next appointment scheduled for January 9th.  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 He has also been involved in physically demanding tasks at home, such as tearing out and replacing bathroom flooring.  He is unsure if the increased workload has caused his symptoms.  No heart palpitations, significant leg swelling, recent long car rides, or flights. No fever or cough.        The following portions of the patient's history were reviewed and updated as appropriate: past medical history, past surgical history, family history, social history, allergies, medications, and problem list.   Patient Active Problem List   Diagnosis Date Noted   Acute HFrEF (heart failure with reduced ejection fraction) (HCC) 09/07/2024   Acute on chronic congestive heart failure (HCC) 09/06/2024   Chest pain 06/26/2024   Glenohumeral arthritis, left 10/20/2023   Fall 07/22/2023   Scaly patch rash 06/30/2023   Severe aortic stenosis 03/11/2023   Incidental durotomy 12/22/2022   Lumbar stenosis with neurogenic claudication 12/20/2022   Bilateral carotid artery disease, unspecified type 11/21/2022   Chronic left-sided low back pain with left-sided sciatica 09/07/2021   Pruritic rash 12/13/2020   CKD (chronic kidney disease) stage 3, GFR 30-59 ml/min (HCC) 12/24/2019   DM (diabetes mellitus) (HCC) 12/09/2019   Hyperlipidemia associated with type 2 diabetes mellitus (HCC) 10/09/2015   Hypothyroidism    CAD (coronary artery disease) 02/12/2011   Hypertension 02/12/2011   Past Medical History:  Diagnosis Date   Aortic stenosis    Coronary artery disease    PCI, STent to RCA   Diabetes mellitus without complication (HCC)    Hyperlipidemia    Hypothyroidism    Rash    Thyroid  disease    hypothyroidism   Past Surgical History:  Procedure Laterality Date   APPENDECTOMY     CARDIAC CATHETERIZATION     2007 occluded ramus inter. , PCI to RCA   CARPAL TUNNEL RELEASE  DECOMPRESSIVE LUMBAR LAMINECTOMY LEVEL 2 N/A 12/20/2022   Procedure: LUMBAR THREE THROUGH FIVE LAMINECTOMIES AND FORAMINOTOMIES;  Surgeon: Georgina Ozell LABOR, MD;  Location: [facility] OR;  Service: Orthopedics;  Laterality: N/A;   EYE SURGERY Bilateral    cataract surgery   LUMBAR LAMINECTOMY/DECOMPRESSION MICRODISCECTOMY N/A 12/22/2022   Procedure: POSTERIOR LUMBAR DURAL REPAIR;  Surgeon: Georgina Ozell LABOR, MD;  Location:  [facility] OR;  Service: Orthopedics;  Laterality: N/A;   RIGHT HEART CATH AND CORONARY ANGIOGRAPHY N/A 09/09/2024   Procedure: RIGHT HEART CATH AND CORONARY ANGIOGRAPHY;  Surgeon: Mady Bruckner, MD;  Location: [facility] INVASIVE CV LAB;  Service: Cardiovascular;  Laterality: N/A;   TONSILLECTOMY     Family History  Problem Relation Age of Onset   Hypertension Mother    Hypertension Father    Allergic rhinitis Neg Hx    Angioedema Neg Hx    Asthma Neg Hx    Eczema Neg Hx    Immunodeficiency Neg Hx    Urticaria Neg Hx    No current facility-administered medications for this visit. No current outpatient medications on file.  Facility-Administered Medications Ordered in Other Visits:    0.9 %  sodium chloride  infusion, 250 mL, Intravenous, PRN, End, Bruckner, MD   acetaminophen  (TYLENOL ) tablet 500 mg, 500 mg, Oral, Q6H PRN, End, Christopher, MD, 500 mg at 09/09/24 1215   aspirin EC tablet 81 mg, 81 mg, Oral, Daily, Vicci Rollo SAUNDERS, PA-C   [START ON 09/11/2024] atorvastatin (LIPITOR) tablet 40 mg, 40 mg, Oral, Daily, Johnson, Kathleen R, PA-C   clopidogrel  (PLAVIX ) tablet 75 mg, 75 mg, Oral, Daily, Johnson, Kathleen R, PA-C, 75 mg at 09/10/24 1057   enoxaparin (LOVENOX) injection 40 mg, 40 mg, Subcutaneous, Q24H, End, Christopher, MD, 40 mg at 09/10/24 0913   levothyroxine  (SYNTHROID ) tablet 88 mcg, 88 mcg, Oral, Q0600, End, Christopher, MD, 88 mcg at 09/10/24 0654   melatonin tablet 5 mg, 5 mg, Oral, QHS PRN, End, Christopher, MD, 5 mg at 09/07/24 2132   multivitamin with minerals tablet 1 tablet, 1 tablet, Oral, Daily, End, Christopher, MD, 1 tablet at 09/10/24 0913   omega-3 acid ethyl esters (LOVAZA ) capsule 1 g, 1 g, Oral, Daily, End, Christopher, MD, 1 g at 09/10/24 0913   polyethylene glycol (MIRALAX  / GLYCOLAX ) packet 17 g, 17 g, Oral, Daily PRN, End, Bruckner, MD   prochlorperazine (COMPAZINE) injection 5 mg, 5 mg, Intravenous, Q6H PRN, End, Christopher, MD, 5 mg at 09/09/24 1230    sodium chloride  flush (NS) 0.9 % injection 3 mL, 3 mL, Intravenous, Q12H, End, Christopher, MD, 3 mL at 09/10/24 0914   sodium chloride  flush (NS) 0.9 % injection 3 mL, 3 mL, Intravenous, PRN, End, Christopher, MD No Known Allergies   ROS: A complete ROS was performed with pertinent positives/negatives noted in the HPI. The remainder of the ROS are negative.    Objective:   Today's Vitals   09/06/24 1423  BP: 115/64  Pulse: 66  Temp: 97.8 F (36.6 C)  TempSrc: Temporal  SpO2: 96%  Weight: 159 lb 6.4 oz (72.3 kg)  Height: 5' 9 (1.753 m)    GENERAL: Well-appearing, in NAD. Well nourished.  SKIN: Pink, warm and dry. No rash.  NECK: Trachea midline. Full ROM w/o pain or tenderness. No lymphadenopathy.  RESPIRATORY: Chest wall symmetrical. Respirations even and non-labored at rest, significant dyspnea with exertion. Breath sounds clear to auscultation bilaterally.  CARDIAC: S1, S2 present, regular rate and rhythm. Peripheral pulses 2+ bilaterally.  MSK: Muscle tone and strength  appropriate for age. Joints w/o tenderness, redness, or swelling. EXTREMITIES: Without clubbing, cyanosis, or edema.  NEUROLOGIC: No motor or sensory deficits. Steady, even gait.  PSYCH/MENTAL STATUS: Alert, oriented x 3. Cooperative, appropriate mood and affect.   EKG RESULT: EKG tracing is personally reviewed.   EKG: unchanged from previous tracings.      Assessment & Plan:  Assessment and Plan    Shortness of breath and chest pain evaluation - Ordered EKG. - Ordered chest x-ray. - Ordered CBC, CMP, troponin, and proBNP. - SpO2 decreased to 88% on room air with ambulation at PCP office and patient exhibited significant dyspnea with ambulation - Advised patient that he needs to go to the ER due to significant dyspnea on exertion.  Patient declined at this time and prefers to obtain lab work and imaging in PCP office at this time.  Will await lab and imaging results.  Patient was advised if symptoms  worsen then he must proceed to the ER.  Orders Placed This Encounter  Procedures   DG Chest 2 View    Standing Status:   Future    Number of Occurrences:   1    Expiration Date:   09/06/2025    Reason for Exam (SYMPTOM  OR DIAGNOSIS REQUIRED):   shortnes of breath with exertion    Preferred imaging location?:   Internal   CBC with Differential/Platelet   Basic Metabolic Panel (BMET)   Pro b natriuretic peptide (BNP)   EKG 12-Lead   Lab Orders         CBC with Differential/Platelet         Basic Metabolic Panel (BMET)         Pro b natriuretic peptide (BNP)     No images are attached to the encounter or orders placed in the encounter.  Return if symptoms worsen or fail to improve.   Rosina Senters, FNP

## 2024-09-06 NOTE — Telephone Encounter (Signed)
 Patient needs to go to ER. His chest xray shows fluid in both lungs and his oxygen level is decreased in office when he exerts himself.

## 2024-09-06 NOTE — Telephone Encounter (Signed)
 Noted patient is scheduled to see Rosina Senters, FNP today 09/06/24 at 2:20 PM

## 2024-09-06 NOTE — Telephone Encounter (Addendum)
 Received a call from [facility] from the [place] lab. She called in a critical lab for a Troponin of 30. Sending to ordering provider. Also went and spoke to provider about the call.

## 2024-09-06 NOTE — ED Triage Notes (Addendum)
 Pt sent by doctor for further evaluation of pleural effusions on both lungs, seen on x ray from today, as well as low 02 sats; also has elevated troponin of 30; denies pain, denies sob at rest, worsens with exertion

## 2024-09-06 NOTE — H&P (Incomplete)
 History and Physical  [name]:[id number] DOB: Jan 06, 1930 DOA: 09/06/2024  Referring physician: Dr. Bari, EDP  PCP: [name], NP  Outpatient Specialists: Cardiology. Patient coming from: Home.  Chief Complaint: Shortness of breath.  HPI: [name] is a 89 y.o. male with medical history significant for coronary artery disease, severe aortic stenosis, hypertension, hyperlipidemia, hypothyroidism, CKD 3B, who presents to the ER due to worsening shortness of breath for the past 2 days.  Associated with orthopnea and discomfort when laying down.  He went to see his PCP today and was noted to be dyspneic with ambulation.  Workup done outpatient in the office revealed concern for acute on chronic HFpEF.  He was advised to go to the ER for further evaluation.  In the ER, tachypneic, BNP greater than 1100, chest x-ray with moderate left and small right pleural effusions with bibasilar atelectasis new from prior exam.  Mild interstitial pulmonary edema.  Troponin 36.  The patient received IV Lasix 20 mg x 1.  TRH, hospitalist service, was asked to admit.  At the time of this visit the patient is alert and oriented x 3.  Breathing is improved at rest.  He denies having any chest pain.  His grandson who is paramedics is at bedside.   ED Course: Temperature 98.1.  BP 112/54, pulse 53, respiratory 23, O2 saturation 97% on room air.  Review of Systems: Review of systems as noted in the HPI. All other systems reviewed and are negative.   Past Medical History:  Diagnosis Date   Aortic stenosis    Coronary artery disease    PCI, STent to RCA   Diabetes mellitus without complication (HCC)    Hyperlipidemia    Hypothyroidism    Rash    Thyroid  disease    hypothyroidism   Past Surgical History:  Procedure Laterality Date   APPENDECTOMY     CARDIAC CATHETERIZATION     2007 occluded ramus inter. , PCI to RCA   CARPAL TUNNEL RELEASE     DECOMPRESSIVE LUMBAR  LAMINECTOMY LEVEL 2 N/A 12/20/2022   Procedure: LUMBAR THREE THROUGH FIVE LAMINECTOMIES AND FORAMINOTOMIES;  Surgeon: Georgina Ozell LABOR, MD;  Location: [facility] OR;  Service: Orthopedics;  Laterality: N/A;   EYE SURGERY Bilateral    cataract surgery   LUMBAR LAMINECTOMY/DECOMPRESSION MICRODISCECTOMY N/A 12/22/2022   Procedure: POSTERIOR LUMBAR DURAL REPAIR;  Surgeon: Georgina Ozell LABOR, MD;  Location: [facility] OR;  Service: Orthopedics;  Laterality: N/A;   TONSILLECTOMY      Social History:  reports that he quit smoking about 63 years ago. His smoking use included cigarettes. He has never used smokeless tobacco. He reports that he does not drink alcohol and does not use drugs.   No Known Allergies  Family History  Problem Relation Age of Onset   Hypertension Mother    Hypertension Father    Allergic rhinitis Neg Hx    Angioedema Neg Hx    Asthma Neg Hx    Eczema Neg Hx    Immunodeficiency Neg Hx    Urticaria Neg Hx       Prior to Admission medications   Medication Sig Start Date End Date Taking? Authorizing Provider  amLODipine  (NORVASC ) 5 MG tablet Take 1 tablet (5 mg total) by mouth daily. 05/19/24   [name], [name], MD  clopidogrel  (PLAVIX ) 75 MG tablet Take 1 tablet (75 mg total) by mouth daily. 04/05/24   [name], [name], MD  diclofenac  Sodium (VOLTAREN ) 1 % GEL  Apply 2 g topically daily as needed (bilateral shoulder pain.).    [provider]  fish oil-omega-3 fatty acids 1000 MG capsule Take 1 g by mouth daily.    [provider]  GLUCOSAMINE-CHONDROITIN PO Take 1 tablet by mouth daily.    [provider]  levothyroxine  (SYNTHROID ) 88 MCG tablet Take 1 tablet (88 mcg total) by mouth daily before breakfast. 12/22/23   Nche, [name], NP  losartan  (COZAAR ) 25 MG tablet Take 1 tablet (25 mg total) by mouth daily. 07/07/24   Nche, [name], NP  pravastatin  (PRAVACHOL ) 40 MG tablet Take 1 tablet (40 mg total) by mouth daily. 04/23/24   [name], [name], MD     Physical Exam: BP (!) 143/68   Pulse 96   Temp 98.1 F (36.7 C)   Resp (!) 34   Ht 5' 9 (1.753 m)   Wt 72.1 kg   SpO2 94%   BMI 23.48 kg/m   General: 89 y.o. year-old male well developed well nourished in no acute distress.  Alert and oriented x3. Cardiovascular: Regular rate and rhythm with no rubs or gallops.  No thyromegaly or JVD noted.  No lower extremity edema. 2/4 pulses in all 4 extremities. Respiratory: Faint rales at bases. Good inspiratory effort. Abdomen: Soft nontender nondistended with normal bowel sounds x4 quadrants. Muskuloskeletal: No cyanosis, clubbing or edema noted bilaterally Neuro: CN II-XII intact, strength, sensation, reflexes Skin: No ulcerative lesions noted or rashes Psychiatry: Judgement and insight appear normal. Mood is appropriate for condition and setting          Labs on Admission:  Basic Metabolic Panel: Recent Labs  Lab 09/06/24 1530 09/06/24 2019 09/07/24 0133  NA 134* 134* 134*  K 4.7 4.6 4.0  CL 102 102 104  CO2 23 21* 19*  GLUCOSE 106* 112* 180*  BUN 23 22 23   CREATININE 1.57* 1.57* 1.60*  CALCIUM 9.8 9.4 9.1  MG  --   --  2.0  PHOS  --   --  3.0   Liver Function Tests: Recent Labs  Lab 09/06/24 2019  AST 19  ALT 18  ALKPHOS 64  BILITOT 1.1  PROT 6.4*  ALBUMIN 3.8   No results for input(s): LIPASE, AMYLASE in the last 168 hours. No results for input(s): AMMONIA in the last 168 hours. CBC: Recent Labs  Lab 09/06/24 1530 09/06/24 2019 09/07/24 0133  WBC 8.1 8.8 8.6  NEUTROABS 5.6 5.5  --   HGB 14.7 13.8 13.7  HCT 42.8 40.9 40.2  MCV 93.2 94.5 93.1  PLT 267.0 273 266   Cardiac Enzymes: No results for input(s): CKTOTAL, CKMB, CKMBINDEX, TROPONINI in the last 168 hours.  BNP (last 3 results) Recent Labs    09/06/24 2019  BNP 1,123.1*    ProBNP (last 3 results) Recent Labs    09/06/24 1530  PROBNP 6,771*    CBG: No results for input(s): GLUCAP in the last 168  hours.  Radiological Exams on Admission: DG Chest 2 View Result Date: 09/06/2024 EXAM: 2 VIEW(S) XRAY OF THE CHEST 09/06/2024 03:51:25 PM COMPARISON: 06/25/2024 CLINICAL HISTORY: Shortness of breath with exertion. FINDINGS: LUNGS AND PLEURA: Moderate left and small right pleural effusions with bibasilar atelectasis, new from previous exam. Increased interstitial markings compatible with mild edema. No pneumothorax. HEART AND MEDIASTINUM: Aortic atherosclerosis. No acute abnormality of the cardiac and mediastinal silhouettes. BONES AND SOFT TISSUES: Polyarticular degenerative change. No acute osseous abnormality. IMPRESSION: 1. Moderate left and small right pleural effusions  with bibasilar atelectasis, new from prior exam. 2. Mild interstitial pulmonary edema. Electronically signed by: Waddell Calk MD 09/06/2024 04:32 PM EST RP Workstation: [id number]    EKG: I independently viewed the EKG done and my findings are as followed: Sinus pericardia rate of 55.  Nonspecific ST changes.  QTc 451.  Assessment/Plan Present on Admission: **None**  Principal Problem:   Acute on chronic congestive heart failure (HCC)  Acute on chronic HFpEF Severe aortic valve stenosis Last 2D echo done on 04/12/2024 showing LVEF 55 to 60% with severe calcification of the aortic valve, severe aortic valve stenosis. Presented with BNP greater than 1100, bilateral pleural effusions right greater than left, with pulmonary edema. Consult cardiology in the morning Closely monitor on telemetry Follow transthoracic echocardiogram Strict I's and O's and daily weight Resume home losartan  when blood pressure allows.  Elevated troponin, suspect demand ischemia Troponin are flat 36, 38, 37 No evidence of acute ischemia on 12-lead EKG Follow-up 2D echo. Closely monitor on telemetry.  Coronary artery disease Resume home clopidogrel , fish oil, and pravastatin   Hypothyroidism Resume home levothyroxine   Hyperlipidemia Resume  home provide statin.  Hypertension Blood pressures are currently soft Losartan  held to prevent hypotension Closely monitor vital signs  CKD 3B Creatinine 1.5, at baseline Avoid nephrotoxic agents and hypotension Closely monitor urine output Repeat BMP in the morning.   Critical care time: 55 minutes.   DVT prophylaxis: Subcu Lovenox daily  Code Status: Full code.  Family Communication: Grandson at bedside who is also a paramedic.  Disposition Plan: Admitted to telemetry unit.  Consults called: None.  Admission status: Inpatient status.  Consult cardiology in the morning.   Status is: Inpatient The patient requires at least 2 midnights for further evaluation and treatment of present condition.   Terry LOISE Hurst MD [facility] Pager [phone]  If 7PM-7AM, please contact night-coverage [email or web address] Password [facility]  09/07/2024, 5:56 AM

## 2024-09-06 NOTE — ED Provider Triage Note (Signed)
 Emergency Medicine Provider Triage Evaluation Note  [name] , a 89 y.o. male  was evaluated in triage.  Pt complains of shortness of breath. Sent from dr for same.  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 Reports that for the past 1 week he has been having dyspnea on exertion. Significant dyspnea when he sleeps at night. New cough as well. Went to his doctor and had elevated BNP and troponin with new pleural effusions on CXR, sent here for evaluation. Denies CHF history. No leg swelling. No chest pain.  Review of Systems  Positive:  Negative:   Physical Exam  BP (!) 114/48   Pulse 66   Temp (!) 97.4 F (36.3 C)   Resp 16   SpO2 95%  Gen:   Awake, no distress   Resp:  Normal effort  MSK:   Moves extremities without difficulty  Other:    Medical Decision Making  Medically screening exam initiated at 8:21 PM.  Appropriate orders placed.  [name] was informed that the remainder of the evaluation will be completed by another provider, this initial triage assessment does not replace that evaluation, and the importance of remaining in the ED until their evaluation is complete.     [name] LABOR, PA-C 09/06/24 2025

## 2024-09-06 NOTE — ED Notes (Signed)
 Pt not in wpt room ready..checking triage

## 2024-09-07 ENCOUNTER — Inpatient Hospital Stay (HOSPITAL_COMMUNITY)

## 2024-09-07 DIAGNOSIS — I5021 Acute systolic (congestive) heart failure: Secondary | ICD-10-CM

## 2024-09-07 DIAGNOSIS — I2510 Atherosclerotic heart disease of native coronary artery without angina pectoris: Secondary | ICD-10-CM

## 2024-09-07 DIAGNOSIS — I5033 Acute on chronic diastolic (congestive) heart failure: Secondary | ICD-10-CM | POA: Diagnosis not present

## 2024-09-07 DIAGNOSIS — E785 Hyperlipidemia, unspecified: Secondary | ICD-10-CM | POA: Diagnosis not present

## 2024-09-07 DIAGNOSIS — I10 Essential (primary) hypertension: Secondary | ICD-10-CM

## 2024-09-07 LAB — BASIC METABOLIC PANEL WITH GFR
Anion gap: 11 (ref 5–15)
BUN: 23 mg/dL (ref 8–23)
CO2: 19 mmol/L — ABNORMAL LOW (ref 22–32)
Calcium: 9.1 mg/dL (ref 8.9–10.3)
Chloride: 104 mmol/L (ref 98–111)
Creatinine, Ser: 1.6 mg/dL — ABNORMAL HIGH (ref 0.61–1.24)
GFR, Estimated: 40 mL/min — ABNORMAL LOW (ref >60)
Glucose, Bld: 180 mg/dL — ABNORMAL HIGH (ref 70–99)
Potassium: 4 mmol/L (ref 3.5–5.1)
Sodium: 134 mmol/L — ABNORMAL LOW (ref 135–145)

## 2024-09-07 LAB — ECHOCARDIOGRAM COMPLETE
AR max vel: 0.83 cm2
AV Area VTI: 0.86 cm2
AV Area mean vel: 0.83 cm2
AV Mean grad: 35 mmHg
AV Peak grad: 63.4 mmHg
Ao pk vel: 3.98 m/s
Area-P 1/2: 3.28 cm2
Calc EF: 46.1 %
Height: 69 in
MV M vel: 4.8 m/s
MV Peak grad: 92.2 mmHg
MV VTI: 1.99 cm2
P 1/2 time: 498 ms
Radius: 0.5 cm
S' Lateral: 4.6 cm
Single Plane A2C EF: 46.5 %
Single Plane A4C EF: 52.9 %
Weight: 2544 [oz_av]

## 2024-09-07 LAB — TROPONIN I (HIGH SENSITIVITY)
Troponin I (High Sensitivity): 38 ng/L — ABNORMAL HIGH (ref <18)
Troponin I (High Sensitivity): 37 ng/L — ABNORMAL HIGH (ref <18)

## 2024-09-07 LAB — CBC
HCT: 40.2 % (ref 39.0–52.0)
Hemoglobin: 13.7 g/dL (ref 13.0–17.0)
MCH: 31.7 pg (ref 26.0–34.0)
MCHC: 34.1 g/dL (ref 30.0–36.0)
MCV: 93.1 fL (ref 80.0–100.0)
Platelets: 266 K/uL (ref 150–400)
RBC: 4.32 MIL/uL (ref 4.22–5.81)
RDW: 12.7 % (ref 11.5–15.5)
WBC: 8.6 K/uL (ref 4.0–10.5)
nRBC: 0 % (ref 0.0–0.2)

## 2024-09-07 LAB — PHOSPHORUS: Phosphorus: 3 mg/dL (ref 2.5–4.6)

## 2024-09-07 LAB — MAGNESIUM: Magnesium: 2 mg/dL (ref 1.7–2.4)

## 2024-09-07 MED ORDER — ADULT MULTIVITAMIN W/MINERALS CH
1.0000 | ORAL_TABLET | Freq: Every day | ORAL | Status: DC
  Administered 2024-09-07 – 2024-09-12 (×6): 1 via ORAL
  Filled 2024-09-07 (×6): qty 1

## 2024-09-07 MED ORDER — CLOPIDOGREL BISULFATE 75 MG PO TABS
75.0000 mg | ORAL_TABLET | Freq: Every day | ORAL | Status: DC
Start: 2024-09-07 — End: 2024-09-09
  Administered 2024-09-07 – 2024-09-09 (×3): 75 mg via ORAL
  Filled 2024-09-07 (×4): qty 1

## 2024-09-07 MED ORDER — FUROSEMIDE 10 MG/ML IJ SOLN
40.0000 mg | Freq: Two times a day (BID) | INTRAMUSCULAR | Status: DC
  Administered 2024-09-07 (×2): 40 mg via INTRAVENOUS
  Filled 2024-09-07 (×3): qty 4

## 2024-09-07 MED ORDER — OMEGA-3-ACID ETHYL ESTERS 1 G PO CAPS
1.0000 g | ORAL_CAPSULE | Freq: Every day | ORAL | Status: DC
  Administered 2024-09-07 – 2024-09-12 (×6): 1 g via ORAL
  Filled 2024-09-07 (×6): qty 1

## 2024-09-07 NOTE — Consult Note (Addendum)
 Cardiology Consultation   Patient ID: [name] MRN: [id number]; DOB: August 20, 1930  Admit date: 09/06/2024 Date of Consult: 09/07/2024  PCP:  [name], NP   [facility] Providers Cardiologist:  Lonni Cash, MD    Patient Profile: [name] is a 89 y.o. male with a hx of CAD s/p stenting to RCA, DM, HTN, HLD, hypothyroidism and severe aortic stenosis who is being seen 09/07/2024 for the evaluation of CHF/AS at the request of Dr. Fairy.  History of Present Illness: [name] is a 89 yo male with PMH noted above. Has been followed by Dr. Alveta and referred to Dr. Cash for structural heart disease. Hx of CAD and stenting of the RCA '12. Echo 04/2024 with LVEF 55-60%, moderate LVH, mild MR, severe aortic stenosis with mean gradient , AVA 0.78 cm2, DI 0.23. As a result he was referred to structural team for further evaluation and seen in the clinic with Dr. Cash 05/2024. It was felt he would likely be a good candidate for TAVR but was not symptomatic at that time.   He is a very active 89 year old male who lives independently.  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  Also is currently tearing out floors and renovating his bathroom at home.  States over the past couple of weeks specifically he has noticed increasing shortness of breath with his physical activity.  Has to frequently stop and catch his breath.  Has not been able to keep up with his usual stamina.   He presented to his PCP office yesterday with complaints of shortness of breath, orthopnea and PND.  He was sent for a chest x-ray and found to have moderate left-sided and small right-sided pleural effusions with atelectasis.  Called by his PCP and sent to the ED for further evaluation.  In the ED his  labs showed sodium 134, potassium 4.6, creatinine 1.57, BNP 1123, high-sensitivity troponin 36>> 38>> 37, WBC 8.8, hemoglobin 13.8.  EKG shows sinus bradycardia, 55 bpm, right bundle branch block.  He was given IV Lasix 20 mg x 1 and an additional 40 mg IV x 1 with documented urine output of 1.1 L thus far but states he has probably filled the urinal 4 times.  Admitted to internal medicine for further management.  Cardiology now asked to evaluate.    Past Medical History:  Diagnosis Date   Aortic stenosis    Coronary artery disease    PCI, STent to RCA   Diabetes mellitus without complication (HCC)    Hyperlipidemia    Hypothyroidism    Rash    Thyroid  disease    hypothyroidism    Past Surgical History:  Procedure Laterality Date   APPENDECTOMY     CARDIAC CATHETERIZATION     2007 occluded ramus inter. , PCI to RCA   CARPAL TUNNEL RELEASE     DECOMPRESSIVE LUMBAR LAMINECTOMY LEVEL 2 N/A 12/20/2022   Procedure: LUMBAR THREE THROUGH FIVE LAMINECTOMIES AND FORAMINOTOMIES;  Surgeon: Georgina Ozell LABOR, MD;  Location: [facility] OR;  Service: Orthopedics;  Laterality: N/A;   EYE SURGERY Bilateral    cataract surgery   LUMBAR LAMINECTOMY/DECOMPRESSION MICRODISCECTOMY N/A 12/22/2022   Procedure: POSTERIOR LUMBAR DURAL REPAIR;  Surgeon: Georgina Ozell LABOR, MD;  Location: [facility] OR;  Service: Orthopedics;  Laterality: N/A;   TONSILLECTOMY  Scheduled Meds:  clopidogrel   75 mg Oral Daily   enoxaparin (LOVENOX) injection  40 mg Subcutaneous Daily   furosemide  40 mg Intravenous BID   levothyroxine   88 mcg Oral Q0600   multivitamin with minerals  1 tablet Oral Daily   omega-3 acid ethyl esters  1 g Oral Daily   pravastatin   40 mg Oral Daily   Continuous Infusions:  PRN Meds: acetaminophen , melatonin, polyethylene glycol, prochlorperazine  Allergies:   No Known Allergies  Social History:   Social History   Socioeconomic History   Marital status: Widowed    Spouse name: Not on file   Number  of children: 4   Years of education: Not on file   Highest education level: Not on file  Occupational History   Occupation: reired  Tobacco Use   Smoking status: Former    Current packs/day: 0.00    Types: Cigarettes    Quit date: 11/04/1960    Years since quitting: 63.8   Smokeless tobacco: Never  Vaping Use   Vaping status: Never Used  Substance and Sexual Activity   Alcohol use: No   Drug use: No   Sexual activity: Not on file  Other Topics Concern   Not on file  Social History Narrative   Not on file   Social Drivers of Health   Financial Resource Strain: Low Risk  (12/01/2023)   Overall Financial Resource Strain (CARDIA)    Difficulty of Paying Living Expenses: Not hard at all  Food Insecurity: No Food Insecurity (06/26/2024)   Hunger Vital Sign    Worried About Running Out of Food in the Last Year: Never true    Ran Out of Food in the Last Year: Never true  Transportation Needs: No Transportation Needs (06/26/2024)   PRAPARE - Administrator, Civil Service (Medical): No    Lack of Transportation (Non-Medical): No  Physical Activity: Sufficiently Active (12/01/2023)   Exercise Vital Sign    Days of Exercise per Week: 3 days    Minutes of Exercise per Session: 120 min  Stress: No Stress Concern Present (12/01/2023)   [employer] of Occupational Health - Occupational Stress Questionnaire    Feeling of Stress : Not at all  Social Connections: Moderately Isolated (06/26/2024)   Social Connection and Isolation Panel    Frequency of Communication with Friends and Family: More than three times a week    Frequency of Social Gatherings with Friends and Family: Twice a week    Attends Religious Services: More than 4 times per year    Active Member of [employer] or Organizations: No    Attends Banker Meetings: Never    Marital Status: Widowed  Intimate Partner Violence: Not At Risk (06/26/2024)   Humiliation, Afraid, Rape, and Kick questionnaire    Fear  of Current or Ex-Partner: No    Emotionally Abused: No    Physically Abused: No    Sexually Abused: No    Family History:    Family History  Problem Relation Age of Onset   Hypertension Mother    Hypertension Father    Allergic rhinitis Neg Hx    Angioedema Neg Hx    Asthma Neg Hx    Eczema Neg Hx    Immunodeficiency Neg Hx    Urticaria Neg Hx      ROS:  Please see the history of present illness.   All other ROS reviewed and negative.     Physical Exam/Data: Vitals:  09/07/24 1000 09/07/24 1051 09/07/24 1100 09/07/24 1200  BP: (!) 130/57  122/60 (!) 115/49  Pulse: 60  78 63  Resp: (!) 21  20 20   Temp:  97.8 F (36.6 C)    TempSrc:  Axillary    SpO2: 96%  97% 97%  Weight:      Height:       No intake or output data in the 24 hours ending 09/07/24 1310    09/06/2024    8:32 PM 09/06/2024    2:23 PM 07/06/2024    1:48 PM  Last 3 Weights  Weight (lbs) 159 lb 159 lb 6.4 oz 157 lb 3.2 oz  Weight (kg) 72.122 kg 72.303 kg 71.305 kg     Body mass index is 23.48 kg/m.  General:  Well nourished, well developed, in no acute distress HEENT: normal Neck: no JVD Vascular: No carotid bruits; Distal pulses 2+ bilaterally Cardiac:  normal S1, S2; RRR; 3/6 harsh systolic murmur RUSB with radiation into carotids  Lungs:  clear to auscultation bilaterally, no wheezing, rhonchi or rales  Abd: soft, nontender, no hepatomegaly  Ext: no edema Musculoskeletal:  No deformities, BUE and BLE strength normal and equal Skin: warm and dry  Neuro: no focal abnormalities noted Psych:  Normal affect   EKG:  The EKG was personally reviewed and demonstrates:  Sinus Bradycardia, 55 bpm Telemetry:  Telemetry was personally reviewed and demonstrates:  Sinus Bradycardia, rates mostly 50s  Relevant CV Studies:  Echo: 04/2024  IMPRESSIONS     1. Left ventricular ejection fraction, by estimation, is 55 to 60%. The  left ventricle has normal function. The left ventricle has no regional  wall  motion abnormalities. There is moderate left ventricular hypertrophy.  Left ventricular diastolic  parameters were normal. The average left ventricular global longitudinal  strain is -15.8 %. The global longitudinal strain is normal.   2. Right ventricular systolic function is normal. The right ventricular  size is normal.   3. Left atrial size was moderately dilated.   4. The mitral valve is degenerative. Mild mitral valve regurgitation. No  evidence of mitral stenosis. Moderate mitral annular calcification.   5. The aortic valve is tricuspid. There is severe calcifcation of the  aortic valve. There is severe thickening of the aortic valve. Aortic valve  regurgitation is mild. Severe aortic valve stenosis.   6. The inferior vena cava is normal in size with greater than 50%  respiratory variability, suggesting right atrial pressure of 3 mmHg.   FINDINGS   Left Ventricle: Left ventricular ejection fraction, by estimation, is 55  to 60%. The left ventricle has normal function. The left ventricle has no  regional wall motion abnormalities. The average left ventricular global  longitudinal strain is -15.8 %.  Strain was performed and the global longitudinal strain is normal. The  left ventricular internal cavity size was normal in size. There is  moderate left ventricular hypertrophy. Left ventricular diastolic  parameters were normal.   Right Ventricle: The right ventricular size is normal. No increase in  right ventricular wall thickness. Right ventricular systolic function is  normal.   Left Atrium: Left atrial size was moderately dilated.   Right Atrium: Right atrial size was normal in size.   Pericardium: There is no evidence of pericardial effusion.   Mitral Valve: The mitral valve is degenerative in appearance. There is  moderate thickening of the mitral valve leaflet(s). There is moderate  calcification of the mitral valve leaflet(s). Moderate mitral annular  calcification.  Mild mitral valve  regurgitation. No evidence of mitral valve stenosis.   Tricuspid Valve: The tricuspid valve is normal in structure. Tricuspid  valve regurgitation is trivial. No evidence of tricuspid stenosis.   Aortic Valve: The aortic valve is tricuspid. There is severe calcifcation  of the aortic valve. There is severe thickening of the aortic valve.  Aortic valve regurgitation is mild. Aortic regurgitation PHT measures 395  msec. Severe aortic stenosis is  present. Aortic valve mean gradient measures 41.0 mmHg. Aortic valve peak  gradient measures 70.6 mmHg. Aortic valve area, by VTI measures 0.78 cm.   Pulmonic Valve: The pulmonic valve was normal in structure. Pulmonic valve  regurgitation is not visualized. No evidence of pulmonic stenosis.   Aorta: The aortic root is normal in size and structure.   Venous: The inferior vena cava is normal in size with greater than 50%  respiratory variability, suggesting right atrial pressure of 3 mmHg.   IAS/Shunts: No atrial level shunt detected by color flow Doppler.   Additional Comments: 3D was performed not requiring image post processing  on an independent workstation and was normal.   Echo: 09/07/2024  IMPRESSIONS     1. Left ventricular ejection fraction, by estimation, is 40 to 45%. Left  ventricular ejection fraction by 2D MOD biplane is 46.1 %. The left  ventricle has mildly decreased function. The left ventricle demonstrates  regional wall motion abnormalities (see   scoring diagram/findings for description). Left ventricular diastolic  parameters are indeterminate.   2. Right ventricular systolic function is normal. The right ventricular  size is normal. There is normal pulmonary artery systolic pressure.   3. Left atrial size was severely dilated.   4. The mitral valve is degenerative. Moderate to severe mitral valve  regurgitation.   5. The aortic valve is calcified. Aortic valve regurgitation is mild.  Severe  aortic valve stenosis. Aortic valve area, by VTI measures 0.86 cm.  Aortic valve mean gradient measures 35.0 mmHg. Aortic valve Vmax measures  3.98 m/s. Aortic valve  acceleration time measures 127 msec.   6. The inferior vena cava is normal in size with greater than 50%  respiratory variability, suggesting right atrial pressure of 3 mmHg.   Comparison(s): Mitral regurgitation has increase. Aortic stenosis is still  severe. Decrease in LVEF.   FINDINGS   Left Ventricle: Left ventricular ejection fraction, by estimation, is 40  to 45%. Left ventricular ejection fraction by 2D MOD biplane is 46.1 %.  The left ventricle has mildly decreased function. The left ventricle  demonstrates regional wall motion  abnormalities. The left ventricular internal cavity size was normal in  size. There is no left ventricular hypertrophy. Left ventricular diastolic  parameters are indeterminate.     LV Wall Scoring:  The mid and distal lateral wall, posterior wall, apical septal segment,  apical anterior segment, and apical inferior segment are hypokinetic.   Right Ventricle: The right ventricular size is normal. No increase in  right ventricular wall thickness. Right ventricular systolic function is  normal. There is normal pulmonary artery systolic pressure. The tricuspid  regurgitant velocity is 2.35 m/s, and   with an assumed right atrial pressure of 3 mmHg, the estimated right  ventricular systolic pressure is 25.1 mmHg.   Left Atrium: Left atrial size was severely dilated.   Right Atrium: Right atrial size was normal in size.   Pericardium: There is no evidence of pericardial effusion.   Mitral Valve: The mitral valve is degenerative in  appearance. Moderate to  severe mitral valve regurgitation. MV peak gradient, 7.4 mmHg. The mean  mitral valve gradient is 3.0 mmHg.   Tricuspid Valve: The tricuspid valve is normal in structure. Tricuspid  valve regurgitation is trivial. No evidence of  tricuspid stenosis.   Aortic Valve: The aortic valve is calcified. Aortic valve regurgitation is  mild. Aortic regurgitation PHT measures 498 msec. Severe aortic stenosis  is present. Aortic valve mean gradient measures 35.0 mmHg. Aortic valve  peak gradient measures 63.4 mmHg.  Aortic valve area, by VTI measures 0.86 cm.   Pulmonic Valve: The pulmonic valve was normal in structure. Pulmonic valve  regurgitation is not visualized. No evidence of pulmonic stenosis.   Aorta: The aortic root and ascending aorta are structurally normal, with  no evidence of dilitation.   Venous: The inferior vena cava is normal in size with greater than 50%  respiratory variability, suggesting right atrial pressure of 3 mmHg.   IAS/Shunts: No atrial level shunt detected by color flow Doppler.    Laboratory Data: High Sensitivity Troponin:   Recent Labs  Lab 09/06/24 2019 09/06/24 2326 09/07/24 0133  TROPONINIHS 36* 38* 37*     Chemistry Recent Labs  Lab 09/06/24 1530 09/06/24 2019 09/07/24 0133  NA 134* 134* 134*  K 4.7 4.6 4.0  CL 102 102 104  CO2 23 21* 19*  GLUCOSE 106* 112* 180*  BUN 23 22 23   CREATININE 1.57* 1.57* 1.60*  CALCIUM 9.8 9.4 9.1  MG  --   --  2.0  GFRNONAA  --  41* 40*  ANIONGAP  --  11 11    Recent Labs  Lab 09/06/24 2019  PROT 6.4*  ALBUMIN 3.8  AST 19  ALT 18  ALKPHOS 64  BILITOT 1.1   Lipids No results for input(s): CHOL, TRIG, HDL, LABVLDL, LDLCALC, CHOLHDL in the last 168 hours.  Hematology Recent Labs  Lab 09/06/24 1530 09/06/24 2019 09/07/24 0133  WBC 8.1 8.8 8.6  RBC 4.60 4.33 4.32  HGB 14.7 13.8 13.7  HCT 42.8 40.9 40.2  MCV 93.2 94.5 93.1  MCH  --  31.9 31.7  MCHC 34.2 33.7 34.1  RDW 13.1 12.6 12.7  PLT 267.0 273 266   Thyroid  No results for input(s): TSH, FREET4 in the last 168 hours.  BNP Recent Labs  Lab 09/06/24 1530 09/06/24 2019  BNP  --  1,123.1*  PROBNP 6,771*  --     DDimer No results for input(s):  DDIMER in the last 168 hours.  Radiology/Studies:  DG Chest 2 View Result Date: 09/06/2024 EXAM: 2 VIEW(S) XRAY OF THE CHEST 09/06/2024 03:51:25 PM COMPARISON: 06/25/2024 CLINICAL HISTORY: Shortness of breath with exertion. FINDINGS: LUNGS AND PLEURA: Moderate left and small right pleural effusions with bibasilar atelectasis, new from previous exam. Increased interstitial markings compatible with mild edema. No pneumothorax. HEART AND MEDIASTINUM: Aortic atherosclerosis. No acute abnormality of the cardiac and mediastinal silhouettes. BONES AND SOFT TISSUES: Polyarticular degenerative change. No acute osseous abnormality. IMPRESSION: 1. Moderate left and small right pleural effusions with bibasilar atelectasis, new from prior exam. 2. Mild interstitial pulmonary edema. Electronically signed by: Waddell Calk MD 09/06/2024 04:32 PM EST RP Workstation: [id number]     Assessment and Plan:  Nikolas Casher is a 89 y.o. male with a hx of CAD s/p stenting to RCA, DM, HTN, HLD, hypothyroidism and severe aortic stenosis who is being seen 09/07/2024 for the evaluation of CHF/AS at the request of Dr. Fairy.  Acute systolic HF  Severe aortic stenosis Moderate to severe MR -- Echo 04/2024 LVEF 55-60%, moderate LVH, mild MR, severe aortic stenosis with mean gradient , AVA 0.78 cm2, DI 0.23. Evaluated by Dr. Verlin and felt to be a good candidate for TAVR but was not symptomatic at that time -- presented with worsening dyspnea, orthopnea and PND over the past several weeks -- CXR with bilateral pleural effusions -- BNP 1123 -- received IV lasix 20mg  and 40mg  with good UOP, breathing much improved -- echo today with LVEF of 40-45%, normal RV, m/d lateral wall, posterior and apical septal hypokinesis, moderate to severe MR, severe aortic stenosis with mean gradient  -- discussed proceeding with TAVR workup which he is agreeable to -- plan for possible [facility] tomorrow pending stable Cr    Informed Consent   Shared Decision Making/Informed Consent{ The risks [stroke (1 in 1000), death (1 in 1000), kidney failure [usually temporary] (1 in 500), bleeding (1 in 200), allergic reaction [possibly serious] (1 in 200)], benefits (diagnostic support and management of coronary artery disease) and alternatives of a cardiac catheterization were discussed in detail with [name] and he is willing to proceed.     CAD s/p stenting to RCA ' 12 -- denies any angina -- on plavix , statin -- as above, plan R/LHC  HTN -- controlled  -- on amlodipine  5mg  and losartan  50mg  PTA, held currently  HLD -- on pravastatin  -- check lipids    Risk Assessment/Risk Scores:  New [place]  Heart Association (NYHA) Functional Class NYHA Class III  For questions or updates, please contact [facility] Please consult [email or web address] for contact info under     Signed, Manuelita Rummer, NP  09/07/2024 1:10 PM  Patient seen, examined. Available data reviewed. Agree with findings, assessment, and plan as outlined by Manuelita Rummer, NP.  The patient is alert, oriented, in no distress.  He appears younger than his stated age.  HEENT is normal, JVP is normal, carotid upstrokes are delayed with bilateral bruits, lungs are clear bilaterally, heart is regular rate and rhythm with distant heart sounds and a 2/6 harsh crescendo decrescendo murmur at the right upper sternal border, abdomen is soft, thin, and nontender with no masses, extremities have no edema, skin is warm and dry with no rash.  The patient's echocardiogram is reviewed and demonstrates reduced LV function with LVEF 40 to 45%, normal RV function, moderate to severe MR, mild AR, and severe aortic stenosis with a calculated valve area of 0.86 cm, mean gradient 35 mmHg, peak transaortic velocity of 4 m/s.  Since the patient's last echo study, his mitral regurgitation has worsened and his LVEF has worsened.  The patient reports progressive symptoms  of exertional dyspnea and orthopnea as well as exercise intolerance, all consistent with worsening aortic stenosis and heart failure.  He underwent TAVR consult by Dr. Verlin in July of this year, at which time he denied any symptoms.  Close follow-up was recommended, but in the interim the patient has developed symptomatic aortic stenosis associated with congestive heart failure.  He has a strong indication for aortic valve replacement and would be appropriate for TAVR at age 26.  He has improved significantly with IV diuresis here in the hospital over the past 24 hours.  We discussed TAVR evaluation and this would include cardiac catheterization as well as CTA studies.  Will tentatively plan for right and left heart catheterization and possible PCI tomorrow. I have reviewed the risks, indications, and alternatives to cardiac catheterization, possible angioplasty,  and stenting with the patient. Risks include but are not limited to bleeding, infection, vascular injury, stroke, myocardial infection, arrhythmia, kidney injury, radiation-related injury in the case of prolonged fluoroscopy use, emergency cardiac surgery, and death. The patient understands the risks of serious complication is 1-2 in 1000 with diagnostic cardiac cath and 1-2% or less with angioplasty/stenting.  With his chronic kidney disease and creatinine of 1.6, will reassess renal function tomorrow before cardiac catheterization.  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  He is widowed but appears to be remarkably active for his age.  [name], M.D. 09/07/2024 4:20 PM

## 2024-09-07 NOTE — Progress Notes (Signed)
  Echocardiogram 2D Echocardiogram has been performed.  Norleen ORN Daryan Cagley 09/07/2024, 1:38 PM

## 2024-09-07 NOTE — Plan of Care (Signed)
  Problem: Clinical Measurements: Goal: Ability to maintain clinical measurements within normal limits will improve Outcome: Progressing   Problem: Clinical Measurements: Goal: Will remain free from infection Outcome: Progressing   Problem: Clinical Measurements: Goal: Will remain free from infection Outcome: Progressing   Problem: Clinical Measurements: Goal: Diagnostic test results will improve Outcome: Progressing   Problem: Clinical Measurements: Goal: Respiratory complications will improve Outcome: Progressing   Problem: Clinical Measurements: Goal: Cardiovascular complication will be avoided Outcome: Progressing   Problem: Activity: Goal: Risk for activity intolerance will decrease Outcome: Progressing

## 2024-09-07 NOTE — Progress Notes (Signed)
 PROGRESS NOTE    [name]  FMW:[id number] DOB: [birth year] DOA: 09/06/2024 PCP: [name], NP  89/M with history of CAD, severe aortic stenosis, hypertension, dyslipidemia, hypothyroidism, CKD 3B presented to the ED with worsening dyspnea on exertion and orthopnea for 4 to 5 days. - In the ER he was tachypneic, BNP greater than 1100, chest x-ray with moderate left and small right pleural effusion, interstitial edema, troponin of 36.  Admitted, started on diuretics   Subjective: -Feels better, breathing has improved considerably  Assessment and Plan:  Acute on chronic diastolic CHF Severe aortic stenosis - Last echo 6/25 noted EF 55-60%, normal RV, severe aortic stenosis - Improving with diuresis, continue IV Lasix 1 more day - Hold off on resuming losartan  this morning - Will request cardiology input, may need workup for TAVR  Elevated troponin, demand ischemia - Due to CHF, no ACS  History of CAD Stable, continue Plavix  and statin  History of CKD 3B - Stable, monitor  Hypothyroidism Continue Synthroid     DVT prophylaxis: Lovenox Code Status: Full code Family Communication: None present Disposition Plan: Home pending above workup  Consultants:    Procedures:   Antimicrobials:    Objective: Vitals:   09/07/24 0615 09/07/24 0645 09/07/24 0700 09/07/24 0800  BP: (!) 119/52 120/64 (!) 116/58 (!) 114/50  Pulse: 64 75 68 66  Resp: 17 16 20  (!) 21  Temp:      SpO2: 94% 95% 97% 96%  Weight:      Height:       No intake or output data in the 24 hours ending 09/07/24 1015 Filed Weights   09/06/24 2032  Weight: 72.1 kg    Examination:  General exam: Appears calm and comfortable  Respiratory system: Decreased at the bases Cardiovascular system: S1 & S2 heard, RRR.  Systolic murmur Abd: nondistended, soft and nontender.Normal bowel sounds heard. Central nervous system: Alert and oriented. No focal neurological deficits. Extremities: Trace  edema Skin: No rashes Psychiatry:  Mood & affect appropriate.     Data Reviewed:   CBC: Recent Labs  Lab 09/06/24 1530 09/06/24 2019 09/07/24 0133  WBC 8.1 8.8 8.6  NEUTROABS 5.6 5.5  --   HGB 14.7 13.8 13.7  HCT 42.8 40.9 40.2  MCV 93.2 94.5 93.1  PLT 267.0 273 266   Basic Metabolic Panel: Recent Labs  Lab 09/06/24 1530 09/06/24 2019 09/07/24 0133  NA 134* 134* 134*  K 4.7 4.6 4.0  CL 102 102 104  CO2 23 21* 19*  GLUCOSE 106* 112* 180*  BUN 23 22 23   CREATININE 1.57* 1.57* 1.60*  CALCIUM 9.8 9.4 9.1  MG  --   --  2.0  PHOS  --   --  3.0   GFR: Estimated Creatinine Clearance: 30.1 mL/min (A) (by C-G formula based on SCr of 1.6 mg/dL (H)). Liver Function Tests: Recent Labs  Lab 09/06/24 2019  AST 19  ALT 18  ALKPHOS 64  BILITOT 1.1  PROT 6.4*  ALBUMIN 3.8   No results for input(s): LIPASE, AMYLASE in the last 168 hours. No results for input(s): AMMONIA in the last 168 hours. Coagulation Profile: No results for input(s): INR, PROTIME in the last 168 hours. Cardiac Enzymes: No results for input(s): CKTOTAL, CKMB, CKMBINDEX, TROPONINI in the last 168 hours. BNP (last 3 results) Recent Labs    09/06/24 1530  PROBNP 6,771*   HbA1C: No results for input(s): HGBA1C in the last 72 hours. CBG: No results for input(s):  GLUCAP in the last 168 hours. Lipid Profile: No results for input(s): CHOL, HDL, LDLCALC, TRIG, CHOLHDL, LDLDIRECT in the last 72 hours. Thyroid  Function Tests: No results for input(s): TSH, T4TOTAL, FREET4, T3FREE, THYROIDAB in the last 72 hours. Anemia Panel: No results for input(s): VITAMINB12, FOLATE, FERRITIN, TIBC, IRON, RETICCTPCT in the last 72 hours. Urine analysis:    Component Value Date/Time   COLORURINE YELLOW 06/26/2024 0233   APPEARANCEUR CLEAR 06/26/2024 0233   LABSPEC 1.018 06/26/2024 0233   PHURINE 5.0 06/26/2024 0233   GLUCOSEU NEGATIVE 06/26/2024 0233    HGBUR NEGATIVE 06/26/2024 0233   BILIRUBINUR NEGATIVE 06/26/2024 0233   KETONESUR NEGATIVE 06/26/2024 0233   PROTEINUR NEGATIVE 06/26/2024 0233   NITRITE NEGATIVE 06/26/2024 0233   LEUKOCYTESUR NEGATIVE 06/26/2024 0233   Sepsis Labs: @LABRCNTIP (procalcitonin:4,lacticidven:4)  )No results found for this or any previous visit (from the past 240 hours).   Radiology Studies: DG Chest 2 View Result Date: 09/06/2024 EXAM: 2 VIEW(S) XRAY OF THE CHEST 09/06/2024 03:51:25 PM COMPARISON: 06/25/2024 CLINICAL HISTORY: Shortness of breath with exertion. FINDINGS: LUNGS AND PLEURA: Moderate left and small right pleural effusions with bibasilar atelectasis, new from previous exam. Increased interstitial markings compatible with mild edema. No pneumothorax. HEART AND MEDIASTINUM: Aortic atherosclerosis. No acute abnormality of the cardiac and mediastinal silhouettes. BONES AND SOFT TISSUES: Polyarticular degenerative change. No acute osseous abnormality. IMPRESSION: 1. Moderate left and small right pleural effusions with bibasilar atelectasis, new from prior exam. 2. Mild interstitial pulmonary edema. Electronically signed by: Waddell Calk MD 09/06/2024 04:32 PM EST RP Workstation: [id number]     Scheduled Meds:  clopidogrel   75 mg Oral Daily   enoxaparin (LOVENOX) injection  40 mg Subcutaneous Daily   furosemide  40 mg Intravenous BID   levothyroxine   88 mcg Oral Q0600   multivitamin with minerals  1 tablet Oral Daily   omega-3 acid ethyl esters  1 g Oral Daily   pravastatin   40 mg Oral Daily   Continuous Infusions:   LOS: 1 day    Time spent:    Sigurd Pac, MD [facility]   09/07/2024, 10:15 AM

## 2024-09-08 ENCOUNTER — Ambulatory Visit: Payer: Self-pay | Admitting: Internal Medicine

## 2024-09-08 DIAGNOSIS — I5021 Acute systolic (congestive) heart failure: Secondary | ICD-10-CM | POA: Diagnosis not present

## 2024-09-08 DIAGNOSIS — I10 Essential (primary) hypertension: Secondary | ICD-10-CM | POA: Diagnosis not present

## 2024-09-08 DIAGNOSIS — E785 Hyperlipidemia, unspecified: Secondary | ICD-10-CM | POA: Diagnosis not present

## 2024-09-08 DIAGNOSIS — I5033 Acute on chronic diastolic (congestive) heart failure: Secondary | ICD-10-CM | POA: Diagnosis not present

## 2024-09-08 DIAGNOSIS — I2510 Atherosclerotic heart disease of native coronary artery without angina pectoris: Secondary | ICD-10-CM | POA: Diagnosis not present

## 2024-09-08 LAB — BASIC METABOLIC PANEL WITH GFR
Anion gap: 16 — ABNORMAL HIGH (ref 5–15)
BUN: 28 mg/dL — ABNORMAL HIGH (ref 8–23)
CO2: 21 mmol/L — ABNORMAL LOW (ref 22–32)
Calcium: 9.6 mg/dL (ref 8.9–10.3)
Chloride: 99 mmol/L (ref 98–111)
Creatinine, Ser: 1.94 mg/dL — ABNORMAL HIGH (ref 0.61–1.24)
GFR, Estimated: 32 mL/min — ABNORMAL LOW (ref >60)
Glucose, Bld: 115 mg/dL — ABNORMAL HIGH (ref 70–99)
Potassium: 4.3 mmol/L (ref 3.5–5.1)
Sodium: 136 mmol/L (ref 135–145)

## 2024-09-08 LAB — LIPID PANEL
Cholesterol: 134 mg/dL (ref 0–200)
HDL: 39 mg/dL — ABNORMAL LOW (ref >40)
LDL Cholesterol: 76 mg/dL (ref 0–99)
Total CHOL/HDL Ratio: 3.4 ratio
Triglycerides: 93 mg/dL (ref <150)
VLDL: 19 mg/dL (ref 0–40)

## 2024-09-08 MED ORDER — SODIUM CHLORIDE 0.9 % IV SOLN: INTRAVENOUS | Status: DC

## 2024-09-08 MED ORDER — ASPIRIN 81 MG PO CHEW
81.0000 mg | CHEWABLE_TABLET | Freq: Once | ORAL | Status: AC
  Administered 2024-09-08: 81 mg via ORAL
  Filled 2024-09-08: qty 1

## 2024-09-08 MED ORDER — ENOXAPARIN SODIUM 30 MG/0.3ML IJ SOSY
30.0000 mg | PREFILLED_SYRINGE | Freq: Every day | INTRAMUSCULAR | Status: DC
  Administered 2024-09-08 – 2024-09-09 (×2): 30 mg via SUBCUTANEOUS
  Filled 2024-09-08 (×2): qty 0.3

## 2024-09-08 NOTE — TOC CM/SW Note (Signed)
 Transition of Care [facility]) - Inpatient Brief Assessment   Patient Details  Name: [name] MRN: [id number] Date of Birth: June 16, 1930  [facility] CM/SW Contact:    Waddell Barnie Rama, RN Phone Number: 09/08/2024, 10:10 AM   Clinical Narrative: From home alone, indep, has PCP and insurance on file, states has no HH services in place at this time or DME at home.  States family member  (daughter) will transport them home at [employer] and family is support system, states gets medications from [facility] on [facility].  Pta self ambulatory.   There are no ICM  needs identified  at this time.  Please place consult for ICM needs.     Transition of Care Asessment: Insurance and Status: Insurance coverage has been reviewed Patient has primary care physician: Yes Home environment has been reviewed: home alone Prior level of function:: indep Prior/Current Home Services: No current home services Social Drivers of Health Review: SDOH reviewed no interventions necessary Readmission risk has been reviewed: Yes Transition of care needs: no transition of care needs at this time

## 2024-09-08 NOTE — Plan of Care (Signed)

## 2024-09-08 NOTE — Progress Notes (Addendum)
 Progress Note  Patient Name: [name] Date of Encounter: 09/08/2024 [facility] Cardiologist: Lonni Cash, MD   Interval Summary    No complaints this morning, feels well.   Vital Signs Vitals:   09/07/24 1916 09/08/24 0348 09/08/24 0718 09/08/24 0721  BP: (!) 116/52 (!) 105/50 (!) 105/52   Pulse: 75 67 60   Resp: 20 19 15    Temp: (!) 97.5 F (36.4 C) 98.3 F (36.8 C) (!) 97.4 F (36.3 C)   TempSrc: Oral Oral Oral Oral  SpO2: 100% 93% 96%   Weight:  67.1 kg    Height:        Intake/Output Summary (Last 24 hours) at 09/08/2024 1105 Last data filed at 09/08/2024 0347 Gross per 24 hour  Intake 240 ml  Output 1650 ml  Net -1410 ml      09/08/2024    3:48 AM 09/07/2024    3:41 PM 09/06/2024    8:32 PM  Last 3 Weights  Weight (lbs) 147 lb 14.4 oz 150 lb 5.7 oz 159 lb  Weight (kg) 67.087 kg 68.2 kg 72.122 kg      Telemetry/ECG   Sinus Bradycardia, 50s - Personally Reviewed  Physical Exam  GEN: No acute distress.   Neck: No JVD Cardiac: Bradycardia, 3/6 harsh systolic murmur, no rubs, or gallops.  Respiratory: Clear to auscultation bilaterally. GI: Soft, nontender, non-distended  MS: No edema  Assessment & Plan   89 y.o. male with a hx of CAD s/p stenting to RCA, DM, HTN, HLD, hypothyroidism and severe aortic stenosis who is being seen 09/07/2024 for the evaluation of CHF/AS at the request of Dr. Fairy.   Acute systolic HF Severe aortic stenosis Moderate to severe MR -- Echo 04/2024 LVEF 55-60%, moderate LVH, mild MR, severe aortic stenosis with mean gradient , AVA 0.78 cm2, DI 0.23. Evaluated by Dr. Cash and felt to be a good candidate for TAVR but was not symptomatic at that time -- presented with worsening dyspnea, orthopnea and PND over the past several weeks -- CXR with bilateral pleural effusions -- BNP 1123 -- received IV lasix 20mg  and 40mg  with good UOP, breathing much improved. Lasix held now  -- echo 11/4 with  LVEF of 40-45%, normal RV, m/d lateral wall, posterior and apical septal hypokinesis, moderate to severe MR, severe aortic stenosis with mean gradient  -- needs R/LHC, initially plan for today but Cr up to 1.9. Defer until tomorrow pending Cr   AKI on CKD 3b -- Cr 1.56>>1.60>>1.94 -- lasix held  CAD s/p stenting to RCA ' 12 -- denies any angina -- on plavix , statin -- as above, plan R/LHC   HTN -- controlled  -- on amlodipine  5mg  and losartan  50mg  PTA, held currently   HLD -- on pravastatin , lovaza  1g -- LDL 76, HDL 39  For questions or updates, please contact [facility] Please consult [email or web address] for contact info under   Signed, Manuelita Rummer, NP   Patient seen, examined. Available data reviewed. Agree with findings, assessment, and plan as outlined by Manuelita Rummer, NP.  The patient is alert, oriented, in no distress.  HEENT normal, JVP is normal, lungs clear bilaterally, heart is regular rate and rhythm with a 3/6 harsh late peaking systolic crescendo decrescendo murmur at the right upper sternal border, abdomen is soft, thin, nontender, extremities have no edema, skin is warm and dry with no rash.  Unfortunately the patient's creatinine is more elevated today, indicative of AKI on background  of CKD 3B likely related to his diuresis.  He needs to undergo right/left heart catheterization for TAVR workup.  Will hold diuretics and defer until tomorrow.  Holding losartan  at present.  Repeat labs tomorrow and plan to proceed with diagnostic cardiac catheterization tomorrow as long as his creatinine is stable or improving.  Otherwise, as outlined above.    [name], M.D. 09/08/2024 2:10 PM

## 2024-09-08 NOTE — H&P (View-Only) (Signed)
 Progress Note  Patient Name: [name] Date of Encounter: 09/08/2024 [facility] Cardiologist: Lonni Cash, MD   Interval Summary    No complaints this morning, feels well.   Vital Signs Vitals:   09/07/24 1916 09/08/24 0348 09/08/24 0718 09/08/24 0721  BP: (!) 116/52 (!) 105/50 (!) 105/52   Pulse: 75 67 60   Resp: 20 19 15    Temp: (!) 97.5 F (36.4 C) 98.3 F (36.8 C) (!) 97.4 F (36.3 C)   TempSrc: Oral Oral Oral Oral  SpO2: 100% 93% 96%   Weight:  67.1 kg    Height:        Intake/Output Summary (Last 24 hours) at 09/08/2024 1105 Last data filed at 09/08/2024 0347 Gross per 24 hour  Intake 240 ml  Output 1650 ml  Net -1410 ml      09/08/2024    3:48 AM 09/07/2024    3:41 PM 09/06/2024    8:32 PM  Last 3 Weights  Weight (lbs) 147 lb 14.4 oz 150 lb 5.7 oz 159 lb  Weight (kg) 67.087 kg 68.2 kg 72.122 kg      Telemetry/ECG   Sinus Bradycardia, 50s - Personally Reviewed  Physical Exam  GEN: No acute distress.   Neck: No JVD Cardiac: Bradycardia, 3/6 harsh systolic murmur, no rubs, or gallops.  Respiratory: Clear to auscultation bilaterally. GI: Soft, nontender, non-distended  MS: No edema  Assessment & Plan   89 y.o. male with a hx of CAD s/p stenting to RCA, DM, HTN, HLD, hypothyroidism and severe aortic stenosis who is being seen 09/07/2024 for the evaluation of CHF/AS at the request of Dr. Fairy.   Acute systolic HF Severe aortic stenosis Moderate to severe MR -- Echo 04/2024 LVEF 55-60%, moderate LVH, mild MR, severe aortic stenosis with mean gradient , AVA 0.78 cm2, DI 0.23. Evaluated by Dr. Cash and felt to be a good candidate for TAVR but was not symptomatic at that time -- presented with worsening dyspnea, orthopnea and PND over the past several weeks -- CXR with bilateral pleural effusions -- BNP 1123 -- received IV lasix 20mg  and 40mg  with good UOP, breathing much improved. Lasix held now  -- echo 11/4 with  LVEF of 40-45%, normal RV, m/d lateral wall, posterior and apical septal hypokinesis, moderate to severe MR, severe aortic stenosis with mean gradient  -- needs R/LHC, initially plan for today but Cr up to 1.9. Defer until tomorrow pending Cr   AKI on CKD 3b -- Cr 1.56>>1.60>>1.94 -- lasix held  CAD s/p stenting to RCA ' 12 -- denies any angina -- on plavix , statin -- as above, plan R/LHC   HTN -- controlled  -- on amlodipine  5mg  and losartan  50mg  PTA, held currently   HLD -- on pravastatin , lovaza  1g -- LDL 76, HDL 39  For questions or updates, please contact [facility] Please consult [email or web address] for contact info under   Signed, Manuelita Rummer, NP   Patient seen, examined. Available data reviewed. Agree with findings, assessment, and plan as outlined by Manuelita Rummer, NP.  The patient is alert, oriented, in no distress.  HEENT normal, JVP is normal, lungs clear bilaterally, heart is regular rate and rhythm with a 3/6 harsh late peaking systolic crescendo decrescendo murmur at the right upper sternal border, abdomen is soft, thin, nontender, extremities have no edema, skin is warm and dry with no rash.  Unfortunately the patient's creatinine is more elevated today, indicative of AKI on background  of CKD 3B likely related to his diuresis.  He needs to undergo right/left heart catheterization for TAVR workup.  Will hold diuretics and defer until tomorrow.  Holding losartan  at present.  Repeat labs tomorrow and plan to proceed with diagnostic cardiac catheterization tomorrow as long as his creatinine is stable or improving.  Otherwise, as outlined above.    [name], M.D. 09/08/2024 2:10 PM

## 2024-09-08 NOTE — Progress Notes (Signed)
 PROGRESS NOTE    [name]  FMW:[id number] DOB: October 28, 1930 DOA: 09/06/2024 PCP: [name], NP  89/M with history of CAD, severe aortic stenosis, hypertension, dyslipidemia, hypothyroidism, CKD 3B presented to the ED with worsening dyspnea on exertion and orthopnea for 4 to 5 days. - In the ER he was tachypneic, BNP greater than 1100, chest x-ray with moderate left and small right pleural effusion, interstitial edema, troponin of 36.  Admitted, started on diuretics   Subjective: -Feels better, breathing has improved considerably  Assessment and Plan:  Acute on chronic diastolic CHF Severe aortic stenosis - Last echo 6/25 noted EF 55-60%, normal RV, severe aortic stenosis - Repeat echo yesterday noted EF of 40-45% with wall motion abnormality, normal RV, moderate to severe MR and severe aortic stenosis with mean gradient of 35 mmHg - Volume status improving, creatinine trending up, hold further IV Lasix - Cards consulting, plan for TAVR workup, R and LHC today  Elevated troponin, demand ischemia - Due to CHF, no ACS  History of CAD -Concern for ischemic cardiomyopathy considering wall motion abnormalities, continue Plavix  and statin - LHC today  History of CKD 3B - Stable, monitor  Hypothyroidism Continue Synthroid   DVT prophylaxis: Lovenox Code Status: Full code Family Communication: None present Disposition Plan: Home pending above workup  Consultants:    Procedures:   Antimicrobials:    Objective: Vitals:   09/07/24 1916 09/08/24 0348 09/08/24 0718 09/08/24 0721  BP: (!) 116/52 (!) 105/50 (!) 105/52   Pulse: 75 67 60   Resp: 20 19 15    Temp: (!) 97.5 F (36.4 C) 98.3 F (36.8 C) (!) 97.4 F (36.3 C)   TempSrc: Oral Oral Oral Oral  SpO2: 100% 93% 96%   Weight:  67.1 kg    Height:        Intake/Output Summary (Last 24 hours) at 09/08/2024 1103 Last data filed at 09/08/2024 0347 Gross per 24 hour  Intake 240 ml  Output 1650 ml  Net  -1410 ml   Filed Weights   09/06/24 2032 09/07/24 1541 09/08/24 0348  Weight: 72.1 kg 68.2 kg 67.1 kg    Examination:  General exam: Appears calm and comfortable  Respiratory system: Decreased at the bases Cardiovascular system: S1 & S2 heard, RRR.  Systolic murmur Abd: nondistended, soft and nontender.Normal bowel sounds heard. Central nervous system: Alert and oriented. No focal neurological deficits. Extremities: Trace edema Skin: No rashes Psychiatry:  Mood & affect appropriate.     Data Reviewed:   CBC: Recent Labs  Lab 09/06/24 1530 09/06/24 2019 09/07/24 0133  WBC 8.1 8.8 8.6  NEUTROABS 5.6 5.5  --   HGB 14.7 13.8 13.7  HCT 42.8 40.9 40.2  MCV 93.2 94.5 93.1  PLT 267.0 273 266   Basic Metabolic Panel: Recent Labs  Lab 09/06/24 1530 09/06/24 2019 09/07/24 0133 09/08/24 0323  NA 134* 134* 134* 136  K 4.7 4.6 4.0 4.3  CL 102 102 104 99  CO2 23 21* 19* 21*  GLUCOSE 106* 112* 180* 115*  BUN 23 22 23  28*  CREATININE 1.57* 1.57* 1.60* 1.94*  CALCIUM 9.8 9.4 9.1 9.6  MG  --   --  2.0  --   PHOS  --   --  3.0  --    GFR: Estimated Creatinine Clearance: 23.5 mL/min (A) (by C-G formula based on SCr of 1.94 mg/dL (H)). Liver Function Tests: Recent Labs  Lab 09/06/24 2019  AST 19  ALT 18  ALKPHOS 64  BILITOT 1.1  PROT 6.4*  ALBUMIN 3.8   No results for input(s): LIPASE, AMYLASE in the last 168 hours. No results for input(s): AMMONIA in the last 168 hours. Coagulation Profile: No results for input(s): INR, PROTIME in the last 168 hours. Cardiac Enzymes: No results for input(s): CKTOTAL, CKMB, CKMBINDEX, TROPONINI in the last 168 hours. BNP (last 3 results) Recent Labs    09/06/24 1530  PROBNP 6,771*   HbA1C: No results for input(s): HGBA1C in the last 72 hours. CBG: No results for input(s): GLUCAP in the last 168 hours. Lipid Profile: Recent Labs    09/08/24 0323  CHOL 134  HDL 39*  LDLCALC 76  TRIG 93  CHOLHDL  3.4   Thyroid  Function Tests: No results for input(s): TSH, T4TOTAL, FREET4, T3FREE, THYROIDAB in the last 72 hours. Anemia Panel: No results for input(s): VITAMINB12, FOLATE, FERRITIN, TIBC, IRON, RETICCTPCT in the last 72 hours. Urine analysis:    Component Value Date/Time   COLORURINE YELLOW 06/26/2024 0233   APPEARANCEUR CLEAR 06/26/2024 0233   LABSPEC 1.018 06/26/2024 0233   PHURINE 5.0 06/26/2024 0233   GLUCOSEU NEGATIVE 06/26/2024 0233   HGBUR NEGATIVE 06/26/2024 0233   BILIRUBINUR NEGATIVE 06/26/2024 0233   KETONESUR NEGATIVE 06/26/2024 0233   PROTEINUR NEGATIVE 06/26/2024 0233   NITRITE NEGATIVE 06/26/2024 0233   LEUKOCYTESUR NEGATIVE 06/26/2024 0233   Sepsis Labs: @LABRCNTIP (procalcitonin:4,lacticidven:4)  )No results found for this or any previous visit (from the past 240 hours).   Radiology Studies: ECHOCARDIOGRAM COMPLETE Result Date: 09/07/2024    ECHOCARDIOGRAM REPORT   Patient Name:   [name] [facility] Date of Exam: 09/07/2024 Medical Rec #:  [id number]          Height:       69.0 in Accession #:    [id number]         Weight:       159.0 lb Date of Birth:  December 07, 1929         BSA:          1.874 m Patient Age:    89 years           BP:           120/84 mmHg Patient Gender: M                  HR:           75 bpm. Exam Location:  Inpatient Procedure: 2D Echo (Both Spectral and Color Flow Doppler were utilized during            procedure). Indications:    CHF  History:        Patient has prior history of Echocardiogram examinations. CHF;                 Aortic Valve Disease and Mitral Valve Disease.  Sonographer:    Norleen Amour Referring Phys: [id number] CAROLE N HALL IMPRESSIONS  1. Left ventricular ejection fraction, by estimation, is 40 to 45%. Left ventricular ejection fraction by 2D MOD biplane is 46.1 %. The left ventricle has mildly decreased function. The left ventricle demonstrates regional wall motion abnormalities (see  scoring diagram/findings  for description). Left ventricular diastolic parameters are indeterminate.  2. Right ventricular systolic function is normal. The right ventricular size is normal. There is normal pulmonary artery systolic pressure.  3. Left atrial size was severely dilated.  4. The mitral valve is degenerative. Moderate to severe mitral valve regurgitation.  5. The aortic valve is calcified.  Aortic valve regurgitation is mild. Severe aortic valve stenosis. Aortic valve area, by VTI measures 0.86 cm. Aortic valve mean gradient measures 35.0 mmHg. Aortic valve Vmax measures 3.98 m/s. Aortic valve acceleration time measures 127 msec.  6. The inferior vena cava is normal in size with greater than 50% respiratory variability, suggesting right atrial pressure of 3 mmHg. Comparison(s): Mitral regurgitation has increase. Aortic stenosis is still severe. Decrease in LVEF. FINDINGS  Left Ventricle: Left ventricular ejection fraction, by estimation, is 40 to 45%. Left ventricular ejection fraction by 2D MOD biplane is 46.1 %. The left ventricle has mildly decreased function. The left ventricle demonstrates regional wall motion abnormalities. The left ventricular internal cavity size was normal in size. There is no left ventricular hypertrophy. Left ventricular diastolic parameters are indeterminate.  LV Wall Scoring: The mid and distal lateral wall, posterior wall, apical septal segment, apical anterior segment, and apical inferior segment are hypokinetic. Right Ventricle: The right ventricular size is normal. No increase in right ventricular wall thickness. Right ventricular systolic function is normal. There is normal pulmonary artery systolic pressure. The tricuspid regurgitant velocity is 2.35 m/s, and  with an assumed right atrial pressure of 3 mmHg, the estimated right ventricular systolic pressure is 25.1 mmHg. Left Atrium: Left atrial size was severely dilated. Right Atrium: Right atrial size was normal in size. Pericardium: There is  no evidence of pericardial effusion. Mitral Valve: The mitral valve is degenerative in appearance. Moderate to severe mitral valve regurgitation. MV peak gradient, 7.4 mmHg. The mean mitral valve gradient is 3.0 mmHg. Tricuspid Valve: The tricuspid valve is normal in structure. Tricuspid valve regurgitation is trivial. No evidence of tricuspid stenosis. Aortic Valve: The aortic valve is calcified. Aortic valve regurgitation is mild. Aortic regurgitation PHT measures 498 msec. Severe aortic stenosis is present. Aortic valve mean gradient measures 35.0 mmHg. Aortic valve peak gradient measures 63.4 mmHg. Aortic valve area, by VTI measures 0.86 cm. Pulmonic Valve: The pulmonic valve was normal in structure. Pulmonic valve regurgitation is not visualized. No evidence of pulmonic stenosis. Aorta: The aortic root and ascending aorta are structurally normal, with no evidence of dilitation. Venous: The inferior vena cava is normal in size with greater than 50% respiratory variability, suggesting right atrial pressure of 3 mmHg. IAS/Shunts: No atrial level shunt detected by color flow Doppler.  LEFT VENTRICLE PLAX 2D                        Biplane EF (MOD) LVIDd:         5.80 cm         LV Biplane EF:   Left LVIDs:         4.60 cm                          ventricular LV PW:         0.80 cm                          ejection LV IVS:        0.90 cm                          fraction by LVOT diam:     2.00 cm  2D MOD LV SV:         72                               biplane is LV SV Index:   39                               46.1 %. LVOT Area:     3.14 cm                                Diastology                                LV e' medial:    5.61 cm/s LV Volumes (MOD)               LV E/e' medial:  27.5 LV vol d, MOD    170.0 ml      LV e' lateral:   5.70 cm/s A2C:                           LV E/e' lateral: 27.0 LV vol d, MOD    168.0 ml A4C: LV vol s, MOD    90.9 ml A2C: LV vol s, MOD    79.1 ml A4C: LV  SV MOD A2C:   79.1 ml LV SV MOD A4C:   168.0 ml LV SV MOD BP:    78.0 ml RIGHT VENTRICLE             IVC RV Basal diam:  3.10 cm     IVC diam: 1.80 cm RV S prime:     11.40 cm/s TAPSE (M-mode): 2.1 cm      PULMONARY VEINS                             Diastolic Velocity: 87.10 cm/s                             S/D Velocity:       0.30                             Systolic Velocity:  22.70 cm/s LEFT ATRIUM              Index        RIGHT ATRIUM           Index LA diam:        3.60 cm  1.92 cm/m   RA Area:     14.00 cm LA Vol (A2C):   106.0 ml 56.56 ml/m  RA Volume:   34.60 ml  18.46 ml/m LA Vol (A4C):   76.4 ml  40.77 ml/m LA Biplane Vol: 95.7 ml  51.07 ml/m  AORTIC VALVE                     PULMONIC VALVE AV Area (Vmax):    0.83 cm      PV Vmax:       0.83 m/s AV Area (Vmean):   0.83 cm  PV Peak grad:  2.7 mmHg AV Area (VTI):     0.86 cm AV Vmax:           398.00 cm/s AV Vmean:          266.600 cm/s AV VTI:            0.844 m AV Peak Grad:      63.4 mmHg AV Mean Grad:      35.0 mmHg LVOT Vmax:         105.00 cm/s LVOT Vmean:        70.467 cm/s LVOT VTI:          0.231 m LVOT/AV VTI ratio: 0.27 AI PHT:            498 msec  AORTA Ao Root diam: 3.00 cm Ao Asc diam:  2.70 cm MITRAL VALVE                  TRICUSPID VALVE MV Area (PHT): 3.28 cm       TR Peak grad:   22.1 mmHg MV Area VTI:   1.99 cm       TR Vmax:        235.00 cm/s MV Peak grad:  7.4 mmHg MV Mean grad:  3.0 mmHg       SHUNTS MV Vmax:       1.36 m/s       Systemic VTI:  0.23 m MV Vmean:      74.7 cm/s      Systemic Diam: 2.00 cm MV Decel Time: 231 msec MR Peak grad:    92.2 mmHg MR Mean grad:    57.0 mmHg MR Vmax:         480.00 cm/s MR Vmean:        358.0 cm/s MR PISA:         1.57 cm MR PISA Eff ROA: 10 mm MR PISA Radius:  0.50 cm MV E velocity: 154.00 cm/s MV A velocity: 49.40 cm/s MV E/A ratio:  3.12 Stanly Leavens MD Electronically signed by Stanly Leavens MD Signature Date/Time: 09/07/2024/2:30:33 PM    Final    DG Chest 2  View Result Date: 09/06/2024 EXAM: 2 VIEW(S) XRAY OF THE CHEST 09/06/2024 03:51:25 PM COMPARISON: 06/25/2024 CLINICAL HISTORY: Shortness of breath with exertion. FINDINGS: LUNGS AND PLEURA: Moderate left and small right pleural effusions with bibasilar atelectasis, new from previous exam. Increased interstitial markings compatible with mild edema. No pneumothorax. HEART AND MEDIASTINUM: Aortic atherosclerosis. No acute abnormality of the cardiac and mediastinal silhouettes. BONES AND SOFT TISSUES: Polyarticular degenerative change. No acute osseous abnormality. IMPRESSION: 1. Moderate left and small right pleural effusions with bibasilar atelectasis, new from prior exam. 2. Mild interstitial pulmonary edema. Electronically signed by: Waddell Calk MD 09/06/2024 04:32 PM EST RP Workstation: [id number]     Scheduled Meds:  aspirin  81 mg Oral Once   clopidogrel   75 mg Oral Daily   enoxaparin (LOVENOX) injection  40 mg Subcutaneous Daily   levothyroxine   88 mcg Oral Q0600   multivitamin with minerals  1 tablet Oral Daily   omega-3 acid ethyl esters  1 g Oral Daily   pravastatin   40 mg Oral Daily   Continuous Infusions:  sodium chloride  50 mL/hr at 09/08/24 0406     LOS: 2 days    Time spent:    Sigurd Pac, MD [facility]   09/08/2024, 11:03 AM

## 2024-09-09 ENCOUNTER — Encounter (HOSPITAL_COMMUNITY)
Admission: EM | Disposition: A | Discharge status: Home / Self Care | Payer: Self-pay | Source: Home / Self Care | Attending: Internal Medicine

## 2024-09-09 ENCOUNTER — Other Ambulatory Visit: Payer: Self-pay

## 2024-09-09 DIAGNOSIS — I272 Pulmonary hypertension, unspecified: Secondary | ICD-10-CM

## 2024-09-09 DIAGNOSIS — I2510 Atherosclerotic heart disease of native coronary artery without angina pectoris: Secondary | ICD-10-CM | POA: Diagnosis not present

## 2024-09-09 DIAGNOSIS — I350 Nonrheumatic aortic (valve) stenosis: Secondary | ICD-10-CM

## 2024-09-09 DIAGNOSIS — E1159 Type 2 diabetes mellitus with other circulatory complications: Secondary | ICD-10-CM

## 2024-09-09 DIAGNOSIS — I5021 Acute systolic (congestive) heart failure: Secondary | ICD-10-CM

## 2024-09-09 DIAGNOSIS — E039 Hypothyroidism, unspecified: Secondary | ICD-10-CM

## 2024-09-09 DIAGNOSIS — I130 Hypertensive heart and chronic kidney disease with heart failure and stage 1 through stage 4 chronic kidney disease, or unspecified chronic kidney disease: Secondary | ICD-10-CM

## 2024-09-09 DIAGNOSIS — E1122 Type 2 diabetes mellitus with diabetic chronic kidney disease: Secondary | ICD-10-CM

## 2024-09-09 DIAGNOSIS — N1832 Chronic kidney disease, stage 3b: Secondary | ICD-10-CM

## 2024-09-09 DIAGNOSIS — Z955 Presence of coronary angioplasty implant and graft: Secondary | ICD-10-CM

## 2024-09-09 DIAGNOSIS — E785 Hyperlipidemia, unspecified: Secondary | ICD-10-CM

## 2024-09-09 DIAGNOSIS — I080 Rheumatic disorders of both mitral and aortic valves: Secondary | ICD-10-CM

## 2024-09-09 DIAGNOSIS — I25110 Atherosclerotic heart disease of native coronary artery with unstable angina pectoris: Secondary | ICD-10-CM

## 2024-09-09 DIAGNOSIS — I10 Essential (primary) hypertension: Secondary | ICD-10-CM | POA: Diagnosis not present

## 2024-09-09 HISTORY — PX: RIGHT HEART CATH AND CORONARY ANGIOGRAPHY: CATH118264

## 2024-09-09 LAB — POCT I-STAT 7, (LYTES, BLD GAS, ICA,H+H)
Acid-base deficit: 4 mmol/L — ABNORMAL HIGH (ref 0.0–2.0)
Bicarbonate: 19.8 mmol/L — ABNORMAL LOW (ref 20.0–28.0)
Calcium, Ion: 1.2 mmol/L (ref 1.15–1.40)
HCT: 37 % — ABNORMAL LOW (ref 39.0–52.0)
Hemoglobin: 12.6 g/dL — ABNORMAL LOW (ref 13.0–17.0)
O2 Saturation: 93 %
Potassium: 3.8 mmol/L (ref 3.5–5.1)
Sodium: 135 mmol/L (ref 135–145)
TCO2: 21 mmol/L — ABNORMAL LOW (ref 22–32)
pCO2 arterial: 30.5 mmHg — ABNORMAL LOW (ref 32–48)
pH, Arterial: 7.42 (ref 7.35–7.45)
pO2, Arterial: 65 mmHg — ABNORMAL LOW (ref 83–108)

## 2024-09-09 LAB — BASIC METABOLIC PANEL WITH GFR
Anion gap: 13 (ref 5–15)
BUN: 27 mg/dL — ABNORMAL HIGH (ref 8–23)
CO2: 20 mmol/L — ABNORMAL LOW (ref 22–32)
Calcium: 8.6 mg/dL — ABNORMAL LOW (ref 8.9–10.3)
Chloride: 101 mmol/L (ref 98–111)
Creatinine, Ser: 1.59 mg/dL — ABNORMAL HIGH (ref 0.61–1.24)
GFR, Estimated: 41 mL/min — ABNORMAL LOW (ref >60)
Glucose, Bld: 103 mg/dL — ABNORMAL HIGH (ref 70–99)
Potassium: 3.9 mmol/L (ref 3.5–5.1)
Sodium: 134 mmol/L — ABNORMAL LOW (ref 135–145)

## 2024-09-09 LAB — POCT I-STAT EG7
Acid-base deficit: 7 mmol/L — ABNORMAL HIGH (ref 0.0–2.0)
Bicarbonate: 18.3 mmol/L — ABNORMAL LOW (ref 20.0–28.0)
Calcium, Ion: 1.12 mmol/L — ABNORMAL LOW (ref 1.15–1.40)
HCT: 37 % — ABNORMAL LOW (ref 39.0–52.0)
Hemoglobin: 12.6 g/dL — ABNORMAL LOW (ref 13.0–17.0)
O2 Saturation: 59 %
Potassium: 3.5 mmol/L (ref 3.5–5.1)
Sodium: 125 mmol/L — ABNORMAL LOW (ref 135–145)
TCO2: 19 mmol/L — ABNORMAL LOW (ref 22–32)
pCO2, Ven: 34.9 mmHg — ABNORMAL LOW (ref 44–60)
pH, Ven: 7.328 (ref 7.25–7.43)
pO2, Ven: 33 mmHg (ref 32–45)

## 2024-09-09 SURGERY — RIGHT HEART CATH AND CORONARY ANGIOGRAPHY
Anesthesia: LOCAL

## 2024-09-09 MED ORDER — HEPARIN SODIUM (PORCINE) 1000 UNIT/ML IJ SOLN
INTRAMUSCULAR | Status: AC
  Filled 2024-09-09: qty 10

## 2024-09-09 MED ORDER — SODIUM CHLORIDE 0.9 % IV SOLN: INTRAVENOUS | Status: AC

## 2024-09-09 MED ORDER — ASPIRIN 81 MG PO CHEW
81.0000 mg | CHEWABLE_TABLET | Freq: Once | ORAL | Status: AC
  Administered 2024-09-09: 81 mg via ORAL
  Filled 2024-09-09: qty 1

## 2024-09-09 MED ORDER — LIDOCAINE HCL (PF) 1 % IJ SOLN
INTRAMUSCULAR | Status: AC
Start: 2024-09-09 — End: 2024-09-09
  Filled 2024-09-09: qty 30

## 2024-09-09 MED ORDER — LIDOCAINE HCL (PF) 1 % IJ SOLN
INTRAMUSCULAR | Status: DC | PRN
  Administered 2024-09-09 (×2): 5 mL via INTRADERMAL

## 2024-09-09 MED ORDER — SODIUM CHLORIDE 0.9% FLUSH: 3.0000 mL | INTRAVENOUS | Status: DC | PRN

## 2024-09-09 MED ORDER — VERAPAMIL HCL 2.5 MG/ML IV SOLN
INTRAVENOUS | Status: AC
  Filled 2024-09-09: qty 2

## 2024-09-09 MED ORDER — SODIUM CHLORIDE 0.9 % IV SOLN: 250.0000 mL | INTRAVENOUS | Status: AC | PRN

## 2024-09-09 MED ORDER — HYDRALAZINE HCL 20 MG/ML IJ SOLN: 10.0000 mg | INTRAMUSCULAR | Status: AC | PRN

## 2024-09-09 MED ORDER — HEPARIN SODIUM (PORCINE) 1000 UNIT/ML IJ SOLN
INTRAMUSCULAR | Status: DC | PRN
  Administered 2024-09-09: 3500 [IU] via INTRAVENOUS

## 2024-09-09 MED ORDER — VERAPAMIL HCL 2.5 MG/ML IV SOLN
INTRAVENOUS | Status: DC | PRN
  Administered 2024-09-09 (×2): 10 mL via INTRA_ARTERIAL

## 2024-09-09 MED ORDER — SODIUM CHLORIDE 0.9% FLUSH
3.0000 mL | Freq: Two times a day (BID) | INTRAVENOUS | Status: DC
  Administered 2024-09-10 – 2024-09-11 (×4): 3 mL via INTRAVENOUS

## 2024-09-09 MED ORDER — HEPARIN (PORCINE) IN NACL 1000-0.9 UT/500ML-% IV SOLN
INTRAVENOUS | Status: DC | PRN
Start: 2024-09-09 — End: 2024-09-09
  Administered 2024-09-09 (×2): 500 mL

## 2024-09-09 MED ORDER — ENOXAPARIN SODIUM 40 MG/0.4ML IJ SOSY
40.0000 mg | PREFILLED_SYRINGE | INTRAMUSCULAR | Status: DC
  Administered 2024-09-10 – 2024-09-11 (×2): 40 mg via SUBCUTANEOUS
  Filled 2024-09-09 (×3): qty 0.4

## 2024-09-09 SURGICAL SUPPLY — 9 items
CATH 5FR JL3.5 JR4 ANG PIG MP (CATHETERS) IMPLANT
CATH BALLN WEDGE 5F 110CM (CATHETERS) IMPLANT
DEVICE RAD COMP TR BAND LRG (VASCULAR PRODUCTS) IMPLANT
GLIDESHEATH SLEND SS 6F .021 (SHEATH) IMPLANT
GUIDEWIRE INQWIRE 1.5J.035X260 (WIRE) IMPLANT
PACK CARDIAC CATHETERIZATION (CUSTOM PROCEDURE TRAY) ×2 IMPLANT
SET ATX-X65L (MISCELLANEOUS) IMPLANT
SHEATH GLIDE SLENDER 4/5FR (SHEATH) IMPLANT
WIRE HI TORQ VERSACORE-J 145CM (WIRE) IMPLANT

## 2024-09-09 NOTE — Progress Notes (Signed)
 Gregory Rummer, NP came to bedside to look at Pts. Brachial site. She says area feels soft. Applied coban per provider instruction.

## 2024-09-09 NOTE — Consult Note (Cosign Needed)
 [address]       [place] [address]             [phone]        [facility] Medical Record #[id number] Date of Birth: [birth year]  Referring: Dr. Wonda Primary Care: Nche, [name], NP Primary Cardiologist:Christopher Verlin, MD  Chief Complaint:    Chief Complaint  Patient presents with   Shortness of Breath  Reason for consultation: Coronary artery disease, moderate to severe MR, and severe AS  History of Present Illness:     This is a 89 year old male with a past medical history of coronary artery disease, hypertension, hyperlipidemia, diabetes mellitus, hypothyroidism, CKD (stage IIIb) who presented to his PCP office (he saw NP because Dr. Katheen , his PCP, was unavailable) with complaints of worsening shortness of breath. Patient also complained of more fatigue than usual, orthopnea and discomfort while lying flat.  He denied chest pain, nausea, syncope, or diaphoresis. PCP ordered labs and chest x ray and with the results there was concern for acute on chronic heart failure. It was recommended he go to the ED for further evaluation and treatment.  EKG showed no acute ischemic changes but RBBB. BNP was 1,123 and creatinine was 1.57. Initial Troponin I (high sensitivity) was 36. CXR showed moderate left and small right pleural effusions with bibasilar atelectasis and mild pulmonary interstitial edema. He was given Lasix. Cardiology was consulted.  Echocardiogram done 09/07/2024 showed LVEF 40-45%, moderate to severe mitral valve regurgitation, mild AI, severe aortic stenosis, and mid and distal lateral wall, posterior wall, apical septal segment, apical anterior segment, and apical inferior segment are hypokinetic. Cardiac catheterization done earlier today showed severe multivessel coronary artery disease-80-90% ostial LAD, chronic total occlusion of Ramus Intermediate, and 80-90% in stent restenosis at distal margin of the proximal/mid  RCA stent. Cardiothoracic consultation has been requested. At the time of my exam, patient denies chest pain or shortness of breath.   Patient lives alone and was very active, until recently. 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 Also, of note, he has been on Plavix  for awhile. He was last given Plavix  earlier this am but it has now been stopped.  Current Activity/ Functional Status: Patient is independent with mobility/ambulation, transfers, ADL's, IADL's.   Zubrod Score: At the time of surgery this patient's most appropriate activity status/level should be described as: []     0    Normal activity, no symptoms [x]     1    Restricted in physical strenuous activity but ambulatory, able to do out light work []     2    Ambulatory and capable of self care, unable to do work activities, up and about more than 50%  Of the time                            []     3    Only limited self care, in bed greater than 50% of waking hours []     4    Completely disabled, no self care, confined to bed or chair []     5  Moribund  Past Medical History:  Diagnosis Date   Aortic stenosis    Coronary artery disease    PCI, STent to RCA   Diabetes mellitus without complication (HCC)    Hyperlipidemia    Hypothyroidism    Rash    Thyroid  disease    hypothyroidism    Past Surgical History:  Procedure Laterality Date   APPENDECTOMY     CARDIAC CATHETERIZATION     2007 occluded ramus inter. , PCI to RCA   CARPAL TUNNEL RELEASE     DECOMPRESSIVE LUMBAR LAMINECTOMY LEVEL 2 N/A 12/20/2022   Procedure: LUMBAR THREE THROUGH FIVE LAMINECTOMIES AND FORAMINOTOMIES;  Surgeon: Georgina Ozell LABOR, MD;  Location: [facility] OR;  Service: Orthopedics;  Laterality: N/A;   EYE  SURGERY Bilateral    cataract surgery   LUMBAR LAMINECTOMY/DECOMPRESSION MICRODISCECTOMY N/A 12/22/2022   Procedure: POSTERIOR LUMBAR DURAL REPAIR;  Surgeon: Georgina Ozell LABOR, MD;  Location: [facility] OR;  Service: Orthopedics;  Laterality: N/A;   TONSILLECTOMY      Social History   Tobacco Use  Smoking Status Former   Current packs/day: 0.00   Types: Cigarettes   Quit date: 11/04/1960   Years since quitting: 63.8  Smokeless Tobacco Never    Social History   Substance and Sexual Activity  Alcohol Use No    Allergies: No Known Allergies  Current Facility-Administered Medications  Medication Dose Route Frequency Provider Last Rate Last Admin   0.9 %  sodium chloride  infusion   Intravenous Continuous End, Christopher, MD 50 mL/hr at 09/09/24 1154 New Bag at 09/09/24 1154   0.9 %  sodium chloride  infusion  250 mL Intravenous PRN End, Lonni, MD       acetaminophen  (TYLENOL ) tablet 500 mg  500 mg Oral Q6H PRN End, Lonni, MD   500 mg at 09/09/24 1215   [START ON 09/10/2024] enoxaparin (LOVENOX) injection 40 mg  40 mg Subcutaneous Q24H End, Christopher, MD       hydrALAZINE (APRESOLINE) injection 10 mg  10 mg Intravenous Q20 Min PRN End, Lonni, MD       levothyroxine  (SYNTHROID ) tablet 88 mcg  88 mcg Oral Q0600 End, Christopher, MD   88 mcg at 09/09/24 0615   melatonin tablet 5 mg  5 mg Oral QHS PRN End, Lonni, MD   5 mg at 09/07/24 2132   multivitamin with minerals tablet 1 tablet  1 tablet Oral Daily End, Christopher, MD   1 tablet at 09/09/24 0850   omega-3 acid ethyl esters (LOVAZA ) capsule 1 g  1 g Oral Daily End, Christopher, MD   1 g at 09/09/24 0850   polyethylene glycol (MIRALAX  / GLYCOLAX ) packet 17 g  17 g Oral Daily PRN End, Lonni, MD       pravastatin  (PRAVACHOL ) tablet 40 mg  40 mg Oral Daily End, Christopher, MD   40 mg at 09/09/24 0850   prochlorperazine (COMPAZINE) injection 5 mg  5 mg Intravenous Q6H PRN End, Lonni, MD   5 mg at 09/09/24 1230    sodium chloride  flush (NS) 0.9 % injection 3 mL  3 mL Intravenous Q12H End, Christopher, MD       sodium chloride  flush (NS) 0.9 % injection 3 mL  3 mL Intravenous PRN End, Lonni, MD        Medications Prior to Admission  Medication Sig Dispense Refill Last Dose/Taking   acetaminophen  (TYLENOL ) 500 MG tablet Take 1,000 mg by mouth every 6 (six) hours as needed for mild pain (pain score  1-3) or headache.   09/06/2024 Morning   amLODipine  (NORVASC ) 5 MG tablet Take 1 tablet (5 mg total) by mouth daily. 90 tablet 3 09/06/2024 at  9:00 AM   clopidogrel  (PLAVIX ) 75 MG tablet Take 1 tablet (75 mg total) by mouth daily. 90 tablet 3 09/06/2024 at  9:00 AM   fish oil-omega-3 fatty acids 1000 MG capsule Take 1 g by mouth daily.   09/06/2024 at  9:00 AM   levothyroxine  (SYNTHROID ) 88 MCG tablet Take 1 tablet (88 mcg total) by mouth daily before breakfast. 90 tablet 1 09/06/2024 at  7:30 AM   losartan  (COZAAR ) 50 MG tablet Take 50 mg by mouth daily.   09/06/2024 at  9:00 AM   Misc Natural Products (OSTEO BI-FLEX ADV TRIPLE ST) TABS Take 1 tablet by mouth in the morning.   09/06/2024 at  9:00 AM   Multiple Vitamins-Minerals (CENTRUM SILVER MEN 50+) TABS Take 1 tablet by mouth in the morning.   09/06/2024 at  9:00 AM   pravastatin  (PRAVACHOL ) 40 MG tablet Take 1 tablet (40 mg total) by mouth daily. 90 tablet 3 09/06/2024 at  9:00 AM    Family History  Problem Relation Age of Onset   Hypertension Mother    Hypertension Father    Allergic rhinitis Neg Hx    Angioedema Neg Hx    Asthma Neg Hx    Eczema Neg Hx    Immunodeficiency Neg Hx    Urticaria Neg Hx   Mother died at age 2 Father had a stroke, he died at age 85   Review of Systems:    Cardiac Review of Systems: Y or  [  N  ]= no  Chest Pain [  N  ]  Resting SOB [  Y-on admission ] Exertional SOB  [ Y-on admission ]  Orthopnea [ Y-on admission ]   Pedal Edema [  N ]     Syncope  [ N ]     General Review of Systems: [Y] = yes [N   ]=no Constitional: fatigue [ Y ]; nausea [ N ]; fever [ N ]; ]                                                               Dental:Has partial bottom dentures and full upper dentures   Eye : blurred vision [ N ]; Amaurosis fugax[ N ]; Resp: cough [ N ];  wheezing[ N ];  hemoptysis[ N ];  GI: vomiting[ N ];   melena[  N];  hematochezia [ N ];  GU:  hematuria[ N ];               Skin: rash, swelling[ N ];,   peripheral edema[ N ];  or itching[  ];  Heme/Lymph:  anemia[ N ];  Neuro: TIA[ N ];  stroke[ N ];  seizures[  N];     Endocrine: diabetes[ Y ];                 Physical Exam: BP (!) 120/45   Pulse 76   Temp 97.7 F (36.5 C) (Oral)   Resp 18   Ht 5' 9 (1.753 m)   Wt 68.6 kg   SpO2 97%   BMI 22.33 kg/m  General appearance: alert, cooperative, and no distress Head: Normocephalic, without obvious abnormality, atraumatic Neck: supple, symmetrical, trachea midline Resp: clear to auscultation bilaterally Cardio: RRR, grade III/VI systolic murmur heard best along right upper sternal border with radiation to both carotid arteries GI: Soft, non tender, bowel sounds present Extremities: No LE edema. Feet warm bilaterally Neurologic: Grossly normal  Diagnostic Studies & Laboratory data:     Recent Radiology Findings:   CARDIAC CATHETERIZATION Result Date: 09/09/2024 Conclusions: Severe multivessel coronary artery disease, as detailed below, including 80-90% ostial LAD stenosis, chronic total occlusion of the ramus intermedius, and 80-90% in-stent restenosis at the distal margin of proximal/mid RCA stent. Normal left and right heart filling pressures. Mild pulmonary hypertension. Mildly-moderately reduced Fick cardiac output/index. Recommendations: Continue workup of severe aortic stenosis, mitral regurgitation, and cardiomyopathy per structural heart team. Aggressive secondary prevention of coronary artery disease. Maintain net even fluid balance with escalation of goal-directed  medical therapy as tolerated. Lonni Hanson, MD [facility]  Dominance: Right     ECHOCARDIOGRAM REPORT       Patient Name:   BLAIKE VICKERS [facility] Date of Exam: 09/07/2024 Medical Rec #:  [id number]          Height:       69.0 in Accession #:    [id number]         Weight:       159.0 lb Date of Birth:  [birth year]         BSA:          1.874 m Patient Age:    89 years           BP:           120/84 mmHg Patient Gender: M                  HR:           75 bpm. Exam Location:  Inpatient  Procedure: 2D Echo (Both Spectral and Color Flow Doppler were utilized during            procedure).  Indications:    CHF   History:        Patient has prior history of Echocardiogram examinations. CHF;                 Aortic Valve Disease and Mitral Valve Disease.   Sonographer:    Norleen Amour Referring Phys: [id number] CAROLE N HALL  IMPRESSIONS    1. Left ventricular ejection fraction, by estimation, is 40 to 45%. Left ventricular ejection fraction by 2D MOD biplane is 46.1 %. The left ventricle has mildly decreased function. The left ventricle demonstrates regional wall motion abnormalities (see  scoring diagram/findings for description). Left ventricular diastolic parameters are indeterminate.  2. Right ventricular systolic function is normal. The right ventricular size is normal. There is normal pulmonary artery systolic pressure.  3. Left atrial size was severely dilated.  4. The mitral valve is degenerative. Moderate to severe mitral valve regurgitation.  5. The aortic valve is calcified. Aortic valve regurgitation is mild. Severe aortic valve stenosis. Aortic valve area, by VTI measures 0.86 cm. Aortic valve mean gradient measures 35.0 mmHg. Aortic valve Vmax measures 3.98 m/s. Aortic valve acceleration time measures 127 msec.  6. The inferior vena cava is normal in size with greater than 50% respiratory variability, suggesting right atrial pressure of 3  mmHg.  Comparison(s): Mitral regurgitation has increase. Aortic stenosis is still severe. Decrease in LVEF.  FINDINGS  Left  Ventricle: Left ventricular ejection fraction, by estimation, is 40 to 45%. Left ventricular ejection fraction by 2D MOD biplane is 46.1 %. The left ventricle has mildly decreased function. The left ventricle demonstrates regional wall motion abnormalities. The left ventricular internal cavity size was normal in size. There is no left ventricular hypertrophy. Left ventricular diastolic parameters are indeterminate.    LV Wall Scoring: The mid and distal lateral wall, posterior wall, apical septal segment, apical anterior segment, and apical inferior segment are hypokinetic.  Right Ventricle: The right ventricular size is normal. No increase in right ventricular wall thickness. Right ventricular systolic function is normal. There is normal pulmonary artery systolic pressure. The tricuspid regurgitant velocity is 2.35 m/s, and  with an assumed right atrial pressure of 3 mmHg, the estimated right ventricular systolic pressure is 25.1 mmHg.  Left Atrium: Left atrial size was severely dilated.  Right Atrium: Right atrial size was normal in size.  Pericardium: There is no evidence of pericardial effusion.  Mitral Valve: The mitral valve is degenerative in appearance. Moderate to severe mitral valve regurgitation. MV peak gradient, 7.4 mmHg. The mean mitral valve gradient is 3.0 mmHg.  Tricuspid Valve: The tricuspid valve is normal in structure. Tricuspid valve regurgitation is trivial. No evidence of tricuspid stenosis.  Aortic Valve: The aortic valve is calcified. Aortic valve regurgitation is mild. Aortic regurgitation PHT measures 498 msec. Severe aortic stenosis is present. Aortic valve mean gradient measures 35.0 mmHg. Aortic valve peak gradient measures 63.4 mmHg. Aortic valve area, by VTI measures 0.86 cm.  Pulmonic Valve: The pulmonic valve was  normal in structure. Pulmonic valve regurgitation is not visualized. No evidence of pulmonic stenosis.  Aorta: The aortic root and ascending aorta are structurally normal, with no evidence of dilitation.  Venous: The inferior vena cava is normal in size with greater than 50% respiratory variability, suggesting right atrial pressure of 3 mmHg.  IAS/Shunts: No atrial level shunt detected by color flow Doppler.   I have independently reviewed the above radiologic studies and discussed with the patient   Recent Lab Findings: Lab Results  Component Value Date   WBC 8.6 09/07/2024   HGB 13.7 09/07/2024   HCT 40.2 09/07/2024   PLT 266 09/07/2024   GLUCOSE 103 (H) 09/09/2024   CHOL 134 09/08/2024   TRIG 93 09/08/2024   HDL 39 (L) 09/08/2024   LDLDIRECT 66.0 07/06/2024   LDLCALC 76 09/08/2024   ALT 18 09/06/2024   AST 19 09/06/2024   NA 134 (L) 09/09/2024   K 3.9 09/09/2024   CL 101 09/09/2024   CREATININE 1.59 (H) 09/09/2024   BUN 27 (H) 09/09/2024   CO2 20 (L) 09/09/2024   TSH 2.483 06/26/2024   INR 1.00 01/18/2011   HGBA1C 6.2 07/06/2024   Assessment / Plan:   History of coronary artery disease-s/p PCI with stent to RCA 07', now with multivessel CAD with depressed LVEF 40-45%. He was on Plavix  prior to admission and was last given here earlier this am. Acute systolic heart failure Severe aortic stenosis-aortic valve peak gradient 63.4 mmHg Moderate to severe MR-peak MV gradient 7.4 mmHg Surgeon will review all studies and determine whether or not a candidate for surgery.  4. History of diabetes mellitus-HGA1C September 2025 6.2 5. History of hyperlipidemia-on Pravastatin  40 mg daily  6. History of hypothyroidism-on Levothyroxine  88 mcg daily 7. History of hypertension-he was on Amlodipine  5 mg daily and Losartan  50 mg daily prior to surgery 7. AKI on CKD (IIIb)-Creatinine this am 1.59  Chronic Kidney Disease   Stage I     GFR >90  Stage II    GFR 60-89  Stage IIIA GFR  45-59  Stage IIIB GFR 30-44  Stage IV   GFR 15-29  Stage V    GFR  <15  Lab Results  Component Value Date   CREATININE 1.59 (H) 09/09/2024   Estimated Creatinine Clearance: 29.4 mL/min (A) (by C-G formula based on SCr of 1.59 mg/dL (H)).    I  spent 25 minutes counseling the patient face to face.   Makynzi Eastland PA-C 09/09/2024 2:08 PM  Mr. Rao is a very pleasant 89 year old man who presents with unstable angina and decompensated heart failure.  He has known severe AS and also has multivessel CAD on LHC on 11/6.  He is very active - works at [employer], plays golf regularly, and lives alone.  He feels much better now than he did on admission - his shortness of breath has improved and he is chest pain free.    He has several ongoing issues:  severe aortic stenosis, newly reduced EF, low cardiac output, worsening MR and severe multivessel coronary artery disease including ostial LAD disease.  While I think surgery would be do-able as he definitely has surgical anatomy, I do not think surgery at his age would be in line with his goals of care.  There is a high likelihood he would end up in rehab or a nursing facility, especially since he lives alone, which he absolutely would not consider.  In this case, I think the risks of surgery outweigh the benefits and would recommend TAVR/PCI.    We discussed the patient in Heart Team this morning and have agreed no to surgery.  He will proceed with workup for TAVR and PCI.  Con Clunes, MD Cardiothoracic Surgery Pager: [phone]

## 2024-09-09 NOTE — Interval H&P Note (Signed)
 History and Physical Interval Note:  09/09/2024 10:20 AM  [name]  has presented today for surgery, with the diagnosis of aortic stenosis.  The various methods of treatment have been discussed with the patient and family. After consideration of risks, benefits and other options for treatment, the patient has consented to  Procedure(s): RIGHT/LEFT HEART CATH AND CORONARY ANGIOGRAPHY (N/A) as a surgical intervention.  The patient's history has been reviewed, patient examined, no change in status, stable for surgery.  I have reviewed the patient's chart and labs.  Questions were answered to the patient's satisfaction.    Cath Lab Visit (complete for each Cath Lab visit)  Clinical Evaluation Leading to the Procedure:   ACS: No.  Non-ACS:    Anginal/Heart Failure Classification: NYHA class IV  Anti-ischemic medical therapy: Minimal Therapy (1 class of medications)  Non-Invasive Test Results: No non-invasive testing performed  Prior CABG: No previous CABG  Brinly Maietta

## 2024-09-09 NOTE — Progress Notes (Signed)
 PROGRESS NOTE    [name]  FMW:[id number] DOB: [birth year] DOA: 09/06/2024 PCP: Gregory [name], NP  89/M with history of CAD, severe aortic stenosis, hypertension, dyslipidemia, hypothyroidism, CKD 3B presented to the ED with worsening dyspnea on exertion and orthopnea for 4 to 5 days. - In the ER he was tachypneic, BNP greater than 1100, chest x-ray with moderate left and small right pleural effusion, interstitial edema, troponin of 36.  Admitted, started on diuretics   Subjective: - As well, no complaints, n.p.o. for LHC  Assessment and Plan:  Acute on chronic combined CHF  severe aortic stenosis - Last echo 6/25 noted EF 55-60%, normal RV, severe aortic stenosis - Repeat echo yesterday noted EF of 40-45% with wall motion abnormality, normal RV, moderate to severe MR and severe aortic stenosis with mean gradient of 35 mmHg - Volume status has improved, mild uptrend in creatinine yesterday, now improving, diuretics on hold - Cards consulting, plan for TAVR workup, R and LHC today  Elevated troponin, demand ischemia - Due to CHF, no ACS  History of CAD -Concern for ischemic cardiomyopathy considering wall motion abnormalities, continue Plavix  and statin - LHC today  History of CKD 3B - Now improving  Hypothyroidism Continue Synthroid   DVT prophylaxis: Lovenox Code Status: Full code Family Communication: None present Disposition Plan: Home pending above workup  Consultants: Cards   Procedures:   Antimicrobials:    Objective: Vitals:   09/09/24 0602 09/09/24 0712 09/09/24 0749 09/09/24 1016  BP:  (!) 109/53 102/65   Pulse:  66 65   Resp:  (!) 21    Temp:  98.1 F (36.7 C) 98.2 F (36.8 C)   TempSrc:  Oral Oral   SpO2:  97% 99% 93%  Weight: 68.6 kg     Height:        Intake/Output Summary (Last 24 hours) at 09/09/2024 1051 Last data filed at 09/09/2024 0826 Gross per 24 hour  Intake 1950.14 ml  Output 1025 ml  Net 925.14 ml   Filed  Weights   09/07/24 1541 09/08/24 0348 09/09/24 0602  Weight: 68.2 kg 67.1 kg 68.6 kg    Examination:  General exam: Appears calm and comfortable  Respiratory system: Decreased at the bases Cardiovascular system: S1 & S2 heard, RRR.  Systolic murmur Abd: nondistended, soft and nontender.Normal bowel sounds heard. Central nervous system: Alert and oriented. No focal neurological deficits. Extremities: Trace edema Skin: No rashes Psychiatry:  Mood & affect appropriate.     Data Reviewed:   CBC: Recent Labs  Lab 09/06/24 1530 09/06/24 2019 09/07/24 0133  WBC 8.1 8.8 8.6  NEUTROABS 5.6 5.5  --   HGB 14.7 13.8 13.7  HCT 42.8 40.9 40.2  MCV 93.2 94.5 93.1  PLT 267.0 273 266   Basic Metabolic Panel: Recent Labs  Lab 09/06/24 1530 09/06/24 2019 09/07/24 0133 09/08/24 0323 09/09/24 0226  NA 134* 134* 134* 136 134*  K 4.7 4.6 4.0 4.3 3.9  CL 102 102 104 99 101  CO2 23 21* 19* 21* 20*  GLUCOSE 106* 112* 180* 115* 103*  BUN 23 22 23  28* 27*  CREATININE 1.57* 1.57* 1.60* 1.94* 1.59*  CALCIUM 9.8 9.4 9.1 9.6 8.6*  MG  --   --  2.0  --   --   PHOS  --   --  3.0  --   --    GFR: Estimated Creatinine Clearance: 29.4 mL/min (A) (by C-G formula based on SCr of 1.59 mg/dL (H)). Liver  Function Tests: Recent Labs  Lab 09/06/24 2019  AST 19  ALT 18  ALKPHOS 64  BILITOT 1.1  PROT 6.4*  ALBUMIN 3.8   No results for input(s): LIPASE, AMYLASE in the last 168 hours. No results for input(s): AMMONIA in the last 168 hours. Coagulation Profile: No results for input(s): INR, PROTIME in the last 168 hours. Cardiac Enzymes: No results for input(s): CKTOTAL, CKMB, CKMBINDEX, TROPONINI in the last 168 hours. BNP (last 3 results) Recent Labs    09/06/24 1530  PROBNP 6,771*   HbA1C: No results for input(s): HGBA1C in the last 72 hours. CBG: No results for input(s): GLUCAP in the last 168 hours. Lipid Profile: Recent Labs    09/08/24 0323  CHOL 134   HDL 39*  LDLCALC 76  TRIG 93  CHOLHDL 3.4   Thyroid  Function Tests: No results for input(s): TSH, T4TOTAL, FREET4, T3FREE, THYROIDAB in the last 72 hours. Anemia Panel: No results for input(s): VITAMINB12, FOLATE, FERRITIN, TIBC, IRON, RETICCTPCT in the last 72 hours. Urine analysis:    Component Value Date/Time   COLORURINE YELLOW 06/26/2024 0233   APPEARANCEUR CLEAR 06/26/2024 0233   LABSPEC 1.018 06/26/2024 0233   PHURINE 5.0 06/26/2024 0233   GLUCOSEU NEGATIVE 06/26/2024 0233   HGBUR NEGATIVE 06/26/2024 0233   BILIRUBINUR NEGATIVE 06/26/2024 0233   KETONESUR NEGATIVE 06/26/2024 0233   PROTEINUR NEGATIVE 06/26/2024 0233   NITRITE NEGATIVE 06/26/2024 0233   LEUKOCYTESUR NEGATIVE 06/26/2024 0233   Sepsis Labs: @LABRCNTIP (procalcitonin:4,lacticidven:4)  )No results found for this or any previous visit (from the past 240 hours).   Radiology Studies: ECHOCARDIOGRAM COMPLETE Result Date: 09/07/2024    ECHOCARDIOGRAM REPORT   Patient Name:   [name] [facility] Date of Exam: 09/07/2024 Medical Rec #:  [id number]          Height:       69.0 in Accession #:    [id number]         Weight:       159.0 lb Date of Birth:  12/11/1929         BSA:          1.874 m Patient Age:    89 years           BP:           120/84 mmHg Patient Gender: M                  HR:           75 bpm. Exam Location:  Inpatient Procedure: 2D Echo (Both Spectral and Color Flow Doppler were utilized during            procedure). Indications:    CHF  History:        Patient has prior history of Echocardiogram examinations. CHF;                 Aortic Valve Disease and Mitral Valve Disease.  Sonographer:    [name] Referring Phys: [id number] [name] IMPRESSIONS  1. Left ventricular ejection fraction, by estimation, is 40 to 45%. Left ventricular ejection fraction by 2D MOD biplane is 46.1 %. The left ventricle has mildly decreased function. The left ventricle demonstrates regional wall motion  abnormalities (see  scoring diagram/findings for description). Left ventricular diastolic parameters are indeterminate.  2. Right ventricular systolic function is normal. The right ventricular size is normal. There is normal pulmonary artery systolic pressure.  3. Left atrial size was severely dilated.  4.  The mitral valve is degenerative. Moderate to severe mitral valve regurgitation.  5. The aortic valve is calcified. Aortic valve regurgitation is mild. Severe aortic valve stenosis. Aortic valve area, by VTI measures 0.86 cm. Aortic valve mean gradient measures 35.0 mmHg. Aortic valve Vmax measures 3.98 m/s. Aortic valve acceleration time measures 127 msec.  6. The inferior vena cava is normal in size with greater than 50% respiratory variability, suggesting right atrial pressure of 3 mmHg. Comparison(s): Mitral regurgitation has increase. Aortic stenosis is still severe. Decrease in LVEF. FINDINGS  Left Ventricle: Left ventricular ejection fraction, by estimation, is 40 to 45%. Left ventricular ejection fraction by 2D MOD biplane is 46.1 %. The left ventricle has mildly decreased function. The left ventricle demonstrates regional wall motion abnormalities. The left ventricular internal cavity size was normal in size. There is no left ventricular hypertrophy. Left ventricular diastolic parameters are indeterminate.  LV Wall Scoring: The mid and distal lateral wall, posterior wall, apical septal segment, apical anterior segment, and apical inferior segment are hypokinetic. Right Ventricle: The right ventricular size is normal. No increase in right ventricular wall thickness. Right ventricular systolic function is normal. There is normal pulmonary artery systolic pressure. The tricuspid regurgitant velocity is 2.35 m/s, and  with an assumed right atrial pressure of 3 mmHg, the estimated right ventricular systolic pressure is 25.1 mmHg. Left Atrium: Left atrial size was severely dilated. Right Atrium: Right atrial  size was normal in size. Pericardium: There is no evidence of pericardial effusion. Mitral Valve: The mitral valve is degenerative in appearance. Moderate to severe mitral valve regurgitation. MV peak gradient, 7.4 mmHg. The mean mitral valve gradient is 3.0 mmHg. Tricuspid Valve: The tricuspid valve is normal in structure. Tricuspid valve regurgitation is trivial. No evidence of tricuspid stenosis. Aortic Valve: The aortic valve is calcified. Aortic valve regurgitation is mild. Aortic regurgitation PHT measures 498 msec. Severe aortic stenosis is present. Aortic valve mean gradient measures 35.0 mmHg. Aortic valve peak gradient measures 63.4 mmHg. Aortic valve area, by VTI measures 0.86 cm. Pulmonic Valve: The pulmonic valve was normal in structure. Pulmonic valve regurgitation is not visualized. No evidence of pulmonic stenosis. Aorta: The aortic root and ascending aorta are structurally normal, with no evidence of dilitation. Venous: The inferior vena cava is normal in size with greater than 50% respiratory variability, suggesting right atrial pressure of 3 mmHg. IAS/Shunts: No atrial level shunt detected by color flow Doppler.  LEFT VENTRICLE PLAX 2D                        Biplane EF (MOD) LVIDd:         5.80 cm         LV Biplane EF:   Left LVIDs:         4.60 cm                          ventricular LV PW:         0.80 cm                          ejection LV IVS:        0.90 cm                          fraction by LVOT diam:     2.00 cm  2D MOD LV SV:         72                               biplane is LV SV Index:   39                               46.1 %. LVOT Area:     3.14 cm                                Diastology                                LV e' medial:    5.61 cm/s LV Volumes (MOD)               LV E/e' medial:  27.5 LV vol d, MOD    170.0 ml      LV e' lateral:   5.70 cm/s A2C:                           LV E/e' lateral: 27.0 LV vol d, MOD    168.0 ml A4C: LV vol s, MOD     90.9 ml A2C: LV vol s, MOD    79.1 ml A4C: LV SV MOD A2C:   79.1 ml LV SV MOD A4C:   168.0 ml LV SV MOD BP:    78.0 ml RIGHT VENTRICLE             IVC RV Basal diam:  3.10 cm     IVC diam: 1.80 cm RV S prime:     11.40 cm/s TAPSE (M-mode): 2.1 cm      PULMONARY VEINS                             Diastolic Velocity: 87.10 cm/s                             S/D Velocity:       0.30                             Systolic Velocity:  22.70 cm/s LEFT ATRIUM              Index        RIGHT ATRIUM           Index LA diam:        3.60 cm  1.92 cm/m   RA Area:     14.00 cm LA Vol (A2C):   106.0 ml 56.56 ml/m  RA Volume:   34.60 ml  18.46 ml/m LA Vol (A4C):   76.4 ml  40.77 ml/m LA Biplane Vol: 95.7 ml  51.07 ml/m  AORTIC VALVE                     PULMONIC VALVE AV Area (Vmax):    0.83 cm      PV Vmax:       0.83 m/s AV Area (Vmean):   0.83 cm  PV Peak grad:  2.7 mmHg AV Area (VTI):     0.86 cm AV Vmax:           398.00 cm/s AV Vmean:          266.600 cm/s AV VTI:            0.844 m AV Peak Grad:      63.4 mmHg AV Mean Grad:      35.0 mmHg LVOT Vmax:         105.00 cm/s LVOT Vmean:        70.467 cm/s LVOT VTI:          0.231 m LVOT/AV VTI ratio: 0.27 AI PHT:            498 msec  AORTA Ao Root diam: 3.00 cm Ao Asc diam:  2.70 cm MITRAL VALVE                  TRICUSPID VALVE MV Area (PHT): 3.28 cm       TR Peak grad:   22.1 mmHg MV Area VTI:   1.99 cm       TR Vmax:        235.00 cm/s MV Peak grad:  7.4 mmHg MV Mean grad:  3.0 mmHg       SHUNTS MV Vmax:       1.36 m/s       Systemic VTI:  0.23 m MV Vmean:      74.7 cm/s      Systemic Diam: 2.00 cm MV Decel Time: 231 msec MR Peak grad:    92.2 mmHg MR Mean grad:    57.0 mmHg MR Vmax:         480.00 cm/s MR Vmean:        358.0 cm/s MR PISA:         1.57 cm MR PISA Eff ROA: 10 mm MR PISA Radius:  0.50 cm MV E velocity: 154.00 cm/s MV A velocity: 49.40 cm/s MV E/A ratio:  3.12 Stanly Leavens MD Electronically signed by Stanly Leavens MD Signature Date/Time:  09/07/2024/2:30:33 PM    Final      Scheduled Meds:  [MAR Hold] clopidogrel   75 mg Oral Daily   [MAR Hold] enoxaparin (LOVENOX) injection  30 mg Subcutaneous Daily   [MAR Hold] levothyroxine   88 mcg Oral Q0600   [MAR Hold] multivitamin with minerals  1 tablet Oral Daily   [MAR Hold] omega-3 acid ethyl esters  1 g Oral Daily   [MAR Hold] pravastatin   40 mg Oral Daily   Continuous Infusions:  sodium chloride  50 mL/hr at 09/09/24 0718     LOS: 3 days    Time spent:    Sigurd Pac, MD [facility]   09/09/2024, 10:51 AM

## 2024-09-09 NOTE — Plan of Care (Signed)

## 2024-09-09 NOTE — Progress Notes (Signed)
  Progress Note  Patient Name: [name] Date of Encounter: 09/09/2024 [facility] Cardiologist: Lonni Cash, MD   Interval Summary   No events overnight, no chest pain or shortness of breath.  Awaiting cardiac catheterization today  Vital Signs Vitals:   09/09/24 0331 09/09/24 0602 09/09/24 0712 09/09/24 0749  BP: (!) 99/49  (!) 109/53 102/65  Pulse: 65  66 65  Resp: 17  (!) 21   Temp: 98.5 F (36.9 C)  98.1 F (36.7 C) 98.2 F (36.8 C)  TempSrc: Oral  Oral Oral  SpO2: 96%  97% 99%  Weight:  68.6 kg    Height:        Intake/Output Summary (Last 24 hours) at 09/09/2024 0920 Last data filed at 09/09/2024 9173 Gross per 24 hour  Intake 1950.14 ml  Output 1025 ml  Net 925.14 ml      09/09/2024    6:02 AM 09/08/2024    3:48 AM 09/07/2024    3:41 PM  Last 3 Weights  Weight (lbs) 151 lb 3.8 oz 147 lb 14.4 oz 150 lb 5.7 oz  Weight (kg) 68.6 kg 67.087 kg 68.2 kg      Telemetry/ECG  Normal sinus rhythm- Personally Reviewed  Physical Exam  GEN: Well-appearing elderly male in no acute distress.   Neck: No JVD Cardiac: RRR, 2/6 harsh crescendo decrescendo murmur, late peaking, at the right upper sternal border Respiratory: Clear to auscultation bilaterally. GI: Soft, nontender, non-distended  MS: No edema  Assessment & Plan  1.  Severe, stage D1 aortic stenosis, now with evidence of progressive heart failure NYHA functional class III, and new LV dysfunction with LVEF 40 to 45%.  Plan right/left heart catheterization in preparation for TAVR.  Procedure scheduled today.  Anticipate hospital discharge post cardiac catheterization and continuation of TAVR workup as an outpatient and as long as kidney function is stable.  He will require a gated CTA of the heart, and a CTA of the chest, abdomen, and pelvis, and outpatient cardiac surgical consultation as part of a multidisciplinary approach to his care. 2.  Acute HFrEF: Likely related to valvular heart  disease.  Improved with IV diuretic therapy now on hold because of AKI.  Patient clinically stable.  Assess hemodynamics with cardiac catheterization today. 3.  AKI on CKD 3B: Creatinine trended up to 1.94 and is now back to baseline at 1.59.  Appropriate to proceed with limited contrast diagnostic only cardiac catheterization today. 4.  CAD: Patient had history of RCA stenting.  Continue on clopidogrel  and statin drug.  Assessed today with cardiac catheterization. 5.  Hypertension: Was on losartan  and amlodipine .  Losartan  on hold in the setting of AKI.  Amlodipine  on hold to avoid hypotension.  Blood pressure running in the low normal range, recent readings ranging 99-118/49-69 mmHg.  Disposition: Pending his cardiac catheterization result, he can likely be discharged home today.  Would continue to hold losartan  and amlodipine .  Consider discharge on furosemide 20 mg daily pending assessment of his hemodynamics.  He will continue outpatient TAVR workup as outlined above.  For questions or updates, please contact [facility] Please consult [email or web address] for contact info under         Signed, [name], MD

## 2024-09-09 NOTE — Progress Notes (Signed)
 Heart Failure Navigator Progress Note  Assessed for Heart & Vascular TOC clinic readiness.  Patient does not meet criteria due to patient is going for a TAVR evaluation. No HF TOC. .   Navigator will sign off at this time.   Stephane Haddock, BSN, Scientist, Clinical (histocompatibility And Immunogenetics) Only

## 2024-09-09 NOTE — Care Management Important Message (Signed)
 Important Message  Patient Details  Name: [name] MRN: [id number] Date of Birth: [birth year]   Important Message Given:  Yes - Medicare IM     [name] 09/09/2024, 12:43 PM

## 2024-09-10 ENCOUNTER — Encounter (HOSPITAL_COMMUNITY): Payer: Self-pay | Admitting: Internal Medicine

## 2024-09-10 DIAGNOSIS — I5021 Acute systolic (congestive) heart failure: Secondary | ICD-10-CM | POA: Diagnosis not present

## 2024-09-10 DIAGNOSIS — I10 Essential (primary) hypertension: Secondary | ICD-10-CM | POA: Diagnosis not present

## 2024-09-10 DIAGNOSIS — I2510 Atherosclerotic heart disease of native coronary artery without angina pectoris: Secondary | ICD-10-CM | POA: Diagnosis not present

## 2024-09-10 DIAGNOSIS — E785 Hyperlipidemia, unspecified: Secondary | ICD-10-CM | POA: Diagnosis not present

## 2024-09-10 LAB — BASIC METABOLIC PANEL WITH GFR
Anion gap: 9 (ref 5–15)
BUN: 23 mg/dL (ref 8–23)
CO2: 20 mmol/L — ABNORMAL LOW (ref 22–32)
Calcium: 8.5 mg/dL — ABNORMAL LOW (ref 8.9–10.3)
Chloride: 100 mmol/L (ref 98–111)
Creatinine, Ser: 1.56 mg/dL — ABNORMAL HIGH (ref 0.61–1.24)
GFR, Estimated: 42 mL/min — ABNORMAL LOW (ref >60)
Glucose, Bld: 117 mg/dL — ABNORMAL HIGH (ref 70–99)
Potassium: 4.1 mmol/L (ref 3.5–5.1)
Sodium: 129 mmol/L — ABNORMAL LOW (ref 135–145)

## 2024-09-10 MED ORDER — ATORVASTATIN CALCIUM 40 MG PO TABS
40.0000 mg | ORAL_TABLET | Freq: Every day | ORAL | Status: DC
  Administered 2024-09-11 – 2024-09-12 (×2): 40 mg via ORAL
  Filled 2024-09-10 (×2): qty 1

## 2024-09-10 MED ORDER — ASPIRIN 81 MG PO TBEC
81.0000 mg | DELAYED_RELEASE_TABLET | Freq: Every day | ORAL | Status: DC
  Administered 2024-09-10 – 2024-09-12 (×3): 81 mg via ORAL
  Filled 2024-09-10 (×3): qty 1

## 2024-09-10 MED ORDER — CLOPIDOGREL BISULFATE 75 MG PO TABS
75.0000 mg | ORAL_TABLET | Freq: Every day | ORAL | Status: DC
  Administered 2024-09-10 – 2024-09-12 (×3): 75 mg via ORAL
  Filled 2024-09-10 (×3): qty 1

## 2024-09-10 MED ORDER — ASPIRIN 81 MG PO TBEC: 81.0000 mg | DELAYED_RELEASE_TABLET | Freq: Every day | ORAL | Status: DC

## 2024-09-10 NOTE — Progress Notes (Signed)
 Mobility Specialist Progress Note:    09/10/24 1230  Mobility  Activity Ambulated with assistance  Level of Assistance Standby assist, set-up cues, supervision of patient - no hands on  Assistive Device None  Distance Ambulated (ft) 25 ft  Range of Motion/Exercises Active  Activity Response Tolerated well  Mobility Referral Yes  Mobility visit 1 Mobility  Mobility Specialist Start Time (ACUTE ONLY) 1230  Mobility Specialist Stop Time (ACUTE ONLY) 1237  Mobility Specialist Time Calculation (min) (ACUTE ONLY) 7 min   Received pt in room requesting assistance agreeable to session. No c/o any symptoms. Pt moving and ambulating well. Returned pt to room w/ all needs met.   Venetia Keel Mobility Specialist Please Neurosurgeon or Rehab Office at [phone]

## 2024-09-10 NOTE — Progress Notes (Addendum)
 Progress Note  Patient Name: [name] Date of Encounter: 09/10/2024 [facility] Cardiologist: Lonni Cash, MD    Interval Summary   Patient reports feeling well this AM. No chest pain, shortness of breath, lower extremity swelling. No dizziness, syncope, near syncope. Right radial cath site soft, nontender   Vital Signs Vitals:   09/09/24 2345 09/10/24 0300 09/10/24 0522 09/10/24 0700  BP: (!) 131/55   112/68  Pulse: 65   90  Resp: 20   (!) 21  Temp: (!) 97.5 F (36.4 C) (!) 97.5 F (36.4 C)  (!) 97.3 F (36.3 C)  TempSrc: Oral Oral  Oral  SpO2: 96%   93%  Weight:   68.8 kg   Height:        Intake/Output Summary (Last 24 hours) at 09/10/2024 0939 Last data filed at 09/10/2024 0930 Gross per 24 hour  Intake 537 ml  Output 550 ml  Net -13 ml      09/10/2024    5:22 AM 09/09/2024    6:02 AM 09/08/2024    3:48 AM  Last 3 Weights  Weight (lbs) 151 lb 10.8 oz 151 lb 3.8 oz 147 lb 14.4 oz  Weight (kg) 68.8 kg 68.6 kg 67.087 kg      Telemetry/ECG  NSR with occasional PVCs, ventricular trigeminy - Personally Reviewed  Physical Exam  GEN: No acute distress.  Sitting comfortably on the side of the bed  Neck: No JVD Cardiac: RRR, 2/6 systolic murmur at RUSB. Right radial cath site soft, nontender  Respiratory: Clear to auscultation bilaterally. Normal WOB on room air  GI: Soft, nontender, non-distended  MS: No edema in BLE  Assessment & Plan   CAD  Aortic Stenosis  Mitral Regurgitation  - Echocardiogram this admission showed EF 40-45% with regional wall motion abnormalities, normal RV systolic function, moderate-severe MR, mild AI, severe aortic stenosis with mean gradient 35 mmHg  - In the process of TAVR workup- underwent R/L heart catheterization yesterday that showed severe multivessel CAD including 80-90% ostial LAD stenosis, chronic total occlusion of the ramus intermedius, and 80-90% in-stent restenosis at the distal margin of  proximal/mid RCA stent. - Evaluated by CT surgery and discussed at heart team meeting- ultimately decided to proceed with TAVR and PCI. Dr. Wonda to discuss timing with patient   - OK to resume plavix  75 mg daily as he is not proceeding with CABG  - LDL 76 this admission- transition from pravastatin  to lipitor 40 mg daily   Acute HFrEF  - EF 40-45%  - Suspected to be related to valvular heart disease. Was treated with IV diuretic therapy, held due to AKI  - Creatinine stable at 1.56 today. Euvolemic on exam  -Remains neg -1.2 L since admission. Continue to hold diuretics for now  AKI on CKD stage IIIb  - Creatinine improved to 1.56 today   HTN  - Holding home amlodipine  and losartan  to avoid hypotension. BP well controlled overall   For questions or updates, please contact [facility] Please consult [email or web address] for contact info under   Signed, Rollo FABIENE Louder, PA-C    Patient seen, examined. Available data reviewed. Agree with findings, assessment, and plan as outlined by JORETTA Louder, PA-C. On my exam: Vitals:   09/10/24 0300 09/10/24 0700  BP:  112/68  Pulse:  90  Resp:  (!) 21  Temp: (!) 97.5 F (36.4 C) (!) 97.3 F (36.3 C)  SpO2:  93%   Pt is alert and  oriented, pleasant elderly male in NAD HEENT: normal Neck: JVP - normal Lungs: CTA bilaterally CV: RRR with 2/6 harsh crescendo decrescendo murmur at the RUSB Abd: soft, NT, Positive BS, no hepatomegaly Ext: no C/C/E, distal pulses intact and equal Skin: warm/dry no rash  Reviewed cardiac cath films, echo images, discussed case with Dr End who did cath yesterday and presented patient's case at our Multidisciplinary Heart Team meeting this morning. Very complex case in this highly functional but 89 year-old man who has severe 2 vessel CAD involving the ostium of the LAD and mid-RCA in the setting of acute HFrEF and severe aortic stenosis. Consensus is that he is not a candidate for cardiac surgery  (AVR/CABG) with his very advanced age. We agree that if he pursues treatment, he should have supported PCI of the LAD and RCA, and likely will require atherectomy of the proximal LAD and stenting will require extending the stent back into the left main. Options for support include IABP or Impella which might require BAV for placement because of severity of AS. I personally favor IABP support. The other option would be palliative medical therapy which would be associated with continued functional decline with worsening AS and heart failure. After our discussion, the patient would like to pursue treatment. He understands the normal risks of PCI are significantly increased due to coronary anatomy (calcification, ostial LAD involvement), presence of severe AS, and heart failure with reduced LVEF. I tried to call his daughter, [name], and left her a message on voicemail. Will discuss with the interventional cardiology team and try to coordinate his procedure next week. He is not having any symptoms at rest and could probably be DC'd home after we coordinate his PCI. He does not want to stay in the hospital through next week and he does not appear to have unstable coronary anatomy (calcified lesions). Will follow-up after final plan coordinated. In the meantime, add ASA 81 mg daily, continue clopidogrel , and repeat CXR to make sure interstitial edema improving.   [name], M.D. 09/10/2024 11:17 AM

## 2024-09-10 NOTE — Progress Notes (Signed)
 PROGRESS NOTE    [name]  FMW:[id number] DOB: 11/27/1929 DOA: 09/06/2024 PCP: [name], NP  89/M with history of CAD, severe aortic stenosis, hypertension, dyslipidemia, hypothyroidism, CKD 3B presented to the ED with worsening dyspnea on exertion and orthopnea for 4 to 5 days. - In the ER he was tachypneic, BNP greater than 1100, chest x-ray with moderate left and small right pleural effusion, interstitial edema, troponin of 36.  Admitted, started on diuretics - Improved with diuresis, echo noted EF down to 40-45 with wall motion abnormality, moderate to severe MR and severe aortic stenosis - R/LHC 11/6 noted multivessel CAD, almost normal filling pressures  Subjective: - Feels well, denies any dyspnea, spoke to T CTS yesterday regarding multivessel CAD  Assessment and Plan:  Acute on chronic combined CHF  severe aortic stenosis - Last echo 6/25 noted EF 55-60%, normal RV, severe aortic stenosis - Repeat echo yesterday noted EF of 40-45% with wall motion abnormality, normal RV, moderate to severe MR and severe aortic stenosis with mean gradient of 35 mmHg - Volume status has improved, hold diuretics -R/LHC yesterday with improved filling pressures, severe multivessel CAD including 80 to 90% ostial LAD stenosis, total occlusion of ramus intermedius, 80 to 90% in-stent stenosis of proximal/mid RCA stent -Seen by T CTS, plan to proceed with TAVR and PCI -Per cardiology  Elevated troponin, demand ischemia Multivessel CAD -As noted above, continue Plavix  and statin  History of CAD -Concern for ischemic cardiomyopathy considering wall motion abnormalities, continue Plavix  and statin - LHC today  History of CKD 3B - Now improving  Hypothyroidism Continue Synthroid   DVT prophylaxis: Lovenox Code Status: Full code Family Communication: None present Disposition Plan: Home pending above workup  Consultants: Cards   Procedures:   Antimicrobials:     Objective: Vitals:   09/09/24 2345 09/10/24 0300 09/10/24 0522 09/10/24 0700  BP: (!) 131/55   112/68  Pulse: 65   90  Resp: 20   (!) 21  Temp: (!) 97.5 F (36.4 C) (!) 97.5 F (36.4 C)  (!) 97.3 F (36.3 C)  TempSrc: Oral Oral  Oral  SpO2: 96%   93%  Weight:   68.8 kg   Height:        Intake/Output Summary (Last 24 hours) at 09/10/2024 1200 Last data filed at 09/10/2024 0930 Gross per 24 hour  Intake 537 ml  Output 550 ml  Net -13 ml   Filed Weights   09/08/24 0348 09/09/24 0602 09/10/24 0522  Weight: 67.1 kg 68.6 kg 68.8 kg    Examination:  General exam: Appears calm and comfortable  Respiratory system: Decreased at the bases Cardiovascular system: S1 & S2 heard, RRR.  Systolic murmur Abd: nondistended, soft and nontender.Normal bowel sounds heard. Central nervous system: Alert and oriented. No focal neurological deficits. Extremities: Trace edema Skin: No rashes Psychiatry:  Mood & affect appropriate.     Data Reviewed:   CBC: Recent Labs  Lab 09/06/24 1530 09/06/24 2019 09/07/24 0133 09/09/24 1040 09/09/24 1042  WBC 8.1 8.8 8.6  --   --   NEUTROABS 5.6 5.5  --   --   --   HGB 14.7 13.8 13.7 12.6* 12.6*  HCT 42.8 40.9 40.2 37.0* 37.0*  MCV 93.2 94.5 93.1  --   --   PLT 267.0 273 266  --   --    Basic Metabolic Panel: Recent Labs  Lab 09/06/24 2019 09/07/24 0133 09/08/24 0323 09/09/24 0226 09/09/24 1040 09/09/24 1042 09/10/24 0320  NA 134* 134* 136 134* 125* 135 129*  K 4.6 4.0 4.3 3.9 3.5 3.8 4.1  CL 102 104 99 101  --   --  100  CO2 21* 19* 21* 20*  --   --  20*  GLUCOSE 112* 180* 115* 103*  --   --  117*  BUN 22 23 28* 27*  --   --  23  CREATININE 1.57* 1.60* 1.94* 1.59*  --   --  1.56*  CALCIUM 9.4 9.1 9.6 8.6*  --   --  8.5*  MG  --  2.0  --   --   --   --   --   PHOS  --  3.0  --   --   --   --   --    GFR: Estimated Creatinine Clearance: 30 mL/min (A) (by C-G formula based on SCr of 1.56 mg/dL (H)). Liver Function  Tests: Recent Labs  Lab 09/06/24 2019  AST 19  ALT 18  ALKPHOS 64  BILITOT 1.1  PROT 6.4*  ALBUMIN 3.8   No results for input(s): LIPASE, AMYLASE in the last 168 hours. No results for input(s): AMMONIA in the last 168 hours. Coagulation Profile: No results for input(s): INR, PROTIME in the last 168 hours. Cardiac Enzymes: No results for input(s): CKTOTAL, CKMB, CKMBINDEX, TROPONINI in the last 168 hours. BNP (last 3 results) Recent Labs    09/06/24 1530  PROBNP 6,771*   HbA1C: No results for input(s): HGBA1C in the last 72 hours. CBG: No results for input(s): GLUCAP in the last 168 hours. Lipid Profile: Recent Labs    09/08/24 0323  CHOL 134  HDL 39*  LDLCALC 76  TRIG 93  CHOLHDL 3.4   Thyroid  Function Tests: No results for input(s): TSH, T4TOTAL, FREET4, T3FREE, THYROIDAB in the last 72 hours. Anemia Panel: No results for input(s): VITAMINB12, FOLATE, FERRITIN, TIBC, IRON, RETICCTPCT in the last 72 hours. Urine analysis:    Component Value Date/Time   COLORURINE YELLOW 06/26/2024 0233   APPEARANCEUR CLEAR 06/26/2024 0233   LABSPEC 1.018 06/26/2024 0233   PHURINE 5.0 06/26/2024 0233   GLUCOSEU NEGATIVE 06/26/2024 0233   HGBUR NEGATIVE 06/26/2024 0233   BILIRUBINUR NEGATIVE 06/26/2024 0233   KETONESUR NEGATIVE 06/26/2024 0233   PROTEINUR NEGATIVE 06/26/2024 0233   NITRITE NEGATIVE 06/26/2024 0233   LEUKOCYTESUR NEGATIVE 06/26/2024 0233   Sepsis Labs: @LABRCNTIP (procalcitonin:4,lacticidven:4)  )No results found for this or any previous visit (from the past 240 hours).   Radiology Studies: CARDIAC CATHETERIZATION Result Date: 09/09/2024 Conclusions: Severe multivessel coronary artery disease, as detailed below, including 80-90% ostial LAD stenosis, chronic total occlusion of the ramus intermedius, and 80-90% in-stent restenosis at the distal margin of proximal/mid RCA stent. Normal left and right heart filling  pressures. Mild pulmonary hypertension. Mildly-moderately reduced Fick cardiac output/index. Recommendations: Continue workup of severe aortic stenosis, mitral regurgitation, and cardiomyopathy per structural heart team. Aggressive secondary prevention of coronary artery disease. Maintain net even fluid balance with escalation of goal-directed medical therapy as tolerated. Lonni Hanson, MD [facility]    Scheduled Meds:  aspirin EC  81 mg Oral Daily   [START ON 09/11/2024] atorvastatin  40 mg Oral Daily   clopidogrel   75 mg Oral Daily   enoxaparin (LOVENOX) injection  40 mg Subcutaneous Q24H   levothyroxine   88 mcg Oral Q0600   multivitamin with minerals  1 tablet Oral Daily   omega-3 acid ethyl esters  1 g Oral Daily   sodium chloride   flush  3 mL Intravenous Q12H   Continuous Infusions:  sodium chloride        LOS: 4 days    Time spent:    Sigurd Pac, MD [facility]   09/10/2024, 12:00 PM

## 2024-09-10 NOTE — Heart Team MDD (Signed)
   Heart Team Multi-Disciplinary Discussion  Patient: [name]  DOB: 01-Aug-1930  MRN: [id number]   Date: 09/10/2024  07:00 AM    Attendees: Interventional Cardiology: Lurena Red, MD Lonni Hanson, MD Alm Clay, MD Cara Lovelace, MD Newman Lawrence, MD [name], MD Deatrice Cage, MD Debby Como, MD  Cardiothoracic Surgery: Linnie Rayas, MD   Additional Attendees: Con Clunes, MD   Patient History: 89 y.o. male with a hx of CAD s/p stenting to RCA, DM, HTN, HLD, hypothyroidism and severe aortic stenosis.   Risk Factors: Diabetes Mellitus Hypertension Hyperlipidemia Chronic Kidney Disease   CAD History: Prior PCI to RCA (2012) with overlapping stents   Review of Prior Angiography and PCI Procedures: Extensive review of coronary angiography from 09/09/2024 and from 2012.   Discussion: The teams discussion included high-risk anatomy with severe AS, low cardiac output, and calcified LAD lesion. It was deemed he was not a surgical candidate. Renal protection and strategies for PCI were discussed in detail.    Recommendations: PCI Additional Recommendations/Special Instructions: PCI with mechanical support if possible followed by TAVR   Shona Palma, RN  09/10/2024 1:33 PM

## 2024-09-10 NOTE — Plan of Care (Signed)

## 2024-09-11 DIAGNOSIS — I5023 Acute on chronic systolic (congestive) heart failure: Secondary | ICD-10-CM

## 2024-09-11 LAB — BASIC METABOLIC PANEL WITH GFR
Anion gap: 11 (ref 5–15)
BUN: 22 mg/dL (ref 8–23)
CO2: 20 mmol/L — ABNORMAL LOW (ref 22–32)
Calcium: 9.2 mg/dL (ref 8.9–10.3)
Chloride: 100 mmol/L (ref 98–111)
Creatinine, Ser: 1.5 mg/dL — ABNORMAL HIGH (ref 0.61–1.24)
GFR, Estimated: 44 mL/min — ABNORMAL LOW (ref >60)
Glucose, Bld: 113 mg/dL — ABNORMAL HIGH (ref 70–99)
Potassium: 4.3 mmol/L (ref 3.5–5.1)
Sodium: 131 mmol/L — ABNORMAL LOW (ref 135–145)

## 2024-09-11 MED ORDER — FUROSEMIDE 10 MG/ML IJ SOLN
40.0000 mg | Freq: Once | INTRAMUSCULAR | Status: AC
  Administered 2024-09-11: 40 mg via INTRAVENOUS
  Filled 2024-09-11: qty 4

## 2024-09-11 NOTE — Progress Notes (Signed)
 Progress Note  Patient Name: [name] Date of Encounter: 09/11/2024  Primary Cardiologist: Lonni Cash, MD   Subjective   More short of breath today and has chest heaviness when walking.   Inpatient Medications    Scheduled Meds:  aspirin EC  81 mg Oral Daily   atorvastatin  40 mg Oral Daily   clopidogrel   75 mg Oral Daily   enoxaparin (LOVENOX) injection  40 mg Subcutaneous Q24H   levothyroxine   88 mcg Oral Q0600   multivitamin with minerals  1 tablet Oral Daily   omega-3 acid ethyl esters  1 g Oral Daily   sodium chloride  flush  3 mL Intravenous Q12H   Continuous Infusions:  PRN Meds: acetaminophen , melatonin, polyethylene glycol, prochlorperazine, sodium chloride  flush   Vital Signs    Vitals:   09/10/24 2030 09/11/24 0007 09/11/24 0516 09/11/24 0524  BP: (!) 145/68 (!) 115/57 (!) 123/55   Pulse: 84 85 70   Resp: 20 18 (!) 22   Temp: 97.7 F (36.5 C) 97.7 F (36.5 C) 97.7 F (36.5 C)   TempSrc: Oral Oral Oral   SpO2: 93% 94% 95%   Weight:    68.6 kg  Height:        Intake/Output Summary (Last 24 hours) at 09/11/2024 0714 Last data filed at 09/11/2024 0516 Gross per 24 hour  Intake 1240 ml  Output 1005 ml  Net 235 ml   Filed Weights   09/09/24 0602 09/10/24 0522 09/11/24 0524  Weight: 68.6 kg 68.8 kg 68.6 kg    Telemetry    NSR with PVCs - Personally Reviewed  ECG    Not done today - Personally Reviewed  Physical Exam   Physical Exam Vitals and nursing note reviewed.  Constitutional:      Appearance: Normal appearance.  HENT:     Head: Normocephalic and atraumatic.  Eyes:     Conjunctiva/sclera: Conjunctivae normal.  Neck:     Vascular: Hepatojugular reflux and JVD present.  Cardiovascular:     Rate and Rhythm: Normal rate and regular rhythm.     Heart sounds: Murmur heard.  Pulmonary:     Effort: Pulmonary effort is normal.     Breath sounds: Examination of the left-middle field reveals rales. Examination of the  right-lower field reveals rales. Examination of the left-lower field reveals rales. Rales present.  Musculoskeletal:        General: No swelling or tenderness.  Skin:    Coloration: Skin is not jaundiced or pale.  Neurological:     Mental Status: He is alert.      Labs    Chemistry Recent Labs  Lab 09/06/24 2019 09/07/24 0133 09/09/24 0226 09/09/24 1040 09/09/24 1042 09/10/24 0320 09/11/24 0214  NA 134*   < > 134*   < > 135 129* 131*  K 4.6   < > 3.9   < > 3.8 4.1 4.3  CL 102   < > 101  --   --  100 100  CO2 21*   < > 20*  --   --  20* 20*  GLUCOSE 112*   < > 103*  --   --  117* 113*  BUN 22   < > 27*  --   --  23 22  CREATININE 1.57*   < > 1.59*  --   --  1.56* 1.50*  CALCIUM 9.4   < > 8.6*  --   --  8.5* 9.2  PROT 6.4*  --   --   --   --   --   --  ALBUMIN 3.8  --   --   --   --   --   --   AST 19  --   --   --   --   --   --   ALT 18  --   --   --   --   --   --   ALKPHOS 64  --   --   --   --   --   --   BILITOT 1.1  --   --   --   --   --   --   GFRNONAA 41*   < > 41*  --   --  42* 44*  ANIONGAP 11   < > 13  --   --  9 11   < > = values in this interval not displayed.     Hematology Recent Labs  Lab 09/06/24 1530 09/06/24 2019 09/07/24 0133 09/09/24 1040 09/09/24 1042  WBC 8.1 8.8 8.6  --   --   RBC 4.60 4.33 4.32  --   --   HGB 14.7 13.8 13.7 12.6* 12.6*  HCT 42.8 40.9 40.2 37.0* 37.0*  MCV 93.2 94.5 93.1  --   --   MCH  --  31.9 31.7  --   --   MCHC 34.2 33.7 34.1  --   --   RDW 13.1 12.6 12.7  --   --   PLT 267.0 273 266  --   --     Cardiac EnzymesNo results for input(s): TROPONINI in the last 168 hours. No results for input(s): TROPIPOC in the last 168 hours.   BNP Recent Labs  Lab 09/06/24 1530 09/06/24 2019  BNP  --  1,123.1*  PROBNP 6,771*  --      DDimer No results for input(s): DDIMER in the last 168 hours.   Radiology    CARDIAC CATHETERIZATION Result Date: 09/09/2024 Conclusions: Severe multivessel coronary artery  disease, as detailed below, including 80-90% ostial LAD stenosis, chronic total occlusion of the ramus intermedius, and 80-90% in-stent restenosis at the distal margin of proximal/mid RCA stent. Normal left and right heart filling pressures. Mild pulmonary hypertension. Mildly-moderately reduced Fick cardiac output/index. Recommendations: Continue workup of severe aortic stenosis, mitral regurgitation, and cardiomyopathy per structural heart team. Aggressive secondary prevention of coronary artery disease. Maintain net even fluid balance with escalation of goal-directed medical therapy as tolerated. Lonni Hanson, MD [facility]   Cardiac Studies   [facility] 09/09/2024: Conclusions: Severe multivessel coronary artery disease including 80-90% ostial LAD stenosis, chronic total occlusion of the ramus intermedius, and 80-90% in-stent restenosis at the distal margin of proximal/mid RCA stent. Normal left and right heart filling pressures. Mild pulmonary hypertension. Mildly-moderately reduced Fick cardiac output/index.  Patient Profile     89 y.o. male who is active and lives independently with a past medical history of CAD s/p PCI to RCA in 2012, DM, hypertension, hyperlipidemia, hypothyroidism, severe aortic stenosis who was consulted by Dr. Fairy for evaluation of CHF/AAS on 09/07/24.  In July he was seen by Dr. Verlin for evaluation of asymptomatic severe aortic stenosis and was felt he could be a good candidate for TAVR at that time.  He began having shortness of breath, orthopnea and PND and saw his PCP who found a small right-sided pleural effusion with atelectasis CXR and sent him to the ED for further evaluation.  He was noted to be in acute systolic heart failure upon presentation and was  diuresed.  He was consulted on 11/4.  An echocardiogram showed an ejection fraction of 40 to 45% with normal RV, mid to distal lateral wall, posterior and apical septal hypokinesis, moderate to severe MR,  severe aortic stenosis with mean gradient 35 mmHg.  He underwent right and left heart catheterization in preparation for TAVR on 09/09/2024 with Dr. Mady with results as above.    His complex case was discussed with the cardiac multidisciplinary team that he is a highly functional 89 year old male with severe two-vessel CAD involving the ostium of the LAD and mid RCA with acute HFrEF and severe aortic stenosis he was discussed and he is not deemed a candidate for cardiac surgery (AVR/CABG).  After extensive discussion with the team and patient, plan is to pursue treatment with TAVR and PCI.  Assessment & Plan   Acute HFrEF- volume up today on exam - worsening shortness of breath, rales on exam, JVD/HJR to mid neck and shortness of breath with minimal position changes. Holding GDMT due to renal function and acute HF. Will give lasix 40 mg IV x 1 Severe aortic stenosis with mild aortic insufficiency- outpatient workup for AVR Moderate-severe mitral regurgitation-  Severe multivessel CAD- on aspirin 81 mg and Plavix  75 mg, atorvastatin 40 mg.  Multidisciplinary cardiac discussion with plan for supported PCI with IABP or Impella of LAD and RCA and likely atherectomy of proximal LAD and stenting extending into the left main.  IABP assisted atherectomy and PCI coordinated with Dr. Darron for Wednesday 11/12. If medically stable beforehand, he can be discharged and return for the procedure.  AKI CKD 3 - improving.  Has been off diuretics for several days Hypertension- Home amlodipine  and losartan  on hold to avoid hypotension      For questions or updates, please contact [facility] Please consult [email or web address] for contact info under        Signed, Emeline Calender, DO 09/11/2024, 7:14 AM

## 2024-09-11 NOTE — Progress Notes (Signed)
 PROGRESS NOTE    [name]  FMW:[id number] DOB: 11/07/1929 DOA: 09/06/2024 PCP: [name], NP  89/M with history of CAD, severe aortic stenosis, hypertension, dyslipidemia, hypothyroidism, CKD 3B presented to the ED with worsening dyspnea on exertion and orthopnea for 4 to 5 days. - In the ER he was tachypneic, BNP greater than 1100, chest x-ray with moderate left and small right pleural effusion, interstitial edema, troponin of 36.  Admitted, started on diuretics - Improved with diuresis, echo noted EF down to 40-45 with wall motion abnormality, moderate to severe MR and severe aortic stenosis - R/LHC 11/6 noted multivessel CAD, almost normal filling pressures - Seen by T CTS, declined CABG, plan for PCI followed by TAVR  Subjective: - Feels well, denies any dyspnea this morning  Assessment and Plan:  Acute on chronic combined CHF  severe aortic stenosis - Last echo 6/25 noted EF 55-60%, normal RV, severe aortic stenosis - Repeat echo yesterday noted EF of 40-45% with wall motion abnormality, normal RV, moderate to severe MR and severe aortic stenosis with mean gradient of 35 mmHg - Improved with diuresis, diuretics held for cath [facility] with improved filling pressures, severe multivessel CAD including 80 to 90% ostial LAD stenosis, total occlusion of ramus intermedius, 80 to 90% in-stent stenosis of proximal/mid RCA stent -Seen by T CTS, plan to proceed with PCI, followed by TAVR -Per cardiology  Elevated troponin, demand ischemia Multivessel CAD -As noted above, continue Plavix  and statin  History of CAD -Concern for ischemic cardiomyopathy considering wall motion abnormalities, continue Plavix  and statin - Multivessel CAD as noted above  History of CKD 3B - Now improving  Hypothyroidism Continue Synthroid   DVT prophylaxis: Lovenox Code Status: Full code Family Communication: None present Disposition Plan: Home later today or in a.m.  Consultants:  Cards   Procedures:   Antimicrobials:    Objective: Vitals:   09/11/24 0007 09/11/24 0516 09/11/24 0524 09/11/24 0715  BP: (!) 115/57 (!) 123/55  112/60  Pulse: 85 70  71  Resp: 18 (!) 22  20  Temp: 97.7 F (36.5 C) 97.7 F (36.5 C)  (!) 97.4 F (36.3 C)  TempSrc: Oral Oral  Oral  SpO2: 94% 95%  92%  Weight:   68.6 kg   Height:        Intake/Output Summary (Last 24 hours) at 09/11/2024 1101 Last data filed at 09/11/2024 0800 Gross per 24 hour  Intake 1240 ml  Output 880 ml  Net 360 ml   Filed Weights   09/09/24 0602 09/10/24 0522 09/11/24 0524  Weight: 68.6 kg 68.8 kg 68.6 kg    Examination:  General exam: Appears calm and comfortable, AAO x 3 Respiratory system: Decreased at the bases Cardiovascular system: S1 & S2 heard, RRR.  Systolic murmur Abd: nondistended, soft and nontender.Normal bowel sounds heard. Central nervous system: Alert and oriented. No focal neurological deficits. Extremities: Trace edema Skin: No rashes Psychiatry:  Mood & affect appropriate.     Data Reviewed:   CBC: Recent Labs  Lab 09/06/24 1530 09/06/24 2019 09/07/24 0133 09/09/24 1040 09/09/24 1042  WBC 8.1 8.8 8.6  --   --   NEUTROABS 5.6 5.5  --   --   --   HGB 14.7 13.8 13.7 12.6* 12.6*  HCT 42.8 40.9 40.2 37.0* 37.0*  MCV 93.2 94.5 93.1  --   --   PLT 267.0 273 266  --   --    Basic Metabolic Panel: Recent Labs  Lab  09/07/24 0133 09/08/24 0323 09/09/24 0226 09/09/24 1040 09/09/24 1042 09/10/24 0320 09/11/24 0214  NA 134* 136 134* 125* 135 129* 131*  K 4.0 4.3 3.9 3.5 3.8 4.1 4.3  CL 104 99 101  --   --  100 100  CO2 19* 21* 20*  --   --  20* 20*  GLUCOSE 180* 115* 103*  --   --  117* 113*  BUN 23 28* 27*  --   --  23 22  CREATININE 1.60* 1.94* 1.59*  --   --  1.56* 1.50*  CALCIUM 9.1 9.6 8.6*  --   --  8.5* 9.2  MG 2.0  --   --   --   --   --   --   PHOS 3.0  --   --   --   --   --   --    GFR: Estimated Creatinine Clearance: 31.1 mL/min (A) (by C-G  formula based on SCr of 1.5 mg/dL (H)). Liver Function Tests: Recent Labs  Lab 09/06/24 2019  AST 19  ALT 18  ALKPHOS 64  BILITOT 1.1  PROT 6.4*  ALBUMIN 3.8   No results for input(s): LIPASE, AMYLASE in the last 168 hours. No results for input(s): AMMONIA in the last 168 hours. Coagulation Profile: No results for input(s): INR, PROTIME in the last 168 hours. Cardiac Enzymes: No results for input(s): CKTOTAL, CKMB, CKMBINDEX, TROPONINI in the last 168 hours. BNP (last 3 results) Recent Labs    09/06/24 1530  PROBNP 6,771*   HbA1C: No results for input(s): HGBA1C in the last 72 hours. CBG: No results for input(s): GLUCAP in the last 168 hours. Lipid Profile: No results for input(s): CHOL, HDL, LDLCALC, TRIG, CHOLHDL, LDLDIRECT in the last 72 hours.  Thyroid  Function Tests: No results for input(s): TSH, T4TOTAL, FREET4, T3FREE, THYROIDAB in the last 72 hours. Anemia Panel: No results for input(s): VITAMINB12, FOLATE, FERRITIN, TIBC, IRON, RETICCTPCT in the last 72 hours. Urine analysis:    Component Value Date/Time   COLORURINE YELLOW 06/26/2024 0233   APPEARANCEUR CLEAR 06/26/2024 0233   LABSPEC 1.018 06/26/2024 0233   PHURINE 5.0 06/26/2024 0233   GLUCOSEU NEGATIVE 06/26/2024 0233   HGBUR NEGATIVE 06/26/2024 0233   BILIRUBINUR NEGATIVE 06/26/2024 0233   KETONESUR NEGATIVE 06/26/2024 0233   PROTEINUR NEGATIVE 06/26/2024 0233   NITRITE NEGATIVE 06/26/2024 0233   LEUKOCYTESUR NEGATIVE 06/26/2024 0233   Sepsis Labs: @LABRCNTIP (procalcitonin:4,lacticidven:4)  )No results found for this or any previous visit (from the past 240 hours).   Radiology Studies: No results found.    Scheduled Meds:  aspirin EC  81 mg Oral Daily   atorvastatin  40 mg Oral Daily   clopidogrel   75 mg Oral Daily   enoxaparin (LOVENOX) injection  40 mg Subcutaneous Q24H   furosemide  40 mg Intravenous Once   levothyroxine   88 mcg  Oral Q0600   multivitamin with minerals  1 tablet Oral Daily   omega-3 acid ethyl esters  1 g Oral Daily   sodium chloride  flush  3 mL Intravenous Q12H   Continuous Infusions:     LOS: 5 days    Time spent:    Sigurd Pac, MD [facility]   09/11/2024, 11:01 AM

## 2024-09-11 NOTE — Plan of Care (Signed)
   Problem: Education: Goal: Knowledge of General Education information will improve Description: Including pain rating scale, medication(s)/side effects and non-pharmacologic comfort measures Outcome: Progressing   Problem: Clinical Measurements: Goal: Cardiovascular complication will be avoided Outcome: Progressing

## 2024-09-12 DIAGNOSIS — I5023 Acute on chronic systolic (congestive) heart failure: Secondary | ICD-10-CM | POA: Diagnosis not present

## 2024-09-12 LAB — BASIC METABOLIC PANEL WITH GFR
Anion gap: 13 (ref 5–15)
BUN: 20 mg/dL (ref 8–23)
CO2: 22 mmol/L (ref 22–32)
Calcium: 8.8 mg/dL — ABNORMAL LOW (ref 8.9–10.3)
Chloride: 94 mmol/L — ABNORMAL LOW (ref 98–111)
Creatinine, Ser: 1.45 mg/dL — ABNORMAL HIGH (ref 0.61–1.24)
GFR, Estimated: 45 mL/min — ABNORMAL LOW (ref >60)
Glucose, Bld: 114 mg/dL — ABNORMAL HIGH (ref 70–99)
Potassium: 3.8 mmol/L (ref 3.5–5.1)
Sodium: 129 mmol/L — ABNORMAL LOW (ref 135–145)

## 2024-09-12 LAB — LIPOPROTEIN A (LPA): Lipoprotein (a): 9.9 nmol/L (ref <75.0)

## 2024-09-12 MED ORDER — ATORVASTATIN CALCIUM 40 MG PO TABS: 40.0000 mg | ORAL_TABLET | Freq: Every day | ORAL | 0 refills | Status: DC

## 2024-09-12 MED ORDER — FUROSEMIDE 20 MG PO TABS: 20.0000 mg | ORAL_TABLET | Freq: Every day | ORAL | 0 refills | Status: DC

## 2024-09-12 MED ORDER — FUROSEMIDE 20 MG PO TABS
20.0000 mg | ORAL_TABLET | Freq: Every day | ORAL | Status: DC
  Administered 2024-09-12: 20 mg via ORAL
  Filled 2024-09-12: qty 1

## 2024-09-12 MED ORDER — POTASSIUM CHLORIDE CRYS ER 10 MEQ PO TBCR: 10.0000 meq | EXTENDED_RELEASE_TABLET | Freq: Two times a day (BID) | ORAL | 0 refills | Status: DC

## 2024-09-12 MED ORDER — FUROSEMIDE 40 MG PO TABS: 40.0000 mg | ORAL_TABLET | Freq: Every day | ORAL | Status: DC

## 2024-09-12 MED ORDER — ASPIRIN 81 MG PO TBEC: 81.0000 mg | DELAYED_RELEASE_TABLET | Freq: Every day | ORAL | 0 refills | Status: AC

## 2024-09-12 NOTE — Plan of Care (Signed)
   Problem: Education: Goal: Knowledge of General Education information will improve Description: Including pain rating scale, medication(s)/side effects and non-pharmacologic comfort measures Outcome: Progressing   Problem: Clinical Measurements: Goal: Cardiovascular complication will be avoided Outcome: Progressing

## 2024-09-12 NOTE — Progress Notes (Signed)
 Discharge   Patient expressed verbal understanding of discharge POC.   Patient given time to ask any questions.  Additional education included in AVS.  Alert oriented in good spirits.   Tele and PIV removed.  CCMD notified by Primary RN.  Pressure dressings intact.  Transferring to [facility] on the way

## 2024-09-12 NOTE — Progress Notes (Addendum)
 Progress Note  Patient Name: [name] Date of Encounter: 09/12/2024  Primary Cardiologist: Lonni Cash, MD   Subjective   Feeling much better today after lasix.   Inpatient Medications    Scheduled Meds:  aspirin EC  81 mg Oral Daily   atorvastatin  40 mg Oral Daily   clopidogrel   75 mg Oral Daily   enoxaparin (LOVENOX) injection  40 mg Subcutaneous Q24H   levothyroxine   88 mcg Oral Q0600   multivitamin with minerals  1 tablet Oral Daily   omega-3 acid ethyl esters  1 g Oral Daily   sodium chloride  flush  3 mL Intravenous Q12H   Continuous Infusions:  PRN Meds: acetaminophen , melatonin, polyethylene glycol, prochlorperazine, sodium chloride  flush   Vital Signs    Vitals:   09/12/24 0008 09/12/24 0011 09/12/24 0458 09/12/24 0609  BP: (!) 111/36 (!) 105/56 120/61   Pulse: 71  62   Resp: 15  20   Temp: 97.6 F (36.4 C)  (!) 97.5 F (36.4 C)   TempSrc: Oral  Oral   SpO2: 94%  95%   Weight:    68 kg  Height:        Intake/Output Summary (Last 24 hours) at 09/12/2024 0717 Last data filed at 09/12/2024 0501 Gross per 24 hour  Intake 460 ml  Output 1140 ml  Net -680 ml   Filed Weights   09/10/24 0522 09/11/24 0524 09/12/24 0609  Weight: 68.8 kg 68.6 kg 68 kg    Telemetry    NSR with PVCs - Personally reviewed  ECG    Not done today - Personally Reviewed  Physical Exam   Physical Exam Vitals and nursing note reviewed.  Constitutional:      Appearance: Normal appearance.  HENT:     Head: Normocephalic and atraumatic.  Eyes:     Conjunctiva/sclera: Conjunctivae normal.  Neck:     Vascular: No carotid bruit.  Cardiovascular:     Rate and Rhythm: Normal rate and regular rhythm.     Heart sounds: Murmur heard.  Pulmonary:     Effort: Pulmonary effort is normal.     Breath sounds: Normal breath sounds.  Musculoskeletal:        General: No swelling or tenderness.  Skin:    Coloration: Skin is not jaundiced or pale.   Neurological:     Mental Status: He is alert.      Labs    Chemistry Recent Labs  Lab 09/06/24 2019 09/07/24 0133 09/10/24 0320 09/11/24 0214 09/12/24 0211  NA 134*   < > 129* 131* 129*  K 4.6   < > 4.1 4.3 3.8  CL 102   < > 100 100 94*  CO2 21*   < > 20* 20* 22  GLUCOSE 112*   < > 117* 113* 114*  BUN 22   < > 23 22 20   CREATININE 1.57*   < > 1.56* 1.50* 1.45*  CALCIUM 9.4   < > 8.5* 9.2 8.8*  PROT 6.4*  --   --   --   --   ALBUMIN 3.8  --   --   --   --   AST 19  --   --   --   --   ALT 18  --   --   --   --   ALKPHOS 64  --   --   --   --   BILITOT 1.1  --   --   --   --  GFRNONAA 41*   < > 42* 44* 45*  ANIONGAP 11   < > 9 11 13    < > = values in this interval not displayed.     Hematology Recent Labs  Lab 09/06/24 1530 09/06/24 2019 09/07/24 0133 09/09/24 1040 09/09/24 1042  WBC 8.1 8.8 8.6  --   --   RBC 4.60 4.33 4.32  --   --   HGB 14.7 13.8 13.7 12.6* 12.6*  HCT 42.8 40.9 40.2 37.0* 37.0*  MCV 93.2 94.5 93.1  --   --   MCH  --  31.9 31.7  --   --   MCHC 34.2 33.7 34.1  --   --   RDW 13.1 12.6 12.7  --   --   PLT 267.0 273 266  --   --     Cardiac EnzymesNo results for input(s): TROPONINI in the last 168 hours. No results for input(s): TROPIPOC in the last 168 hours.   BNP Recent Labs  Lab 09/06/24 1530 09/06/24 2019  BNP  --  1,123.1*  PROBNP 6,771*  --      DDimer No results for input(s): DDIMER in the last 168 hours.   Radiology    No results found.   Cardiac Studies   [facility] 09/09/2024: Conclusions: Severe multivessel coronary artery disease including 80-90% ostial LAD stenosis, chronic total occlusion of the ramus intermedius, and 80-90% in-stent restenosis at the distal margin of proximal/mid RCA stent. Normal left and right heart filling pressures. Mild pulmonary hypertension. Mildly-moderately reduced Fick cardiac output/index.  Patient Profile     89 y.o. male who is active and lives independently with a past  medical history of CAD s/p PCI to RCA in 2012, DM, hypertension, hyperlipidemia, hypothyroidism, severe aortic stenosis who was consulted by Dr. Fairy for evaluation of CHF/AAS on 09/07/24.  In July he was seen by Dr. Verlin for evaluation of asymptomatic severe aortic stenosis and was felt he could be a good candidate for TAVR at that time.  He began having shortness of breath, orthopnea and PND and saw his PCP who found a small right-sided pleural effusion with atelectasis CXR and sent him to the ED for further evaluation.  He was noted to be in acute systolic heart failure upon presentation and was diuresed.  He was consulted on 11/4.  An echocardiogram showed an ejection fraction of 40 to 45% with normal RV, mid to distal lateral wall, posterior and apical septal hypokinesis, moderate to severe MR, severe aortic stenosis with mean gradient 35 mmHg.  He underwent right and left heart catheterization in preparation for TAVR on 09/09/2024 with Dr. Mady with results as above.    His complex case was discussed with the cardiac multidisciplinary team that he is a highly functional 89 year old male with severe two-vessel CAD involving the ostium of the LAD and mid RCA with acute HFrEF and severe aortic stenosis he was discussed and he is not deemed a candidate for cardiac surgery (AVR/CABG).  After extensive discussion with the team and patient, plan is to pursue treatment with TAVR and PCI.  Assessment & Plan   Acute HFrEF- euvolemic today. Holding GDMT due to renal function and to avoid hypotension. Can be discharged on lasix 20 mg daily PO Severe aortic stenosis with mild aortic insufficiency- outpatient workup for AVR Moderate-severe mitral regurgitation-  Severe multivessel CAD- on aspirin 81 mg and Plavix  75 mg, atorvastatin 40 mg.  Multidisciplinary cardiac discussion with plan for supported PCI with IABP or  Impella of LAD and RCA and likely atherectomy of proximal LAD and stenting extending into the  left main.  IABP assisted atherectomy and PCI coordinated by Dr. Wonda with Dr. Darron for Wednesday 11/12. AKI CKD 3 - improving.   Hypertension- antihypertensives on hold to avoid hypotension  Cardiology will sign off at this time. Patient can be discharged from a cardiac perspective on aspirin 81 mg, Plavix  75 mg, atorvastatin 40 mg and Lasix 20 mg PO daily. He has an intervention planned as above on Wednesday with Dr. Darron.      For questions or updates, please contact [facility] Please consult [email or web address] for contact info under        Signed, Emeline Calender, DO 09/12/2024, 7:17 AM

## 2024-09-12 NOTE — H&P (View-Only) (Signed)
 Progress Note  Patient Name: [name] Date of Encounter: 09/12/2024  Primary Cardiologist: Lonni Cash, MD   Subjective   Feeling much better today after lasix.   Inpatient Medications    Scheduled Meds:  aspirin EC  81 mg Oral Daily   atorvastatin  40 mg Oral Daily   clopidogrel   75 mg Oral Daily   enoxaparin (LOVENOX) injection  40 mg Subcutaneous Q24H   levothyroxine   88 mcg Oral Q0600   multivitamin with minerals  1 tablet Oral Daily   omega-3 acid ethyl esters  1 g Oral Daily   sodium chloride  flush  3 mL Intravenous Q12H   Continuous Infusions:  PRN Meds: acetaminophen , melatonin, polyethylene glycol, prochlorperazine, sodium chloride  flush   Vital Signs    Vitals:   09/12/24 0008 09/12/24 0011 09/12/24 0458 09/12/24 0609  BP: (!) 111/36 (!) 105/56 120/61   Pulse: 71  62   Resp: 15  20   Temp: 97.6 F (36.4 C)  (!) 97.5 F (36.4 C)   TempSrc: Oral  Oral   SpO2: 94%  95%   Weight:    68 kg  Height:        Intake/Output Summary (Last 24 hours) at 09/12/2024 0717 Last data filed at 09/12/2024 0501 Gross per 24 hour  Intake 460 ml  Output 1140 ml  Net -680 ml   Filed Weights   09/10/24 0522 09/11/24 0524 09/12/24 0609  Weight: 68.8 kg 68.6 kg 68 kg    Telemetry    NSR with PVCs - Personally reviewed  ECG    Not done today - Personally Reviewed  Physical Exam   Physical Exam Vitals and nursing note reviewed.  Constitutional:      Appearance: Normal appearance.  HENT:     Head: Normocephalic and atraumatic.  Eyes:     Conjunctiva/sclera: Conjunctivae normal.  Neck:     Vascular: No carotid bruit.  Cardiovascular:     Rate and Rhythm: Normal rate and regular rhythm.     Heart sounds: Murmur heard.  Pulmonary:     Effort: Pulmonary effort is normal.     Breath sounds: Normal breath sounds.  Musculoskeletal:        General: No swelling or tenderness.  Skin:    Coloration: Skin is not jaundiced or pale.   Neurological:     Mental Status: He is alert.      Labs    Chemistry Recent Labs  Lab 09/06/24 2019 09/07/24 0133 09/10/24 0320 09/11/24 0214 09/12/24 0211  NA 134*   < > 129* 131* 129*  K 4.6   < > 4.1 4.3 3.8  CL 102   < > 100 100 94*  CO2 21*   < > 20* 20* 22  GLUCOSE 112*   < > 117* 113* 114*  BUN 22   < > 23 22 20   CREATININE 1.57*   < > 1.56* 1.50* 1.45*  CALCIUM 9.4   < > 8.5* 9.2 8.8*  PROT 6.4*  --   --   --   --   ALBUMIN 3.8  --   --   --   --   AST 19  --   --   --   --   ALT 18  --   --   --   --   ALKPHOS 64  --   --   --   --   BILITOT 1.1  --   --   --   --  GFRNONAA 41*   < > 42* 44* 45*  ANIONGAP 11   < > 9 11 13    < > = values in this interval not displayed.     Hematology Recent Labs  Lab 09/06/24 1530 09/06/24 2019 09/07/24 0133 09/09/24 1040 09/09/24 1042  WBC 8.1 8.8 8.6  --   --   RBC 4.60 4.33 4.32  --   --   HGB 14.7 13.8 13.7 12.6* 12.6*  HCT 42.8 40.9 40.2 37.0* 37.0*  MCV 93.2 94.5 93.1  --   --   MCH  --  31.9 31.7  --   --   MCHC 34.2 33.7 34.1  --   --   RDW 13.1 12.6 12.7  --   --   PLT 267.0 273 266  --   --     Cardiac EnzymesNo results for input(s): TROPONINI in the last 168 hours. No results for input(s): TROPIPOC in the last 168 hours.   BNP Recent Labs  Lab 09/06/24 1530 09/06/24 2019  BNP  --  1,123.1*  PROBNP 6,771*  --      DDimer No results for input(s): DDIMER in the last 168 hours.   Radiology    No results found.   Cardiac Studies   [facility] 09/09/2024: Conclusions: Severe multivessel coronary artery disease including 80-90% ostial LAD stenosis, chronic total occlusion of the ramus intermedius, and 80-90% in-stent restenosis at the distal margin of proximal/mid RCA stent. Normal left and right heart filling pressures. Mild pulmonary hypertension. Mildly-moderately reduced Fick cardiac output/index.  Patient Profile     89 y.o. male who is active and lives independently with a past  medical history of CAD s/p PCI to RCA in 2012, DM, hypertension, hyperlipidemia, hypothyroidism, severe aortic stenosis who was consulted by Dr. Fairy for evaluation of CHF/AAS on 09/07/24.  In July he was seen by Dr. Verlin for evaluation of asymptomatic severe aortic stenosis and was felt he could be a good candidate for TAVR at that time.  He began having shortness of breath, orthopnea and PND and saw his PCP who found a small right-sided pleural effusion with atelectasis CXR and sent him to the ED for further evaluation.  He was noted to be in acute systolic heart failure upon presentation and was diuresed.  He was consulted on 11/4.  An echocardiogram showed an ejection fraction of 40 to 45% with normal RV, mid to distal lateral wall, posterior and apical septal hypokinesis, moderate to severe MR, severe aortic stenosis with mean gradient 35 mmHg.  He underwent right and left heart catheterization in preparation for TAVR on 09/09/2024 with Dr. Mady with results as above.    His complex case was discussed with the cardiac multidisciplinary team that he is a highly functional 89 year old male with severe two-vessel CAD involving the ostium of the LAD and mid RCA with acute HFrEF and severe aortic stenosis he was discussed and he is not deemed a candidate for cardiac surgery (AVR/CABG).  After extensive discussion with the team and patient, plan is to pursue treatment with TAVR and PCI.  Assessment & Plan   Acute HFrEF- euvolemic today. Holding GDMT due to renal function and to avoid hypotension. Can be discharged on lasix 20 mg daily PO Severe aortic stenosis with mild aortic insufficiency- outpatient workup for AVR Moderate-severe mitral regurgitation-  Severe multivessel CAD- on aspirin 81 mg and Plavix  75 mg, atorvastatin 40 mg.  Multidisciplinary cardiac discussion with plan for supported PCI with IABP or  Impella of LAD and RCA and likely atherectomy of proximal LAD and stenting extending into the  left main.  IABP assisted atherectomy and PCI coordinated by Dr. Wonda with Dr. Darron for Wednesday 11/12. AKI CKD 3 - improving.   Hypertension- antihypertensives on hold to avoid hypotension  Cardiology will sign off at this time. Patient can be discharged from a cardiac perspective on aspirin 81 mg, Plavix  75 mg, atorvastatin 40 mg and Lasix 20 mg PO daily. He has an intervention planned as above on Wednesday with Dr. Darron.      For questions or updates, please contact [facility] Please consult [email or web address] for contact info under        Signed, Emeline Calender, DO 09/12/2024, 7:17 AM

## 2024-09-13 ENCOUNTER — Telehealth: Payer: Self-pay | Admitting: *Deleted

## 2024-09-13 ENCOUNTER — Telehealth: Payer: Self-pay

## 2024-09-13 NOTE — Transitions of Care (Post Inpatient/ED Visit) (Signed)
   09/13/2024  Name: [name] MRN: [id number] DOB: 11/07/1929  Today's TOC FU Call Status: Today's TOC FU Call Status:: Unsuccessful Call (1st Attempt) Unsuccessful Call (1st Attempt) Date: 09/13/24  Attempted to reach the patient regarding the most recent Inpatient/ED visit. Spoke with patient by phone number provided in demographics per DPR. States, I'm just getting awake can you call later today or tomorrow?   Follow Up Plan: Additional outreach attempts will be made to reach the patient to complete the Transitions of Care (Post Inpatient/ED visit) call.   Richerd Fish, RN, BSN, CCM [facility], [facility] Management Coordinator Direct Dial: [phone]

## 2024-09-13 NOTE — Telephone Encounter (Signed)
 Per Dr Wonda- 3 hours of pre-procedure hydration at 75 ml/hr.

## 2024-09-13 NOTE — Telephone Encounter (Addendum)
 Coronary Stent scheduled at [facility] for: Wednesday September 15, 2024 10:30 AM Arrival time [facility] Main Entrance A at: 6:30 AM-pre-procedure This will allow time for 1 hour registration/IV start and 3 hours of IVF at 75 ml/hr (per Dr Wonda)  Diet: -Nothing to eat after midnight.  Hydration: -May drink clear liquids until 2 hours before the procedure.  Approved liquids: Water, clear tea, black coffee, fruit juices-non-citric and without pulp,Gatorade, plain Jello/popsicles.  No PO hydration (16 oz water)- going in early for IVF  Medication instructions: -Hold:  Lasix/KCl-day before and day of procedure -per protocol GFR <60 (45) -Other usual morning medications can be taken including aspirin 81 mg and Plavix  75 mg.  Plan to go home the same day, you will only stay overnight if medically necessary.  You must have responsible adult to drive you home.  Someone must be with you the first 24 hours after you arrive home.  Reviewed procedure instructions with patient.

## 2024-09-14 ENCOUNTER — Telehealth: Payer: Self-pay

## 2024-09-14 NOTE — Transitions of Care (Post Inpatient/ED Visit) (Signed)
 09/14/2024  Name: [name] MRN: [id number] DOB: [birth year]  Today's TOC FU Call Status: Today's TOC FU Call Status:: Successful TOC FU Call Completed TOC FU Call Complete Date: 09/14/24  Patient's Name and Date of Birth confirmed. Name, DOB  Transition Care Management Follow-up Telephone Call Date of Discharge: 09/12/24 Discharge Facility: [facility]) Type of Discharge: Inpatient Admission Primary Inpatient Discharge Diagnosis:: Acute on chronic congestive heart failure How have you been since you were released from the hospital?: Better Any questions or concerns?: No  Items Reviewed: Did you receive and understand the discharge instructions provided?: Yes Medications obtained,verified, and reconciled?: Yes (Medications Reviewed) Any new allergies since your discharge?: No Dietary orders reviewed?: Yes Type of Diet Ordered:: low sodium heart healthy Do you have support at home?: Yes People in Home [RPT]: alone (has dog -Ceasar) Name of Support/Comfort Primary Source: [name], daughter  Medications Reviewed Today:  Reviewed with AVS today Medications Reviewed Today     Reviewed by Eilleen Richerd GRADE, RN (Registered Nurse) on 09/14/24 at 1355  Med List Status: <None>   Medication Order Taking? Sig Documenting Provider Last Dose Status Informant  acetaminophen  (TYLENOL ) 500 MG tablet [id number] Yes Take 1,000 mg by mouth every 6 (six) hours as needed for mild pain (pain score 1-3) or headache. [provider]  Active Self, Pharmacy Records  aspirin EC 81 MG tablet [id number] Yes Take 1 tablet (81 mg total) by mouth daily. Swallow whole. [name], MD  Active Self, Pharmacy Records  atorvastatin (LIPITOR) 40 MG tablet [id number] Yes Take 1 tablet (40 mg total) by mouth daily. [name], MD  Active Self, Pharmacy Records  clopidogrel  (PLAVIX ) 75 MG tablet [id number] Yes Take 1 tablet (75 mg total) by mouth daily. [name], [name], MD  Active Self,  Pharmacy Records  fish oil-omega-3 fatty acids 1000 MG capsule [id number] Yes Take 1 g by mouth daily. [provider]  Active Self, Pharmacy Records  furosemide (LASIX) 20 MG tablet [id number]  Take 1 tablet (20 mg total) by mouth daily.  Patient not taking: Reported on 09/14/2024   [name], MD  Active Self, Pharmacy Records  levothyroxine  (SYNTHROID ) 88 MCG tablet [id number] Yes Take 1 tablet (88 mcg total) by mouth daily before breakfast. Nche, [name], NP  Active Self, Pharmacy Records  Misc Natural Products (OSTEO BI-FLEX ADV TRIPLE ST) TABS [id number] Yes Take 1 tablet by mouth in the morning. [provider]  Active Self, Pharmacy Records  Multiple Vitamins-Minerals (CENTRUM SILVER MEN 50+) TABS [id number] Yes Take 1 tablet by mouth in the morning. [provider]  Active Self, Pharmacy Records  potassium chloride (KLOR-CON M) 10 MEQ tablet [id number] Yes Take 1 tablet (10 mEq total) by mouth 2 (two) times daily. [name], MD  Active Self, Pharmacy Records            Home Care and Equipment/Supplies: Were Home Health Services Ordered?: No Any new equipment or medical supplies ordered?: No  Functional Questionnaire: Do you need assistance with bathing/showering or dressing?: No Do you need assistance with meal preparation?: No Do you have difficulty maintaining continence: No Do you need assistance with getting out of bed/getting out of a chair/moving?: No Do you have difficulty managing or taking your medications?: No  Follow up appointments reviewed: PCP Follow-up appointment confirmed?: No (Patient prefers to call PCP after cath appointment tomorrow 09/15/24) Specialist Hospital Follow-up appointment confirmed?: Yes Date of Specialist follow-up appointment?: 09/15/24 Follow-Up Specialty Provider::  Cardiology Do you need transportation to your follow-up appointment?: No Do you understand care options if your condition(s) worsen?:  Yes-patient verbalized understanding    Goals Addressed             This Visit's Progress    VBCI Transitions of Care (TOC) Care Plan       Problems:  Recent Hospitalization for treatment of CHF No Hospital Follow Up Provider appointment Patient declines to have appointment booked today due to wants to see the outcome of Cardiac Catheterization scheduled for 09/15/24  Goal:  Over the next 30 days, the patient will not experience hospital readmission  Interventions:   Heart Failure Interventions: Provided education on low sodium diet Advised patient to weigh each morning after emptying bladder Discussed importance of daily weight and advised patient to weigh and record daily Reviewed role of diuretics in prevention of fluid overload and management of heart failure; Discussed the importance of keeping all appointments with provider Screening for signs and symptoms of depression related to chronic disease state  Patient encouraged to review instructions for cardiac cath tomorrow, he said he needs to look at it again.  Patient Self Care Activities:  Attend all scheduled provider appointments Call pharmacy for medication refills 3-7 days in advance of running out of medications Call provider office for new concerns or questions  Notify RN Care Manager of TOC call rescheduling needs Participate in Transition of Care Program/Attend TOC scheduled calls Take medications as prescribed    Plan:  The patient has been provided with contact information for the care management team and has been advised to call with any health related questions or concerns.  Discussed and offered 30 day TOC program.  Patient agrees to 30 day TOC telephonic follow up.  The patient has been provided with contact information for the care management team and has been advised to call with any health -related questions or concerns.  The patient verbalized understanding with current plan of care.             Richerd Fish, RN, BSN, CCM [facility], [facility] Management Coordinator Direct Dial: [phone]

## 2024-09-14 NOTE — Patient Instructions (Addendum)
 Visit Information  Thank you for taking time to visit with me today. Please don't hesitate to contact me if I can be of assistance to you before our next scheduled telephone appointment.  Our next appointment is by telephone on 09/22/24 at 11:00 AM  Following is a copy of your care plan:   Goals Addressed             This Visit's Progress    VBCI Transitions of Care (TOC) Care Plan       Problems:  Recent Hospitalization for treatment of CHF No Hospital Follow Up Provider appointment Patient declines to have appointment booked today due to wants to see the outcome of Cardia Catheterization scheduled for 09/15/24  Goal:  Over the next 30 days, the patient will not experience hospital readmission  Interventions:   Heart Failure Interventions: Provided education on low sodium diet Advised patient to weigh each morning after emptying bladder Discussed importance of daily weight and advised patient to weigh and record daily Reviewed role of diuretics in prevention of fluid overload and management of heart failure; Discussed the importance of keeping all appointments with provider Screening for signs and symptoms of depression related to chronic disease state   Patient Self Care Activities:  Attend all scheduled provider appointments Call pharmacy for medication refills 3-7 days in advance of running out of medications Call provider office for new concerns or questions  Notify RN Care Manager of TOC call rescheduling needs Participate in Transition of Care Program/Attend TOC scheduled calls Take medications as prescribed    Plan:  The patient has been provided with contact information for the care management team and has been advised to call with any health related questions or concerns.  Discussed and offered 30 day TOC program.  Patient agrees to 30 day TOC telephonic follow up.  The patient has been provided with contact information for the care management team and has been  advised to call with any health -related questions or concerns.  The patient verbalized understanding with current plan of care.           Patient verbalizes understanding of instructions and care plan provided today and agrees to view in MyChart. Active MyChart status and patient understanding of how to access instructions and care plan via MyChart confirmed with patient.     The patient has been provided with contact information for the care management team and has been advised to call with any health related questions or concerns.   Please call the care guide team at [phone] if you need to cancel or reschedule your appointment.   Please call the [place]  National Suicide Prevention Lifeline: 223-399-1484 or TTY: (639) 164-2628 TTY (213) 826-7427) to talk to a trained counselor if you are experiencing a Mental Health or Behavioral Health Crisis or need someone to talk to.  Richerd Fish, RN, BSN, CCM [facility], [facility] Management Coordinator Direct Dial: [phone]

## 2024-09-14 NOTE — Discharge Summary (Signed)
 Physician Discharge Summary   Patient: [name] MRN: [id number] DOB: 22-Aug-1930  Admit date:     09/06/2024  Discharge date: 09/12/2024  Discharge Physician: [name]  PCP: Gregory [name], NP  Recommendations at discharge: Follow-up with PCP in 1 week. Follow-up with cardiology as recommended.   Follow-up Information     [name], [name], NP Follow up on 09/15/2024.   Specialty: Internal Medicine Why: 11 am for hospital follow up, please arrive 15 mins early Contact information: [address] [place] [address] [phone]         Gregory Deatrice LABOR, MD. Call on 09/14/2024.   Specialty: Cardiology Why: to confirm instruction for cardiac catheterization. Contact information: [address] [place] [address] [phone]                Hospital Course: Acute on chronic combined CHF  severe aortic stenosis Last echo 6/25 noted EF 55-60%, normal RV, severe aortic stenosis Repeat echo noted EF of 40-45% with wall motion abnormality, normal RV, moderate to severe MR and severe aortic stenosis with mean gradient of 35 mmHg Improved with diuresis, diuretics held for cath [facility] with improved filling pressures, severe multivessel CAD including 80 to 90% ostial LAD stenosis, total occlusion of ramus intermedius, 80 to 90% in-stent stenosis of proximal/mid RCA stent Seen by T CTS, plan to proceed with PCI, followed by TAVR Per cardiology   Elevated troponin, demand ischemia Multivessel CAD As noted above, continue Plavix  and statin   History of CAD Concern for ischemic cardiomyopathy considering wall motion abnormalities, continue Plavix  and statin Multivessel CAD as noted above   History of CKD 3B Now improving   Hypothyroidism Continue Synthroid   Consultants:  Cardiology   Procedures performed:  Cardiac Catheterization  DISCHARGE MEDICATION: Allergies as of 09/12/2024   No Known Allergies      Medication  List     STOP taking these medications    amLODipine  5 MG tablet Commonly known as: NORVASC    losartan  50 MG tablet Commonly known as: COZAAR    pravastatin  40 MG tablet Commonly known as: PRAVACHOL        TAKE these medications    acetaminophen  500 MG tablet Commonly known as: TYLENOL  Take 1,000 mg by mouth every 6 (six) hours as needed for mild pain (pain score 1-3) or headache.   aspirin EC 81 MG tablet Take 1 tablet (81 mg total) by mouth daily. Swallow whole.   atorvastatin 40 MG tablet Commonly known as: LIPITOR Take 1 tablet (40 mg total) by mouth daily.   Centrum Silver Men 50+ Tabs Take 1 tablet by mouth in the morning.   clopidogrel  75 MG tablet Commonly known as: PLAVIX  Take 1 tablet (75 mg total) by mouth daily.   fish oil-omega-3 fatty acids 1000 MG capsule Take 1 g by mouth daily.   furosemide 20 MG tablet Commonly known as: LASIX Take 1 tablet (20 mg total) by mouth daily.   levothyroxine  88 MCG tablet Commonly known as: SYNTHROID  Take 1 tablet (88 mcg total) by mouth daily before breakfast.   Osteo Bi-Flex Adv Triple St Tabs Take 1 tablet by mouth in the morning.   potassium chloride 10 MEQ tablet Commonly known as: KLOR-CON M Take 1 tablet (10 mEq total) by mouth 2 (two) times daily.       Disposition: Home Diet recommendation: Cardiac diet  Discharge Exam: Vitals:   09/12/24 0011 09/12/24 0458 09/12/24 0609 09/12/24 0817  BP: (!) 105/56 120/61  ROLLEN)  129/56  Pulse:  62  62  Resp:  20  (!) 22  Temp:  (!) 97.5 F (36.4 C)  97.8 F (36.6 C)  TempSrc:  Oral  Oral  SpO2:  95%  100%  Weight:   68 kg   Height:       General: in Mild distress, No Rash Cardiovascular: S1 and S2 Present, aortic systolic  Murmur Respiratory: Good respiratory effort, Bilateral Air entry present. No Crackles, No wheezes Abdomen: Bowel Sound present, No tenderness Extremities: trace edema Neuro: Alert and oriented x3, no new focal deficit Filed Weights    09/10/24 0522 09/11/24 0524 09/12/24 0609  Weight: 68.8 kg 68.6 kg 68 kg   Condition at discharge: stable  The results of significant diagnostics from this hospitalization (including imaging, microbiology, ancillary and laboratory) are listed below for reference.   Imaging Studies: CARDIAC CATHETERIZATION Result Date: 09/09/2024 Conclusions: Severe multivessel coronary artery disease, as detailed below, including 80-90% ostial LAD stenosis, chronic total occlusion of the ramus intermedius, and 80-90% in-stent restenosis at the distal margin of proximal/mid RCA stent. Normal left and right heart filling pressures. Mild pulmonary hypertension. Mildly-moderately reduced Fick cardiac output/index. Recommendations: Continue workup of severe aortic stenosis, mitral regurgitation, and cardiomyopathy per structural heart team. Aggressive secondary prevention of coronary artery disease. Maintain net even fluid balance with escalation of goal-directed medical therapy as tolerated. Gregory Hanson, MD [facility]  ECHOCARDIOGRAM COMPLETE Result Date: 09/07/2024    ECHOCARDIOGRAM REPORT   Patient Name:   [name] [facility] Date of Exam: 09/07/2024 Medical Rec #:  [id number]          Height:       69.0 in Accession #:    [id number]         Weight:       159.0 lb Date of Birth:  January 13, 1930         BSA:          1.874 m Patient Age:    89 years           BP:           120/84 mmHg Patient Gender: M                  HR:           75 bpm. Exam Location:  Inpatient Procedure: 2D Echo (Both Spectral and Color Flow Doppler were utilized during            procedure). Indications:    CHF  History:        Patient has prior history of Echocardiogram examinations. CHF;                 Aortic Valve Disease and Mitral Valve Disease.  Sonographer:    [name] Referring Phys: [id number] [name] IMPRESSIONS  1. Left ventricular ejection fraction, by estimation, is 40 to 45%. Left ventricular ejection fraction by 2D MOD  biplane is 46.1 %. The left ventricle has mildly decreased function. The left ventricle demonstrates regional wall motion abnormalities (see  scoring diagram/findings for description). Left ventricular diastolic parameters are indeterminate.  2. Right ventricular systolic function is normal. The right ventricular size is normal. There is normal pulmonary artery systolic pressure.  3. Left atrial size was severely dilated.  4. The mitral valve is degenerative. Moderate to severe mitral valve regurgitation.  5. The aortic valve is calcified. Aortic valve regurgitation is mild. Severe aortic valve stenosis. Aortic valve area, by VTI  measures 0.86 cm. Aortic valve mean gradient measures 35.0 mmHg. Aortic valve Vmax measures 3.98 m/s. Aortic valve acceleration time measures 127 msec.  6. The inferior vena cava is normal in size with greater than 50% respiratory variability, suggesting right atrial pressure of 3 mmHg. Comparison(s): Mitral regurgitation has increase. Aortic stenosis is still severe. Decrease in LVEF. FINDINGS  Left Ventricle: Left ventricular ejection fraction, by estimation, is 40 to 45%. Left ventricular ejection fraction by 2D MOD biplane is 46.1 %. The left ventricle has mildly decreased function. The left ventricle demonstrates regional wall motion abnormalities. The left ventricular internal cavity size was normal in size. There is no left ventricular hypertrophy. Left ventricular diastolic parameters are indeterminate.  LV Wall Scoring: The mid and distal lateral wall, posterior wall, apical septal segment, apical anterior segment, and apical inferior segment are hypokinetic. Right Ventricle: The right ventricular size is normal. No increase in right ventricular wall thickness. Right ventricular systolic function is normal. There is normal pulmonary artery systolic pressure. The tricuspid regurgitant velocity is 2.35 m/s, and  with an assumed right atrial pressure of 3 mmHg, the estimated right  ventricular systolic pressure is 25.1 mmHg. Left Atrium: Left atrial size was severely dilated. Right Atrium: Right atrial size was normal in size. Pericardium: There is no evidence of pericardial effusion. Mitral Valve: The mitral valve is degenerative in appearance. Moderate to severe mitral valve regurgitation. MV peak gradient, 7.4 mmHg. The mean mitral valve gradient is 3.0 mmHg. Tricuspid Valve: The tricuspid valve is normal in structure. Tricuspid valve regurgitation is trivial. No evidence of tricuspid stenosis. Aortic Valve: The aortic valve is calcified. Aortic valve regurgitation is mild. Aortic regurgitation PHT measures 498 msec. Severe aortic stenosis is present. Aortic valve mean gradient measures 35.0 mmHg. Aortic valve peak gradient measures 63.4 mmHg. Aortic valve area, by VTI measures 0.86 cm. Pulmonic Valve: The pulmonic valve was normal in structure. Pulmonic valve regurgitation is not visualized. No evidence of pulmonic stenosis. Aorta: The aortic root and ascending aorta are structurally normal, with no evidence of dilitation. Venous: The inferior vena cava is normal in size with greater than 50% respiratory variability, suggesting right atrial pressure of 3 mmHg. IAS/Shunts: No atrial level shunt detected by color flow Doppler.  LEFT VENTRICLE PLAX 2D                        Biplane EF (MOD) LVIDd:         5.80 cm         LV Biplane EF:   Left LVIDs:         4.60 cm                          ventricular LV PW:         0.80 cm                          ejection LV IVS:        0.90 cm                          fraction by LVOT diam:     2.00 cm                          2D MOD LV SV:         72  biplane is LV SV Index:   39                               46.1 %. LVOT Area:     3.14 cm                                Diastology                                LV e' medial:    5.61 cm/s LV Volumes (MOD)               LV E/e' medial:  27.5 LV vol d, MOD    170.0 ml      LV  e' lateral:   5.70 cm/s A2C:                           LV E/e' lateral: 27.0 LV vol d, MOD    168.0 ml A4C: LV vol s, MOD    90.9 ml A2C: LV vol s, MOD    79.1 ml A4C: LV SV MOD A2C:   79.1 ml LV SV MOD A4C:   168.0 ml LV SV MOD BP:    78.0 ml RIGHT VENTRICLE             IVC RV Basal diam:  3.10 cm     IVC diam: 1.80 cm RV S prime:     11.40 cm/s TAPSE (M-mode): 2.1 cm      PULMONARY VEINS                             Diastolic Velocity: 87.10 cm/s                             S/D Velocity:       0.30                             Systolic Velocity:  22.70 cm/s LEFT ATRIUM              Index        RIGHT ATRIUM           Index LA diam:        3.60 cm  1.92 cm/m   RA Area:     14.00 cm LA Vol (A2C):   106.0 ml 56.56 ml/m  RA Volume:   34.60 ml  18.46 ml/m LA Vol (A4C):   76.4 ml  40.77 ml/m LA Biplane Vol: 95.7 ml  51.07 ml/m  AORTIC VALVE                     PULMONIC VALVE AV Area (Vmax):    0.83 cm      PV Vmax:       0.83 m/s AV Area (Vmean):   0.83 cm      PV Peak grad:  2.7 mmHg AV Area (VTI):     0.86 cm AV Vmax:           398.00 cm/s AV Vmean:          266.600 cm/s AV  VTI:            0.844 m AV Peak Grad:      63.4 mmHg AV Mean Grad:      35.0 mmHg LVOT Vmax:         105.00 cm/s LVOT Vmean:        70.467 cm/s LVOT VTI:          0.231 m LVOT/AV VTI ratio: 0.27 AI PHT:            498 msec  AORTA Ao Root diam: 3.00 cm Ao Asc diam:  2.70 cm MITRAL VALVE                  TRICUSPID VALVE MV Area (PHT): 3.28 cm       TR Peak grad:   22.1 mmHg MV Area VTI:   1.99 cm       TR Vmax:        235.00 cm/s MV Peak grad:  7.4 mmHg MV Mean grad:  3.0 mmHg       SHUNTS MV Vmax:       1.36 m/s       Systemic VTI:  0.23 m MV Vmean:      74.7 cm/s      Systemic Diam: 2.00 cm MV Decel Time: 231 msec MR Peak grad:    92.2 mmHg MR Mean grad:    57.0 mmHg MR Vmax:         480.00 cm/s MR Vmean:        358.0 cm/s MR PISA:         1.57 cm MR PISA Eff ROA: 10 mm MR PISA Radius:  0.50 cm MV E velocity: 154.00 cm/s MV A velocity:  49.40 cm/s MV E/A ratio:  3.12 Stanly Leavens MD Electronically signed by Stanly Leavens MD Signature Date/Time: 09/07/2024/2:30:33 PM    Final    DG Chest 2 View Result Date: 09/06/2024 EXAM: 2 VIEW(S) XRAY OF THE CHEST 09/06/2024 03:51:25 PM COMPARISON: 06/25/2024 CLINICAL HISTORY: Shortness of breath with exertion. FINDINGS: LUNGS AND PLEURA: Moderate left and small right pleural effusions with bibasilar atelectasis, new from previous exam. Increased interstitial markings compatible with mild edema. No pneumothorax. HEART AND MEDIASTINUM: Aortic atherosclerosis. No acute abnormality of the cardiac and mediastinal silhouettes. BONES AND SOFT TISSUES: Polyarticular degenerative change. No acute osseous abnormality. IMPRESSION: 1. Moderate left and small right pleural effusions with bibasilar atelectasis, new from prior exam. 2. Mild interstitial pulmonary edema. Electronically signed by: Waddell Calk MD 09/06/2024 04:32 PM EST RP Workstation: [id number]    Microbiology: Results for orders placed or performed during the hospital encounter of 12/12/22  Surgical pcr screen     Status: Abnormal   Collection Time: 12/12/22 10:10 AM   Specimen: Nasal Mucosa; Nasal Swab  Result Value Ref Range Status   MRSA, PCR NEGATIVE NEGATIVE Final   Staphylococcus aureus POSITIVE (A) NEGATIVE Final    Comment: (NOTE) The Xpert SA Assay (FDA approved for NASAL specimens in patients 68 years of age and older), is one component of a comprehensive surveillance program. It is not intended to diagnose infection nor to guide or monitor treatment. Performed at [facility], [address] [place], [place] [address]    Labs: CBC: Recent Labs  Lab 09/09/24 1040 09/09/24 1042  HGB 12.6* 12.6*  HCT 37.0* 37.0*   Basic Metabolic Panel: Recent Labs  Lab 09/08/24 0323 09/09/24 0226 09/09/24 1040 09/09/24 1042 09/10/24 0320 09/11/24 0214 09/12/24 0211  NA 136 134* 125* 135 129* 131* 129*   K 4.3 3.9 3.5 3.8 4.1 4.3 3.8  CL 99 101  --   --  100 100 94*  CO2 21* 20*  --   --  20* 20* 22  GLUCOSE 115* 103*  --   --  117* 113* 114*  BUN 28* 27*  --   --  23 22 20   CREATININE 1.94* 1.59*  --   --  1.56* 1.50* 1.45*  CALCIUM 9.6 8.6*  --   --  8.5* 9.2 8.8*   Liver Function Tests: No results for input(s): AST, ALT, ALKPHOS, BILITOT, PROT, ALBUMIN in the last 168 hours. CBG: No results for input(s): GLUCAP in the last 168 hours.  Discharge time spent: greater than 30 minutes.  Author: Yetta Blanch, MD  [facility] 09/12/2024

## 2024-09-15 ENCOUNTER — Other Ambulatory Visit: Payer: Self-pay

## 2024-09-15 ENCOUNTER — Inpatient Hospital Stay (HOSPITAL_COMMUNITY)
Admission: RE | Admit: 2024-09-15 | Discharge: 2024-09-16 | DRG: 270 | Disposition: A | Discharge status: Home / Self Care | Attending: Cardiovascular Disease | Admitting: Cardiovascular Disease

## 2024-09-15 ENCOUNTER — Encounter (HOSPITAL_COMMUNITY): Payer: Self-pay | Admitting: Cardiovascular Disease

## 2024-09-15 ENCOUNTER — Encounter: Admission: RE | Payer: Self-pay | Attending: Cardiovascular Disease

## 2024-09-15 ENCOUNTER — Inpatient Hospital Stay: Admitting: Nurse Practitioner

## 2024-09-15 DIAGNOSIS — I2089 Other forms of angina pectoris: Secondary | ICD-10-CM | POA: Diagnosis present

## 2024-09-15 DIAGNOSIS — I080 Rheumatic disorders of both mitral and aortic valves: Secondary | ICD-10-CM | POA: Diagnosis not present

## 2024-09-15 DIAGNOSIS — I5021 Acute systolic (congestive) heart failure: Secondary | ICD-10-CM | POA: Diagnosis present

## 2024-09-15 DIAGNOSIS — I2584 Coronary atherosclerosis due to calcified coronary lesion: Secondary | ICD-10-CM | POA: Diagnosis not present

## 2024-09-15 DIAGNOSIS — E1122 Type 2 diabetes mellitus with diabetic chronic kidney disease: Secondary | ICD-10-CM | POA: Diagnosis present

## 2024-09-15 DIAGNOSIS — I2510 Atherosclerotic heart disease of native coronary artery without angina pectoris: Principal | ICD-10-CM

## 2024-09-15 DIAGNOSIS — I130 Hypertensive heart and chronic kidney disease with heart failure and stage 1 through stage 4 chronic kidney disease, or unspecified chronic kidney disease: Secondary | ICD-10-CM | POA: Diagnosis present

## 2024-09-15 DIAGNOSIS — Z7982 Long term (current) use of aspirin: Secondary | ICD-10-CM

## 2024-09-15 DIAGNOSIS — Z7902 Long term (current) use of antithrombotics/antiplatelets: Secondary | ICD-10-CM

## 2024-09-15 DIAGNOSIS — N183 Chronic kidney disease, stage 3 unspecified: Secondary | ICD-10-CM | POA: Diagnosis not present

## 2024-09-15 DIAGNOSIS — N179 Acute kidney failure, unspecified: Secondary | ICD-10-CM | POA: Diagnosis present

## 2024-09-15 DIAGNOSIS — Z79899 Other long term (current) drug therapy: Secondary | ICD-10-CM | POA: Diagnosis not present

## 2024-09-15 DIAGNOSIS — I272 Pulmonary hypertension, unspecified: Secondary | ICD-10-CM | POA: Diagnosis present

## 2024-09-15 DIAGNOSIS — I493 Ventricular premature depolarization: Secondary | ICD-10-CM | POA: Diagnosis present

## 2024-09-15 DIAGNOSIS — Z955 Presence of coronary angioplasty implant and graft: Secondary | ICD-10-CM

## 2024-09-15 DIAGNOSIS — I25110 Atherosclerotic heart disease of native coronary artery with unstable angina pectoris: Secondary | ICD-10-CM | POA: Diagnosis not present

## 2024-09-15 DIAGNOSIS — E039 Hypothyroidism, unspecified: Secondary | ICD-10-CM | POA: Diagnosis present

## 2024-09-15 DIAGNOSIS — I25119 Atherosclerotic heart disease of native coronary artery with unspecified angina pectoris: Secondary | ICD-10-CM | POA: Diagnosis not present

## 2024-09-15 DIAGNOSIS — I350 Nonrheumatic aortic (valve) stenosis: Secondary | ICD-10-CM | POA: Diagnosis not present

## 2024-09-15 DIAGNOSIS — E785 Hyperlipidemia, unspecified: Secondary | ICD-10-CM | POA: Diagnosis not present

## 2024-09-15 HISTORY — PX: CORONARY IMAGING/OCT: CATH118326

## 2024-09-15 HISTORY — PX: CORONARY STENT INTERVENTION: CATH118234

## 2024-09-15 HISTORY — PX: CORONARY ATHERECTOMY: CATH118238

## 2024-09-15 HISTORY — PX: IABP INSERTION: CATH118242

## 2024-09-15 LAB — CBC
HCT: 42.5 % (ref 39.0–52.0)
HCT: 41.6 % (ref 39.0–52.0)
Hemoglobin: 14.3 g/dL (ref 13.0–17.0)
Hemoglobin: 14 g/dL (ref 13.0–17.0)
MCH: 31.8 pg (ref 26.0–34.0)
MCH: 31.3 pg (ref 26.0–34.0)
MCHC: 33.6 g/dL (ref 30.0–36.0)
MCHC: 33.7 g/dL (ref 30.0–36.0)
MCV: 94.7 fL (ref 80.0–100.0)
MCV: 92.9 fL (ref 80.0–100.0)
Platelets: 322 K/uL (ref 150–400)
Platelets: 279 K/uL (ref 150–400)
RBC: 4.49 MIL/uL (ref 4.22–5.81)
RBC: 4.48 MIL/uL (ref 4.22–5.81)
RDW: 12.8 % (ref 11.5–15.5)
RDW: 12.8 % (ref 11.5–15.5)
WBC: 9.2 K/uL (ref 4.0–10.5)
WBC: 10 K/uL (ref 4.0–10.5)
nRBC: 0 % (ref 0.0–0.2)
nRBC: 0 % (ref 0.0–0.2)

## 2024-09-15 LAB — CREATININE, SERUM
Creatinine, Ser: 1.57 mg/dL — ABNORMAL HIGH (ref 0.61–1.24)
GFR, Estimated: 41 mL/min — ABNORMAL LOW (ref >60)

## 2024-09-15 SURGERY — CORONARY STENT INTERVENTION
Anesthesia: LOCAL

## 2024-09-15 MED ORDER — ASPIRIN 81 MG PO TBEC
81.0000 mg | DELAYED_RELEASE_TABLET | Freq: Every day | ORAL | Status: DC
  Administered 2024-09-16: 81 mg via ORAL
  Filled 2024-09-15: qty 1

## 2024-09-15 MED ORDER — FUROSEMIDE 20 MG PO TABS
20.0000 mg | ORAL_TABLET | Freq: Every day | ORAL | Status: DC
  Administered 2024-09-16: 20 mg via ORAL
  Filled 2024-09-15: qty 1

## 2024-09-15 MED ORDER — CLOPIDOGREL BISULFATE 75 MG PO TABS: 75.0000 mg | ORAL_TABLET | ORAL | Status: DC

## 2024-09-15 MED ORDER — MIDAZOLAM HCL 2 MG/2ML IJ SOLN
INTRAMUSCULAR | Status: AC
  Filled 2024-09-15: qty 2

## 2024-09-15 MED ORDER — IOHEXOL 350 MG/ML SOLN
INTRAVENOUS | Status: DC | PRN
  Administered 2024-09-15: 105 mL

## 2024-09-15 MED ORDER — ACETAMINOPHEN 325 MG PO TABS
650.0000 mg | ORAL_TABLET | ORAL | Status: DC | PRN
Start: 2024-09-15 — End: 2024-09-16
  Administered 2024-09-16: 650 mg via ORAL
  Filled 2024-09-15: qty 2

## 2024-09-15 MED ORDER — ATORVASTATIN CALCIUM 40 MG PO TABS
40.0000 mg | ORAL_TABLET | Freq: Every day | ORAL | Status: DC
  Administered 2024-09-16: 40 mg via ORAL
  Filled 2024-09-15: qty 1

## 2024-09-15 MED ORDER — OSTEO BI-FLEX ADV TRIPLE ST PO TABS: 1.0000 | ORAL_TABLET | Freq: Every morning | ORAL | Status: DC

## 2024-09-15 MED ORDER — SODIUM CHLORIDE 0.9% FLUSH: 3.0000 mL | INTRAVENOUS | Status: DC | PRN

## 2024-09-15 MED ORDER — HEPARIN SODIUM (PORCINE) 1000 UNIT/ML IJ SOLN
INTRAMUSCULAR | Status: AC
  Filled 2024-09-15: qty 10

## 2024-09-15 MED ORDER — ADULT MULTIVITAMIN W/MINERALS CH
1.0000 | ORAL_TABLET | Freq: Every morning | ORAL | Status: DC
  Administered 2024-09-16: 1 via ORAL
  Filled 2024-09-15: qty 1

## 2024-09-15 MED ORDER — HEPARIN SODIUM (PORCINE) 1000 UNIT/ML IJ SOLN
INTRAMUSCULAR | Status: DC | PRN
  Administered 2024-09-15: 8000 [IU] via INTRAVENOUS
  Administered 2024-09-15: 3000 [IU] via INTRAVENOUS

## 2024-09-15 MED ORDER — LIDOCAINE HCL (PF) 1 % IJ SOLN
INTRAMUSCULAR | Status: AC
  Filled 2024-09-15: qty 30

## 2024-09-15 MED ORDER — OMEGA-3 FATTY ACIDS 1000 MG PO CAPS: 1.0000 g | ORAL_CAPSULE | Freq: Every day | ORAL | Status: DC

## 2024-09-15 MED ORDER — SODIUM CHLORIDE 0.9 % IV SOLN: INTRAVENOUS | Status: AC

## 2024-09-15 MED ORDER — LEVOTHYROXINE SODIUM 88 MCG PO TABS
88.0000 ug | ORAL_TABLET | Freq: Every day | ORAL | Status: DC
  Administered 2024-09-16: 88 ug via ORAL
  Filled 2024-09-15: qty 1

## 2024-09-15 MED ORDER — CLOPIDOGREL BISULFATE 300 MG PO TABS
ORAL_TABLET | ORAL | Status: AC
  Filled 2024-09-15: qty 1

## 2024-09-15 MED ORDER — CLOPIDOGREL BISULFATE 75 MG PO TABS
75.0000 mg | ORAL_TABLET | Freq: Every day | ORAL | Status: DC
  Administered 2024-09-16: 75 mg via ORAL
  Filled 2024-09-15: qty 1

## 2024-09-15 MED ORDER — ENOXAPARIN SODIUM 40 MG/0.4ML IJ SOSY
40.0000 mg | PREFILLED_SYRINGE | INTRAMUSCULAR | Status: DC
  Administered 2024-09-16: 40 mg via SUBCUTANEOUS
  Filled 2024-09-15: qty 0.4

## 2024-09-15 MED ORDER — SODIUM CHLORIDE 0.9 % IV SOLN: INTRAVENOUS | Status: DC

## 2024-09-15 MED ORDER — MIDAZOLAM HCL (PF) 2 MG/2ML IJ SOLN
INTRAMUSCULAR | Status: DC | PRN
  Administered 2024-09-15: 1 mg via INTRAVENOUS

## 2024-09-15 MED ORDER — HEPARIN (PORCINE) IN NACL 1000-0.9 UT/500ML-% IV SOLN
INTRAVENOUS | Status: DC | PRN
  Administered 2024-09-15 (×2): 500 mL

## 2024-09-15 MED ORDER — ASPIRIN 81 MG PO CHEW: 81.0000 mg | CHEWABLE_TABLET | ORAL | Status: DC

## 2024-09-15 MED ORDER — SODIUM CHLORIDE 0.9% FLUSH
3.0000 mL | Freq: Two times a day (BID) | INTRAVENOUS | Status: DC
  Administered 2024-09-15 – 2024-09-16 (×2): 3 mL via INTRAVENOUS

## 2024-09-15 MED ORDER — SODIUM CHLORIDE 0.9 % IV SOLN
INTRAVENOUS | Status: DC | PRN
  Administered 2024-09-15: 10 mL/h via INTRAVENOUS

## 2024-09-15 MED ORDER — VERAPAMIL HCL 2.5 MG/ML IV SOLN
INTRAVENOUS | Status: AC
  Filled 2024-09-15: qty 2

## 2024-09-15 MED ORDER — CLOPIDOGREL BISULFATE 300 MG PO TABS
ORAL_TABLET | ORAL | Status: DC | PRN
  Administered 2024-09-15: 300 mg via ORAL

## 2024-09-15 MED ORDER — POTASSIUM CHLORIDE CRYS ER 20 MEQ PO TBCR
10.0000 meq | EXTENDED_RELEASE_TABLET | Freq: Two times a day (BID) | ORAL | Status: DC
  Administered 2024-09-16: 10 meq via ORAL
  Filled 2024-09-15: qty 1

## 2024-09-15 MED ORDER — VERAPAMIL HCL 2.5 MG/ML IV SOLN
INTRAVENOUS | Status: DC | PRN
  Administered 2024-09-15: 10 mL via INTRA_ARTERIAL

## 2024-09-15 MED ORDER — ONDANSETRON HCL 4 MG/2ML IJ SOLN: 4.0000 mg | Freq: Four times a day (QID) | INTRAMUSCULAR | Status: DC | PRN

## 2024-09-15 MED ORDER — LIDOCAINE HCL (PF) 1 % IJ SOLN
INTRAMUSCULAR | Status: DC | PRN
  Administered 2024-09-15: 5 mL via INTRADERMAL
  Administered 2024-09-15: 10 mL via INTRADERMAL

## 2024-09-15 MED ORDER — SODIUM CHLORIDE 0.9 % IV SOLN: 250.0000 mL | INTRAVENOUS | Status: DC | PRN

## 2024-09-15 SURGICAL SUPPLY — 24 items
BALLOON IABP SENS PLUS 8F 50CC (BALLOONS) IMPLANT
BALLOON SAPPHIRE 2.5X12 (BALLOONS) IMPLANT
BALLOON SAPPHIRE NC24 3.75X8 (BALLOONS) IMPLANT
CATH DRAGONFLY OPSTAR (CATHETERS) IMPLANT
CATH VISTA GUIDE 6FR XBLD 3.5 (CATHETERS) IMPLANT
CLOSURE PERCLOSE PROSTYLE (Vascular Products) IMPLANT
CROWN DIAMONDBACK CLASSIC 1.25 (BURR) IMPLANT
DEVICE CLOSURE MYNXGRIP 5F (Vascular Products) IMPLANT
DEVICE RAD COMP TR BAND LRG (VASCULAR PRODUCTS) IMPLANT
ELECT DEFIB PAD ADLT CADENCE (PAD) IMPLANT
GLIDESHEATH SLEND SS 6F .021 (SHEATH) IMPLANT
GUIDEWIRE INQWIRE 1.5J.035X260 (WIRE) IMPLANT
KIT ENCORE 26 ADVANTAGE (KITS) IMPLANT
KIT ESSENTIALS PG (KITS) IMPLANT
KIT MICROPUNCTURE NIT STIFF (SHEATH) IMPLANT
KIT SYRINGE INJ CVI SPIKEX1 (MISCELLANEOUS) IMPLANT
LUBRICANT VIPERSLIDE CORONARY (MISCELLANEOUS) IMPLANT
PACK CARDIAC CATHETERIZATION (CUSTOM PROCEDURE TRAY) ×2 IMPLANT
SHEATH PINNACLE 5F 10CM (SHEATH) IMPLANT
SHEATH PINNACLE 7F 10CM (SHEATH) IMPLANT
STENT SYNERGY XD 3.50X16 (Permanent Stent) IMPLANT
WIRE RUNTHROUGH .014X180CM (WIRE) IMPLANT
WIRE RUNTHROUGH IZANAI 014 180 (WIRE) IMPLANT
WIRE VIPERWIRE COR FLEX .012 (WIRE) IMPLANT

## 2024-09-15 NOTE — Interval H&P Note (Signed)
 History and Physical Interval Note:  09/15/2024 10:38 AM  [name]  has presented today for surgery, with the diagnosis of cad.  The various methods of treatment have been discussed with the patient and family. After consideration of risks, benefits and other options for treatment, the patient has consented to  Procedure(s): CORONARY STENT INTERVENTION (N/A) as a surgical intervention.  The patient's history has been reviewed, patient examined, no change in status, stable for surgery.  I have reviewed the patient's chart and labs.  Questions were answered to the patient's satisfaction.     Zuhayr Deeney

## 2024-09-16 ENCOUNTER — Encounter (HOSPITAL_COMMUNITY): Payer: Self-pay | Admitting: Cardiovascular Disease

## 2024-09-16 DIAGNOSIS — I350 Nonrheumatic aortic (valve) stenosis: Secondary | ICD-10-CM

## 2024-09-16 DIAGNOSIS — I25110 Atherosclerotic heart disease of native coronary artery with unstable angina pectoris: Secondary | ICD-10-CM | POA: Diagnosis not present

## 2024-09-16 LAB — CBC
HCT: 38.9 % — ABNORMAL LOW (ref 39.0–52.0)
Hemoglobin: 13.3 g/dL (ref 13.0–17.0)
MCH: 31.4 pg (ref 26.0–34.0)
MCHC: 34.2 g/dL (ref 30.0–36.0)
MCV: 92 fL (ref 80.0–100.0)
Platelets: 276 K/uL (ref 150–400)
RBC: 4.23 MIL/uL (ref 4.22–5.81)
RDW: 12.9 % (ref 11.5–15.5)
WBC: 11.3 K/uL — ABNORMAL HIGH (ref 4.0–10.5)
nRBC: 0 % (ref 0.0–0.2)

## 2024-09-16 LAB — BASIC METABOLIC PANEL WITH GFR
Anion gap: 10 (ref 5–15)
BUN: 23 mg/dL (ref 8–23)
CO2: 19 mmol/L — ABNORMAL LOW (ref 22–32)
Calcium: 8.8 mg/dL — ABNORMAL LOW (ref 8.9–10.3)
Chloride: 104 mmol/L (ref 98–111)
Creatinine, Ser: 1.6 mg/dL — ABNORMAL HIGH (ref 0.61–1.24)
GFR, Estimated: 40 mL/min — ABNORMAL LOW (ref >60)
Glucose, Bld: 124 mg/dL — ABNORMAL HIGH (ref 70–99)
Potassium: 4 mmol/L (ref 3.5–5.1)
Sodium: 133 mmol/L — ABNORMAL LOW (ref 135–145)

## 2024-09-16 LAB — POCT ACTIVATED CLOTTING TIME
Activated Clotting Time: 250 s
Activated Clotting Time: 700 s
Activated Clotting Time: 239 s

## 2024-09-16 MED ORDER — MELATONIN 3 MG PO TABS: 3.0000 mg | ORAL_TABLET | Freq: Every day | ORAL | Status: DC

## 2024-09-16 NOTE — TOC Transition Note (Signed)
 Transition of Care [facility]) - Discharge Note   Patient Details  Name: [name] MRN: [id number] Date of Birth: 1930/05/17  [facility] CM/SW Contact:  Corean JAYSON Canary, RN Phone Number: 09/16/2024, 11:14 AM   Clinical Narrative:    Patient will be discharging today. He had a LAD DES yesterday. Initially were thinking of a staged PCI to [facility]< they are treating medically for now and will continue OP workup for TAVR    Final next level of care: Home/Self Care Barriers to Discharge: No Barriers Identified   Patient Goals and CMS Choice            Discharge Placement                       Discharge Plan and Services Additional resources added to the After Visit Summary for                                       Social Drivers of Health (SDOH) Interventions SDOH Screenings   Food Insecurity: Patient Declined (09/15/2024)  Housing: Patient Declined (09/15/2024)  Transportation Needs: Patient Declined (09/15/2024)  Utilities: Patient Declined (09/15/2024)  Alcohol Screen: Low Risk  (12/01/2023)  Depression (PHQ2-9): Low Risk  (12/01/2023)  Financial Resource Strain: Low Risk  (12/01/2023)  Physical Activity: Sufficiently Active (12/01/2023)  Social Connections: Unknown (09/15/2024)  Recent Concern: Social Connections - Moderately Isolated (06/26/2024)  Stress: No Stress Concern Present (12/01/2023)  Tobacco Use: Medium Risk (09/15/2024)  Health Literacy: Adequate Health Literacy (12/01/2023)     Readmission Risk Interventions    09/08/2024   10:09 AM  Readmission Risk Prevention Plan  Transportation Screening Complete  PCP or Specialist Appt within 5-7 Days Complete  Home Care Screening Complete  Medication Review (RN CM) Complete

## 2024-09-16 NOTE — Discharge Summary (Signed)
 Discharge Summary   Patient ID: [name] MRN: [id number]; DOB: 1930/09/01  Admit date: 09/15/2024 Discharge date: 09/16/2024  PCP:  [name], NP   [facility] Providers Cardiologist:  Lonni Cash, MD     Discharge Diagnoses  Principal Problem:   Effort angina Active Problems:   Severe aortic stenosis   Diagnostic Studies/Procedures   Cath: 09/15/2024    Dist LM lesion is 30% stenosed.   Mid RCA to Dist RCA lesion is 85% stenosed.   Ramus lesion is 100% stenosed.   1st Diag lesion is 30% stenosed.   Ost Cx to Prox Cx lesion is 50% stenosed.   Ost LAD lesion is 85% stenosed.   A drug-eluting stent was successfully placed using a STENT SYNERGY XD 3.50X16.   Post intervention, there is a 0% residual stenosis.   Post intervention, there is a 0% residual stenosis.   In the absence of any other complications or medical issues, we expect the patient to be ready for discharge from an interventional cardiology perspective on 09/16/2024.   1.  Successful high risk OCT guided orbital atherectomy of the ostial LAD Gregory distal left main with drug-eluting stent placement extending all the way back to the ostium of the left main.  The ostium of the left circumflex appeared disease initially but no critical stenosis was noted after atherectomy.  Thus, I did not have to treat the left circumflex.  This was done with intra-aortic balloon pump support.  The intra-aortic balloon pump was removed at the end of the case Gregory the site was already preclosed with 1 Pro-glide device.   Recommendations: Monitor overnight given high risk procedure.  Consider staged RCA PCI or proceeding directly with TAVR.  Diagnostic Dominance: Right  Intervention   _____________   History of Present Illness   [name] is a 89 y.o. male with a past medical history of CAD s/p PCI to RCA in 2012, DM, hypertension, hyperlipidemia, hypothyroidism, severe aortic  stenosis who was consulted by Dr. Fairy for evaluation of CHF/AAS on 09/07/24.  In July he was seen by Dr. Cash for evaluation of asymptomatic severe aortic stenosis Gregory was felt he could be a good candidate for TAVR at that time.  He began having shortness of breath, orthopnea Gregory PND Gregory saw his PCP who found a small right-sided pleural effusion with atelectasis CXR Gregory sent him to the ED for further evaluation.  He was noted to be in acute systolic heart failure upon presentation Gregory was diuresed.  He was consulted on 11/4.  An echocardiogram showed an ejection fraction of 40 to 45% with normal RV, mid to distal lateral wall, posterior Gregory apical septal hypokinesis, moderate to severe MR, severe aortic stenosis with mean gradient 35 mmHg.  He underwent right Gregory left heart catheterization in preparation for TAVR on 09/09/2024 with Dr. Mady with results as above.     His complex case was discussed with the cardiac multidisciplinary team that he is a highly functional 89 year old male with severe two-vessel CAD involving the ostium of the LAD Gregory mid RCA with acute HFrEF Gregory severe aortic stenosis he was discussed Gregory he is not deemed a candidate for cardiac surgery (AVR/CABG).  After extensive discussion with the team Gregory patient, plan was to pursue treatment with TAVR Gregory PCI.   Hospital Course    Underwent cardiac 11/12 with successful high risk OCT guided orbital atherectomy of the oLAD with stent placement back into the ostium of  the left main with IABP support. oLCX appeared disease initially but no critical stenosis was noted after atherectomy Gregory this was not treated. Does have residual disease of the RCA, but will undergo TAVR initially Gregory follow up regarding symptoms to determine if RCA needs treatment afterwards. Continue ASA Gregory plavix .   General: Well developed, well nourished, male appearing in no acute distress. Head: Normocephalic, atraumatic.  Neck: Supple without bruits, JVD. Lungs:   Resp regular Gregory unlabored, CTA. Heart: RRR, S1, S2, no S3, S4, or murmur; no rub. Abdomen: Soft, non-tender, non-distended with normoactive bowel sounds.  Extremities: No clubbing, cyanosis, edema. Distal pedal pulses are 2+ bilaterally. Right femoral cath site stable without bruising or hematoma Neuro: Alert Gregory oriented X 3. Moves all extremities spontaneously. Psych: Normal affect.  Patient was seen by Dr. Verlin Gregory deemed stable for discharge home. Follow up arranged/TAVR scans by structural team.   Did the patient have an acute coronary syndrome (MI, NSTEMI, STEMI, etc) this admission?:  No                               Did the patient have a percutaneous coronary intervention (stent / angioplasty)?:  Yes.     Cath/PCI Registry Performance & Quality Measures: Aspirin prescribed? - Yes ADP Receptor Inhibitor (Plavix /Clopidogrel , Brilinta/Ticagrelor or Effient/Prasugrel) prescribed (includes medically managed patients)? - Yes High Intensity Statin (Lipitor 40-80mg  or Crestor 20-40mg ) prescribed? - Yes For EF <40%, was ACEI/ARB prescribed? - Not Applicable (EF >/= 40%) For EF <40%, Aldosterone Antagonist (Spironolactone or Eplerenone) prescribed? - Not Applicable (EF >/= 40%) Cardiac Rehab Phase II ordered? - Yes       The patient will be scheduled for a TOC follow up appointment in 10-14 days.  A message has been sent to the [facility] Scheduling Pool at the office where the patient should be seen for follow up.  _____________  Discharge Vitals Blood pressure (!) 133/55, pulse 74, temperature (!) 97.3 F (36.3 C), temperature source Axillary, resp. rate (!) 24, height 5' 9 (1.753 m), weight 69.9 kg, SpO2 98%.  Filed Weights   09/15/24 0732  Weight: 69.9 kg    Labs & Radiologic Studies  CBC Recent Labs    09/15/24 1754 09/16/24 0419  WBC 10.0 11.3*  HGB 14.0 13.3  HCT 41.6 38.9*  MCV 92.9 92.0  PLT 279 276   Basic Metabolic Panel Recent Labs    88/87/74 1754  09/16/24 0419  NA  --  133*  K  --  4.0  CL  --  104  CO2  --  19*  GLUCOSE  --  124*  BUN  --  23  CREATININE 1.57* 1.60*  CALCIUM  --  8.8*   Liver Function Tests No results for input(s): AST, ALT, ALKPHOS, BILITOT, PROT, ALBUMIN in the last 72 hours. No results for input(s): LIPASE, AMYLASE in the last 72 hours. High Sensitivity Troponin:   Recent Labs  Lab 09/06/24 2019 09/06/24 2326 09/07/24 0133  TROPONINIHS 36* 38* 37*    No results for input(s): TRNPT in the last 720 hours.  BNP Invalid input(s): POCBNP No results for input(s): PROBNP in the last 72 hours.  No results for input(s): BNP in the last 72 hours.  D-Dimer No results for input(s): DDIMER in the last 72 hours. Hemoglobin A1C No results for input(s): HGBA1C in the last 72 hours. Fasting Lipid Panel No results for input(s): CHOL, HDL,  LDLCALC, TRIG, CHOLHDL, LDLDIRECT in the last 72 hours. Lipoprotein (a)  Date/Time Value Ref Range Status  09/10/2024 03:20 AM 9.9 <75.0 nmol/L Final    Comment:    (NOTE) This test was developed Gregory its performance characteristics determined by [facility]. It has not been cleared or approved by the Food Gregory Drug Administration. Note:  Values greater than or equal to 75.0 nmol/L may       indicate an independent risk factor for CHD,       but must be evaluated with caution when applied       to non-Caucasian populations due to the       influence of genetic factors on Lp(a) across       ethnicities. Performed At: [facility], [place] [address] [place], [place] [id number] Jennette Shorter MD Ey:[phone]     Thyroid  Function Tests No results for input(s): TSH, T4TOTAL, T3FREE, THYROIDAB in the last 72 hours.  Invalid input(s): FREET3 _____________  CARDIAC CATHETERIZATION Result Date: 09/15/2024   Dist LM lesion is 30% stenosed.   Mid RCA to Dist RCA lesion is 85% stenosed.   Ramus lesion is 100% stenosed.   1st  Diag lesion is 30% stenosed.   Ost Cx to Prox Cx lesion is 50% stenosed.   Ost LAD lesion is 85% stenosed.   A drug-eluting stent was successfully placed using a STENT SYNERGY XD 3.50X16.   Post intervention, there is a 0% residual stenosis.   Post intervention, there is a 0% residual stenosis.   In the absence of any other complications or medical issues, we expect the patient to be ready for discharge from an interventional cardiology perspective on 09/16/2024. 1.  Successful high risk OCT guided orbital atherectomy of the ostial LAD Gregory distal left main with drug-eluting stent placement extending all the way back to the ostium of the left main.  The ostium of the left circumflex appeared disease initially but no critical stenosis was noted after atherectomy.  Thus, I did not have to treat the left circumflex.  This was done with intra-aortic balloon pump support.  The intra-aortic balloon pump was removed at the end of the case Gregory the site was already preclosed with 1 Pro-glide device. Recommendations: Monitor overnight given high risk procedure.  Consider staged RCA PCI or proceeding directly with TAVR.   CARDIAC CATHETERIZATION Result Date: 09/09/2024 Conclusions: Severe multivessel coronary artery disease, as detailed below, including 80-90% ostial LAD stenosis, chronic total occlusion of the ramus intermedius, Gregory 80-90% in-stent restenosis at the distal margin of proximal/mid RCA stent. Normal left Gregory right heart filling pressures. Mild pulmonary hypertension. Mildly-moderately reduced Fick cardiac output/index. Recommendations: Continue workup of severe aortic stenosis, mitral regurgitation, Gregory cardiomyopathy per structural heart team. Aggressive secondary prevention of coronary artery disease. Maintain net even fluid balance with escalation of goal-directed medical therapy as tolerated. Lonni Hanson, MD [facility]  ECHOCARDIOGRAM COMPLETE Result Date: 09/07/2024    ECHOCARDIOGRAM REPORT    Patient Name:   BARAN KUHRT [facility] Date of Exam: 09/07/2024 Medical Rec #:  [id number]          Height:       69.0 in Accession #:    [id number]         Weight:       159.0 lb Date of Birth:  [birth year]         BSA:          1.874 m Patient Age:    89 years  BP:           120/84 mmHg Patient Gender: M                  HR:           75 bpm. Exam Location:  Inpatient Procedure: 2D Echo (Both Spectral Gregory Color Flow Doppler were utilized during            procedure). Indications:    CHF  History:        Patient has prior history of Echocardiogram examinations. CHF;                 Aortic Valve Disease Gregory Mitral Valve Disease.  Sonographer:    Norleen Amour Referring Phys: [id number] CAROLE N HALL IMPRESSIONS  1. Left ventricular ejection fraction, by estimation, is 40 to 45%. Left ventricular ejection fraction by 2D MOD biplane is 46.1 %. The left ventricle has mildly decreased function. The left ventricle demonstrates regional wall motion abnormalities (see  scoring diagram/findings for description). Left ventricular diastolic parameters are indeterminate.  2. Right ventricular systolic function is normal. The right ventricular size is normal. There is normal pulmonary artery systolic pressure.  3. Left atrial size was severely dilated.  4. The mitral valve is degenerative. Moderate to severe mitral valve regurgitation.  5. The aortic valve is calcified. Aortic valve regurgitation is mild. Severe aortic valve stenosis. Aortic valve area, by VTI measures 0.86 cm. Aortic valve mean gradient measures 35.0 mmHg. Aortic valve Vmax measures 3.98 m/s. Aortic valve acceleration time measures 127 msec.  6. The inferior vena cava is normal in size with greater than 50% respiratory variability, suggesting right atrial pressure of 3 mmHg. Comparison(s): Mitral regurgitation has increase. Aortic stenosis is still severe. Decrease in LVEF. FINDINGS  Left Ventricle: Left ventricular ejection fraction, by estimation, is 40 to  45%. Left ventricular ejection fraction by 2D MOD biplane is 46.1 %. The left ventricle has mildly decreased function. The left ventricle demonstrates regional wall motion abnormalities. The left ventricular internal cavity size was normal in size. There is no left ventricular hypertrophy. Left ventricular diastolic parameters are indeterminate.  LV Wall Scoring: The mid Gregory distal lateral wall, posterior wall, apical septal segment, apical anterior segment, Gregory apical inferior segment are hypokinetic. Right Ventricle: The right ventricular size is normal. No increase in right ventricular wall thickness. Right ventricular systolic function is normal. There is normal pulmonary artery systolic pressure. The tricuspid regurgitant velocity is 2.35 m/s, Gregory  with an assumed right atrial pressure of 3 mmHg, the estimated right ventricular systolic pressure is 25.1 mmHg. Left Atrium: Left atrial size was severely dilated. Right Atrium: Right atrial size was normal in size. Pericardium: There is no evidence of pericardial effusion. Mitral Valve: The mitral valve is degenerative in appearance. Moderate to severe mitral valve regurgitation. MV peak gradient, 7.4 mmHg. The mean mitral valve gradient is 3.0 mmHg. Tricuspid Valve: The tricuspid valve is normal in structure. Tricuspid valve regurgitation is trivial. No evidence of tricuspid stenosis. Aortic Valve: The aortic valve is calcified. Aortic valve regurgitation is mild. Aortic regurgitation PHT measures 498 msec. Severe aortic stenosis is present. Aortic valve mean gradient measures 35.0 mmHg. Aortic valve peak gradient measures 63.4 mmHg. Aortic valve area, by VTI measures 0.86 cm. Pulmonic Valve: The pulmonic valve was normal in structure. Pulmonic valve regurgitation is not visualized. No evidence of pulmonic stenosis. Aorta: The aortic root Gregory ascending aorta are structurally normal, with no evidence of dilitation. Venous:  The inferior vena cava is normal in size  with greater than 50% respiratory variability, suggesting right atrial pressure of 3 mmHg. IAS/Shunts: No atrial level shunt detected by color flow Doppler.  LEFT VENTRICLE PLAX 2D                        Biplane EF (MOD) LVIDd:         5.80 cm         LV Biplane EF:   Left LVIDs:         4.60 cm                          ventricular LV PW:         0.80 cm                          ejection LV IVS:        0.90 cm                          fraction by LVOT diam:     2.00 cm                          2D MOD LV SV:         72                               biplane is LV SV Index:   39                               46.1 %. LVOT Area:     3.14 cm                                Diastology                                LV e' medial:    5.61 cm/s LV Volumes (MOD)               LV E/e' medial:  27.5 LV vol d, MOD    170.0 ml      LV e' lateral:   5.70 cm/s A2C:                           LV E/e' lateral: 27.0 LV vol d, MOD    168.0 ml A4C: LV vol s, MOD    90.9 ml A2C: LV vol s, MOD    79.1 ml A4C: LV SV MOD A2C:   79.1 ml LV SV MOD A4C:   168.0 ml LV SV MOD BP:    78.0 ml RIGHT VENTRICLE             IVC RV Basal diam:  3.10 cm     IVC diam: 1.80 cm RV S prime:     11.40 cm/s TAPSE (M-mode): 2.1 cm      PULMONARY VEINS  Diastolic Velocity: 87.10 cm/s                             S/D Velocity:       0.30                             Systolic Velocity:  22.70 cm/s LEFT ATRIUM              Index        RIGHT ATRIUM           Index LA diam:        3.60 cm  1.92 cm/m   RA Area:     14.00 cm LA Vol (A2C):   106.0 ml 56.56 ml/m  RA Volume:   34.60 ml  18.46 ml/m LA Vol (A4C):   76.4 ml  40.77 ml/m LA Biplane Vol: 95.7 ml  51.07 ml/m  AORTIC VALVE                     PULMONIC VALVE AV Area (Vmax):    0.83 cm      PV Vmax:       0.83 m/s AV Area (Vmean):   0.83 cm      PV Peak grad:  2.7 mmHg AV Area (VTI):     0.86 cm AV Vmax:           398.00 cm/s AV Vmean:          266.600 cm/s AV VTI:            0.844  m AV Peak Grad:      63.4 mmHg AV Mean Grad:      35.0 mmHg LVOT Vmax:         105.00 cm/s LVOT Vmean:        70.467 cm/s LVOT VTI:          0.231 m LVOT/AV VTI ratio: 0.27 AI PHT:            498 msec  AORTA Ao Root diam: 3.00 cm Ao Asc diam:  2.70 cm MITRAL VALVE                  TRICUSPID VALVE MV Area (PHT): 3.28 cm       TR Peak grad:   22.1 mmHg MV Area VTI:   1.99 cm       TR Vmax:        235.00 cm/s MV Peak grad:  7.4 mmHg MV Mean grad:  3.0 mmHg       SHUNTS MV Vmax:       1.36 m/s       Systemic VTI:  0.23 m MV Vmean:      74.7 cm/s      Systemic Diam: 2.00 cm MV Decel Time: 231 msec MR Peak grad:    92.2 mmHg MR Mean grad:    57.0 mmHg MR Vmax:         480.00 cm/s MR Vmean:        358.0 cm/s MR PISA:         1.57 cm MR PISA Eff ROA: 10 mm MR PISA Radius:  0.50 cm MV E velocity: 154.00 cm/s MV A velocity: 49.40 cm/s MV E/A ratio:  3.12 Stanly Leavens MD Electronically signed by Stanly Leavens MD Signature Date/Time: 09/07/2024/2:30:33 PM    Final  DG Chest 2 View Result Date: 09/06/2024 EXAM: 2 VIEW(S) XRAY OF THE CHEST 09/06/2024 03:51:25 PM COMPARISON: 06/25/2024 CLINICAL HISTORY: Shortness of breath with exertion. FINDINGS: LUNGS Gregory PLEURA: Moderate left Gregory small right pleural effusions with bibasilar atelectasis, new from previous exam. Increased interstitial markings compatible with mild edema. No pneumothorax. HEART Gregory MEDIASTINUM: Aortic atherosclerosis. No acute abnormality of the cardiac Gregory mediastinal silhouettes. BONES Gregory SOFT TISSUES: Polyarticular degenerative change. No acute osseous abnormality. IMPRESSION: 1. Moderate left Gregory small right pleural effusions with bibasilar atelectasis, new from prior exam. 2. Mild interstitial pulmonary edema. Electronically signed by: Waddell Calk MD 09/06/2024 04:32 PM EST RP Workstation: [id number]    Disposition Pt is being discharged home today in good condition.  Follow-up Plans & Appointments  Discharge Instructions      Amb Referral to Cardiac Rehabilitation   Complete by: As directed    Diagnosis: Coronary Stents   After initial evaluation Gregory assessments completed: Virtual Based Care may be provided alone or in conjunction with Phase 2 Cardiac Rehab based on patient barriers.: Yes   Intensive Cardiac Rehabilitation (ICR) [facility] location only OR Traditional Cardiac Rehabilitation (TCR) *If criteria for ICR are not met will enroll in TCR [facility] only): Yes   Diet - low sodium heart healthy   Complete by: As directed    Discharge instructions   Complete by: As directed    Groin Site Care Refer to this sheet in the next few weeks. These instructions provide you with information on caring for yourself after your procedure. Your caregiver may also give you more specific instructions. Your treatment has been planned according to current medical practices, but problems sometimes occur. Call your caregiver if you have any problems or questions after your procedure. HOME CARE INSTRUCTIONS You may shower 24 hours after the procedure. Remove the bandage (dressing) Gregory gently wash the site with plain soap Gregory water. Gently pat the site dry.  Do not apply powder or lotion to the site.  Do not sit in a bathtub, swimming pool, or whirlpool for 5 to 7 days.  No bending, squatting, or lifting anything over 10 pounds (4.5 kg) as directed by your caregiver.  Inspect the site at least twice daily.  Do not drive home if you are discharged the same day of the procedure. Have someone else drive you.  You may drive 24 hours after the procedure unless otherwise instructed by your caregiver.  What to expect: Any bruising will usually fade within 1 to 2 weeks.  Blood that collects in the tissue (hematoma) may be painful to the touch. It should usually decrease in size Gregory tenderness within 1 to 2 weeks.  SEEK IMMEDIATE MEDICAL CARE IF: You have unusual pain at the groin site or down the affected leg.  You have redness, warmth,  swelling, or pain at the groin site.  You have drainage (other than a small amount of blood on the dressing).  You have chills.  You have a fever or persistent symptoms for more than 72 hours.  You have a fever Gregory your symptoms suddenly get worse.  Your leg becomes pale, cool, tingly, or numb.  You have heavy bleeding from the site. Hold pressure on the site. SABRA  PLEASE DO NOT MISS ANY DOSES OF YOUR PLAVIX !!!!! Also keep a log of you blood pressures Gregory bring back to your follow up appt. Please call the office with any questions.   Patients taking blood thinners should generally stay away from medicines like  ibuprofen, Advil, Motrin, naproxen, Gregory Aleve due to risk of stomach bleeding. You may take Tylenol  as directed or talk to your primary doctor about alternatives.  Some studies suggest Prilosec/Omeprazole interacts with Plavix . We changed your Prilosec/Omeprazole to the equivalent dose of Protonix for less chance of interaction.  PLEASE ENSURE THAT YOU DO NOT RUN OUT OF YOUR PLAVIX . This medication is very important to remain on for at least 6months. IF you have issues obtaining this medication due to cost please CALL the office 3-5 business days prior to running out in order to prevent missing doses of this medication.   Increase activity slowly   Complete by: As directed    No wound care   Complete by: As directed        Discharge Medications Allergies as of 09/16/2024   No Known Allergies      Medication List     TAKE these medications    acetaminophen  500 MG tablet Commonly known as: TYLENOL  Take 1,000 mg by mouth every 6 (six) hours as needed for mild pain (pain score 1-3) or headache.   aspirin EC 81 MG tablet Take 1 tablet (81 mg total) by mouth daily. Swallow whole.   atorvastatin 40 MG tablet Commonly known as: LIPITOR Take 1 tablet (40 mg total) by mouth daily.   Centrum Silver Men 50+ Tabs Take 1 tablet by mouth in the morning.   clopidogrel  75 MG  tablet Commonly known as: PLAVIX  Take 1 tablet (75 mg total) by mouth daily.   fish oil-omega-3 fatty acids 1000 MG capsule Take 1 g by mouth daily.   furosemide 20 MG tablet Commonly known as: LASIX Take 1 tablet (20 mg total) by mouth daily.   levothyroxine  88 MCG tablet Commonly known as: SYNTHROID  Take 1 tablet (88 mcg total) by mouth daily before breakfast.   Osteo Bi-Flex Adv Triple St Tabs Take 1 tablet by mouth in the morning.   potassium chloride 10 MEQ tablet Commonly known as: KLOR-CON M Take 1 tablet (10 mEq total) by mouth 2 (two) times daily.         Outstanding Labs/Studies  Outpt TAVR CTs  Duration of Discharge Encounter: APP Time: 20 minutes   Signed, Manuelita Rummer, NP 09/16/2024, 11:12 AM  Lamar Levorn Daugherty was seen by me today along with Manuelita Rummer, NP. I have personally performed an evaluation on this patient.  My findings are as follows:  89 y.o. male with history of CAD, DM, RBBB, HTN Gregory severe aortic stenosis. He is being evaluated for TAVR. He was admitted last week with CHF. Cardiac cath last week with severe distal left main stenosis Gregory severe restenosis of the RCA stented segment. Staged PCI of the left main/LAD yesterday with orbital atherectomy Gregory stenting of the left main into the LAD.   Doing well today. No chest pain or dyspnea.   Data: EKG(s) Gregory pertinent labs, studies, etc were personally reviewed Gregory interpreted by me:  EKG reviewed by me: NSR, RBBB Telemetry reviewed by me: sinus All labs reviewed by me Otherwise, I agree with data as outlined by the advanced practice provider.  Exam performed by me: Gen: NAD Neck: No JVD Cardiac: RRR with systolic murmur.  Lungs: clear bilaterally Extremities: No LE edema  My Assessment Gregory Plan:  CAD with unstable angina: Doing well post PCI of the left main/LAD. Will discharge home today on ASA, Plavix , statin. Will manage his RCA stenosis medically for now Gregory proceed  with TAVR workup.  Severe aortic stenosis: Continue planning for TAVR. Pre-TAVR CT scans next week.   The patient will be discharged home today  I spent 30 minutes seeing the patient. During that time, I reviewed the labs, EKG, telemetry, cath films,  echo images, evaluated their symptoms Gregory performed an examination. This time also included plan formulation, discussion of the plan with the patient Gregory the time spent with documentation.   Signed,  Lonni Cash, MD  09/16/2024 11:12 AM

## 2024-09-16 NOTE — Progress Notes (Signed)
 Attempted to walk pt in the hall, but he was very unsteady at this time. He stated he was given something to help him sleep and feels off balance at this time. I assisted him to the bathroom and back to bed. Call bell placed in reach. Education on stent placement, restrictions, HH diet, exercise guidelines, nitroglycerin  use and DAPT was done. We also discussed CRPII and a referral to the [facility] location was placed.  Augustin Sharps, RRT [phone]

## 2024-09-17 ENCOUNTER — Telehealth (HOSPITAL_COMMUNITY): Payer: Self-pay

## 2024-09-17 ENCOUNTER — Telehealth: Payer: Self-pay

## 2024-09-17 ENCOUNTER — Inpatient Hospital Stay (HOSPITAL_COMMUNITY)

## 2024-09-17 NOTE — Transitions of Care (Post Inpatient/ED Visit) (Signed)
   09/17/2024  Name: Brandley Aldrete MRN: [id number] DOB: 24-Jan-1930  Today's TOC FU Call Status: Today's TOC FU Call Status:: Unsuccessful Call (1st Attempt) Unsuccessful Call (1st Attempt) Date: 09/17/24  Attempted to reach the patient regarding the most recent Inpatient/ED visit.  Patient request a callback just getting out of shower unable to talk right now.  Follow Up Plan: Additional outreach attempts will be made to reach the patient to complete the Transitions of Care (Post Inpatient/ED visit) call.   Richerd Fish, RN, BSN, CCM [facility], [facility] Management Coordinator Direct Dial: [phone]

## 2024-09-17 NOTE — Telephone Encounter (Signed)
 Called patient to see if he was interested in Cardiac rehab.  LVMTCB

## 2024-09-20 ENCOUNTER — Telehealth: Payer: Self-pay | Admitting: *Deleted

## 2024-09-20 NOTE — Transitions of Care (Post Inpatient/ED Visit) (Signed)
 09/20/2024  Name: [name] MRN: [id number] DOB: [birth year]  Today's TOC FU Call Status: Today's TOC FU Call Status:: Successful TOC FU Call Completed TOC FU Call Complete Date: 09/20/24  Patient's Name and Date of Birth confirmed. Name, DOB  Transition Care Management Follow-up Telephone Call Date of Discharge: 09/16/24 Discharge Facility: [facility]) Type of Discharge: Inpatient Admission Primary Inpatient Discharge Diagnosis:: Effort Angina- catheterization How have you been since you were released from the hospital?:  (eating, drinking well, no issues with bowel/ baldder, ambulating without difficulty) Any questions or concerns?: No  Items Reviewed: Did you receive and understand the discharge instructions provided?: Yes Medications obtained,verified, and reconciled?: Yes (Medications Reviewed) Any new allergies since your discharge?: No Dietary orders reviewed?: Yes Type of Diet Ordered:: low sodium   heart healthy Do you have support at home?: Yes People in Home [RPT]: alone Name of Support/Comfort Primary Source: daughter [name] Reviewed importance of taking plavix  as prescribed, pt verbalizes understanding and states he is taking as prescribed Pt reports his insurance company contacted him about cardiac rehab, pt states  in the works Reviewed signs/ symptoms MI, action plan for chest pain/ contacting EMS  Medications Reviewed Today: Medications Reviewed Today     Reviewed by Aura Mliss LABOR, RN (Registered Nurse) on 09/20/24 at 1536  Med List Status: <None>   Medication Order Taking? Sig Documenting Provider Last Dose Status Informant  acetaminophen  (TYLENOL ) 500 MG tablet [id number] Yes Take 1,000 mg by mouth every 6 (six) hours as needed for mild pain (pain score 1-3) or headache. [provider]  Active Self, Pharmacy Records  aspirin EC 81 MG tablet [id number] Yes Take 1 tablet (81 mg total) by mouth daily. Swallow whole. [name],  MD  Active Self, Pharmacy Records  atorvastatin (LIPITOR) 40 MG tablet [id number] Yes Take 1 tablet (40 mg total) by mouth daily. [name], MD  Active Self, Pharmacy Records  clopidogrel  (PLAVIX ) 75 MG tablet [id number] Yes Take 1 tablet (75 mg total) by mouth daily. [name], [name], MD  Active Self, Pharmacy Records  fish oil-omega-3 fatty acids 1000 MG capsule [id number] Yes Take 1 g by mouth daily. [provider]  Active Self, Pharmacy Records  furosemide (LASIX) 20 MG tablet [id number] Yes Take 1 tablet (20 mg total) by mouth daily. [name], MD  Active Self, Pharmacy Records  levothyroxine  (SYNTHROID ) 88 MCG tablet [id number] Yes Take 1 tablet (88 mcg total) by mouth daily before breakfast. Nche, [name], NP  Active Self, Pharmacy Records  Misc Natural Products (OSTEO BI-FLEX ADV TRIPLE ST) TABS [id number] Yes Take 1 tablet by mouth in the morning. [provider]  Active Self, Pharmacy Records  Multiple Vitamins-Minerals (CENTRUM SILVER MEN 50+) TABS [id number] Yes Take 1 tablet by mouth in the morning. [provider]  Active Self, Pharmacy Records  potassium chloride (KLOR-CON M) 10 MEQ tablet [id number] Yes Take 1 tablet (10 mEq total) by mouth 2 (two) times daily. [name], MD  Active Self, Pharmacy Records           Med Note [place], [name] [name] Sep 14, 2024  1:57 PM) Holding for procedure for 09/15/24            Home Care and Equipment/Supplies: Were Home Health Services Ordered?: No Any new equipment or medical supplies ordered?: No  Functional Questionnaire: Do you need assistance with bathing/showering or dressing?: No Do you need assistance with meal preparation?: No  Do you need assistance with eating?: No Do you have difficulty maintaining continence: No Do you need assistance with getting out of bed/getting out of a chair/moving?: No Do you have difficulty managing or taking your medications?: No  Follow up  appointments reviewed: PCP Follow-up appointment confirmed?: No (pt declines for RN CM to schedule post hospital follow up appointment) MD Provider Line Number:[phone] Given: No Specialist Hospital Follow-up appointment confirmed?: Yes Date of Specialist follow-up appointment?: 09/27/24 Follow-Up Specialty Provider:: cardiologist   Dr. Wonda Do you need transportation to your follow-up appointment?: No Do you understand care options if your condition(s) worsen?: Yes-patient verbalized understanding  SDOH Interventions Today    Flowsheet Row Most Recent Value  SDOH Interventions   Food Insecurity Interventions Intervention Not Indicated  Housing Interventions Intervention Not Indicated  Transportation Interventions Intervention Not Indicated  Utilities Interventions Intervention Not Indicated    Mliss Creed [facility], BSN RN Care Manager/ Transition of Care [place]/ [facility] Population Health [phone]

## 2024-09-20 NOTE — Transitions of Care (Post Inpatient/ED Visit) (Signed)
   09/20/2024  Name: [name] MRN: [id number] DOB: [birth year]  Today's TOC FU Call Status: Today's TOC FU Call Status:: Unsuccessful Call (2nd Attempt) Unsuccessful Call (2nd Attempt) Date: 09/20/24  Attempted to reach the patient regarding the most recent Inpatient/ED visit.  Follow Up Plan: Additional outreach attempts will be made to reach the patient to complete the Transitions of Care (Post Inpatient/ED visit) call.   Mliss Creed [facility], BSN RN Care Manager/ Transition of Care [place]/ [facility] [phone]

## 2024-09-22 ENCOUNTER — Telehealth

## 2024-09-23 ENCOUNTER — Ambulatory Visit (HOSPITAL_BASED_OUTPATIENT_CLINIC_OR_DEPARTMENT_OTHER)
Admission: RE | Admit: 2024-09-23 | Discharge: 2024-09-23 | Disposition: A | Discharge status: Home / Self Care | Source: Ambulatory Visit | Attending: Cardiovascular Disease | Admitting: Cardiovascular Disease

## 2024-09-23 DIAGNOSIS — I350 Nonrheumatic aortic (valve) stenosis: Secondary | ICD-10-CM | POA: Insufficient documentation

## 2024-09-23 DIAGNOSIS — I25118 Atherosclerotic heart disease of native coronary artery with other forms of angina pectoris: Secondary | ICD-10-CM | POA: Diagnosis not present

## 2024-09-23 DIAGNOSIS — Z952 Presence of prosthetic heart valve: Secondary | ICD-10-CM | POA: Diagnosis not present

## 2024-09-23 DIAGNOSIS — Z87891 Personal history of nicotine dependence: Secondary | ICD-10-CM | POA: Diagnosis not present

## 2024-09-23 DIAGNOSIS — I110 Hypertensive heart disease with heart failure: Secondary | ICD-10-CM | POA: Diagnosis not present

## 2024-09-23 DIAGNOSIS — Z79899 Other long term (current) drug therapy: Secondary | ICD-10-CM | POA: Diagnosis not present

## 2024-09-23 DIAGNOSIS — E1122 Type 2 diabetes mellitus with diabetic chronic kidney disease: Secondary | ICD-10-CM | POA: Diagnosis present

## 2024-09-23 DIAGNOSIS — I340 Nonrheumatic mitral (valve) insufficiency: Secondary | ICD-10-CM | POA: Diagnosis not present

## 2024-09-23 DIAGNOSIS — Z7902 Long term (current) use of antithrombotics/antiplatelets: Secondary | ICD-10-CM | POA: Diagnosis not present

## 2024-09-23 DIAGNOSIS — I2510 Atherosclerotic heart disease of native coronary artery without angina pectoris: Secondary | ICD-10-CM | POA: Diagnosis present

## 2024-09-23 DIAGNOSIS — N1832 Chronic kidney disease, stage 3b: Secondary | ICD-10-CM | POA: Diagnosis present

## 2024-09-23 DIAGNOSIS — R0602 Shortness of breath: Secondary | ICD-10-CM | POA: Diagnosis not present

## 2024-09-23 DIAGNOSIS — I493 Ventricular premature depolarization: Secondary | ICD-10-CM | POA: Diagnosis present

## 2024-09-23 DIAGNOSIS — I774 Celiac artery compression syndrome: Secondary | ICD-10-CM | POA: Diagnosis not present

## 2024-09-23 DIAGNOSIS — I080 Rheumatic disorders of both mitral and aortic valves: Secondary | ICD-10-CM | POA: Diagnosis present

## 2024-09-23 DIAGNOSIS — I5023 Acute on chronic systolic (congestive) heart failure: Secondary | ICD-10-CM | POA: Diagnosis present

## 2024-09-23 DIAGNOSIS — Z9861 Coronary angioplasty status: Secondary | ICD-10-CM | POA: Diagnosis not present

## 2024-09-23 DIAGNOSIS — I130 Hypertensive heart and chronic kidney disease with heart failure and stage 1 through stage 4 chronic kidney disease, or unspecified chronic kidney disease: Secondary | ICD-10-CM | POA: Diagnosis present

## 2024-09-23 DIAGNOSIS — E039 Hypothyroidism, unspecified: Secondary | ICD-10-CM | POA: Diagnosis present

## 2024-09-23 DIAGNOSIS — Z79890 Hormone replacement therapy (postmenopausal): Secondary | ICD-10-CM | POA: Diagnosis not present

## 2024-09-23 DIAGNOSIS — Z48812 Encounter for surgical aftercare following surgery on the circulatory system: Secondary | ICD-10-CM | POA: Diagnosis not present

## 2024-09-23 DIAGNOSIS — Z955 Presence of coronary angioplasty implant and graft: Secondary | ICD-10-CM | POA: Diagnosis not present

## 2024-09-23 DIAGNOSIS — I509 Heart failure, unspecified: Secondary | ICD-10-CM | POA: Diagnosis present

## 2024-09-23 DIAGNOSIS — E785 Hyperlipidemia, unspecified: Secondary | ICD-10-CM | POA: Diagnosis present

## 2024-09-23 DIAGNOSIS — J90 Pleural effusion, not elsewhere classified: Secondary | ICD-10-CM | POA: Diagnosis not present

## 2024-09-23 DIAGNOSIS — Z01818 Encounter for other preprocedural examination: Secondary | ICD-10-CM | POA: Diagnosis not present

## 2024-09-23 DIAGNOSIS — R7989 Other specified abnormal findings of blood chemistry: Secondary | ICD-10-CM | POA: Diagnosis not present

## 2024-09-23 DIAGNOSIS — I701 Atherosclerosis of renal artery: Secondary | ICD-10-CM | POA: Diagnosis not present

## 2024-09-23 DIAGNOSIS — Z954 Presence of other heart-valve replacement: Secondary | ICD-10-CM | POA: Diagnosis not present

## 2024-09-23 DIAGNOSIS — Z1152 Encounter for screening for COVID-19: Secondary | ICD-10-CM | POA: Diagnosis not present

## 2024-09-23 DIAGNOSIS — Z7982 Long term (current) use of aspirin: Secondary | ICD-10-CM | POA: Diagnosis not present

## 2024-09-23 DIAGNOSIS — Z8249 Family history of ischemic heart disease and other diseases of the circulatory system: Secondary | ICD-10-CM | POA: Diagnosis not present

## 2024-09-23 DIAGNOSIS — I5043 Acute on chronic combined systolic (congestive) and diastolic (congestive) heart failure: Secondary | ICD-10-CM | POA: Diagnosis not present

## 2024-09-23 DIAGNOSIS — I700 Atherosclerosis of aorta: Secondary | ICD-10-CM | POA: Diagnosis not present

## 2024-09-23 DIAGNOSIS — I4510 Unspecified right bundle-branch block: Secondary | ICD-10-CM | POA: Diagnosis present

## 2024-09-23 DIAGNOSIS — I2489 Other forms of acute ischemic heart disease: Secondary | ICD-10-CM | POA: Diagnosis present

## 2024-09-23 DIAGNOSIS — E871 Hypo-osmolality and hyponatremia: Secondary | ICD-10-CM | POA: Diagnosis present

## 2024-09-23 MED ORDER — IOHEXOL 350 MG/ML SOLN
100.0000 mL | Freq: Once | INTRAVENOUS | Status: AC | PRN
  Administered 2024-09-23: 100 mL via INTRAVENOUS

## 2024-09-23 NOTE — Progress Notes (Signed)
 Procedure Type: Isolated AVR Perioperative Outcome Estimate % Operative Mortality 9.33% Morbidity & Mortality 21.8% Stroke 3.97% Renal Failure 4.97% Reoperation 4.17% Prolonged Ventilation 10.1% Deep Sternal Wound Infection 0.038% Long Hospital Stay (>14 days) 15.5% Short Hospital Stay (<6 days)* 17.8%

## 2024-09-24 ENCOUNTER — Inpatient Hospital Stay: Admitting: Thoracic Surgery (Cardiothoracic Vascular Surgery)

## 2024-09-26 ENCOUNTER — Emergency Department (HOSPITAL_COMMUNITY)

## 2024-09-26 ENCOUNTER — Inpatient Hospital Stay (HOSPITAL_COMMUNITY)
Admission: EM | Admit: 2024-09-26 | Discharge: 2024-09-28 | DRG: 286 | Disposition: A | Discharge status: Home / Self Care | Attending: Internal Medicine | Admitting: Internal Medicine

## 2024-09-26 ENCOUNTER — Other Ambulatory Visit: Payer: Self-pay

## 2024-09-26 DIAGNOSIS — I2510 Atherosclerotic heart disease of native coronary artery without angina pectoris: Secondary | ICD-10-CM | POA: Diagnosis present

## 2024-09-26 DIAGNOSIS — N1832 Chronic kidney disease, stage 3b: Secondary | ICD-10-CM | POA: Diagnosis present

## 2024-09-26 DIAGNOSIS — Z7902 Long term (current) use of antithrombotics/antiplatelets: Secondary | ICD-10-CM

## 2024-09-26 DIAGNOSIS — E785 Hyperlipidemia, unspecified: Secondary | ICD-10-CM | POA: Diagnosis present

## 2024-09-26 DIAGNOSIS — E039 Hypothyroidism, unspecified: Secondary | ICD-10-CM | POA: Diagnosis present

## 2024-09-26 DIAGNOSIS — I493 Ventricular premature depolarization: Secondary | ICD-10-CM | POA: Diagnosis present

## 2024-09-26 DIAGNOSIS — Z9861 Coronary angioplasty status: Secondary | ICD-10-CM | POA: Diagnosis not present

## 2024-09-26 DIAGNOSIS — E871 Hypo-osmolality and hyponatremia: Secondary | ICD-10-CM | POA: Diagnosis present

## 2024-09-26 DIAGNOSIS — I4510 Unspecified right bundle-branch block: Secondary | ICD-10-CM | POA: Diagnosis present

## 2024-09-26 DIAGNOSIS — I509 Heart failure, unspecified: Secondary | ICD-10-CM

## 2024-09-26 DIAGNOSIS — I350 Nonrheumatic aortic (valve) stenosis: Secondary | ICD-10-CM | POA: Diagnosis not present

## 2024-09-26 DIAGNOSIS — R9431 Abnormal electrocardiogram [ECG] [EKG]: Secondary | ICD-10-CM

## 2024-09-26 DIAGNOSIS — Z79890 Hormone replacement therapy (postmenopausal): Secondary | ICD-10-CM

## 2024-09-26 DIAGNOSIS — R7989 Other specified abnormal findings of blood chemistry: Secondary | ICD-10-CM

## 2024-09-26 DIAGNOSIS — Z87891 Personal history of nicotine dependence: Secondary | ICD-10-CM | POA: Diagnosis not present

## 2024-09-26 DIAGNOSIS — I130 Hypertensive heart and chronic kidney disease with heart failure and stage 1 through stage 4 chronic kidney disease, or unspecified chronic kidney disease: Secondary | ICD-10-CM | POA: Diagnosis present

## 2024-09-26 DIAGNOSIS — I25118 Atherosclerotic heart disease of native coronary artery with other forms of angina pectoris: Secondary | ICD-10-CM | POA: Diagnosis not present

## 2024-09-26 DIAGNOSIS — Z1152 Encounter for screening for COVID-19: Secondary | ICD-10-CM | POA: Diagnosis not present

## 2024-09-26 DIAGNOSIS — I5023 Acute on chronic systolic (congestive) heart failure: Principal | ICD-10-CM | POA: Diagnosis present

## 2024-09-26 DIAGNOSIS — Z955 Presence of coronary angioplasty implant and graft: Secondary | ICD-10-CM

## 2024-09-26 DIAGNOSIS — Z952 Presence of prosthetic heart valve: Secondary | ICD-10-CM

## 2024-09-26 DIAGNOSIS — Z7982 Long term (current) use of aspirin: Secondary | ICD-10-CM | POA: Diagnosis not present

## 2024-09-26 DIAGNOSIS — E1122 Type 2 diabetes mellitus with diabetic chronic kidney disease: Secondary | ICD-10-CM | POA: Diagnosis present

## 2024-09-26 DIAGNOSIS — I080 Rheumatic disorders of both mitral and aortic valves: Secondary | ICD-10-CM | POA: Diagnosis present

## 2024-09-26 DIAGNOSIS — Z79899 Other long term (current) drug therapy: Secondary | ICD-10-CM | POA: Diagnosis not present

## 2024-09-26 DIAGNOSIS — I2489 Other forms of acute ischemic heart disease: Secondary | ICD-10-CM | POA: Diagnosis present

## 2024-09-26 DIAGNOSIS — Z8249 Family history of ischemic heart disease and other diseases of the circulatory system: Secondary | ICD-10-CM | POA: Diagnosis not present

## 2024-09-26 DIAGNOSIS — I5043 Acute on chronic combined systolic (congestive) and diastolic (congestive) heart failure: Secondary | ICD-10-CM | POA: Diagnosis not present

## 2024-09-26 DIAGNOSIS — I340 Nonrheumatic mitral (valve) insufficiency: Secondary | ICD-10-CM | POA: Diagnosis not present

## 2024-09-26 LAB — BASIC METABOLIC PANEL WITH GFR
Anion gap: 14 (ref 5–15)
BUN: 20 mg/dL (ref 8–23)
CO2: 20 mmol/L — ABNORMAL LOW (ref 22–32)
Calcium: 9.2 mg/dL (ref 8.9–10.3)
Chloride: 95 mmol/L — ABNORMAL LOW (ref 98–111)
Creatinine, Ser: 1.52 mg/dL — ABNORMAL HIGH (ref 0.61–1.24)
GFR, Estimated: 43 mL/min — ABNORMAL LOW (ref >60)
Glucose, Bld: 92 mg/dL (ref 70–99)
Potassium: 4.6 mmol/L (ref 3.5–5.1)
Sodium: 129 mmol/L — ABNORMAL LOW (ref 135–145)

## 2024-09-26 LAB — CBC WITH DIFFERENTIAL/PLATELET
Abs Immature Granulocytes: 0.04 K/uL (ref 0.00–0.07)
Basophils Absolute: 0 K/uL (ref 0.0–0.1)
Basophils Relative: 0 %
Eosinophils Absolute: 0.1 K/uL (ref 0.0–0.5)
Eosinophils Relative: 2 %
HCT: 39.6 % (ref 39.0–52.0)
Hemoglobin: 13.4 g/dL (ref 13.0–17.0)
Immature Granulocytes: 0 %
Lymphocytes Relative: 12 %
Lymphs Abs: 1.1 K/uL (ref 0.7–4.0)
MCH: 31.5 pg (ref 26.0–34.0)
MCHC: 33.8 g/dL (ref 30.0–36.0)
MCV: 93 fL (ref 80.0–100.0)
Monocytes Absolute: 1.1 K/uL — ABNORMAL HIGH (ref 0.1–1.0)
Monocytes Relative: 11 %
Neutro Abs: 7.1 K/uL (ref 1.7–7.7)
Neutrophils Relative %: 75 %
Platelets: 316 K/uL (ref 150–400)
RBC: 4.26 MIL/uL (ref 4.22–5.81)
RDW: 12.6 % (ref 11.5–15.5)
WBC: 9.5 K/uL (ref 4.0–10.5)
nRBC: 0 % (ref 0.0–0.2)

## 2024-09-26 LAB — TROPONIN I (HIGH SENSITIVITY)
Troponin I (High Sensitivity): 68 ng/L — ABNORMAL HIGH (ref <18)
Troponin I (High Sensitivity): 64 ng/L — ABNORMAL HIGH (ref <18)

## 2024-09-26 LAB — RESP PANEL BY RT-PCR (RSV, FLU A&B, COVID)  RVPGX2
Influenza A by PCR: NEGATIVE
Influenza B by PCR: NEGATIVE
Resp Syncytial Virus by PCR: NEGATIVE
SARS Coronavirus 2 by RT PCR: NEGATIVE

## 2024-09-26 LAB — BRAIN NATRIURETIC PEPTIDE: B Natriuretic Peptide: 1363 pg/mL — ABNORMAL HIGH (ref 0.0–100.0)

## 2024-09-26 MED ORDER — ACETAMINOPHEN 325 MG PO TABS: 650.0000 mg | ORAL_TABLET | Freq: Four times a day (QID) | ORAL | Status: DC | PRN

## 2024-09-26 MED ORDER — LEVOTHYROXINE SODIUM 88 MCG PO TABS
88.0000 ug | ORAL_TABLET | Freq: Every day | ORAL | Status: DC
  Administered 2024-09-27 – 2024-09-28 (×2): 88 ug via ORAL
  Filled 2024-09-26 (×3): qty 1

## 2024-09-26 MED ORDER — HEPARIN SODIUM (PORCINE) 5000 UNIT/ML IJ SOLN
5000.0000 [IU] | Freq: Three times a day (TID) | INTRAMUSCULAR | Status: DC
  Administered 2024-09-27 – 2024-09-28 (×4): 5000 [IU] via SUBCUTANEOUS
  Filled 2024-09-26 (×4): qty 1

## 2024-09-26 MED ORDER — ASPIRIN 81 MG PO TBEC
81.0000 mg | DELAYED_RELEASE_TABLET | Freq: Every day | ORAL | Status: DC
  Administered 2024-09-27 – 2024-09-28 (×2): 81 mg via ORAL
  Filled 2024-09-26 (×2): qty 1

## 2024-09-26 MED ORDER — ATORVASTATIN CALCIUM 40 MG PO TABS
40.0000 mg | ORAL_TABLET | Freq: Every day | ORAL | Status: DC
  Administered 2024-09-27 – 2024-09-28 (×2): 40 mg via ORAL
  Filled 2024-09-26 (×2): qty 1

## 2024-09-26 MED ORDER — ACETAMINOPHEN 650 MG RE SUPP: 650.0000 mg | Freq: Four times a day (QID) | RECTAL | Status: DC | PRN

## 2024-09-26 MED ORDER — FUROSEMIDE 10 MG/ML IJ SOLN
40.0000 mg | Freq: Once | INTRAMUSCULAR | Status: AC
  Administered 2024-09-26: 40 mg via INTRAVENOUS
  Filled 2024-09-26: qty 4

## 2024-09-26 MED ORDER — CLOPIDOGREL BISULFATE 75 MG PO TABS
75.0000 mg | ORAL_TABLET | Freq: Every day | ORAL | Status: DC
  Administered 2024-09-27 – 2024-09-28 (×2): 75 mg via ORAL
  Filled 2024-09-26 (×2): qty 1

## 2024-09-26 NOTE — ED Notes (Signed)
Pt provided ice chips.

## 2024-09-26 NOTE — ED Provider Notes (Incomplete)
 Patient is an elderly male with a history of severe aortic stenosis, CAD with recent stent placement x 2 in his LAD who has been home now from these procedures for approximately 10 days planning on a TAVR on December 2 who is returning due to worsening shortness of breath on exertion.  Denies any swelling in his abdomen or legs but reports he is really not able to do the things he normally does.  Now even transitioning from the car to the wheelchair here made him severely short of breath.  He will occasionally have some chest tightness with this.  However when he is sitting in bed he reports that he feels okay although he is noted to be mildly tachypneic.  Labs indicate worsening heart failure with worsening pleural effusions.  Patient is awake alert and otherwise stable at this time.  He has been compliant with the oral Lasix  he has been getting at home.

## 2024-09-26 NOTE — ED Notes (Signed)
 emptied from urinal. Cleaned and dry.

## 2024-09-26 NOTE — ED Provider Notes (Signed)
 [place] EMERGENCY DEPARTMENT AT [facility] Provider Note   CSN: [id number] Arrival date & time: 09/26/24  1251     Patient presents with: No chief complaint on file.   [name] is a 89 y.o. male. Hx of CAD s/p PCI to RCA in 2012, DM, hypertension, hyperlipidemia, hypothyroidism, severe aortic stenosis presenting with SOB for 2-3 days.  History per patient, patient family.  Report worsening shortness of breath, primarily exacerbated when laying down, and with activity.  Has been progressively worsening, patient generally is very independent however with worsening shortness of breath he called his daughter, who is a paramedic.  Brought him to the ED today.  Denies fever or chills.  Endorses having asleep either in a chair or with 3-4 pillows, and having difficulty with activity secondary to shortness of breath.  Endorses intermittent chest pressure, states that it is not any chest pain at this time.  Denies nausea or vomiting.  Patient was supposed to follow-up with cardiology tomorrow, planning on undergoing a TAVR on 12/2.  On chart review, patient was recently admitted 09/15/2024 through 09/16/2024 secondary to chest pain and shortness of breath.  LVEF 11/440 to 45% with normal RV, severe aortic stenosis with mean gradient 35 mmHg.  Initial plan to undergo TAVR and PCI.  Underwent cardiac 11/12 with successful high risk OCT guided orbital atherectomy of the oLAD with stent placement back into the ostium of the left main with IABP support. oLCX appeared disease initially but no critical stenosis was noted after atherectomy and this was not treated. Does have residual disease of the RCA, but will undergo TAVR initially and follow up regarding symptoms to determine if RCA needs treatment afterwards. Continue ASA and plavix .   CT coronary morph on 09/23/2024:  1. Annular measurements support a 23 mm S3 or 26 mm Evolut Pro.  2. Single, moderate calcification under the LCC  extending into the LVOT.  3. Sufficient coronary to annulus distance.  4. Optimal Fluoroscopic Angle for Delivery: RAO 3 CAU 25  5. Moderate bilateral pleural effusions.  6. Dilated pulmonary artery suggestive of pulmonary hypertension.    HPI     Prior to Admission medications   Medication Sig Start Date End Date Taking? Authorizing Provider  acetaminophen  (TYLENOL ) 500 MG tablet Take 1,000 mg by mouth every 6 (six) hours as needed for mild pain (pain score 1-3) or headache.   Yes [provider]  aspirin  EC 81 MG tablet Take 1 tablet (81 mg total) by mouth daily. Swallow whole. 09/12/24  Yes [name], MD  atorvastatin  (LIPITOR) 40 MG tablet Take 1 tablet (40 mg total) by mouth daily. 09/12/24  Yes [name], MD  clopidogrel  (PLAVIX ) 75 MG tablet Take 1 tablet (75 mg total) by mouth daily. 04/05/24  Yes [name], [name], MD  fish oil-omega-3 fatty acids  1000 MG capsule Take 1 g by mouth daily.   Yes [provider]  furosemide  (LASIX ) 20 MG tablet Take 1 tablet (20 mg total) by mouth daily. 09/12/24  Yes [name], MD  levothyroxine  (SYNTHROID ) 88 MCG tablet Take 1 tablet (88 mcg total) by mouth daily before breakfast. 12/22/23  Yes Nche, [name], NP  Multiple Vitamins-Minerals (CENTRUM SILVER MEN 50+) TABS Take 1 tablet by mouth in the morning.   Yes [provider]    Allergies: Patient has no known allergies.    Review of Systems  Updated Vital Signs BP 109/67   Pulse 64  Temp (!) 97.4 F (36.3 C) (Oral)   Resp 18   SpO2 99%   Physical Exam Vitals and nursing note reviewed.  Constitutional:      General: He is not in acute distress.    Appearance: He is well-developed.  HENT:     Head: Normocephalic and atraumatic.  Eyes:     Conjunctiva/sclera: Conjunctivae normal.  Neck:     Comments: JVD appreciated approx 1 cm below ear lobe Cardiovascular:     Rate and Rhythm: Normal rate and regular rhythm.     Heart sounds:  No murmur heard. Pulmonary:     Effort: Pulmonary effort is normal. No respiratory distress.     Breath sounds: Normal breath sounds.  Abdominal:     Palpations: Abdomen is soft.     Tenderness: There is no abdominal tenderness.  Musculoskeletal:     Cervical back: Neck supple.     Right lower leg: Edema present.     Left lower leg: Edema present.  Skin:    General: Skin is warm and dry.     Capillary Refill: Capillary refill takes less than 2 seconds.  Neurological:     General: No focal deficit present.     Mental Status: He is alert and oriented to person, place, and time.  Psychiatric:        Mood and Affect: Mood normal.     (all labs ordered are listed, but only abnormal results are displayed) Labs Reviewed  BASIC METABOLIC PANEL WITH GFR - Abnormal; Notable for the following components:      Result Value   Sodium 129 (*)    Chloride 95 (*)    CO2 20 (*)    Creatinine, Ser 1.52 (*)    GFR, Estimated 43 (*)    All other components within normal limits  CBC WITH DIFFERENTIAL/PLATELET - Abnormal; Notable for the following components:   Monocytes Absolute 1.1 (*)    All other components within normal limits  BRAIN NATRIURETIC PEPTIDE - Abnormal; Notable for the following components:   B Natriuretic Peptide 1,363.0 (*)    All other components within normal limits  TROPONIN I (HIGH SENSITIVITY) - Abnormal; Notable for the following components:   Troponin I (High Sensitivity) 68 (*)    All other components within normal limits  TROPONIN I (HIGH SENSITIVITY) - Abnormal; Notable for the following components:   Troponin I (High Sensitivity) 64 (*)    All other components within normal limits  RESP PANEL BY RT-PCR (RSV, FLU A&B, COVID)  RVPGX2  COMPREHENSIVE METABOLIC PANEL WITH GFR  MAGNESIUM    EKG: EKG Interpretation Date/Time:  Sunday September 26 2024 14:00:14 EST Ventricular Rate:  66 PR Interval:  150 QRS Duration:  146 QT Interval:  524 QTC  Calculation: 549 R Axis:   95  Text Interpretation: Normal sinus rhythm Right bundle branch block No significant change since last tracing When compared with ECG of 16-Sep-2024 05:19, PREVIOUS ECG IS PRESENT Confirmed by Doretha Folks ([id number]) on 09/26/2024 3:25:53 PM  Radiology: [facility] Chest 2 View Result Date: 09/26/2024 CLINICAL DATA:  sob EXAM: CHEST - 2 VIEW COMPARISON:  September 06, 2024 FINDINGS: The cardiomediastinal silhouette is unchanged in contour.Atherosclerotic calcifications of the aorta. Moderate LEFT and small RIGHT pleural effusion. No pneumothorax. Diffuse interstitial prominence. Visualized abdomen is unremarkable. Mild multilevel degenerative changes of the thoracic spine. IMPRESSION: Constellation of findings are favored to reflect pulmonary edema with moderate LEFT and small RIGHT pleural effusions. Electronically Signed  By: Corean Salter M.D.   On: 09/26/2024 14:43     Procedures   Medications Ordered in the ED  aspirin  EC tablet 81 mg (has no administration in time range)  clopidogrel  (PLAVIX ) tablet 75 mg (has no administration in time range)  atorvastatin  (LIPITOR) tablet 40 mg (has no administration in time range)  levothyroxine  (SYNTHROID ) tablet 88 mcg (has no administration in time range)  heparin  injection 5,000 Units (has no administration in time range)  acetaminophen  (TYLENOL ) tablet 650 mg (has no administration in time range)    Or  acetaminophen  (TYLENOL ) suppository 650 mg (has no administration in time range)  furosemide  (LASIX ) injection 40 mg (40 mg Intravenous Given 09/26/24 1632)                                    Medical Decision Making Risk Prescription drug management. Decision regarding hospitalization.   Based on patient presentation, history, evaluation, high suspicion for CHF exacerbation with known history of aortic stenosis, systolic heart failure.  Given small dosage of Lasix  while in the ED considering history of severe  aortic stenosis with plan to undergo TAVR in the near future.  Discussed with cardiology service, evaluated patient, likely plan to move up TAVR into this admission.  Discussed with hospitalist service, agreeable to admission with cardiology continue to follow.  Has overall remained hemodynamically stable, well-appearing while laying in bed.  Will become dyspneic with activity.     Final diagnoses:  Acute on chronic systolic congestive heart failure (HCC)  Aortic valve stenosis, etiology of cardiac valve disease unspecified    ED Discharge Orders     None          Arlee Katz, MD 09/26/24 2109    Doretha Folks, MD 09/27/24 2105

## 2024-09-26 NOTE — H&P (Signed)
 History and Physical    [name] FMW:[id number] DOB: 1930/08/31 DOA: 09/26/2024  PCP: Katheen Gregory [place], NP  Patient coming from: Home  Chief Complaint: Shortness of breath  HPI: [name] is a 89 y.o. male with medical history significant of severe aortic stenosis, CAD with history of stenting to RCA, CHF, type 2 diabetes, CKD stage IIIb, hyperlipidemia, hypothyroidism.  Patient is currently undergoing active TAVR workup for his severe aortic stenosis.  On 09/15/2024, patient underwent PCI with intra-aortic balloon pump support with OCT guided orbital atherectomy of the distal ostial LAD and distal left main with DES placement extending all the way back to the ostium of the left main.  Patient is scheduled for TAVR on 12/2 and presents to the ED today due to worsening shortness of breath.  Patient is reporting worsening shortness of breath/dyspnea with minimal exertion for the past 3 to 4 days and also endorsing worsening orthopnea.  He has not noticed any swelling of his legs.  He is taking Lasix  20 mg daily and has not missed any doses.  Also endorsing cough but denies fevers.  Denies chest pain or pressure.  No other complaints.  ED Course: Vital signs stable.  Labs notable for sodium 129, chloride 95, bicarb 20, glucose 92, creatinine 1.5 (stable), COVID/influenza/RSV PCR negative, BNP 1363, troponin 68> 64.  Chest x-ray showing pulmonary edema with moderate left and small right pleural effusions.  Patient was given IV Lasix  40 mg.  Cardiology consulted.  Review of Systems:  Review of Systems  All other systems reviewed and are negative.   Past Medical History:  Diagnosis Date   Aortic stenosis    Coronary artery disease    PCI, STent to RCA   Diabetes mellitus without complication (HCC)    Hyperlipidemia    Hypothyroidism    Rash    Thyroid  disease    hypothyroidism    Past Surgical History:  Procedure Laterality Date   APPENDECTOMY     CARDIAC  CATHETERIZATION     2007 occluded ramus inter. , PCI to RCA   CARPAL TUNNEL RELEASE     CORONARY ATHERECTOMY N/A 09/15/2024   Procedure: CORONARY ATHERECTOMY;  Surgeon: Darron Deatrice LABOR, MD;  Location: [facility];  Service: Cardiovascular;  Laterality: N/A;   CORONARY IMAGING/OCT N/A 09/15/2024   Procedure: CORONARY IMAGING/OCT;  Surgeon: Darron Deatrice LABOR, MD;  Location: [facility];  Service: Cardiovascular;  Laterality: N/A;   CORONARY STENT INTERVENTION N/A 09/15/2024   Procedure: CORONARY STENT INTERVENTION;  Surgeon: Darron Deatrice LABOR, MD;  Location: [facility];  Service: Cardiovascular;  Laterality: N/A;   DECOMPRESSIVE LUMBAR LAMINECTOMY LEVEL 2 N/A 12/20/2022   Procedure: LUMBAR THREE THROUGH FIVE LAMINECTOMIES AND FORAMINOTOMIES;  Surgeon: Georgina Ozell LABOR, MD;  Location: [facility];  Service: Orthopedics;  Laterality: N/A;   EYE SURGERY Bilateral    cataract surgery   IABP INSERTION N/A 09/15/2024   Procedure: IABP Insertion;  Surgeon: Darron Deatrice LABOR, MD;  Location: [facility];  Service: Cardiovascular;  Laterality: N/A;   LUMBAR LAMINECTOMY/DECOMPRESSION MICRODISCECTOMY N/A 12/22/2022   Procedure: POSTERIOR LUMBAR DURAL REPAIR;  Surgeon: Georgina Ozell LABOR, MD;  Location: [facility];  Service: Orthopedics;  Laterality: N/A;   RIGHT HEART CATH AND CORONARY ANGIOGRAPHY N/A 09/09/2024   Procedure: RIGHT HEART CATH AND CORONARY ANGIOGRAPHY;  Surgeon: Mady Bruckner, MD;  Location: [facility];  Service: Cardiovascular;  Laterality: N/A;   TONSILLECTOMY  reports that he quit smoking about 63 years ago. His smoking use included cigarettes. He has never used smokeless tobacco. He reports that he does not drink alcohol and does not use drugs.  No Known Allergies  Family History  Problem Relation Age of Onset   Hypertension Mother    Hypertension Father    Allergic rhinitis Neg Hx    Angioedema Neg Hx    Asthma Neg Hx    Eczema Neg Hx     Immunodeficiency Neg Hx    Urticaria Neg Hx     Prior to Admission medications   Medication Sig Start Date End Date Taking? Authorizing Provider  acetaminophen  (TYLENOL ) 500 MG tablet Take 1,000 mg by mouth every 6 (six) hours as needed for mild pain (pain score 1-3) or headache.   Yes [provider]  aspirin  EC 81 MG tablet Take 1 tablet (81 mg total) by mouth daily. Swallow whole. 09/12/24  Yes Gregory Yetta HERO, MD  atorvastatin  (LIPITOR) 40 MG tablet Take 1 tablet (40 mg total) by mouth daily. 09/12/24  Yes Gregory Yetta HERO, MD  clopidogrel  (PLAVIX ) 75 MG tablet Take 1 tablet (75 mg total) by mouth daily. 04/05/24  Yes [name], [name], MD  fish oil-omega-3 fatty acids  1000 MG capsule Take 1 g by mouth daily.   Yes [provider]  furosemide  (LASIX ) 20 MG tablet Take 1 tablet (20 mg total) by mouth daily. 09/12/24  Yes Gregory Yetta HERO, MD  levothyroxine  (SYNTHROID ) 88 MCG tablet Take 1 tablet (88 mcg total) by mouth daily before breakfast. 12/22/23  Yes Nche, Gregory [place], NP  Multiple Vitamins-Minerals (CENTRUM SILVER MEN 50+) TABS Take 1 tablet by mouth in the morning.   Yes [provider]    Physical Exam: Vitals:   09/26/24 1700 09/26/24 1900 09/26/24 1915 09/26/24 1926  BP: 136/62 (!) 116/47 109/67   Pulse: 75 71 64   Resp: 19 (!) 23 18   Temp:    (!) 97.4 F (36.3 C)  TempSrc:    Oral  SpO2: 98% 99% 99%     Physical Exam Vitals reviewed.  Constitutional:      General: He is not in acute distress. HENT:     Head: Normocephalic and atraumatic.  Eyes:     Extraocular Movements: Extraocular movements intact.  Neck:     Comments: +JVD Cardiovascular:     Rate and Rhythm: Normal rate and regular rhythm.     Heart sounds: Murmur heard.  Pulmonary:     Effort: Pulmonary effort is normal. No respiratory distress.     Comments: Diminished breath sounds at the bases Abdominal:     General: Bowel sounds are normal.     Palpations: Abdomen is soft.      Tenderness: There is no abdominal tenderness. There is no guarding.  Musculoskeletal:     Cervical back: Normal range of motion.     Right lower leg: No edema.     Left lower leg: No edema.  Skin:    General: Skin is warm and dry.  Neurological:     General: No focal deficit present.     Mental Status: He is alert and oriented to person, place, and time.     Labs on Admission: I have personally reviewed following labs and imaging studies  CBC: Recent Labs  Lab 09/26/24 1348  WBC 9.5  NEUTROABS 7.1  HGB 13.4  HCT 39.6  MCV 93.0  PLT 316   Basic Metabolic Panel:  Recent Labs  Lab 09/26/24 1348  NA 129*  K 4.6  CL 95*  CO2 20*  GLUCOSE 92  BUN 20  CREATININE 1.52*  CALCIUM  9.2   GFR: Estimated Creatinine Clearance: 31.3 mL/min (A) (by C-G formula based on SCr of 1.52 mg/dL (H)). Liver Function Tests: No results for input(s): AST, ALT, ALKPHOS, BILITOT, PROT, ALBUMIN in the last 168 hours. No results for input(s): LIPASE, AMYLASE in the last 168 hours. No results for input(s): AMMONIA in the last 168 hours. Coagulation Profile: No results for input(s): INR, PROTIME in the last 168 hours. Cardiac Enzymes: No results for input(s): CKTOTAL, CKMB, CKMBINDEX, TROPONINI in the last 168 hours. BNP (last 3 results) Recent Labs    09/06/24 1530  PROBNP 6,771*   HbA1C: No results for input(s): HGBA1C in the last 72 hours. CBG: No results for input(s): GLUCAP in the last 168 hours. Lipid Profile: No results for input(s): CHOL, HDL, LDLCALC, TRIG, CHOLHDL, LDLDIRECT in the last 72 hours. Thyroid  Function Tests: No results for input(s): TSH, T4TOTAL, FREET4, T3FREE, THYROIDAB in the last 72 hours. Anemia Panel: No results for input(s): VITAMINB12, FOLATE, FERRITIN, TIBC, IRON, RETICCTPCT in the last 72 hours. Urine analysis:    Component Value Date/Time   COLORURINE YELLOW 06/26/2024 0233    APPEARANCEUR CLEAR 06/26/2024 0233   LABSPEC 1.018 06/26/2024 0233   PHURINE 5.0 06/26/2024 0233   GLUCOSEU NEGATIVE 06/26/2024 0233   HGBUR NEGATIVE 06/26/2024 0233   BILIRUBINUR NEGATIVE 06/26/2024 0233   KETONESUR NEGATIVE 06/26/2024 0233   PROTEINUR NEGATIVE 06/26/2024 0233   NITRITE NEGATIVE 06/26/2024 0233   LEUKOCYTESUR NEGATIVE 06/26/2024 0233    Radiological Exams on Admission: DG Chest 2 View Result Date: 09/26/2024 CLINICAL DATA:  sob EXAM: CHEST - 2 VIEW COMPARISON:  September 06, 2024 FINDINGS: The cardiomediastinal silhouette is unchanged in contour.Atherosclerotic calcifications of the aorta. Moderate LEFT and small RIGHT pleural effusion. No pneumothorax. Diffuse interstitial prominence. Visualized abdomen is unremarkable. Mild multilevel degenerative changes of the thoracic spine. IMPRESSION: Constellation of findings are favored to reflect pulmonary edema with moderate LEFT and small RIGHT pleural effusions. Electronically Signed   By: Corean Salter M.D.   On: 09/26/2024 14:43    EKG: Independently reviewed.  Sinus rhythm, RBBB, QTc 549.  No acute ischemic changes in comparison to previous EKG.  Assessment and Plan  Acute on chronic systolic CHF Severe aortic stenosis Moderate to severe mitral regurgitation CAD status post recent PCI to LAD, history of stenting to RCA Patient is presenting with 3 to 4-day history of worsening dyspnea with minimal exertion and orthopnea despite compliance with Lasix  20 mg daily.  Troponin mildly elevated but stable, not consistent with ACS.  Patient is not endorsing chest pain.  BNP 1363.  Chest x-ray showing pulmonary edema with moderate left and small right pleural effusions.  Not hypoxic or hypotensive.  Echo done 09/07/2024 showing EF 40 to 45%, moderate to severe mitral regurgitation, and severe aortic stenosis.  Patient is currently undergoing active TAVR workup for his severe aortic stenosis.  On 09/15/2024, he underwent PCI with  intra-aortic balloon pump support with OCT guided orbital atherectomy of the distal ostial LAD and distal left main with DES placement extending all the way back to the ostium of the left main.  He is supposed to be scheduled for TAVR on 12/2.  Cardiology consulted and patient was given IV Lasix  40 mg in the ED.  Continue aspirin , Plavix , and Lipitor.  Monitor intake and output, daily weights,  renal function.  Low-sodium diet with fluid restriction.  Management per cardiology.  Elevated troponin Likely due to demand ischemia.  Pattern not consistent with ACS.  Patient is not endorsing chest pain.  QT prolongation Monitor potassium and magnesium levels, replace as needed.  Avoid QT prolonging drugs.  Hypervolemic hyponatremia Continue IV diuresis per cardiology and monitor sodium level.  Diet controlled diabetes Hemoglobin A1c 6.2 on 07/06/2024.  CKD stage IIIb Creatinine stable, monitor renal function.  Hyperlipidemia Continue Lipitor.  Hypothyroidism Continue Synthroid .  DVT prophylaxis: SQ Heparin  Code Status: Full Code (discussed with the patient and his daughter) Family Communication: Daughter at bedside. Consults called: Cardiology Level of care: Progressive Care Unit Admission status: It is my clinical opinion that admission to INPATIENT is reasonable and necessary because of the expectation that this patient will require hospital care that crosses at least 2 midnights to treat this condition based on the medical complexity of the problems presented.  Given the aforementioned information, the predictability of an adverse outcome is felt to be significant.  Editha Ram MD [facility]  If 7PM-7AM, please contact night-coverage [email or web address]  09/26/2024, 7:39 PM

## 2024-09-26 NOTE — ED Triage Notes (Signed)
 Patient bib granddaughter with complaints of shob for the past 3-4 days and it is not getting any better. He had a stent placed on 11/12. He is supposed to meet with cardiology tomorrow about a valve replacement. Patient reports chest heaviness.   Patient has history of CHF

## 2024-09-26 NOTE — Consult Note (Signed)
 Cardiology Consultation   Patient ID: Toivo Bordon MRN: [id number]; DOB: [birth year]  Admit date: 09/26/2024 Date of Consult: 09/26/2024  PCP:  [name], NP   [facility] Providers Cardiologist:  Lonni Cash, MD        Patient Profile: Joelle Flessner is a 89 y.o. male with a hx of CAD s/p stenting to RCA, DM, HTN, HLD, hypothyroidism and severe aortic stenosis  who is being seen 09/26/2024 for the evaluation of aortic stenosis at the request of Benton Shone MD.  History of Present Illness: In July he was seen by Dr. Cash for evaluation of asymptomatic severe aortic stenosis and was felt he could be a good candidate for TAVR at that time. He began having shortness of breath, orthopnea and PND and saw his PCP who found a small right-sided pleural effusion with atelectasis CXR and sent him to the ED for further evaluation. He was noted to be in acute systolic heart failure upon presentation and was diuresed. He was consulted on 11/4. An echocardiogram showed an ejection fraction of 40 to 45% with normal RV, mid to distal lateral wall, posterior and apical septal hypokinesis, moderate to severe MR, severe aortic stenosis with mean gradient 35 mmHg. He underwent right and left heart catheterization in preparation for TAVR on 09/09/2024 with Dr. Mady. Patient was to have CT surgery evaluation, though was deemed a surgical candidate. He then underwent PCI with balloon pump support on 11/12 with OCT guided orbital atherectomy of the ostial LAD and distal left main with DES placement extending all the way back to the ostium of the left main. Patient has an outpatient appointment with Dr. Wonda tomorrow.   Presented to ED today for progressive shortness of breath for several days.  BP: 12/55   HR 74 ECG sinus rhythm with RBBB VR 66 CXR shows pulmonary edema with bilateral pleural effusions L>R  Pertinent lab work Na 129 Cr 1.52 BNP 1363 Troponin  68  He has one dose of IV lasix  40 mg scheduled.  On interview, patient reported since being discharge he has been more fatigued though still able to complete his ADLs as he lives alone. About 3 days ago started noticing orthopnea and last night noting it so bad he was unable to sleep. Is experiencing back pain that is between his shoulder blades.  Denied peripheral edema Weight has been stable at home around 144lbs   Past Medical History:  Diagnosis Date   Aortic stenosis    Coronary artery disease    PCI, STent to RCA   Diabetes mellitus without complication (HCC)    Hyperlipidemia    Hypothyroidism    Rash    Thyroid  disease    hypothyroidism    Past Surgical History:  Procedure Laterality Date   APPENDECTOMY     CARDIAC CATHETERIZATION     2007 occluded ramus inter. , PCI to RCA   CARPAL TUNNEL RELEASE     CORONARY ATHERECTOMY N/A 09/15/2024   Procedure: CORONARY ATHERECTOMY;  Surgeon: Darron Deatrice LABOR, MD;  Location: [facility];  Service: Cardiovascular;  Laterality: N/A;   CORONARY IMAGING/OCT N/A 09/15/2024   Procedure: CORONARY IMAGING/OCT;  Surgeon: Darron Deatrice LABOR, MD;  Location: [facility];  Service: Cardiovascular;  Laterality: N/A;   CORONARY STENT INTERVENTION N/A 09/15/2024   Procedure: CORONARY STENT INTERVENTION;  Surgeon: Darron Deatrice LABOR, MD;  Location: [facility];  Service: Cardiovascular;  Laterality: N/A;   DECOMPRESSIVE LUMBAR LAMINECTOMY  LEVEL 2 N/A 12/20/2022   Procedure: LUMBAR THREE THROUGH FIVE LAMINECTOMIES AND FORAMINOTOMIES;  Surgeon: Georgina Ozell LABOR, MD;  Location: [facility] OR;  Service: Orthopedics;  Laterality: N/A;   EYE SURGERY Bilateral    cataract surgery   IABP INSERTION N/A 09/15/2024   Procedure: IABP Insertion;  Surgeon: Darron Deatrice LABOR, MD;  Location: [facility];  Service: Cardiovascular;  Laterality: N/A;   LUMBAR LAMINECTOMY/DECOMPRESSION MICRODISCECTOMY N/A 12/22/2022   Procedure: POSTERIOR LUMBAR  DURAL REPAIR;  Surgeon: Georgina Ozell LABOR, MD;  Location: [facility] OR;  Service: Orthopedics;  Laterality: N/A;   RIGHT HEART CATH AND CORONARY ANGIOGRAPHY N/A 09/09/2024   Procedure: RIGHT HEART CATH AND CORONARY ANGIOGRAPHY;  Surgeon: Mady Bruckner, MD;  Location: [facility];  Service: Cardiovascular;  Laterality: N/A;   TONSILLECTOMY         Scheduled Meds:  furosemide   40 mg Intravenous Once   Continuous Infusions:  PRN Meds:   Allergies:   No Known Allergies  Social History:   Social History   Socioeconomic History   Marital status: Widowed    Spouse name: Not on file   Number of children: 4   Years of education: Not on file   Highest education level: Not on file  Occupational History   Occupation: reired  Tobacco Use   Smoking status: Former    Current packs/day: 0.00    Types: Cigarettes    Quit date: 11/04/1960    Years since quitting: 63.9   Smokeless tobacco: Never  Vaping Use   Vaping status: Never Used  Substance and Sexual Activity   Alcohol use: No   Drug use: No   Sexual activity: Not on file  Other Topics Concern   Not on file  Social History Narrative   Not on file   Social Drivers of Health   Financial Resource Strain: Low Risk  (12/01/2023)   Overall Financial Resource Strain (CARDIA)    Difficulty of Paying Living Expenses: Not hard at all  Food Insecurity: No Food Insecurity (09/20/2024)   Hunger Vital Sign    Worried About Running Out of Food in the Last Year: Never true    Ran Out of Food in the Last Year: Never true  Transportation Needs: No Transportation Needs (09/20/2024)   PRAPARE - Administrator, Civil Service (Medical): No    Lack of Transportation (Non-Medical): No  Physical Activity: Sufficiently Active (12/01/2023)   Exercise Vital Sign    Days of Exercise per Week: 3 days    Minutes of Exercise per Session: 120 min  Stress: No Stress Concern Present (12/01/2023)   [employer] of Occupational Health -  Occupational Stress Questionnaire    Feeling of Stress : Not at all  Social Connections: Unknown (09/15/2024)   Social Connection and Isolation Panel    Frequency of Communication with Friends and Family: Patient declined    Frequency of Social Gatherings with Friends and Family: Patient unable to answer    Attends Religious Services: Patient declined    Active Member of Clubs or Organizations: Patient declined    Attends Banker Meetings: Patient declined    Marital Status: Patient declined  Recent Concern: Social Connections - Moderately Isolated (06/26/2024)   Social Connection and Isolation Panel    Frequency of Communication with Friends and Family: More than three times a week    Frequency of Social Gatherings with Friends and Family: Twice a week    Attends Religious Services: More  than 4 times per year    Active Member of Clubs or Organizations: No    Attends Banker Meetings: Never    Marital Status: Widowed  Intimate Partner Violence: Not At Risk (09/20/2024)   Humiliation, Afraid, Rape, and Kick questionnaire    Fear of Current or Ex-Partner: No    Emotionally Abused: No    Physically Abused: No    Sexually Abused: No    Family History:   Family History  Problem Relation Age of Onset   Hypertension Mother    Hypertension Father    Allergic rhinitis Neg Hx    Angioedema Neg Hx    Asthma Neg Hx    Eczema Neg Hx    Immunodeficiency Neg Hx    Urticaria Neg Hx      ROS:  Please see the history of present illness.  All other ROS reviewed and negative.     Physical Exam/Data: Vitals:   09/26/24 1300 09/26/24 1518  BP: (!) 122/55 125/62  Pulse: 74 73  Resp: 14 (!) 27  Temp: (!) 97.4 F (36.3 C) 97.8 F (36.6 C)  TempSrc:  Temporal  SpO2: 98% 100%   No intake or output data in the 24 hours ending 09/26/24 1619    09/15/2024    7:32 AM 09/12/2024    6:09 AM 09/11/2024    5:24 AM  Last 3 Weights  Weight (lbs) 154 lb 149 lb 14.4 oz  151 lb 4.8 oz  Weight (kg) 69.854 kg 67.994 kg 68.629 kg     There is no height or weight on file to calculate BMI.  General:  Very pleasant older gentlemen in no acute distress HEENT: normal Neck: no JVD Vascular: No carotid bruits; Distal pulses 2+ bilaterally Cardiac:  normal S1, S2; RRR; no murmur  Lungs:  diminished breath sounds at bases Abd: soft, nontender, no hepatomegaly  Ext: no edema Musculoskeletal:  No deformities, BUE and BLE strength normal and equal Skin: warm and dry  Neuro:  CNs 2-12 intact, no focal abnormalities noted Psych:  Normal affect   EKG:  The EKG was personally reviewed and demonstrates:  see hpi Telemetry:  Telemetry was personally reviewed and demonstrates:  sinus rhythm , PVCs HR 65  Relevant CV Studies:  CCTA 09/23/24 IMPRESSION: 1. Annular measurements support a 23 mm S3 or 26 mm Evolut Pro.   2. Single, moderate calcification under the LCC extending into the LVOT.   3. Sufficient coronary to annulus distance.   4. Optimal Fluoroscopic Angle for Delivery: RAO 3 CAU 25   5. Moderate bilateral pleural effusions.   6. Dilated pulmonary artery suggestive of pulmonary hypertension.  LHC 09/15/24   Dist LM lesion is 30% stenosed.   Mid RCA to Dist RCA lesion is 85% stenosed.   Ramus lesion is 100% stenosed.   1st Diag lesion is 30% stenosed.   Ost Cx to Prox Cx lesion is 50% stenosed.   Ost LAD lesion is 85% stenosed.   A drug-eluting stent was successfully placed using a STENT SYNERGY XD [id number].   Post intervention, there is a 0% residual stenosis.   Post intervention, there is a 0% residual stenosis.   In the absence of any other complications or medical issues, we expect the patient to be ready for discharge from an interventional cardiology perspective on 09/16/2024.   1.  Successful high risk OCT guided orbital atherectomy of the ostial LAD and distal left main with drug-eluting stent placement extending all the  way back to the  ostium of the left main.  The ostium of the left circumflex appeared disease initially but no critical stenosis was noted after atherectomy.  Thus, I did not have to treat the left circumflex.  This was done with intra-aortic balloon pump support.  The intra-aortic balloon pump was removed at the end of the case and the site was already preclosed with 1 Pro-glide device.   Echocardiogram 09/26/24 IMPRESSIONS     1. Left ventricular ejection fraction, by estimation, is 40 to 45%. Left  ventricular ejection fraction by 2D MOD biplane is 46.1 %. The left  ventricle has mildly decreased function. The left ventricle demonstrates  regional wall motion abnormalities (see   scoring diagram/findings for description). Left ventricular diastolic  parameters are indeterminate.   2. Right ventricular systolic function is normal. The right ventricular  size is normal. There is normal pulmonary artery systolic pressure.   3. Left atrial size was severely dilated.   4. The mitral valve is degenerative. Moderate to severe mitral valve  regurgitation.   5. The aortic valve is calcified. Aortic valve regurgitation is mild.  Severe aortic valve stenosis. Aortic valve area, by VTI measures 0.86 cm.  Aortic valve mean gradient measures 35.0 mmHg. Aortic valve Vmax measures  3.98 m/s. Aortic valve  acceleration time measures 127 msec.   6. The inferior vena cava is normal in size with greater than 50%  respiratory variability, suggesting right atrial pressure of 3 mmHg.    Laboratory Data: High Sensitivity Troponin:   Recent Labs  Lab 09/06/24 2019 09/06/24 2326 09/07/24 0133 09/26/24 1348  TROPONINIHS 36* 38* 37* 68*     Chemistry Recent Labs  Lab 09/26/24 1348  NA 129*  K 4.6  CL 95*  CO2 20*  GLUCOSE 92  BUN 20  CREATININE 1.52*  CALCIUM  9.2  GFRNONAA 43*  ANIONGAP 14    Hematology Recent Labs  Lab 09/26/24 1348  WBC 9.5  RBC 4.26  HGB 13.4  HCT 39.6  MCV 93.0  MCH 31.5   MCHC 33.8  RDW 12.6  PLT 316   BNP Recent Labs  Lab 09/26/24 1348  BNP 1,363.0*     Radiology/Studies:  DG Chest 2 View Result Date: 09/26/2024 CLINICAL DATA:  sob EXAM: CHEST - 2 VIEW COMPARISON:  September 06, 2024 FINDINGS: The cardiomediastinal silhouette is unchanged in contour.Atherosclerotic calcifications of the aorta. Moderate LEFT and small RIGHT pleural effusion. No pneumothorax. Diffuse interstitial prominence. Visualized abdomen is unremarkable. Mild multilevel degenerative changes of the thoracic spine. IMPRESSION: Constellation of findings are favored to reflect pulmonary edema with moderate LEFT and small RIGHT pleural effusions. Electronically Signed   By: Corean Salter M.D.   On: 09/26/2024 14:43   CT ANGIO CHEST AORTA W/CM & OR WO/CM Result Date: 09/24/2024 CLINICAL DATA:  Aortic valve replacement, preoperative evaluation EXAM: CT ANGIOGRAPHY CHEST WITH CONTRAST TECHNIQUE: Multidetector CT imaging of the chest was performed using the standard protocol during bolus administration of intravenous contrast. Multiplanar CT image reconstructions and MIPs were obtained to evaluate the vascular anatomy. Multiplanar image (3D post-processing) reconstructions and MIPs were obtained to evaluate the vascular anatomy. RADIATION DOSE REDUCTION: This exam was performed according to the departmental dose-optimization program which includes automated exposure control, adjustment of the mA and/or kV according to patient size and/or use of iterative reconstruction technique. CONTRAST:  OMNIPAQUE  IOHEXOL  350 MG/ML SOLN COMPARISON:  None Available. FINDINGS: Cardiovascular: Enlarged heart. No pericardial effusion. Calcific atherosclerosis is present in  the left and right coronary territories. Partially calcified aortic valve. The tubular ascending thoracic aorta is estimated at 3.5 cm. The main pulmonary artery is at the upper limits of normal for diameter. Mediastinum/Nodes: No enlarged  mediastinal, hilar, or axillary lymph nodes. Thyroid  gland, trachea, and esophagus demonstrate no significant findings. Lungs/Pleura: Moderate bilateral pleural effusions. Upper Abdomen: Reported separately. Musculoskeletal: Degenerative changes in the imaged osseous structures. Review of the MIP images confirms the above findings. IMPRESSION: 1. Native coronary artery atherosclerotic vascular disease. 2. No thoracic aortic aneurysm or dissection. 3. Moderate bilateral pleural effusions with associated consolidation. Electronically Signed   By: Maude Naegeli M.D.   On: 09/24/2024 16:52   CT ANGIO ABDOMEN PELVIS  W & WO CONTRAST Result Date: 09/24/2024 CLINICAL DATA:  Aortic valve replacement, preoperative evaluation EXAM: CTA ABDOMEN AND PELVIS WITHOUT AND WITH CONTRAST TECHNIQUE: Multidetector CT imaging of the abdomen and pelvis was performed using the standard protocol during bolus administration of intravenous contrast. Multiplanar reconstructed images and MIPs were obtained and reviewed to evaluate the vascular anatomy. RADIATION DOSE REDUCTION: This exam was performed according to the departmental dose-optimization program which includes automated exposure control, adjustment of the mA and/or kV according to patient size and/or use of iterative reconstruction technique. CONTRAST:  OMNIPAQUE  IOHEXOL  350 MG/ML SOLN COMPARISON:  None Available. FINDINGS: VASCULAR Aorta: Mild diffuse atherosclerotic changes are present in the abdominal aorta which are both calcified and fibrofatty. There is a focal narrowing in the juxtarenal segment with minimal diameter estimated at 9 mm. Minimal diameter of the infrarenal segment is estimated 10 mm. 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 SMA: Mild atherosclerotic changes in the proximal segment, patent. Renals: A moderate stenosis is present in the proximal right main renal artery secondary to atherosclerotic  plaque. The left renal artery is patent. IMA: Patent. Inflow: The right common iliac artery is patent with mild atherosclerotic changes in minimal luminal diameter estimated at 6 mm. The right internal iliac artery is patent. The right external iliac artery is patent with minimal luminal diameter estimated at 6 mm. Left common iliac artery is patent with minimal luminal diameter estimated at 6 mm. Left internal iliac artery is patent. The left external iliac artery is patent with minimal luminal diameter estimated at 6 mm. Proximal Outflow: The right common femoral artery is patent with minimal luminal diameter estimated at 7 mm. The left common femoral artery is patent with minimal luminal diameter estimated at 6 mm. Veins: No obvious venous abnormality within the limitations of this arterial phase study. Review of the MIP images confirms the above findings. NON-VASCULAR Lower chest: Reported separately. Hepatobiliary: No focal liver abnormality is seen. No gallstones, gallbladder wall thickening, or biliary dilatation. Mildly nodular liver surface contour. Pancreas: Unremarkable. No pancreatic ductal dilatation or surrounding inflammatory changes. Spleen: Normal in size without focal abnormality. Adrenals/Urinary Tract: Adrenal glands are unremarkable. Kidneys are normal, without renal calculi, focal lesion, or hydronephrosis. Bladder is unremarkable. Stomach/Bowel: No dilated loops of bowel are seen. Scattered colonic diverticulosis. Lymphatic: No evidence of significant lymphadenopathy. Reproductive: Prostate size is within normal limits. Other: Bilateral fat containing inguinal hernias. Musculoskeletal: Mild degenerative changes are present in the lumbar spine. IMPRESSION: 1. Minimal luminal diameter is detailed above. 2. 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 3. Moderate stenosis in the proximal right main renal artery. Electronically Signed   By: Maude Naegeli M.D.   On: 09/24/2024 16:49   CT  CORONARY MORPH  W/CTA COR W/SCORE W/CA W/CM &/OR WO/CM Result Date: 09/23/2024 CLINICAL DATA:  Severe Aortic Stenosis. EXAM: Cardiac TAVR CT TECHNIQUE: A non-contrast, gated CT scan was obtained with axial slices of 2.5 mm through the heart for aortic valve scoring. A 80 kV retrospective, gated, contrast cardiac scan was obtained. Gantry rotation speed was 230 msec and collimation was 0.63 mm. Nitroglycerin  was not given. The 3D dataset was reconstructed in systole with motion correction. The 3D data set was reconstructed in 5% intervals of the 0-95% of the R-R cycle. Systolic and diastolic phases were analyzed on a dedicated workstation using MPR, MIP, and VRT modes. The patient received 100 cc of contrast. FINDINGS: Image quality: Excellent. Noise artifact is: Limited. Valve Morphology: Tricuspid aortic valve with severely calcified leaflets and severely restricted leaflet motion in systole. Aortic Valve Calcium  score: 3155 Aortic annular dimension: Phase assessed: 25% Annular area: 429 mm2 Annular perimeter: 75.0 mm Max diameter: 26.8 mm Min diameter: 20.8 mm Annular and subannular calcification: Single, moderate calcification under the LCC extending into the LVOT. Membranous septum length: 7.5 mm Optimal coplanar projection: RAO 3 CAU 25 Coronary Artery Height above Annulus: Left Main: 14.9 mm Right Coronary: 23.3 mm Sinus of Valsalva Measurements: Non-coronary: 31 mm Right-coronary: 31 mm Left-coronary: 31 mm Sinus of Valsalva Height: Non-coronary: 27.3 mm Right-coronary: 29.2 mm Left-coronary: 23.4 mm Sinotubular Junction: 30 mm Ascending Thoracic Aorta: 35 mm Coronary Arteries: Normal coronary origin. Right dominance. The study was performed without use of NTG and is insufficient for plaque evaluation. Please refer to recent cardiac catheterization for coronary assessment. Severe coronary calcifications. Cardiac Morphology: Right Atrium: Right atrial size is within normal limits. Contrast reflux into the IVC  consistent with elevated RA pressure. Right Ventricle: The right ventricular cavity is within normal limits. Left Atrium: Left atrial size is normal in size with no left atrial appendage filling defect. Small PFO. Left Ventricle: The ventricular cavity size is within normal limits. Pulmonary arteries: Dilated pulmonary artery suggestive of pulmonary hypertension. Pulmonary veins: Normal pulmonary venous drainage. Pericardium: Normal thickness with no significant effusion or calcium  present. Mitral Valve: The mitral valve is normal structure without significant calcification. Extra-cardiac findings: Moderate bilateral pleural effusions. See attached radiology report for non-cardiac structures. IMPRESSION: 1. Annular measurements support a 23 mm S3 or 26 mm Evolut Pro. 2. Single, moderate calcification under the LCC extending into the LVOT. 3. Sufficient coronary to annulus distance. 4. Optimal Fluoroscopic Angle for Delivery: RAO 3 CAU 25 5. Moderate bilateral pleural effusions. 6. Dilated pulmonary artery suggestive of pulmonary hypertension. Darryle T. Barbaraann, MD Electronically Signed   By: Darryle Barbaraann M.D.   On: 09/23/2024 13:32     Assessment and Plan:  Acute on chronic systolic HF Severe Aortic Stenosis Patient currently undergoing active TAVR work-up.  Presenting with worsening shortness of breath, particularly orthopnea Imaging showed volume overload BNP 1363  Agree with IV lasix  40 mg this afternoon, would monitor UOP and continue to trend hyponatremia I reached out to structural heart team about seeing patient during admission.   Elevated Troponin Suspect 2/2 demand ischemia, can trend 2nd troponin.   CAD s/p recent stenting to LAD, hx of stenting to RCA Denied chest pain Continue ASA 81 mg Continue plavix  75 mg  Hyperlipidemia Continue lipitor 40 mg  Per primary Hypothyroidism  Risk Assessment/Risk Scores:       [place]  Heart Association (NYHA) Functional Class NYHA  Class III   For questions or updates, please contact [facility] Please consult  [email or web address] for contact info under      Signed, Leontine LOISE Salen, PA-C  09/26/2024 4:19 PM

## 2024-09-26 NOTE — ED Provider Triage Note (Signed)
 Emergency Medicine Provider Triage Evaluation Note  [name] , a 89 y.o. male  was evaluated in triage.  Pt complains of sob. Progressive worsening SOB x 3 days.  Having productive cough, stomach is sore from coughing.  No fever, chills, sore throat, cp, back pain.  Recent had stent placement.  Report hx of CHF.  No prior PE  Review of Systems  Positive: As above Negative: As above  Physical Exam  BP (!) 122/55   Pulse 74   Temp (!) 97.4 F (36.3 C)   Resp 14   SpO2 98%  Gen:   Awake, no distress   Resp:  Normal effort  MSK:   Moves extremities without difficulty  Other:    Medical Decision Making  Medically screening exam initiated at 1:29 PM.  Appropriate orders placed.  [name] was informed that the remainder of the evaluation will be completed by another provider, this initial triage assessment does not replace that evaluation, and the importance of remaining in the ED until their evaluation is complete.     Gregory Colon, PA-C 09/26/24 1330

## 2024-09-27 ENCOUNTER — Ambulatory Visit: Payer: Self-pay | Admitting: Cardiovascular Disease

## 2024-09-27 ENCOUNTER — Encounter (HOSPITAL_COMMUNITY): Payer: Self-pay | Admitting: Internal Medicine

## 2024-09-27 ENCOUNTER — Ambulatory Visit: Admitting: Cardiovascular Disease

## 2024-09-27 DIAGNOSIS — I350 Nonrheumatic aortic (valve) stenosis: Secondary | ICD-10-CM | POA: Diagnosis not present

## 2024-09-27 DIAGNOSIS — I2510 Atherosclerotic heart disease of native coronary artery without angina pectoris: Secondary | ICD-10-CM

## 2024-09-27 DIAGNOSIS — I2489 Other forms of acute ischemic heart disease: Secondary | ICD-10-CM | POA: Diagnosis not present

## 2024-09-27 DIAGNOSIS — I5043 Acute on chronic combined systolic (congestive) and diastolic (congestive) heart failure: Secondary | ICD-10-CM

## 2024-09-27 DIAGNOSIS — I509 Heart failure, unspecified: Secondary | ICD-10-CM

## 2024-09-27 DIAGNOSIS — I340 Nonrheumatic mitral (valve) insufficiency: Secondary | ICD-10-CM

## 2024-09-27 DIAGNOSIS — I25118 Atherosclerotic heart disease of native coronary artery with other forms of angina pectoris: Secondary | ICD-10-CM

## 2024-09-27 DIAGNOSIS — Z9861 Coronary angioplasty status: Secondary | ICD-10-CM

## 2024-09-27 LAB — COMPREHENSIVE METABOLIC PANEL WITH GFR
ALT: 18 U/L (ref 0–44)
AST: 23 U/L (ref 15–41)
Albumin: 3.5 g/dL (ref 3.5–5.0)
Alkaline Phosphatase: 74 U/L (ref 38–126)
Anion gap: 12 (ref 5–15)
BUN: 19 mg/dL (ref 8–23)
CO2: 23 mmol/L (ref 22–32)
Calcium: 8.9 mg/dL (ref 8.9–10.3)
Chloride: 94 mmol/L — ABNORMAL LOW (ref 98–111)
Creatinine, Ser: 1.52 mg/dL — ABNORMAL HIGH (ref 0.61–1.24)
GFR, Estimated: 43 mL/min — ABNORMAL LOW (ref >60)
Glucose, Bld: 108 mg/dL — ABNORMAL HIGH (ref 70–99)
Potassium: 3.9 mmol/L (ref 3.5–5.1)
Sodium: 129 mmol/L — ABNORMAL LOW (ref 135–145)
Total Bilirubin: 0.9 mg/dL (ref 0.0–1.2)
Total Protein: 6.4 g/dL — ABNORMAL LOW (ref 6.5–8.1)

## 2024-09-27 LAB — MAGNESIUM: Magnesium: 2 mg/dL (ref 1.7–2.4)

## 2024-09-27 MED ORDER — FUROSEMIDE 10 MG/ML IJ SOLN
40.0000 mg | Freq: Two times a day (BID) | INTRAMUSCULAR | Status: DC
  Administered 2024-09-27 – 2024-09-28 (×3): 40 mg via INTRAVENOUS
  Filled 2024-09-27 (×3): qty 4

## 2024-09-27 NOTE — H&P (View-Only) (Signed)
 Rounding Note   Patient Name: [name] Date of Encounter: 09/27/2024  [facility] Cardiologist: Lonni Cash, MD Pernell Fell, MD  Patient Profile   89 y.o. male  with a hx of CAD s/p stenting to RCA & lm-lad (with residual RCA stenosis at distal stent edge), DM, HTN, HLD, hypothyroidism and severe AS pending TAVR who presented 09/26/24 with dyspnea/orthopnea & PND -> acute on chronic HF most likely related to Severe AS.  He recently had LM-LAD atherectomy -PCI 09/15/24.  Is scheduled to see Dr. Fell 09/27/2024.   He denied any chest pain.  Troponin level was 68 and 64 but BNP was 1300.  Unlikely ACS more consistent with acute on chronic heart failure related to AAS. -> Given 1 dose of IV Lasix  yesterday  Assessment & Plan  Acute HRmrEF 2/2 Severe AS-complicated by MR. Discussed with valve team to see his TAVR can be expedited -> [employer], logistics suspected may not be possible to get it done until planned date and time which is next week-December 2. Will plan IV diuresis today 40 mg IV twice daily, and will reassess volume status with right heart catheterization tomorrow either with Dr. Fell or Advanced Heart Failure Team in order to help strategize on the best way to temporize the patient until planned TAVR. Will reassess clinically and with right heart cath on 09/28/2024.  CAD-recent PCI: Elevated troponin from demand ischemia. Agree that elevated troponin is related to demand ischemia and not ACS Recent LM and LAD PCI with residual RCA disease,.  Based on the fact he is not having active angina, I do not think that the RCA lesion is in question. Continue aspirin  and Plavix . Continue Lipitor.   Informed Consent   Shared Decision Making/Informed Consent The risks, including but not limited to, [bleeding or vascular complications (1 in 500), pneumothorax (1 in 1600), arrhythmia (1 in 1000) and death (1 in 5000)], benefits (diagnostic support and/or  management of heart failure, pulmonary hypertension) and alternatives of a right heart catheterization were discussed in detail with [name] and he is willing to proceed.     ===================== Subjective Overall feels little better today, slept a little bit last night. Still has this chronic nagging cough with mild sputum production.  Scheduled Meds:  aspirin  EC  81 mg Oral Daily   atorvastatin   40 mg Oral Daily   clopidogrel   75 mg Oral Daily   heparin   5,000 Units Subcutaneous Q8H   levothyroxine   88 mcg Oral Q0600   Continuous Infusions:  PRN Meds: acetaminophen  **OR** acetaminophen    Vital Signs  Vitals:   09/27/24 0400 09/27/24 0515 09/27/24 0759 09/27/24 0815  BP: (!) 136/50 (!) 114/54  111/65  Pulse: 79 81  80  Resp: (!) 26 (!) 27  (!) 22  Temp:   (!) 97 F (36.1 C)   TempSrc:   Oral   SpO2: 100% 94%  100%   No intake or output data in the 24 hours ending 09/27/24 0837    09/15/2024    7:32 AM 09/12/2024    6:09 AM 09/11/2024    5:24 AM  Last 3 Weights  Weight (lbs) 154 lb 149 lb 14.4 oz 151 lb 4.8 oz  Weight (kg) 69.854 kg 67.994 kg 68.629 kg      Telemetry Sinus rhythm- Personally Reviewed  ECG  No current EKG- Personally Reviewed  Physical Exam  GEN: No acute distress.  Sitting up in bed, little bit less orthopneic last night Neck:  Elevated JVD while seated Cardiac: RRR, harsh 3-4/SEM at RUSB-With delayed upstroke.  Also noted 2-3/6 HSM at apex.  No rubs or gallops. Respiratory: Clear to auscultation bilaterally.  Nonlabored GI: Soft, nontender, non-distended  MS: No edema; No deformity. Neuro:  Nonfocal  Psych: Normal affect   Labs High Sensitivity Troponin:   Recent Labs  Lab 09/06/24 2019 09/06/24 2326 09/07/24 0133 09/26/24 1348 09/26/24 1623  TROPONINIHS 36* 38* 37* 68* 64*     Chemistry Recent Labs  Lab 09/26/24 1348 09/27/24 0518  NA 129* 129*  K 4.6 3.9  CL 95* 94*  CO2 20* 23  GLUCOSE 92 108*  BUN 20 19   CREATININE 1.52* 1.52*  CALCIUM  9.2 8.9  MG  --  2.0  PROT  --  6.4*  ALBUMIN  --  3.5  AST  --  23  ALT  --  18  ALKPHOS  --  74  BILITOT  --  0.9  GFRNONAA 43* 43*  ANIONGAP 14 12    Lipids No results for input(s): CHOL, TRIG, HDL, LABVLDL, LDLCALC, CHOLHDL in the last 168 hours.  Hematology Recent Labs  Lab 09/26/24 1348  WBC 9.5  RBC 4.26  HGB 13.4  HCT 39.6  MCV 93.0  MCH 31.5  MCHC 33.8  RDW 12.6  PLT 316   Thyroid  No results for input(s): TSH, FREET4 in the last 168 hours.  BNP Recent Labs  Lab 09/26/24 1348  BNP 1,363.0*    DDimer No results for input(s): DDIMER in the last 168 hours.   Radiology  DG Chest 2 View Result Date: 09/26/2024 CLINICAL DATA:  sob EXAM: CHEST - 2 VIEW COMPARISON:  September 06, 2024 FINDINGS: The cardiomediastinal silhouette is unchanged in contour.Atherosclerotic calcifications of the aorta. Moderate LEFT and small RIGHT pleural effusion. No pneumothorax. Diffuse interstitial prominence. Visualized abdomen is unremarkable. Mild multilevel degenerative changes of the thoracic spine. IMPRESSION: Constellation of findings are favored to reflect pulmonary edema with moderate LEFT and small RIGHT pleural effusions. Electronically Signed   By: Corean Salter M.D.   On: 09/26/2024 14:43    Cardiac Studies Echo 09/07/24: EF 40-45%.  Mid to distal lateral, posterior, apical septal and anterior apical and apical inferior hypokinesis.  Calcified aortic valve with severe valve stenosis.  Mean AVG 35 mmHg with a valve area by VTI of 0.86 cm.  Severe LA dilation.  Moderate to severe MR.  Normal PAP. Cath & Staged PCI (11/6 & 09/15/24)  11/6: Severe MV CAD including 80 to 90% ostial LAD, CTO of RI and 89% ISR to distal edge of the proximal RCA stent.  Normal left and right heart filling pressures.  Mild pulm hypertension. 11/12: High risk  (balloon pump supported) OCT atherectomy of ostial LAD and distal LM with DES PCI Synergy  XD 3.5 x 16 mm   For questions or updates, please contact [facility] Please consult [email or web address] for contact info under       Signed, Alm Clay, MD  09/27/2024, 8:37 AM

## 2024-09-27 NOTE — Plan of Care (Signed)

## 2024-09-27 NOTE — Plan of Care (Signed)
   Problem: Clinical Measurements: Goal: Diagnostic test results will improve Outcome: Progressing

## 2024-09-27 NOTE — Progress Notes (Signed)
 PROGRESS NOTE    [name]  FMW:[id number] DOB: 06/12/1930 DOA: 09/26/2024 PCP: [name], NP  89/M with systolic CHF, recently hospitalized and diagnosed with multivessel CAD and severe aortic stenosis, improved with diuresis, seen by structural heart team and discharged home for outpatient PCI followed by TAVR. - Underwent left main-LAD arthrectomy and PCI/stenting on 09/15/2024, discharged home thereafter and was scheduled to see Dr. Wonda in clinic today 11/24. 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 Severe aortic stenosis Moderate to severe mitral regurgitation -Last echo 11/25 noted EF 40-45%, wall motion abnormality, normal RV, moderate to severe mitral regurgitation and severe aortic stenosis -Readmitted with volume overload, continue IV Lasix  today - Cautious with GDMT considering severe aortic stenosis - Plan for RHC  Multivessel CAD -Recent arthrectomy of ostial LAD and distal left main with drug-eluting stent 11/12 has 85% mid to distal RCA disease - No ACS at this time, continue aspirin  Plavix  and statin  CKD 3a Stable  Hypothyroidism Continue Synthroid     DVT prophylaxis: Heparin  subcutaneous Code Status: Full code Family Communication: None present Disposition Plan: Home pending above workup  Consultants:    Procedures:   Antimicrobials:    Objective: Vitals:   09/27/24 0759 09/27/24 0815 09/27/24 1157 09/27/24 1245  BP:  111/65  (!) 123/48  Pulse:  80  64  Resp:  (!) 22  15  Temp: (!) 97 F (36.1 C)  97.9 F (36.6 C)   TempSrc: Oral  Oral   SpO2:  100%  98%   No intake or output data in the 24 hours ending 09/27/24 1258 There were no vitals filed for this  visit.  Examination:  General exam: Appears calm and comfortable  Respiratory system: Clear to auscultation Cardiovascular system: S1 & S2 heard, RRR.  Systolic murmur Abd: nondistended, soft and nontender.Normal bowel sounds heard. Central nervous system: Alert and oriented. No focal neurological deficits. Extremities: no edema Skin: No rashes Psychiatry:  Mood & affect appropriate.     Data Reviewed:   CBC: Recent Labs  Lab 09/26/24 1348  WBC 9.5  NEUTROABS 7.1  HGB 13.4  HCT 39.6  MCV 93.0  PLT 316   Basic Metabolic Panel: Recent Labs  Lab 09/26/24 1348 09/27/24 0518  NA 129* 129*  K 4.6 3.9  CL 95* 94*  CO2 20* 23  GLUCOSE 92 108*  BUN 20 19  CREATININE 1.52* 1.52*  CALCIUM  9.2 8.9  MG  --  2.0   GFR: Estimated Creatinine Clearance: 31.3 mL/min (A) (by C-G formula based on SCr of 1.52 mg/dL (H)). Liver Function Tests: Recent Labs  Lab 09/27/24 0518  AST 23  ALT 18  ALKPHOS 74  BILITOT 0.9  PROT 6.4*  ALBUMIN 3.5   No results for input(s): LIPASE, AMYLASE in the last 168 hours. No results for input(s): AMMONIA in the last 168 hours. Coagulation Profile: No results for input(s): INR, PROTIME in the last 168 hours. Cardiac Enzymes: No results for input(s): CKTOTAL, CKMB, CKMBINDEX, TROPONINI in the last 168 hours. BNP (last 3 results) Recent Labs    09/06/24 1530  PROBNP 6,771*   HbA1C: No results for input(s): HGBA1C in the last 72 hours. CBG: No results for input(s): GLUCAP in the last 168 hours. Lipid  Profile: No results for input(s): CHOL, HDL, LDLCALC, TRIG, CHOLHDL, LDLDIRECT in the last 72 hours. Thyroid  Function Tests: No results for input(s): TSH, T4TOTAL, FREET4, T3FREE, THYROIDAB in the last 72 hours. Anemia Panel: No results for input(s): VITAMINB12, FOLATE, FERRITIN, TIBC, IRON, RETICCTPCT in the last 72 hours. Urine analysis:    Component Value Date/Time   COLORURINE  YELLOW 06/26/2024 0233   APPEARANCEUR CLEAR 06/26/2024 0233   LABSPEC 1.018 06/26/2024 0233   PHURINE 5.0 06/26/2024 0233   GLUCOSEU NEGATIVE 06/26/2024 0233   HGBUR NEGATIVE 06/26/2024 0233   BILIRUBINUR NEGATIVE 06/26/2024 0233   KETONESUR NEGATIVE 06/26/2024 0233   PROTEINUR NEGATIVE 06/26/2024 0233   NITRITE NEGATIVE 06/26/2024 0233   LEUKOCYTESUR NEGATIVE 06/26/2024 0233   Sepsis Labs: @LABRCNTIP (procalcitonin:4,lacticidven:4)  ) Recent Results (from the past 240 hours)  Resp panel by RT-PCR (RSV, Flu A&B, Covid) Anterior Nasal Swab     Status: None   Collection Time: 09/26/24  1:30 PM   Specimen: Anterior Nasal Swab  Result Value Ref Range Status   SARS Coronavirus 2 by RT PCR NEGATIVE NEGATIVE Final   Influenza A by PCR NEGATIVE NEGATIVE Final   Influenza B by PCR NEGATIVE NEGATIVE Final    Comment: (NOTE) The Xpert Xpress SARS-CoV-2/FLU/RSV plus assay is intended as an aid in the diagnosis of influenza from Nasopharyngeal swab specimens and should not be used as a sole basis for treatment. Nasal washings and aspirates are unacceptable for Xpert Xpress SARS-CoV-2/FLU/RSV testing.  Fact Sheet for Patients: bloggercourse.com  Fact Sheet for Healthcare Providers: seriousbroker.it  This test is not yet approved or cleared by the [place]  FDA and has been authorized for detection and/or diagnosis of SARS-CoV-2 by FDA under an Emergency Use Authorization (EUA). This EUA will remain in effect (meaning this test can be used) for the duration of the COVID-19 declaration under Section 564(b)(1) of the Act, 21 U.S.C. section 360bbb-3(b)(1), unless the authorization is terminated or revoked.     Resp Syncytial Virus by PCR NEGATIVE NEGATIVE Final    Comment: (NOTE) Fact Sheet for Patients: bloggercourse.com  Fact Sheet for Healthcare Providers: seriousbroker.it  This  test is not yet approved or cleared by the [place]  FDA and has been authorized for detection and/or diagnosis of SARS-CoV-2 by FDA under an Emergency Use Authorization (EUA). This EUA will remain in effect (meaning this test can be used) for the duration of the COVID-19 declaration under Section 564(b)(1) of the Act, 21 U.S.C. section 360bbb-3(b)(1), unless the authorization is terminated or revoked.  Performed at [facility], [address], [place], [place] [address]      Radiology Studies: DG Chest 2 View Result Date: 09/26/2024 CLINICAL DATA:  sob EXAM: CHEST - 2 VIEW COMPARISON:  September 06, 2024 FINDINGS: The cardiomediastinal silhouette is unchanged in contour.Atherosclerotic calcifications of the aorta. Moderate LEFT and small RIGHT pleural effusion. No pneumothorax. Diffuse interstitial prominence. Visualized abdomen is unremarkable. Mild multilevel degenerative changes of the thoracic spine. IMPRESSION: Constellation of findings are favored to reflect pulmonary edema with moderate LEFT and small RIGHT pleural effusions. Electronically Signed   By: Corean Salter M.D.   On: 09/26/2024 14:43     Scheduled Meds:  aspirin  EC  81 mg Oral Daily   atorvastatin   40 mg Oral Daily   clopidogrel   75 mg Oral Daily   furosemide   40 mg Intravenous BID   heparin   5,000 Units Subcutaneous Q8H   levothyroxine   88 mcg Oral Q0600   Continuous Infusions:  LOS: 1 day    Time spent:    Sigurd Pac, MD [facility]   09/27/2024, 12:58 PM

## 2024-09-27 NOTE — TOC CM/SW Note (Signed)
 TOC consult received for possible d/c needs SNF vs HH care. Unit CM to assess as appropriate.   Merilee Batty, MSN, RN Case Management [phone]

## 2024-09-27 NOTE — ED Notes (Signed)
 Labs sent

## 2024-09-27 NOTE — Progress Notes (Signed)
 [facility] Brief Progress Note   _____________________________________________________________________________________________________________  Patient Name: [name] Patient DOB: 04/14/1930 Date: @TODAY @      Data: 89 yo male with Aortic stenosis CAD, CHF, T2DM, admitted with CHF, VS better, labs better.      Action: Requested downgrade.     Response:  MD confirmed downgrade, will change orders.    _____________________________________________________________________________________________________________  [facility] RN Expeditor Sharolyn JONETTA Batman Please contact us  directly via secure chat (search for [facility]) or by calling us  at [phone] [facility]).

## 2024-09-27 NOTE — ED Notes (Signed)
 Emptied from urinal

## 2024-09-27 NOTE — Progress Notes (Addendum)
 Rounding Note   Patient Name: [name] Date of Encounter: 09/27/2024  [facility] Cardiologist: Gregory Cash, MD Gregory Fell, MD  Patient Profile   89 y.o. male  with a hx of CAD s/p stenting to RCA & lm-lad (with residual RCA stenosis at distal stent edge), DM, HTN, HLD, hypothyroidism and severe AS pending TAVR who presented 09/26/24 with dyspnea/orthopnea & PND -> acute on chronic HF most likely related to Severe AS.  He recently had LM-LAD atherectomy -PCI 09/15/24.  Is scheduled to see Dr. Fell 09/27/2024.   He denied any chest pain.  Troponin level was 68 and 64 but BNP was 1300.  Unlikely ACS more consistent with acute on chronic heart failure related to AAS. -> Given 1 dose of IV Lasix  yesterday  Assessment & Plan  Acute HRmrEF 2/2 Severe AS-complicated by MR. Discussed with valve team to see his TAVR can be expedited -> [employer], logistics suspected may not be possible to get it done until planned date and time which is next week-December 2. Will plan IV diuresis today 40 mg IV twice daily, and will reassess volume status with right heart catheterization tomorrow either with Dr. Fell or Advanced Heart Failure Team in order to help strategize on the best way to temporize the patient until planned TAVR. Will reassess clinically and with right heart cath on 09/28/2024.  CAD-recent PCI: Elevated troponin from demand ischemia. Agree that elevated troponin is related to demand ischemia and not ACS Recent LM and LAD PCI with residual RCA disease,.  Based on the fact he is not having active angina, I do not think that the RCA lesion is in question. Continue aspirin  and Plavix . Continue Lipitor.   Informed Consent   Shared Decision Making/Informed Consent The risks, including but not limited to, [bleeding or vascular complications (1 in 500), pneumothorax (1 in 1600), arrhythmia (1 in 1000) and death (1 in 5000)], benefits (diagnostic support and/or  management of heart failure, pulmonary hypertension) and alternatives of a right heart catheterization were discussed in detail with [name] and he is willing to proceed.     ===================== Subjective Overall feels little better today, slept a little bit last night. Still has this chronic nagging cough with mild sputum production.  Scheduled Meds:  aspirin  EC  81 mg Oral Daily   atorvastatin   40 mg Oral Daily   clopidogrel   75 mg Oral Daily   heparin   5,000 Units Subcutaneous Q8H   levothyroxine   88 mcg Oral Q0600   Continuous Infusions:  PRN Meds: acetaminophen  **OR** acetaminophen    Vital Signs  Vitals:   09/27/24 0400 09/27/24 0515 09/27/24 0759 09/27/24 0815  BP: (!) 136/50 (!) 114/54  111/65  Pulse: 79 81  80  Resp: (!) 26 (!) 27  (!) 22  Temp:   (!) 97 F (36.1 C)   TempSrc:   Oral   SpO2: 100% 94%  100%   No intake or output data in the 24 hours ending 09/27/24 0837    09/15/2024    7:32 AM 09/12/2024    6:09 AM 09/11/2024    5:24 AM  Last 3 Weights  Weight (lbs) 154 lb 149 lb 14.4 oz 151 lb 4.8 oz  Weight (kg) 69.854 kg 67.994 kg 68.629 kg      Telemetry Sinus rhythm- Personally Reviewed  ECG  No current EKG- Personally Reviewed  Physical Exam  GEN: No acute distress.  Sitting up in bed, little bit less orthopneic last night Neck:  Elevated JVD while seated Cardiac: RRR, harsh 3-4/SEM at RUSB-With delayed upstroke.  Also noted 2-3/6 HSM at apex.  No rubs or gallops. Respiratory: Clear to auscultation bilaterally.  Nonlabored GI: Soft, nontender, non-distended  MS: No edema; No deformity. Neuro:  Nonfocal  Psych: Normal affect   Labs High Sensitivity Troponin:   Recent Labs  Lab 09/06/24 2019 09/06/24 2326 09/07/24 0133 09/26/24 1348 09/26/24 1623  TROPONINIHS 36* 38* 37* 68* 64*     Chemistry Recent Labs  Lab 09/26/24 1348 09/27/24 0518  NA 129* 129*  K 4.6 3.9  CL 95* 94*  CO2 20* 23  GLUCOSE 92 108*  BUN 20 19   CREATININE 1.52* 1.52*  CALCIUM  9.2 8.9  MG  --  2.0  PROT  --  6.4*  ALBUMIN  --  3.5  AST  --  23  ALT  --  18  ALKPHOS  --  74  BILITOT  --  0.9  GFRNONAA 43* 43*  ANIONGAP 14 12    Lipids No results for input(s): CHOL, TRIG, HDL, LABVLDL, LDLCALC, CHOLHDL in the last 168 hours.  Hematology Recent Labs  Lab 09/26/24 1348  WBC 9.5  RBC 4.26  HGB 13.4  HCT 39.6  MCV 93.0  MCH 31.5  MCHC 33.8  RDW 12.6  PLT 316   Thyroid  No results for input(s): TSH, FREET4 in the last 168 hours.  BNP Recent Labs  Lab 09/26/24 1348  BNP 1,363.0*    DDimer No results for input(s): DDIMER in the last 168 hours.   Radiology  DG Chest 2 View Result Date: 09/26/2024 CLINICAL DATA:  sob EXAM: CHEST - 2 VIEW COMPARISON:  September 06, 2024 FINDINGS: The cardiomediastinal silhouette is unchanged in contour.Atherosclerotic calcifications of the aorta. Moderate LEFT and small RIGHT pleural effusion. No pneumothorax. Diffuse interstitial prominence. Visualized abdomen is unremarkable. Mild multilevel degenerative changes of the thoracic spine. IMPRESSION: Constellation of findings are favored to reflect pulmonary edema with moderate LEFT and small RIGHT pleural effusions. Electronically Signed   By: [name] M.D.   On: 09/26/2024 14:43    Cardiac Studies Echo 09/07/24: EF 40-45%.  Mid to distal lateral, posterior, apical septal and anterior apical and apical inferior hypokinesis.  Calcified aortic valve with severe valve stenosis.  Mean AVG 35 mmHg with a valve area by VTI of 0.86 cm.  Severe LA dilation.  Moderate to severe MR.  Normal PAP. Cath & Staged PCI (11/6 & 09/15/24)  11/6: Severe MV CAD including 80 to 90% ostial LAD, CTO of RI and 89% ISR to distal edge of the proximal RCA stent.  Normal left and right heart filling pressures.  Mild pulm hypertension. 11/12: High risk  (balloon pump supported) OCT atherectomy of ostial LAD and distal LM with DES PCI Synergy  XD 3.5 x 16 mm   For questions or updates, please contact [facility] Please consult [email or web address] for contact info under       Signed, Gregory Clay, MD  09/27/2024, 8:37 AM

## 2024-09-27 NOTE — ED Notes (Signed)
 Placed on 2L [place] due to oxygen decreasing while sleeping

## 2024-09-28 ENCOUNTER — Other Ambulatory Visit (HOSPITAL_COMMUNITY): Payer: Self-pay

## 2024-09-28 ENCOUNTER — Encounter (HOSPITAL_COMMUNITY)
Admission: EM | Disposition: A | Discharge status: Home / Self Care | Payer: Self-pay | Source: Home / Self Care | Attending: Internal Medicine

## 2024-09-28 DIAGNOSIS — I350 Nonrheumatic aortic (valve) stenosis: Secondary | ICD-10-CM | POA: Diagnosis not present

## 2024-09-28 DIAGNOSIS — I5023 Acute on chronic systolic (congestive) heart failure: Secondary | ICD-10-CM | POA: Diagnosis not present

## 2024-09-28 HISTORY — PX: RIGHT HEART CATH: CATH118263

## 2024-09-28 LAB — POCT I-STAT EG7
Acid-Base Excess: 1 mmol/L (ref 0.0–2.0)
Acid-Base Excess: 4 mmol/L — ABNORMAL HIGH (ref 0.0–2.0)
Bicarbonate: 24.7 mmol/L (ref 20.0–28.0)
Bicarbonate: 27.8 mmol/L (ref 20.0–28.0)
Calcium, Ion: 1.07 mmol/L — ABNORMAL LOW (ref 1.15–1.40)
Calcium, Ion: 1.16 mmol/L (ref 1.15–1.40)
HCT: 38 % — ABNORMAL LOW (ref 39.0–52.0)
HCT: 39 % (ref 39.0–52.0)
Hemoglobin: 12.9 g/dL — ABNORMAL LOW (ref 13.0–17.0)
Hemoglobin: 13.3 g/dL (ref 13.0–17.0)
O2 Saturation: 57 %
O2 Saturation: 58 %
Potassium: 3.4 mmol/L — ABNORMAL LOW (ref 3.5–5.1)
Potassium: 3.6 mmol/L (ref 3.5–5.1)
Sodium: 130 mmol/L — ABNORMAL LOW (ref 135–145)
Sodium: 133 mmol/L — ABNORMAL LOW (ref 135–145)
TCO2: 26 mmol/L (ref 22–32)
TCO2: 29 mmol/L (ref 22–32)
pCO2, Ven: 36.7 mmHg — ABNORMAL LOW (ref 44–60)
pCO2, Ven: 38.5 mmHg — ABNORMAL LOW (ref 44–60)
pH, Ven: 7.436 — ABNORMAL HIGH (ref 7.25–7.43)
pH, Ven: 7.466 — ABNORMAL HIGH (ref 7.25–7.43)
pO2, Ven: 28 mmHg — CL (ref 32–45)
pO2, Ven: 29 mmHg — CL (ref 32–45)

## 2024-09-28 SURGERY — RIGHT HEART CATH
Anesthesia: LOCAL

## 2024-09-28 MED ORDER — HEPARIN (PORCINE) IN NACL 1000-0.9 UT/500ML-% IV SOLN
INTRAVENOUS | Status: DC | PRN
  Administered 2024-09-28: 500 mL

## 2024-09-28 MED ORDER — LIDOCAINE HCL (PF) 1 % IJ SOLN
INTRAMUSCULAR | Status: DC | PRN
Start: 2024-09-28 — End: 2024-09-28
  Administered 2024-09-28: 5 mL via INTRADERMAL

## 2024-09-28 MED ORDER — FUROSEMIDE 40 MG PO TABS
40.0000 mg | ORAL_TABLET | Freq: Every day | ORAL | 0 refills | Status: DC
  Filled 2024-09-28: qty 30, 30d supply, fill #0

## 2024-09-28 MED ORDER — AMIODARONE HCL 200 MG PO TABS
200.0000 mg | ORAL_TABLET | Freq: Two times a day (BID) | ORAL | 0 refills | Status: DC
  Filled 2024-09-28: qty 60, 30d supply, fill #0

## 2024-09-28 MED ORDER — SODIUM CHLORIDE 0.9% FLUSH: 3.0000 mL | INTRAVENOUS | Status: DC | PRN

## 2024-09-28 MED ORDER — SODIUM CHLORIDE 0.9% FLUSH
3.0000 mL | Freq: Two times a day (BID) | INTRAVENOUS | Status: DC
  Administered 2024-09-28: 3 mL via INTRAVENOUS

## 2024-09-28 MED ORDER — LIDOCAINE HCL (PF) 1 % IJ SOLN
INTRAMUSCULAR | Status: AC
  Filled 2024-09-28: qty 30

## 2024-09-28 MED ORDER — POTASSIUM CHLORIDE CRYS ER 20 MEQ PO TBCR
20.0000 meq | EXTENDED_RELEASE_TABLET | Freq: Every day | ORAL | 0 refills | Status: DC
  Filled 2024-09-28: qty 30, 30d supply, fill #0

## 2024-09-28 MED ORDER — SODIUM CHLORIDE 0.9 % IV SOLN: 250.0000 mL | INTRAVENOUS | Status: DC | PRN

## 2024-09-28 MED ORDER — HYDRALAZINE HCL 20 MG/ML IJ SOLN: 10.0000 mg | INTRAMUSCULAR | Status: DC | PRN

## 2024-09-28 SURGICAL SUPPLY — 7 items
CATH SWAN GANZ 7F STRAIGHT (CATHETERS) IMPLANT
GLIDESHEATH SLENDER 7FR .021G (SHEATH) IMPLANT
PACK CARDIAC CATHETERIZATION (CUSTOM PROCEDURE TRAY) ×2 IMPLANT
SHEATH PROBE COVER 6X72 (BAG) IMPLANT
TRANSDUCER W/STOPCOCK (MISCELLANEOUS) IMPLANT
TUBING ART PRESS 72 MALE/FEM (TUBING) IMPLANT
WIRE MICRO SET SILHO 5FR 7 (SHEATH) IMPLANT

## 2024-09-28 NOTE — TOC CM/SW Note (Signed)
 Transition of Care [facility]) - Inpatient Brief Assessment   Patient Details  Name: [name] MRN: [id number] Date of Birth: 1929/12/07  Transition of Care [facility] CM/SW Contact:    Waddell Barnie Rama, RN Phone Number: 09/28/2024, 12:59 PM   Clinical Narrative: From home alone, has PCP and insurance on file, states has no HH services in place at this time ,has walker and a cane at home.  States family member field seismologist)  will transport them home at [employer] and family is support system, states gets medications from [facility].  Pta self ambulatory.   There are no ICM needs identified  at this time.  Please place consult for ICM needs.     Transition of Care Asessment: Insurance and Status: Insurance coverage has been reviewed Patient has primary care physician: Yes Home environment has been reviewed: home alone Prior level of function:: indep Prior/Current Home Services: Current home services (walker /cane) Social Drivers of Health Review: SDOH reviewed no interventions necessary Readmission risk has been reviewed: Yes Transition of care needs: no transition of care needs at this time

## 2024-09-28 NOTE — Discharge Summary (Signed)
 Physician Discharge Summary  [name] FMW:[id number] DOB: January 27, 1930 DOA: 09/26/2024  PCP: Katheen Gregory Rock[place], NP  Admit date: 09/26/2024 Discharge date: 09/28/2024  Time spent: 45 minutes  Recommendations for Outpatient Follow-up:  TAVR next week 12/2, he will be admitted on Monday 12/1  Discharge Diagnoses:  Acute on chronic systolic CHF Severe Aortic stenosis Mitral regurgitation  CAD (coronary artery disease)   Elevated troponin   QT prolongation   Hyponatremia   CHF (congestive heart failure) (HCC)   Discharge Condition: improved  Diet recommendation: heart healthy  Filed Weights   09/27/24 1621 09/28/24 0520  Weight: 66.5 kg 65.3 kg    History of present illness:  89/M with systolic CHF, recently hospitalized and diagnosed with multivessel CAD and severe aortic stenosis, improved with diuresis, seen by structural heart team and discharged home for outpatient PCI followed by TAVR. - Underwent left main-LAD arthrectomy and PCI/stenting on 09/15/2024, discharged home thereafter and was scheduled to see Dr. Wonda in clinic today 11/24. - Presented to the ED with progressive dyspnea on exertion for few days, BNP 1363, troponin 58, creatinine 1.5, sodium 129, chest x-ray with pulmonary edema and pleural effusions  Hospital Course:   Acute on chronic systolic/combined CHF Severe aortic stenosis Moderate to severe mitral regurgitation -Last echo 11/25 noted EF 40-45%, wall motion abnormality, normal RV, moderate to severe mitral regurgitation and severe aortic stenosis -Readmitted with volume overload, improved with diuresis, appears euvolemic now,  - Cautious with GDMT considering severe aortic stenosis - RHC this morning just completed, showed normal filling pressures, Cards following recommended to DC home on lasix  40mg  daily w/ KCL  PVCs, noted during RHC today with CO drop correspondingly -started on amio 200mg  BID    Multivessel CAD -Recent  arthrectomy of ostial LAD and distal left main with drug-eluting stent 11/12 has 85% mid to distal RCA disease - No ACS at this time, continue aspirin  Plavix  and statin -plan for staged intervention of RCA post TAVR if he has angina   CKD 3a Stable   Hypothyroidism Continue Synthroid    Discharge Exam: Vitals:   09/28/24 0816 09/28/24 0821  BP: (!) 119/56 (!) 136/55  Pulse: 71 69  Resp: (!) 25 20  Temp:    SpO2: 93% 92%   Gen: Awake, Alert, Oriented X 3,  HEENT: no JVD Lungs: Good air movement bilaterally, CTAB CVS: S1S2/RRR systolic murmur Abd: soft, Non tender, non distended, BS present Extremities: No edema Skin: no new rashes on exposed skin   Discharge Instructions   Discharge Instructions     Diet - low sodium heart healthy   Complete by: As directed    Increase activity slowly   Complete by: As directed    No wound care   Complete by: As directed       Allergies as of 09/28/2024   No Known Allergies      Medication List     TAKE these medications    acetaminophen  500 MG tablet Commonly known as: TYLENOL  Take 1,000 mg by mouth every 6 (six) hours as needed for mild pain (pain score 1-3) or headache.   amiodarone  200 MG tablet Commonly known as: PACERONE  Take 1 tablet (200 mg total) by mouth 2 (two) times daily.   aspirin  EC 81 MG tablet Take 1 tablet (81 mg total) by mouth daily. Swallow whole.   atorvastatin  40 MG tablet Commonly known as: LIPITOR Take 1 tablet (40 mg total) by mouth daily.   Centrum Silver Men 50+  Tabs Take 1 tablet by mouth in the morning.   clopidogrel  75 MG tablet Commonly known as: PLAVIX  Take 1 tablet (75 mg total) by mouth daily.   fish oil-omega-3 fatty acids  1000 MG capsule Take 1 g by mouth daily.   furosemide  40 MG tablet Commonly known as: LASIX  Take 1 tablet (40 mg total) by mouth daily. What changed:  medication strength how much to take   levothyroxine  88 MCG tablet Commonly known as:  SYNTHROID  Take 1 tablet (88 mcg total) by mouth daily before breakfast.   potassium chloride  SA 20 MEQ tablet Commonly known as: KLOR-CON  M Take 1 tablet (20 mEq total) by mouth daily.       No Known Allergies  Follow-up Information     Nche, Gregory Rockford, NP. Call on 10/05/2024.   Specialty: Internal Medicine Why: 11 am for hospital follow up Contact information: [address] [place] [address] [phone]                  The results of significant diagnostics from this hospitalization (including imaging, microbiology, ancillary and laboratory) are listed below for reference.    Significant Diagnostic Studies: CARDIAC CATHETERIZATION Result Date: 09/28/2024 Findings: RA = 4 RV = 26/6 PA = 26/17 (22) PCW = 12 (no significant v waves) Fick cardiac output/index = 3.8/2.1 Thermo CO/CI = 2.8/1.5 (Index 1.3 while in bigeminy and 1.7 in sinus) PVR = 2.7 (Fick) 3.6 (TD) Ao sat = 92% PA sat = 57%, 58% PAPi = 2.25 Assessment: 1. Normal filling pressures with low cardiac output in setting of significant AS (Output drops while in bigeminy) Plan/Discussion: Patient pending TAVR. Next Tuesday. D/w Dr. Wonda & Dr. Anner. Can consider increasing outpatient diuretics and admitting on Monday through cath lab for Swan and pre-TAVR optimization with DBA as an option. Toribio Fuel, MD 8:51 AM  DG Chest 2 View Result Date: 09/26/2024 CLINICAL DATA:  sob EXAM: CHEST - 2 VIEW COMPARISON:  September 06, 2024 FINDINGS: The cardiomediastinal silhouette is unchanged in contour.Atherosclerotic calcifications of the aorta. Moderate LEFT and small RIGHT pleural effusion. No pneumothorax. Diffuse interstitial prominence. Visualized abdomen is unremarkable. Mild multilevel degenerative changes of the thoracic spine. IMPRESSION: Constellation of findings are favored to reflect pulmonary edema with moderate LEFT and small RIGHT pleural effusions. Electronically Signed   By: Corean Salter M.D.   On: 09/26/2024 14:43   CT ANGIO CHEST AORTA W/CM & OR WO/CM Result Date: 09/24/2024 CLINICAL DATA:  Aortic valve replacement, preoperative evaluation EXAM: CT ANGIOGRAPHY CHEST WITH CONTRAST TECHNIQUE: Multidetector CT imaging of the chest was performed using the standard protocol during bolus administration of intravenous contrast. Multiplanar CT image reconstructions and MIPs were obtained to evaluate the vascular anatomy. Multiplanar image (3D post-processing) reconstructions and MIPs were obtained to evaluate the vascular anatomy. RADIATION DOSE REDUCTION: This exam was performed according to the departmental dose-optimization program which includes automated exposure control, adjustment of the mA and/or kV according to patient size and/or use of iterative reconstruction technique. CONTRAST:  OMNIPAQUE  IOHEXOL  350 MG/ML SOLN COMPARISON:  None Available. FINDINGS: Cardiovascular: Enlarged heart. No pericardial effusion. Calcific atherosclerosis is present in the left and right coronary territories. Partially calcified aortic valve. The tubular ascending thoracic aorta is estimated at 3.5 cm. The main pulmonary artery is at the upper limits of normal for diameter. Mediastinum/Nodes: No enlarged mediastinal, hilar, or axillary lymph nodes. Thyroid  gland, trachea, and esophagus demonstrate no significant findings. Lungs/Pleura: Moderate bilateral pleural effusions. Upper Abdomen: Reported  separately. Musculoskeletal: Degenerative changes in the imaged osseous structures. Review of the MIP images confirms the above findings. IMPRESSION: 1. Native coronary artery atherosclerotic vascular disease. 2. No thoracic aortic aneurysm or dissection. 3. Moderate bilateral pleural effusions with associated consolidation. Electronically Signed   By: Maude Naegeli M.D.   On: 09/24/2024 16:52   CT ANGIO ABDOMEN PELVIS  W & WO CONTRAST Result Date: 09/24/2024 CLINICAL DATA:  Aortic valve replacement,  preoperative evaluation EXAM: CTA ABDOMEN AND PELVIS WITHOUT AND WITH CONTRAST TECHNIQUE: Multidetector CT imaging of the abdomen and pelvis was performed using the standard protocol during bolus administration of intravenous contrast. Multiplanar reconstructed images and MIPs were obtained and reviewed to evaluate the vascular anatomy. RADIATION DOSE REDUCTION: This exam was performed according to the departmental dose-optimization program which includes automated exposure control, adjustment of the mA and/or kV according to patient size and/or use of iterative reconstruction technique. CONTRAST:  OMNIPAQUE  IOHEXOL  350 MG/ML SOLN COMPARISON:  None Available. FINDINGS: VASCULAR Aorta: Mild diffuse atherosclerotic changes are present in the abdominal aorta which are both calcified and fibrofatty. There is a focal narrowing in the juxtarenal segment with minimal diameter estimated at 9 mm. Minimal diameter of the infrarenal segment is estimated 10 mm. 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 SMA: Mild atherosclerotic changes in the proximal segment, patent. Renals: A moderate stenosis is present in the proximal right main renal artery secondary to atherosclerotic plaque. The left renal artery is patent. IMA: Patent. Inflow: The right common iliac artery is patent with mild atherosclerotic changes in minimal luminal diameter estimated at 6 mm. The right internal iliac artery is patent. The right external iliac artery is patent with minimal luminal diameter estimated at 6 mm. Left common iliac artery is patent with minimal luminal diameter estimated at 6 mm. Left internal iliac artery is patent. The left external iliac artery is patent with minimal luminal diameter estimated at 6 mm. Proximal Outflow: The right common femoral artery is patent with minimal luminal diameter estimated at 7 mm. The left common femoral artery is patent with minimal luminal diameter  estimated at 6 mm. Veins: No obvious venous abnormality within the limitations of this arterial phase study. Review of the MIP images confirms the above findings. NON-VASCULAR Lower chest: Reported separately. Hepatobiliary: No focal liver abnormality is seen. No gallstones, gallbladder wall thickening, or biliary dilatation. Mildly nodular liver surface contour. Pancreas: Unremarkable. No pancreatic ductal dilatation or surrounding inflammatory changes. Spleen: Normal in size without focal abnormality. Adrenals/Urinary Tract: Adrenal glands are unremarkable. Kidneys are normal, without renal calculi, focal lesion, or hydronephrosis. Bladder is unremarkable. Stomach/Bowel: No dilated loops of bowel are seen. Scattered colonic diverticulosis. Lymphatic: No evidence of significant lymphadenopathy. Reproductive: Prostate size is within normal limits. Other: Bilateral fat containing inguinal hernias. Musculoskeletal: Mild degenerative changes are present in the lumbar spine. IMPRESSION: 1. Minimal luminal diameter is detailed above. 2. 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 3. Moderate stenosis in the proximal right main renal artery. Electronically Signed   By: Maude Naegeli M.D.   On: 09/24/2024 16:49   CT CORONARY MORPH W/CTA COR W/SCORE W/CA W/CM &/OR WO/CM Result Date: 09/23/2024 CLINICAL DATA:  Severe Aortic Stenosis. EXAM: Cardiac TAVR CT TECHNIQUE: A non-contrast, gated CT scan was obtained with axial slices of 2.5 mm through the heart for aortic valve scoring. A 80 kV retrospective, gated, contrast cardiac scan was obtained. Gantry rotation speed was 230 msec and collimation was  0.63 mm. Nitroglycerin  was not given. The 3D dataset was reconstructed in systole with motion correction. The 3D data set was reconstructed in 5% intervals of the 0-95% of the R-R cycle. Systolic and diastolic phases were analyzed on a dedicated workstation using MPR, MIP, and VRT modes. The patient received 100 cc of  contrast. FINDINGS: Image quality: Excellent. Noise artifact is: Limited. Valve Morphology: Tricuspid aortic valve with severely calcified leaflets and severely restricted leaflet motion in systole. Aortic Valve Calcium  score: 3155 Aortic annular dimension: Phase assessed: 25% Annular area: 429 mm2 Annular perimeter: 75.0 mm Max diameter: 26.8 mm Min diameter: 20.8 mm Annular and subannular calcification: Single, moderate calcification under the LCC extending into the LVOT. Membranous septum length: 7.5 mm Optimal coplanar projection: RAO 3 CAU 25 Coronary Artery Height above Annulus: Left Main: 14.9 mm Right Coronary: 23.3 mm Sinus of Valsalva Measurements: Non-coronary: 31 mm Right-coronary: 31 mm Left-coronary: 31 mm Sinus of Valsalva Height: Non-coronary: 27.3 mm Right-coronary: 29.2 mm Left-coronary: 23.4 mm Sinotubular Junction: 30 mm Ascending Thoracic Aorta: 35 mm Coronary Arteries: Normal coronary origin. Right dominance. The study was performed without use of NTG and is insufficient for plaque evaluation. Please refer to recent cardiac catheterization for coronary assessment. Severe coronary calcifications. Cardiac Morphology: Right Atrium: Right atrial size is within normal limits. Contrast reflux into the IVC consistent with elevated RA pressure. Right Ventricle: The right ventricular cavity is within normal limits. Left Atrium: Left atrial size is normal in size with no left atrial appendage filling defect. Small PFO. Left Ventricle: The ventricular cavity size is within normal limits. Pulmonary arteries: Dilated pulmonary artery suggestive of pulmonary hypertension. Pulmonary veins: Normal pulmonary venous drainage. Pericardium: Normal thickness with no significant effusion or calcium  present. Mitral Valve: The mitral valve is normal structure without significant calcification. Extra-cardiac findings: Moderate bilateral pleural effusions. See attached radiology report for non-cardiac structures.  IMPRESSION: 1. Annular measurements support a 23 mm S3 or 26 mm Evolut Pro. 2. Single, moderate calcification under the LCC extending into the LVOT. 3. Sufficient coronary to annulus distance. 4. Optimal Fluoroscopic Angle for Delivery: RAO 3 CAU 25 5. Moderate bilateral pleural effusions. 6. Dilated pulmonary artery suggestive of pulmonary hypertension. Darryle T. Barbaraann, MD Electronically Signed   By: Darryle Barbaraann M.D.   On: 09/23/2024 13:32   CARDIAC CATHETERIZATION Result Date: 09/15/2024   Dist LM lesion is 30% stenosed.   Mid RCA to Dist RCA lesion is 85% stenosed.   Ramus lesion is 100% stenosed.   1st Diag lesion is 30% stenosed.   Ost Cx to Prox Cx lesion is 50% stenosed.   Ost LAD lesion is 85% stenosed.   A drug-eluting stent was successfully placed using a STENT SYNERGY XD [id number].   Post intervention, there is a 0% residual stenosis.   Post intervention, there is a 0% residual stenosis.   In the absence of any other complications or medical issues, we expect the patient to be ready for discharge from an interventional cardiology perspective on 09/16/2024. 1.  Successful high risk OCT guided orbital atherectomy of the ostial LAD and distal left main with drug-eluting stent placement extending all the way back to the ostium of the left main.  The ostium of the left circumflex appeared disease initially but no critical stenosis was noted after atherectomy.  Thus, I did not have to treat the left circumflex.  This was done with intra-aortic balloon pump support.  The intra-aortic balloon pump was removed at the end of the  case and the site was already preclosed with 1 Pro-glide device. Recommendations: Monitor overnight given high risk procedure.  Consider staged RCA PCI or proceeding directly with TAVR.   CARDIAC CATHETERIZATION Result Date: 09/09/2024 Conclusions: Severe multivessel coronary artery disease, as detailed below, including 80-90% ostial LAD stenosis, chronic total occlusion of the  ramus intermedius, and 80-90% in-stent restenosis at the distal margin of proximal/mid RCA stent. Normal left and right heart filling pressures. Mild pulmonary hypertension. Mildly-moderately reduced Fick cardiac output/index. Recommendations: Continue workup of severe aortic stenosis, mitral regurgitation, and cardiomyopathy per structural heart team. Aggressive secondary prevention of coronary artery disease. Maintain net even fluid balance with escalation of goal-directed medical therapy as tolerated. Lonni Hanson, MD [facility]  ECHOCARDIOGRAM COMPLETE Result Date: 09/07/2024    ECHOCARDIOGRAM REPORT   Patient Name:   [name] [facility] Date of Exam: 09/07/2024 Medical Rec #:  [id number]          Height:       69.0 in Accession #:    [id number]         Weight:       159.0 lb Date of Birth:  23-Sep-1930         BSA:          1.874 m Patient Age:    89 years           BP:           120/84 mmHg Patient Gender: M                  HR:           75 bpm. Exam Location:  Inpatient Procedure: 2D Echo (Both Spectral and Color Flow Doppler were utilized during            procedure). Indications:    CHF  History:        Patient has prior history of Echocardiogram examinations. CHF;                 Aortic Valve Disease and Mitral Valve Disease.  Sonographer:    Norleen Amour Referring Phys: [id number] CAROLE N HALL IMPRESSIONS  1. Left ventricular ejection fraction, by estimation, is 40 to 45%. Left ventricular ejection fraction by 2D MOD biplane is 46.1 %. The left ventricle has mildly decreased function. The left ventricle demonstrates regional wall motion abnormalities (see  scoring diagram/findings for description). Left ventricular diastolic parameters are indeterminate.  2. Right ventricular systolic function is normal. The right ventricular size is normal. There is normal pulmonary artery systolic pressure.  3. Left atrial size was severely dilated.  4. The mitral valve is degenerative. Moderate to severe mitral  valve regurgitation.  5. The aortic valve is calcified. Aortic valve regurgitation is mild. Severe aortic valve stenosis. Aortic valve area, by VTI measures 0.86 cm. Aortic valve mean gradient measures 35.0 mmHg. Aortic valve Vmax measures 3.98 m/s. Aortic valve acceleration time measures 127 msec.  6. The inferior vena cava is normal in size with greater than 50% respiratory variability, suggesting right atrial pressure of 3 mmHg. Comparison(s): Mitral regurgitation has increase. Aortic stenosis is still severe. Decrease in LVEF. FINDINGS  Left Ventricle: Left ventricular ejection fraction, by estimation, is 40 to 45%. Left ventricular ejection fraction by 2D MOD biplane is 46.1 %. The left ventricle has mildly decreased function. The left ventricle demonstrates regional wall motion abnormalities. The left ventricular internal cavity size was normal in size. There is no left ventricular hypertrophy. Left ventricular  diastolic parameters are indeterminate.  LV Wall Scoring: The mid and distal lateral wall, posterior wall, apical septal segment, apical anterior segment, and apical inferior segment are hypokinetic. Right Ventricle: The right ventricular size is normal. No increase in right ventricular wall thickness. Right ventricular systolic function is normal. There is normal pulmonary artery systolic pressure. The tricuspid regurgitant velocity is 2.35 m/s, and  with an assumed right atrial pressure of 3 mmHg, the estimated right ventricular systolic pressure is 25.1 mmHg. Left Atrium: Left atrial size was severely dilated. Right Atrium: Right atrial size was normal in size. Pericardium: There is no evidence of pericardial effusion. Mitral Valve: The mitral valve is degenerative in appearance. Moderate to severe mitral valve regurgitation. MV peak gradient, 7.4 mmHg. The mean mitral valve gradient is 3.0 mmHg. Tricuspid Valve: The tricuspid valve is normal in structure. Tricuspid valve regurgitation is trivial.  No evidence of tricuspid stenosis. Aortic Valve: The aortic valve is calcified. Aortic valve regurgitation is mild. Aortic regurgitation PHT measures 498 msec. Severe aortic stenosis is present. Aortic valve mean gradient measures 35.0 mmHg. Aortic valve peak gradient measures 63.4 mmHg. Aortic valve area, by VTI measures 0.86 cm. Pulmonic Valve: The pulmonic valve was normal in structure. Pulmonic valve regurgitation is not visualized. No evidence of pulmonic stenosis. Aorta: The aortic root and ascending aorta are structurally normal, with no evidence of dilitation. Venous: The inferior vena cava is normal in size with greater than 50% respiratory variability, suggesting right atrial pressure of 3 mmHg. IAS/Shunts: No atrial level shunt detected by color flow Doppler.  LEFT VENTRICLE PLAX 2D                        Biplane EF (MOD) LVIDd:         5.80 cm         LV Biplane EF:   Left LVIDs:         4.60 cm                          ventricular LV PW:         0.80 cm                          ejection LV IVS:        0.90 cm                          fraction by LVOT diam:     2.00 cm                          2D MOD LV SV:         72                               biplane is LV SV Index:   39                               46.1 %. LVOT Area:     3.14 cm                                Diastology  LV e' medial:    5.61 cm/s LV Volumes (MOD)               LV E/e' medial:  27.5 LV vol d, MOD    170.0 ml      LV e' lateral:   5.70 cm/s A2C:                           LV E/e' lateral: 27.0 LV vol d, MOD    168.0 ml A4C: LV vol s, MOD    90.9 ml A2C: LV vol s, MOD    79.1 ml A4C: LV SV MOD A2C:   79.1 ml LV SV MOD A4C:   168.0 ml LV SV MOD BP:    78.0 ml RIGHT VENTRICLE             IVC RV Basal diam:  3.10 cm     IVC diam: 1.80 cm RV S prime:     11.40 cm/s TAPSE (M-mode): 2.1 cm      PULMONARY VEINS                             Diastolic Velocity: 87.10 cm/s                             S/D  Velocity:       0.30                             Systolic Velocity:  22.70 cm/s LEFT ATRIUM              Index        RIGHT ATRIUM           Index LA diam:        3.60 cm  1.92 cm/m   RA Area:     14.00 cm LA Vol (A2C):   106.0 ml 56.56 ml/m  RA Volume:   34.60 ml  18.46 ml/m LA Vol (A4C):   76.4 ml  40.77 ml/m LA Biplane Vol: 95.7 ml  51.07 ml/m  AORTIC VALVE                     PULMONIC VALVE AV Area (Vmax):    0.83 cm      PV Vmax:       0.83 m/s AV Area (Vmean):   0.83 cm      PV Peak grad:  2.7 mmHg AV Area (VTI):     0.86 cm AV Vmax:           398.00 cm/s AV Vmean:          266.600 cm/s AV VTI:            0.844 m AV Peak Grad:      63.4 mmHg AV Mean Grad:      35.0 mmHg LVOT Vmax:         105.00 cm/s LVOT Vmean:        70.467 cm/s LVOT VTI:          0.231 m LVOT/AV VTI ratio: 0.27 AI PHT:            498 msec  AORTA Ao Root diam: 3.00 cm Ao Asc diam:  2.70 cm MITRAL VALVE  TRICUSPID VALVE MV Area (PHT): 3.28 cm       TR Peak grad:   22.1 mmHg MV Area VTI:   1.99 cm       TR Vmax:        235.00 cm/s MV Peak grad:  7.4 mmHg MV Mean grad:  3.0 mmHg       SHUNTS MV Vmax:       1.36 m/s       Systemic VTI:  0.23 m MV Vmean:      74.7 cm/s      Systemic Diam: 2.00 cm MV Decel Time: 231 msec MR Peak grad:    92.2 mmHg MR Mean grad:    57.0 mmHg MR Vmax:         480.00 cm/s MR Vmean:        358.0 cm/s MR PISA:         1.57 cm MR PISA Eff ROA: 10 mm MR PISA Radius:  0.50 cm MV E velocity: 154.00 cm/s MV A velocity: 49.40 cm/s MV E/A ratio:  3.12 Stanly Leavens MD Electronically signed by Stanly Leavens MD Signature Date/Time: 09/07/2024/2:30:33 PM    Final    DG Chest 2 View Result Date: 09/06/2024 EXAM: 2 VIEW(S) XRAY OF THE CHEST 09/06/2024 03:51:25 PM COMPARISON: 06/25/2024 CLINICAL HISTORY: Shortness of breath with exertion. FINDINGS: LUNGS AND PLEURA: Moderate left and small right pleural effusions with bibasilar atelectasis, new from previous exam. Increased interstitial  markings compatible with mild edema. No pneumothorax. HEART AND MEDIASTINUM: Aortic atherosclerosis. No acute abnormality of the cardiac and mediastinal silhouettes. BONES AND SOFT TISSUES: Polyarticular degenerative change. No acute osseous abnormality. IMPRESSION: 1. Moderate left and small right pleural effusions with bibasilar atelectasis, new from prior exam. 2. Mild interstitial pulmonary edema. Electronically signed by: Waddell Calk MD 09/06/2024 04:32 PM EST RP Workstation: [id number]    Microbiology: Recent Results (from the past 240 hours)  Resp panel by RT-PCR (RSV, Flu A&B, Covid) Anterior Nasal Swab     Status: None   Collection Time: 09/26/24  1:30 PM   Specimen: Anterior Nasal Swab  Result Value Ref Range Status   SARS Coronavirus 2 by RT PCR NEGATIVE NEGATIVE Final   Influenza A by PCR NEGATIVE NEGATIVE Final   Influenza B by PCR NEGATIVE NEGATIVE Final    Comment: (NOTE) The Xpert Xpress SARS-CoV-2/FLU/RSV plus assay is intended as an aid in the diagnosis of influenza from Nasopharyngeal swab specimens and should not be used as a sole basis for treatment. Nasal washings and aspirates are unacceptable for Xpert Xpress SARS-CoV-2/FLU/RSV testing.  Fact Sheet for Patients: bloggercourse.com  Fact Sheet for Healthcare Providers: seriousbroker.it  This test is not yet approved or cleared by the [place]  FDA and has been authorized for detection and/or diagnosis of SARS-CoV-2 by FDA under an Emergency Use Authorization (EUA). This EUA will remain in effect (meaning this test can be used) for the duration of the COVID-19 declaration under Section 564(b)(1) of the Act, 21 U.S.C. section 360bbb-3(b)(1), unless the authorization is terminated or revoked.     Resp Syncytial Virus by PCR NEGATIVE NEGATIVE Final    Comment: (NOTE) Fact Sheet for Patients: bloggercourse.com  Fact Sheet for  Healthcare Providers: seriousbroker.it  This test is not yet approved or cleared by the [place]  FDA and has been authorized for detection and/or diagnosis of SARS-CoV-2 by FDA under an Emergency Use Authorization (EUA). This EUA will remain in effect (meaning this test can be used) for the  duration of the COVID-19 declaration under Section 564(b)(1) of the Act, 21 U.S.C. section 360bbb-3(b)(1), unless the authorization is terminated or revoked.  Performed at [facility], [address] [place], [place] [address]      Labs: Basic Metabolic Panel: Recent Labs  Lab 09/26/24 1348 09/27/24 0518  NA 129* 129*  K 4.6 3.9  CL 95* 94*  CO2 20* 23  GLUCOSE 92 108*  BUN 20 19  CREATININE 1.52* 1.52*  CALCIUM  9.2 8.9  MG  --  2.0   Liver Function Tests: Recent Labs  Lab 09/27/24 0518  AST 23  ALT 18  ALKPHOS 74  BILITOT 0.9  PROT 6.4*  ALBUMIN 3.5   No results for input(s): LIPASE, AMYLASE in the last 168 hours. No results for input(s): AMMONIA in the last 168 hours. CBC: Recent Labs  Lab 09/26/24 1348  WBC 9.5  NEUTROABS 7.1  HGB 13.4  HCT 39.6  MCV 93.0  PLT 316   Cardiac Enzymes: No results for input(s): CKTOTAL, CKMB, CKMBINDEX, TROPONINI in the last 168 hours. BNP: BNP (last 3 results) Recent Labs    09/06/24 2019 09/26/24 1348  BNP 1,123.1* 1,363.0*    ProBNP (last 3 results) Recent Labs    09/06/24 1530  PROBNP 6,771*    CBG: No results for input(s): GLUCAP in the last 168 hours.     Signed:  Sigurd Pac MD.  [facility] 09/28/2024, 1:36 PM

## 2024-09-28 NOTE — TOC Transition Note (Signed)
 Transition of Care [facility]) - Discharge Note   Patient Details  Name: [name] MRN: [id number] Date of Birth: 01-18-1930  [facility]) CM/SW Contact:  Waddell Barnie Rama, RN Phone Number: 09/28/2024, 1:01 PM   Clinical Narrative:    For dc home, Pastor at bedside to transport home.  He has no needs.         Patient Goals and CMS Choice            Discharge Placement                       Discharge Plan and Services Additional resources added to the After Visit Summary for                                       Social Drivers of Health (SDOH) Interventions SDOH Screenings   Food Insecurity: No Food Insecurity (09/26/2024)  Housing: Low Risk  (09/26/2024)  Transportation Needs: No Transportation Needs (09/26/2024)  Utilities: Not At Risk (09/26/2024)  Alcohol Screen: Low Risk  (12/01/2023)  Depression (PHQ2-9): Low Risk  (09/20/2024)  Financial Resource Strain: Low Risk  (12/01/2023)  Physical Activity: Sufficiently Active (12/01/2023)  Social Connections: Moderately Integrated (09/26/2024)  Stress: No Stress Concern Present (12/01/2023)  Tobacco Use: Medium Risk (09/27/2024)  Health Literacy: Adequate Health Literacy (12/01/2023)     Readmission Risk Interventions    09/28/2024   12:58 PM 09/08/2024   10:09 AM  Readmission Risk Prevention Plan  Transportation Screening Complete Complete  PCP or Specialist Appt within 5-7 Days Complete Complete  Home Care Screening Complete Complete  Medication Review (RN CM) Complete Complete

## 2024-09-28 NOTE — Progress Notes (Addendum)
 HEART AND VASCULAR CENTER   MULTIDISCIPLINARY HEART VALVE TEAM  Patient Name: [name] Date of Encounter: 09/28/2024  Admit date: 09/26/2024   PCP:  [name], NP  [facility] Cardiologist:  Lonni Cash, MD  [facility] Structural heart: None [facility] Electrophysiologist:  None   Hospital Problem List     Principal Problem:   CHF exacerbation (HCC) Active Problems:   CAD (coronary artery disease)   Elevated troponin   QT prolongation   Hyponatremia   CHF (congestive heart failure) (HCC)     Subjective   Feeling better in terms of breathing.  Biggest complaint was orthopnea which has now resolved after IV diuresis. Wants to go home with family. Daughter, [name], is a engineer, civil (consulting). Spoke to daughter over speaker.   Inpatient Medications    Scheduled Meds:  aspirin  EC  81 mg Oral Daily   atorvastatin   40 mg Oral Daily   clopidogrel   75 mg Oral Daily   heparin   5,000 Units Subcutaneous Q8H   levothyroxine   88 mcg Oral Q0600   sodium chloride  flush  3 mL Intravenous Q12H   Continuous Infusions:  sodium chloride      PRN Meds: sodium chloride , acetaminophen  **OR** acetaminophen , hydrALAZINE , sodium chloride  flush   Vital Signs    Vitals:   09/28/24 0806 09/28/24 0811 09/28/24 0816 09/28/24 0821  BP: (!) 123/45 (!) 113/49 (!) 119/56 (!) 136/55  Pulse: (!) 59 64 71 69  Resp: (!) 21 18 (!) 25 20  Temp:      TempSrc:      SpO2: 93% 93% 93% 92%  Weight:      Height:        Intake/Output Summary (Last 24 hours) at 09/28/2024 1254 Last data filed at 09/28/2024 1226 Gross per 24 hour  Intake 240 ml  Output 1950 ml  Net -1710 ml   Filed Weights   09/27/24 1621 09/28/24 0520  Weight: 66.5 kg 65.3 kg    Physical Exam    GEN: appears younger than stated age.  HEENT: Grossly normal.  Neck: Supple, no JVD or masses. Cardiac: RRR, 3/6 SEM heard best at the RUSB, 2/6 holosystolic murmur at RUSB. No rubs, or gallops. No  clubbing, cyanosis, edema.   Respiratory:  Respirations regular and unlabored, clear to auscultation bilaterally. GI: Soft, nontender, nondistended, BS + x 4. MS: no deformity or atrophy. Skin: warm and dry, no rash. Neuro:  Strength and sensation are intact. Psych: AAOx3.  Normal affect.  Labs    CBC Recent Labs    09/26/24 1348  WBC 9.5  NEUTROABS 7.1  HGB 13.4  HCT 39.6  MCV 93.0  PLT 316   Basic Metabolic Panel: Recent Labs  Lab 09/26/24 1348 09/27/24 0518  NA 129* 129*  K 4.6 3.9  CL 95* 94*  CO2 20* 23  GLUCOSE 92 108*  BUN 20 19  CREATININE 1.52* 1.52*  CALCIUM  9.2 8.9  MG  --  2.0   GFR: Estimated Creatinine Clearance (by C-G formula based on SCr of 1.52 mg/dL (H)) Male: 75.0 mL/min (A) Male: 29.2 mL/min (A) Recent Labs  Lab 09/26/24 1348  WBC 9.5   Liver Function Tests Recent Labs    09/27/24 0518  AST 23  ALT 18  ALKPHOS 74  BILITOT 0.9  PROT 6.4*  ALBUMIN 3.5   [place] Cardiomyopathy Questionnaire     09/28/2024   12:21 PM  KCCQ-12  1 a. Ability to shower/bathe Moderately limited  1  b. Ability to walk 1 block Extremely limited  1 c. Ability to hurry/jog Other, Did not do  2. Edema feet/ankles/legs Never over the past 2 weeks  3. Limited by fatigue All of the time  4. Limited by dyspnea All of the time  5. Sitting up / on 3+ pillows Every night  6. Limited enjoyment of life Extremely limited  7. Rest of life w/ symptoms Not at all satisfied  8 a. Participation in hobbies Severely limited  8 b. Participation in chores Severely limited  8 c. Visiting family/friends Severely limited     ______________________   Pre Surgical Assessment: 5 M Walk Test  26M=16.55ft  5 Meter Walk Test- trial 1: 5.6 seconds 5 Meter Walk Test- trial 2: 5.5 seconds 5 Meter Walk Test- trial 3: 5.5 seconds 5 Meter Walk Test Average: 5.5 seconds    Telemetry    Sinus with PVCs and bigemeny  - Personally Reviewed  ECG    NSR, RBBB with HR  66  - Personally Reviewed  Patient Profile     [name] is a 89 y.o. adult with CAD s/p previous PCI to RCA with 80-90% ISR at the distal margin of proximal/mid RCA stent, s/p PCI LM/LAD w/ IABP on 09/15/24), DMT2, HFrEF, RBBB, HTN, HLD, hypothyroidism, mod-severe MR and severe AS  Assessment & Plan    Severe LFLG AS: -- STS: 9.33%,  turned down for CAB/AVR by Dr. Daniel. -- Posted for TAVR on 10/05/24 with Dr. Wonda and Dr. Daniel.  -- Plan for discharge home today and bring back on Monday 10/04/24 at 9am for RHC with Dr. Zenaida. Will keep swan in and optimize for TAVR Tuesday 10/05/24.  Acute HFrEF: -- NYHA class III.  -- Appears euvolemic.  -- Net negative 1.7L.  -- RHC today showed normal filling pressures with low cardiac output in setting of significant AS (Output drops while in bigeminy).  -- Discharge on Lasix  40 mg daily with KCL 20meq daily.   PVCs:  -- Output drops with PVCs on RHC. -- Start amio 200mg  BID for PVC suppression.   CAD:  -- s/p previous PCI to RCA with 80-90% ISR at the distal margin of proximal/mid RCA stent, s/p PCI LM/LAD w/ IABP on 09/15/24. -- Plan for staged intervention for RCA after TAVR if he has angina.  -- Continue DAPT with aspirin  81mg  daily and Plavix  75mg  daily. -- Continue Liptor 40mg  daily.   RBBB:  -- Increased risk for HAVB s/p TAVR.   Mod-severe MR: -- Hopefully will improve after TAVR.   Gregory Lamarr Hummer, PA-C  09/28/2024, 12:54 PM  Pager [phone]  I participated in the formation of the plan outlined above.  I discussed the patient's case in detail with Dr. Cherrie and with Izetta Hummer, PA-C.  Agree with recommendations as outlined.  The patient was discharged prior to me personally examining him.  He will return male next Monday for right heart catheterization for optimization with plans for TAVR the following day.  ER precautions given for any progressive heart failure symptoms.  Ozell Wonda,  M.D. 09/28/2024 4:00 PM

## 2024-09-28 NOTE — Progress Notes (Addendum)
 PROGRESS NOTE    [name]  FMW:[id number] DOB: [birth year] DOA: 09/26/2024 PCP: [name], NP  89/M with systolic CHF, recently hospitalized and diagnosed with multivessel CAD and severe aortic stenosis, improved with diuresis, seen by structural heart team and discharged home for outpatient PCI followed by TAVR. - Underwent left main-LAD arthrectomy and PCI/stenting on 09/15/2024, discharged home thereafter and was scheduled to see Dr. Wonda in clinic today 11/24. - Presented to the ED with progressive dyspnea on exertion for few days, BNP 1363, troponin 58, creatinine 1.5, sodium 129, chest x-ray with pulmonary edema and pleural effusions   Subjective: - Feels better, breathing almost back to normal, ambulated yesterday, just back from right heart cath  Assessment and Plan:  Acute on chronic systolic/combined CHF Severe aortic stenosis Moderate to severe mitral regurgitation -Last echo 11/25 noted EF 40-45%, wall motion abnormality, normal RV, moderate to severe mitral regurgitation and severe aortic stenosis -Readmitted with volume overload, improved with diuresis, appears euvolemic now, hold further IV Lasix  pending RHC - Cautious with GDMT considering severe aortic stenosis - RHC this morning just completed, report pending  Multivessel CAD -Recent arthrectomy of ostial LAD and distal left main with drug-eluting stent 11/12 has 85% mid to distal RCA disease - No ACS at this time, continue aspirin  Plavix  and statin  CKD 3a Stable  Hypothyroidism Continue Synthroid     DVT prophylaxis: Heparin  subcutaneous Code Status: Full code Family Communication: None present Disposition Plan: Home pending above workup  Consultants: Cards   Objective: Vitals:   09/28/24 0806 09/28/24 0811 09/28/24 0816 09/28/24 0821  BP: (!) 123/45 (!) 113/49 (!) 119/56 (!) 136/55  Pulse: (!) 59 64 71 69  Resp: (!) 21 18 (!) 25 20  Temp:      TempSrc:      SpO2: 93% 93%  93% 92%  Weight:      Height:        Intake/Output Summary (Last 24 hours) at 09/28/2024 0958 Last data filed at 09/28/2024 0739 Gross per 24 hour  Intake 240 ml  Output 1300 ml  Net -1060 ml   Filed Weights   09/27/24 1621 09/28/24 0520  Weight: 66.5 kg 65.3 kg    Examination:  General exam: Appears calm and comfortable  HEENT: No JVD Respiratory system: Clear to auscultation Cardiovascular system: S1 & S2 heard, RRR.  Systolic murmur Abd: nondistended, soft and nontender.Normal bowel sounds heard. Central nervous system: Alert and oriented. No focal neurological deficits. Extremities: no edema Skin: No rashes Psychiatry:  Mood & affect appropriate.     Data Reviewed:   CBC: Recent Labs  Lab 09/26/24 1348  WBC 9.5  NEUTROABS 7.1  HGB 13.4  HCT 39.6  MCV 93.0  PLT 316   Basic Metabolic Panel: Recent Labs  Lab 09/26/24 1348 09/27/24 0518  NA 129* 129*  K 4.6 3.9  CL 95* 94*  CO2 20* 23  GLUCOSE 92 108*  BUN 20 19  CREATININE 1.52* 1.52*  CALCIUM  9.2 8.9  MG  --  2.0   GFR: Estimated Creatinine Clearance (by C-G formula based on SCr of 1.52 mg/dL (H)) Male: 75.0 mL/min (A) Male: 29.2 mL/min (A) Liver Function Tests: Recent Labs  Lab 09/27/24 0518  AST 23  ALT 18  ALKPHOS 74  BILITOT 0.9  PROT 6.4*  ALBUMIN 3.5   No results for input(s): LIPASE, AMYLASE in the last 168 hours. No results for input(s): AMMONIA in the last 168 hours. Coagulation Profile: No results  for input(s): INR, PROTIME in the last 168 hours. Cardiac Enzymes: No results for input(s): CKTOTAL, CKMB, CKMBINDEX, TROPONINI in the last 168 hours. BNP (last 3 results) Recent Labs    09/06/24 1530  PROBNP 6,771*   HbA1C: No results for input(s): HGBA1C in the last 72 hours. CBG: No results for input(s): GLUCAP in the last 168 hours. Lipid Profile: No results for input(s): CHOL, HDL, LDLCALC, TRIG, CHOLHDL, LDLDIRECT in the last 72  hours. Thyroid  Function Tests: No results for input(s): TSH, T4TOTAL, FREET4, T3FREE, THYROIDAB in the last 72 hours. Anemia Panel: No results for input(s): VITAMINB12, FOLATE, FERRITIN, TIBC, IRON, RETICCTPCT in the last 72 hours. Urine analysis:    Component Value Date/Time   COLORURINE YELLOW 06/26/2024 0233   APPEARANCEUR CLEAR 06/26/2024 0233   LABSPEC 1.018 06/26/2024 0233   PHURINE 5.0 06/26/2024 0233   GLUCOSEU NEGATIVE 06/26/2024 0233   HGBUR NEGATIVE 06/26/2024 0233   BILIRUBINUR NEGATIVE 06/26/2024 0233   KETONESUR NEGATIVE 06/26/2024 0233   PROTEINUR NEGATIVE 06/26/2024 0233   NITRITE NEGATIVE 06/26/2024 0233   LEUKOCYTESUR NEGATIVE 06/26/2024 0233   Sepsis Labs: @LABRCNTIP (procalcitonin:4,lacticidven:4)  ) Recent Results (from the past 240 hours)  Resp panel by RT-PCR (RSV, Flu A&B, Covid) Anterior Nasal Swab     Status: None   Collection Time: 09/26/24  1:30 PM   Specimen: Anterior Nasal Swab  Result Value Ref Range Status   SARS Coronavirus 2 by RT PCR NEGATIVE NEGATIVE Final   Influenza A by PCR NEGATIVE NEGATIVE Final   Influenza B by PCR NEGATIVE NEGATIVE Final    Comment: (NOTE) The Xpert Xpress SARS-CoV-2/FLU/RSV plus assay is intended as an aid in the diagnosis of influenza from Nasopharyngeal swab specimens and should not be used as a sole basis for treatment. Nasal washings and aspirates are unacceptable for Xpert Xpress SARS-CoV-2/FLU/RSV testing.  Fact Sheet for Patients: bloggercourse.com  Fact Sheet for Healthcare Providers: seriousbroker.it  This test is not yet approved or cleared by the [place]  FDA and has been authorized for detection and/or diagnosis of SARS-CoV-2 by FDA under an Emergency Use Authorization (EUA). This EUA will remain in effect (meaning this test can be used) for the duration of the COVID-19 declaration under Section 564(b)(1) of the Act, 21  U.S.C. section 360bbb-3(b)(1), unless the authorization is terminated or revoked.     Resp Syncytial Virus by PCR NEGATIVE NEGATIVE Final    Comment: (NOTE) Fact Sheet for Patients: bloggercourse.com  Fact Sheet for Healthcare Providers: seriousbroker.it  This test is not yet approved or cleared by the [place]  FDA and has been authorized for detection and/or diagnosis of SARS-CoV-2 by FDA under an Emergency Use Authorization (EUA). This EUA will remain in effect (meaning this test can be used) for the duration of the COVID-19 declaration under Section 564(b)(1) of the Act, 21 U.S.C. section 360bbb-3(b)(1), unless the authorization is terminated or revoked.  Performed at [facility], [address], [place], [place] [address]      Radiology Studies:   DG Chest 2 View Result Date: 09/26/2024 CLINICAL DATA:  sob EXAM: CHEST - 2 VIEW COMPARISON:  September 06, 2024 FINDINGS: The cardiomediastinal silhouette is unchanged in contour.Atherosclerotic calcifications of the aorta. Moderate LEFT and small RIGHT pleural effusion. No pneumothorax. Diffuse interstitial prominence. Visualized abdomen is unremarkable. Mild multilevel degenerative changes of the thoracic spine. IMPRESSION: Constellation of findings are favored to reflect pulmonary edema with moderate LEFT and small RIGHT pleural effusions. Electronically Signed   By: Corean  Peacock M.D.   On: 09/26/2024 14:43     Scheduled Meds:  aspirin  EC  81 mg Oral Daily   atorvastatin   40 mg Oral Daily   clopidogrel   75 mg Oral Daily   furosemide   40 mg Intravenous BID   heparin   5,000 Units Subcutaneous Q8H   levothyroxine   88 mcg Oral Q0600   sodium chloride  flush  3 mL Intravenous Q12H   Continuous Infusions:  sodium chloride        LOS: 2 days    Time spent:    Sigurd Pac, MD [facility]   09/28/2024, 9:58 AM

## 2024-09-28 NOTE — Progress Notes (Signed)
 Heart Failure Navigator Progress Note  Assessed for Heart & Vascular TOC clinic readiness.  Patient does not meet criteria due to will be scheduled for a TAVR procedure on 10/05/2024. No HF TOC at this time. .   Navigator will sign off at this time.   Stephane Haddock, BSN, Scientist, Clinical (histocompatibility And Immunogenetics) Only

## 2024-09-28 NOTE — Interval H&P Note (Signed)
 History and Physical Interval Note:  09/28/2024 7:58 AM  [name]  has presented today for surgery, with the diagnosis of heart failure.  The various methods of treatment have been discussed with the patient and family. After consideration of risks, benefits and other options for treatment, the patient has consented to  Procedure(s): RIGHT HEART CATH (N/A) as a surgical intervention.  The patient's history has been reviewed, patient examined, no change in status, stable for surgery.  I have reviewed the patient's chart and labs.  Questions were answered to the patient's satisfaction.     [name]

## 2024-09-29 ENCOUNTER — Encounter (HOSPITAL_COMMUNITY): Payer: Self-pay | Admitting: Internal Medicine

## 2024-10-04 ENCOUNTER — Other Ambulatory Visit: Payer: Self-pay

## 2024-10-04 ENCOUNTER — Encounter (HOSPITAL_COMMUNITY): Admission: RE | Disposition: A | Payer: Self-pay | Source: Home / Self Care | Attending: Cardiology

## 2024-10-04 ENCOUNTER — Inpatient Hospital Stay (HOSPITAL_COMMUNITY)
Admission: RE | Admit: 2024-10-04 | Discharge: 2024-10-06 | DRG: 266 | Disposition: A | Attending: Cardiovascular Disease | Admitting: Cardiovascular Disease

## 2024-10-04 ENCOUNTER — Inpatient Hospital Stay (HOSPITAL_COMMUNITY)

## 2024-10-04 DIAGNOSIS — I5033 Acute on chronic diastolic (congestive) heart failure: Principal | ICD-10-CM | POA: Diagnosis present

## 2024-10-04 DIAGNOSIS — I2510 Atherosclerotic heart disease of native coronary artery without angina pectoris: Secondary | ICD-10-CM | POA: Diagnosis present

## 2024-10-04 DIAGNOSIS — E119 Type 2 diabetes mellitus without complications: Secondary | ICD-10-CM

## 2024-10-04 DIAGNOSIS — Z01818 Encounter for other preprocedural examination: Secondary | ICD-10-CM | POA: Diagnosis not present

## 2024-10-04 DIAGNOSIS — E1122 Type 2 diabetes mellitus with diabetic chronic kidney disease: Secondary | ICD-10-CM | POA: Diagnosis not present

## 2024-10-04 DIAGNOSIS — I10 Essential (primary) hypertension: Secondary | ICD-10-CM | POA: Diagnosis not present

## 2024-10-04 DIAGNOSIS — E039 Hypothyroidism, unspecified: Secondary | ICD-10-CM | POA: Diagnosis not present

## 2024-10-04 DIAGNOSIS — Z79899 Other long term (current) drug therapy: Secondary | ICD-10-CM

## 2024-10-04 DIAGNOSIS — Z79890 Hormone replacement therapy (postmenopausal): Secondary | ICD-10-CM | POA: Diagnosis not present

## 2024-10-04 DIAGNOSIS — I779 Disorder of arteries and arterioles, unspecified: Secondary | ICD-10-CM | POA: Diagnosis present

## 2024-10-04 DIAGNOSIS — Z006 Encounter for examination for normal comparison and control in clinical research program: Secondary | ICD-10-CM | POA: Diagnosis not present

## 2024-10-04 DIAGNOSIS — I350 Nonrheumatic aortic (valve) stenosis: Principal | ICD-10-CM | POA: Diagnosis present

## 2024-10-04 DIAGNOSIS — I493 Ventricular premature depolarization: Secondary | ICD-10-CM | POA: Diagnosis not present

## 2024-10-04 DIAGNOSIS — N1832 Chronic kidney disease, stage 3b: Secondary | ICD-10-CM | POA: Diagnosis not present

## 2024-10-04 DIAGNOSIS — Z952 Presence of prosthetic heart valve: Principal | ICD-10-CM

## 2024-10-04 DIAGNOSIS — Z9861 Coronary angioplasty status: Secondary | ICD-10-CM

## 2024-10-04 DIAGNOSIS — Z7982 Long term (current) use of aspirin: Secondary | ICD-10-CM

## 2024-10-04 DIAGNOSIS — Z7902 Long term (current) use of antithrombotics/antiplatelets: Secondary | ICD-10-CM | POA: Diagnosis not present

## 2024-10-04 DIAGNOSIS — Z87891 Personal history of nicotine dependence: Secondary | ICD-10-CM | POA: Diagnosis not present

## 2024-10-04 DIAGNOSIS — R918 Other nonspecific abnormal finding of lung field: Secondary | ICD-10-CM | POA: Diagnosis not present

## 2024-10-04 DIAGNOSIS — I4510 Unspecified right bundle-branch block: Secondary | ICD-10-CM | POA: Diagnosis present

## 2024-10-04 DIAGNOSIS — Z955 Presence of coronary angioplasty implant and graft: Secondary | ICD-10-CM

## 2024-10-04 DIAGNOSIS — I5043 Acute on chronic combined systolic (congestive) and diastolic (congestive) heart failure: Secondary | ICD-10-CM | POA: Diagnosis not present

## 2024-10-04 DIAGNOSIS — L8990 Pressure ulcer of unspecified site, unspecified stage: Secondary | ICD-10-CM

## 2024-10-04 DIAGNOSIS — I130 Hypertensive heart and chronic kidney disease with heart failure and stage 1 through stage 4 chronic kidney disease, or unspecified chronic kidney disease: Secondary | ICD-10-CM | POA: Diagnosis present

## 2024-10-04 DIAGNOSIS — E785 Hyperlipidemia, unspecified: Secondary | ICD-10-CM | POA: Diagnosis present

## 2024-10-04 DIAGNOSIS — I5022 Chronic systolic (congestive) heart failure: Secondary | ICD-10-CM | POA: Diagnosis not present

## 2024-10-04 DIAGNOSIS — Z48812 Encounter for surgical aftercare following surgery on the circulatory system: Secondary | ICD-10-CM | POA: Diagnosis not present

## 2024-10-04 DIAGNOSIS — N183 Chronic kidney disease, stage 3 unspecified: Secondary | ICD-10-CM | POA: Diagnosis present

## 2024-10-04 DIAGNOSIS — J90 Pleural effusion, not elsewhere classified: Secondary | ICD-10-CM | POA: Diagnosis not present

## 2024-10-04 HISTORY — PX: RIGHT HEART CATH: CATH118263

## 2024-10-04 LAB — POCT I-STAT EG7
Acid-Base Excess: 0 mmol/L (ref 0.0–2.0)
Acid-Base Excess: 0 mmol/L (ref 0.0–2.0)
Bicarbonate: 23.4 mmol/L (ref 20.0–28.0)
Bicarbonate: 24.4 mmol/L (ref 20.0–28.0)
Calcium, Ion: 1.2 mmol/L (ref 1.15–1.40)
Calcium, Ion: 1.24 mmol/L (ref 1.15–1.40)
HCT: 37 % — ABNORMAL LOW (ref 39.0–52.0)
HCT: 37 % — ABNORMAL LOW (ref 39.0–52.0)
Hemoglobin: 12.6 g/dL — ABNORMAL LOW (ref 13.0–17.0)
Hemoglobin: 12.6 g/dL — ABNORMAL LOW (ref 13.0–17.0)
O2 Saturation: 60 %
O2 Saturation: 60 %
Potassium: 4.4 mmol/L (ref 3.5–5.1)
Potassium: 4.4 mmol/L (ref 3.5–5.1)
Sodium: 130 mmol/L — ABNORMAL LOW (ref 135–145)
Sodium: 131 mmol/L — ABNORMAL LOW (ref 135–145)
TCO2: 24 mmol/L (ref 22–32)
TCO2: 26 mmol/L (ref 22–32)
pCO2, Ven: 34.4 mmHg — ABNORMAL LOW (ref 44–60)
pCO2, Ven: 36.6 mmHg — ABNORMAL LOW (ref 44–60)
pH, Ven: 7.433 — ABNORMAL HIGH (ref 7.25–7.43)
pH, Ven: 7.441 — ABNORMAL HIGH (ref 7.25–7.43)
pO2, Ven: 30 mmHg — CL (ref 32–45)
pO2, Ven: 30 mmHg — CL (ref 32–45)

## 2024-10-04 LAB — URINALYSIS, ROUTINE W REFLEX MICROSCOPIC
Bilirubin Urine: NEGATIVE
Glucose, UA: NEGATIVE mg/dL
Hgb urine dipstick: NEGATIVE
Ketones, ur: NEGATIVE mg/dL
Leukocytes,Ua: NEGATIVE
Nitrite: NEGATIVE
Protein, ur: NEGATIVE mg/dL
Specific Gravity, Urine: 1.009 (ref 1.005–1.030)
pH: 6 (ref 5.0–8.0)

## 2024-10-04 LAB — COMPREHENSIVE METABOLIC PANEL WITH GFR
ALT: 18 U/L (ref 0–44)
AST: 21 U/L (ref 15–41)
Albumin: 3.4 g/dL — ABNORMAL LOW (ref 3.5–5.0)
Alkaline Phosphatase: 73 U/L (ref 38–126)
Anion gap: 11 (ref 5–15)
BUN: 25 mg/dL — ABNORMAL HIGH (ref 8–23)
CO2: 24 mmol/L (ref 22–32)
Calcium: 8.7 mg/dL — ABNORMAL LOW (ref 8.9–10.3)
Chloride: 96 mmol/L — ABNORMAL LOW (ref 98–111)
Creatinine, Ser: 1.79 mg/dL — ABNORMAL HIGH (ref 0.61–1.24)
GFR, Estimated: 35 mL/min — ABNORMAL LOW (ref >60)
Glucose, Bld: 158 mg/dL — ABNORMAL HIGH (ref 70–99)
Potassium: 4.4 mmol/L (ref 3.5–5.1)
Sodium: 131 mmol/L — ABNORMAL LOW (ref 135–145)
Total Bilirubin: 1.4 mg/dL — ABNORMAL HIGH (ref 0.0–1.2)
Total Protein: 6 g/dL — ABNORMAL LOW (ref 6.5–8.1)

## 2024-10-04 LAB — TYPE AND SCREEN
ABO/RH(D): B NEG
Antibody Screen: NEGATIVE

## 2024-10-04 LAB — CBC WITH DIFFERENTIAL/PLATELET
Abs Immature Granulocytes: 0.05 K/uL (ref 0.00–0.07)
Basophils Absolute: 0 K/uL (ref 0.0–0.1)
Basophils Relative: 0 %
Eosinophils Absolute: 0.2 K/uL (ref 0.0–0.5)
Eosinophils Relative: 2 %
HCT: 39 % (ref 39.0–52.0)
Hemoglobin: 13.3 g/dL (ref 13.0–17.0)
Immature Granulocytes: 1 %
Lymphocytes Relative: 15 %
Lymphs Abs: 1.3 K/uL (ref 0.7–4.0)
MCH: 31.4 pg (ref 26.0–34.0)
MCHC: 34.1 g/dL (ref 30.0–36.0)
MCV: 92 fL (ref 80.0–100.0)
Monocytes Absolute: 0.9 K/uL (ref 0.1–1.0)
Monocytes Relative: 10 %
Neutro Abs: 6.6 K/uL (ref 1.7–7.7)
Neutrophils Relative %: 72 %
Platelets: 301 K/uL (ref 150–400)
RBC: 4.24 MIL/uL (ref 4.22–5.81)
RDW: 12.7 % (ref 11.5–15.5)
WBC: 9 K/uL (ref 4.0–10.5)
nRBC: 0 % (ref 0.0–0.2)

## 2024-10-04 LAB — SURGICAL PCR SCREEN
MRSA, PCR: NEGATIVE
Staphylococcus aureus: NEGATIVE

## 2024-10-04 LAB — MAGNESIUM: Magnesium: 1.9 mg/dL (ref 1.7–2.4)

## 2024-10-04 LAB — ABO/RH: ABO/RH(D): B NEG

## 2024-10-04 LAB — BRAIN NATRIURETIC PEPTIDE: B Natriuretic Peptide: 1552.5 pg/mL — ABNORMAL HIGH (ref 0.0–100.0)

## 2024-10-04 LAB — PROTIME-INR
INR: 1 (ref 0.8–1.2)
Prothrombin Time: 14.2 s (ref 11.4–15.2)

## 2024-10-04 LAB — GLUCOSE, CAPILLARY
Glucose-Capillary: 121 mg/dL — ABNORMAL HIGH (ref 70–99)
Glucose-Capillary: 99 mg/dL (ref 70–99)

## 2024-10-04 SURGERY — RIGHT HEART CATH
Anesthesia: LOCAL

## 2024-10-04 MED ORDER — HEPARIN (PORCINE) IN NACL 1000-0.9 UT/500ML-% IV SOLN
INTRAVENOUS | Status: DC | PRN
  Administered 2024-10-04: 500 mL

## 2024-10-04 MED ORDER — FUROSEMIDE 10 MG/ML IJ SOLN
40.0000 mg | Freq: Four times a day (QID) | INTRAMUSCULAR | Status: AC
  Administered 2024-10-04 (×2): 40 mg via INTRAVENOUS
  Filled 2024-10-04 (×2): qty 4

## 2024-10-04 MED ORDER — CLOPIDOGREL BISULFATE 75 MG PO TABS: 75.0000 mg | ORAL_TABLET | Freq: Every day | ORAL | Status: DC

## 2024-10-04 MED ORDER — SODIUM CHLORIDE 0.9 % IV SOLN: 250.0000 mL | INTRAVENOUS | Status: AC | PRN

## 2024-10-04 MED ORDER — SODIUM CHLORIDE 0.9 % IV SOLN: 250.0000 mL | INTRAVENOUS | Status: DC | PRN

## 2024-10-04 MED ORDER — DEXMEDETOMIDINE HCL IN NACL 400 MCG/100ML IV SOLN
0.1000 ug/kg/h | INTRAVENOUS | Status: DC
  Filled 2024-10-04: qty 100

## 2024-10-04 MED ORDER — CEFAZOLIN SODIUM-DEXTROSE 2-4 GM/100ML-% IV SOLN
2.0000 g | INTRAVENOUS | Status: DC
  Filled 2024-10-04: qty 100

## 2024-10-04 MED ORDER — CHLORHEXIDINE GLUCONATE 0.12 % MT SOLN
15.0000 mL | Freq: Once | OROMUCOSAL | Status: AC
  Administered 2024-10-05: 15 mL via OROMUCOSAL
  Filled 2024-10-04: qty 15

## 2024-10-04 MED ORDER — SODIUM CHLORIDE 0.9% FLUSH: 3.0000 mL | INTRAVENOUS | Status: DC | PRN

## 2024-10-04 MED ORDER — BISACODYL 5 MG PO TBEC
5.0000 mg | DELAYED_RELEASE_TABLET | Freq: Once | ORAL | Status: AC
  Administered 2024-10-04: 5 mg via ORAL
  Filled 2024-10-04: qty 1

## 2024-10-04 MED ORDER — ASPIRIN 81 MG PO TBEC: 81.0000 mg | DELAYED_RELEASE_TABLET | Freq: Every day | ORAL | Status: DC

## 2024-10-04 MED ORDER — SODIUM CHLORIDE 0.9% IV SOLUTION: INTRAVENOUS | Status: DC

## 2024-10-04 MED ORDER — TEMAZEPAM 7.5 MG PO CAPS: 15.0000 mg | ORAL_CAPSULE | Freq: Once | ORAL | Status: DC | PRN

## 2024-10-04 MED ORDER — NOREPINEPHRINE 4 MG/250ML-% IV SOLN
0.0000 ug/min | INTRAVENOUS | Status: DC
  Filled 2024-10-04: qty 250

## 2024-10-04 MED ORDER — LIDOCAINE HCL (PF) 1 % IJ SOLN
INTRAMUSCULAR | Status: DC | PRN
  Administered 2024-10-04: 5 mL via INTRADERMAL

## 2024-10-04 MED ORDER — CHLORHEXIDINE GLUCONATE 4 % EX SOLN
1.0000 | Freq: Once | CUTANEOUS | Status: AC
  Administered 2024-10-05: 1 via TOPICAL
  Filled 2024-10-04: qty 15

## 2024-10-04 MED ORDER — SODIUM CHLORIDE 0.9% FLUSH: 3.0000 mL | Freq: Two times a day (BID) | INTRAVENOUS | Status: DC

## 2024-10-04 MED ORDER — SODIUM CHLORIDE 0.9 % IV SOLN
INTRAVENOUS | Status: AC | PRN
  Administered 2024-10-04: 10 mL/h via INTRAVENOUS

## 2024-10-04 MED ORDER — CHLORHEXIDINE GLUCONATE CLOTH 2 % EX PADS
6.0000 | MEDICATED_PAD | Freq: Every day | CUTANEOUS | Status: DC
  Administered 2024-10-04 – 2024-10-05 (×2): 6 via TOPICAL

## 2024-10-04 MED ORDER — SODIUM CHLORIDE 0.9 % IV SOLN: INTRAVENOUS | Status: AC

## 2024-10-04 MED ORDER — FUROSEMIDE 10 MG/ML IJ SOLN: 40.0000 mg | Freq: Once | INTRAMUSCULAR | Status: DC

## 2024-10-04 MED ORDER — SODIUM CHLORIDE 0.9% IV SOLUTION: INTRAVENOUS | Status: DC | PRN

## 2024-10-04 MED ORDER — MAGNESIUM SULFATE 50 % IJ SOLN
40.0000 meq | INTRAMUSCULAR | Status: DC
  Filled 2024-10-04 (×2): qty 9.85

## 2024-10-04 MED ORDER — ACETAMINOPHEN 325 MG PO TABS: 650.0000 mg | ORAL_TABLET | ORAL | Status: DC | PRN

## 2024-10-04 MED ORDER — ATORVASTATIN CALCIUM 40 MG PO TABS
40.0000 mg | ORAL_TABLET | Freq: Every day | ORAL | Status: DC
  Administered 2024-10-05 – 2024-10-06 (×2): 40 mg via ORAL
  Filled 2024-10-04 (×2): qty 1

## 2024-10-04 MED ORDER — SODIUM CHLORIDE 0.9% FLUSH
3.0000 mL | Freq: Two times a day (BID) | INTRAVENOUS | Status: DC
  Administered 2024-10-04 – 2024-10-06 (×4): 3 mL via INTRAVENOUS

## 2024-10-04 MED ORDER — POTASSIUM CHLORIDE 2 MEQ/ML IV SOLN
80.0000 meq | INTRAVENOUS | Status: DC
  Filled 2024-10-04: qty 40

## 2024-10-04 MED ORDER — AMIODARONE HCL 200 MG PO TABS
200.0000 mg | ORAL_TABLET | Freq: Two times a day (BID) | ORAL | Status: DC
  Administered 2024-10-04 – 2024-10-06 (×5): 200 mg via ORAL
  Filled 2024-10-04 (×5): qty 1

## 2024-10-04 MED ORDER — LEVOTHYROXINE SODIUM 88 MCG PO TABS
88.0000 ug | ORAL_TABLET | Freq: Every day | ORAL | Status: DC
  Administered 2024-10-05 – 2024-10-06 (×2): 88 ug via ORAL
  Filled 2024-10-04 (×3): qty 1

## 2024-10-04 MED ORDER — HEPARIN 30,000 UNITS/1000 ML (OHS) CELLSAVER SOLUTION
Status: DC
  Filled 2024-10-04 (×2): qty 1000

## 2024-10-04 SURGICAL SUPPLY — 7 items
CATH SWAN GANZ 7F STRAIGHT (CATHETERS) IMPLANT
KIT MICROPUNCTURE NIT STIFF (SHEATH) IMPLANT
PACK CARDIAC CATHETERIZATION (CUSTOM PROCEDURE TRAY) ×2 IMPLANT
SHEATH PINNACLE 7F 10CM (SHEATH) IMPLANT
SHEATH PROBE COVER 6X72 (BAG) IMPLANT
TRANSDUCER W/MONITORING KIT (MISCELLANEOUS) IMPLANT
TUBING ART PRESS 72 MALE/FEM (TUBING) IMPLANT

## 2024-10-04 NOTE — H&P (Addendum)
 Advanced Heart Failure Team History and Physical Note   PCP:  Nche, [name], NP  PCP-Cardiology: Lonni Cash, MD     Reason for Admission: Severe AS   HPI:   [name] is a 89 y.o. adult with CAD s/p previous PCI to RCA with 80-90% ISR at the distal margin of proximal/mid RCA stent, s/p PCI LM/LAD w/ IABP on 09/15/24), DMT2, HFrEF, RBBB, HTN, HLD, hypothyroidism, mod-severe MR and severe AS who presents for scheduled RHC in anticipation of TAVR tomorrow.  Patient presents for planned swan placement and optimization in anticipation of TAVR tomorrow, previous surgical turn down. Overall doing well, spent time with family over Thanksgiving. Plan for R internal jugular swan placement and ICU admission.     Home Medications Prior to Admission medications   Medication Sig Start Date End Date Taking? Authorizing Provider  acetaminophen  (TYLENOL ) 500 MG tablet Take 1,000 mg by mouth every 6 (six) hours as needed for mild pain (pain score 1-3) or headache.   Yes [provider]  amiodarone  (PACERONE ) 200 MG tablet Take 1 tablet (200 mg total) by mouth 2 (two) times daily. 09/28/24  Yes Fairy Frames, MD  aspirin  EC 81 MG tablet Take 1 tablet (81 mg total) by mouth daily. Swallow whole. 09/12/24  Yes [name], MD  atorvastatin  (LIPITOR) 40 MG tablet Take 1 tablet (40 mg total) by mouth daily. 09/12/24  Yes [name], MD  clopidogrel  (PLAVIX ) 75 MG tablet Take 1 tablet (75 mg total) by mouth daily. 04/05/24  Yes [name], [name], MD  fish oil-omega-3 fatty acids  1000 MG capsule Take 1 g by mouth daily.   Yes [provider]  furosemide  (LASIX ) 40 MG tablet Take 1 tablet (40 mg total) by mouth daily. 09/28/24  Yes Fairy Frames, MD  levothyroxine  (SYNTHROID ) 88 MCG tablet Take 1 tablet (88 mcg total) by mouth daily before breakfast. 12/22/23  Yes Nche, [name], NP  Multiple Vitamins-Minerals (CENTRUM SILVER MEN 50+) TABS Take 1 tablet by  mouth in the morning.   Yes [provider]  potassium chloride  SA (KLOR-CON  M) 20 MEQ tablet Take 1 tablet (20 mEq total) by mouth daily. 09/28/24  Yes Fairy Frames, MD    Allergies:  No Known Allergies  Objective:    Vital Signs:   Temp:  [97.7 F (36.5 C)] 97.7 F (36.5 C) (12/01 0757) Pulse Rate:  [66] 66 (12/01 0757) Resp:  [16] 16 (12/01 0757) BP: (118)/(61) 118/61 (12/01 0757) SpO2:  [99 %] 99 % (12/01 0757) Weight:  [69.9 kg] 69.9 kg (12/01 0757)   Filed Weights   10/04/24 0757  Weight: 69.9 kg     Physical Exam     GENERAL: NAD, fair appearing PULM:  Normal work of breathing, CTAB CARDIAC:  JVP: flat         Normal rate with regular rhythm. Systolic murmur with loss of S2, holosystolic murmur at the apex ABDOMEN: Soft, non-tender, non-distended. NEUROLOGIC: Patient is oriented x3 with no focal or lateralizing neurologic deficits.    Patient Profile   [name] is a 89 y.o. male with a PMH of CAD s/p recent IABP assisted PCI to the LM/LAD, known RCA lesion, RBBB, and severe AS who presents for RHC and scheduled TAVR for severe AS.   Assessment/Plan   Severe LFLG AS: - STS: 9.33%,  turned down for CAB/AVR by Dr. Daniel. - Posted for TAVR on 10/05/24 with Dr. Wonda and Dr. Daniel.  -  RHC today shows moderately elevated wedge of 23 with significant v waves with normal RA pressures, plan for gentle diuresis today, 40mg  IV lasix  x2 in anticipation of procedure tomorrow  Chronic HFrEF:  - Recent admission with excellent diuresis - Last RHC on 11/25 with RA 4, PCWP 12 without significant v waves, index severely reduced - Cardiac index 1.72 by TD today, no particularly symptomatic. Will hold off on inotropes given ectopy in the past, diuresis as above  PVCs/RBBB: - On amiodarone  200mg  BID for PVC suppression - Will need close monitoring post TAVR, relatively high risk for AVB post TAVR  CAD:  - Previous PCI to the RCA, now with 80% ISR, recent PCI  to the LM/LAD with IABP assistance on 09/15/24 - Continue aspirin /plavix  - Continue lipitor 40mg  daily - Staged intervention if needed to RCA post TAVR  Mod-severe MR: Suspect improved already with diuresis, reevaluate post TAVR  CRITICAL CARE Performed by: Morene JINNY Brownie   Total critical care time: 35 minutes  Critical care time was exclusive of separately billable procedures and treating other patients.  Critical care was necessary to treat or prevent imminent or life-threatening deterioration.  Critical care was time spent personally by me on the following activities: development of treatment plan with patient and/or surrogate as well as nursing, discussions with consultants, evaluation of patient's response to treatment, examination of patient, obtaining history from patient or surrogate, ordering and performing treatments and interventions, ordering and review of laboratory studies, ordering and review of radiographic studies, pulse oximetry and re-evaluation of patient's condition.   Morene JINNY Brownie, MD 10/04/2024, 9:07 AM  Advanced Heart Failure Team Pager [phone] (M-F; 7a - 5p)  Please contact [facility] for night-coverage after hours (4p -7a ) and weekends on [email or web address]

## 2024-10-05 ENCOUNTER — Inpatient Hospital Stay: Admitting: Nurse Practitioner

## 2024-10-05 ENCOUNTER — Inpatient Hospital Stay (HOSPITAL_COMMUNITY)

## 2024-10-05 ENCOUNTER — Inpatient Hospital Stay (HOSPITAL_COMMUNITY): Admitting: Certified Registered Nurse Anesthetist

## 2024-10-05 ENCOUNTER — Inpatient Hospital Stay (HOSPITAL_COMMUNITY): Admission: RE | Admit: 2024-10-05 | Admitting: Cardiovascular Disease

## 2024-10-05 ENCOUNTER — Encounter (HOSPITAL_COMMUNITY): Admission: RE | Disposition: A | Payer: Self-pay | Source: Home / Self Care | Attending: Cardiology

## 2024-10-05 ENCOUNTER — Encounter (HOSPITAL_COMMUNITY): Payer: Self-pay | Admitting: Cardiology

## 2024-10-05 DIAGNOSIS — I350 Nonrheumatic aortic (valve) stenosis: Secondary | ICD-10-CM

## 2024-10-05 DIAGNOSIS — L8990 Pressure ulcer of unspecified site, unspecified stage: Secondary | ICD-10-CM

## 2024-10-05 DIAGNOSIS — I2510 Atherosclerotic heart disease of native coronary artery without angina pectoris: Secondary | ICD-10-CM

## 2024-10-05 DIAGNOSIS — Z952 Presence of prosthetic heart valve: Principal | ICD-10-CM

## 2024-10-05 DIAGNOSIS — I10 Essential (primary) hypertension: Secondary | ICD-10-CM

## 2024-10-05 DIAGNOSIS — Z87891 Personal history of nicotine dependence: Secondary | ICD-10-CM | POA: Diagnosis not present

## 2024-10-05 HISTORY — PX: INTRAOPERATIVE TRANSTHORACIC ECHOCARDIOGRAM: SHX6523

## 2024-10-05 LAB — ECHOCARDIOGRAM LIMITED
AR max vel: 0.98 cm2
AV Area VTI: 0.95 cm2
AV Area mean vel: 0.88 cm2
AV Mean grad: 23 mmHg
AV Peak grad: 30.9 mmHg
Ao pk vel: 2.78 m/s
Height: 69 in
Weight: 2342.17 [oz_av]

## 2024-10-05 LAB — POCT I-STAT, CHEM 8
BUN: 25 mg/dL — ABNORMAL HIGH (ref 8–23)
BUN: 29 mg/dL — ABNORMAL HIGH (ref 8–23)
Calcium, Ion: 1.11 mmol/L — ABNORMAL LOW (ref 1.15–1.40)
Calcium, Ion: 1.22 mmol/L (ref 1.15–1.40)
Chloride: 99 mmol/L (ref 98–111)
Chloride: 97 mmol/L — ABNORMAL LOW (ref 98–111)
Creatinine, Ser: 1.7 mg/dL — ABNORMAL HIGH (ref 0.61–1.24)
Creatinine, Ser: 1.8 mg/dL — ABNORMAL HIGH (ref 0.61–1.24)
Glucose, Bld: 136 mg/dL — ABNORMAL HIGH (ref 70–99)
Glucose, Bld: 121 mg/dL — ABNORMAL HIGH (ref 70–99)
HCT: 35 % — ABNORMAL LOW (ref 39.0–52.0)
HCT: 38 % — ABNORMAL LOW (ref 39.0–52.0)
Hemoglobin: 11.9 g/dL — ABNORMAL LOW (ref 13.0–17.0)
Hemoglobin: 12.9 g/dL — ABNORMAL LOW (ref 13.0–17.0)
Potassium: 3.9 mmol/L (ref 3.5–5.1)
Potassium: 4.3 mmol/L (ref 3.5–5.1)
Sodium: 135 mmol/L (ref 135–145)
Sodium: 134 mmol/L — ABNORMAL LOW (ref 135–145)
TCO2: 23 mmol/L (ref 22–32)
TCO2: 21 mmol/L — ABNORMAL LOW (ref 22–32)

## 2024-10-05 LAB — CBC
HCT: 40.8 % (ref 39.0–52.0)
Hemoglobin: 13.5 g/dL (ref 13.0–17.0)
MCH: 30.6 pg (ref 26.0–34.0)
MCHC: 33.1 g/dL (ref 30.0–36.0)
MCV: 92.5 fL (ref 80.0–100.0)
Platelets: 272 K/uL (ref 150–400)
RBC: 4.41 MIL/uL (ref 4.22–5.81)
RDW: 12.6 % (ref 11.5–15.5)
WBC: 10.2 K/uL (ref 4.0–10.5)
nRBC: 0 % (ref 0.0–0.2)

## 2024-10-05 LAB — COMPREHENSIVE METABOLIC PANEL WITH GFR
ALT: 20 U/L (ref 0–44)
AST: 20 U/L (ref 15–41)
Albumin: 3.3 g/dL — ABNORMAL LOW (ref 3.5–5.0)
Alkaline Phosphatase: 73 U/L (ref 38–126)
Anion gap: 11 (ref 5–15)
BUN: 27 mg/dL — ABNORMAL HIGH (ref 8–23)
CO2: 25 mmol/L (ref 22–32)
Calcium: 8.7 mg/dL — ABNORMAL LOW (ref 8.9–10.3)
Chloride: 95 mmol/L — ABNORMAL LOW (ref 98–111)
Creatinine, Ser: 1.78 mg/dL — ABNORMAL HIGH (ref 0.61–1.24)
GFR, Estimated: 36 mL/min — ABNORMAL LOW (ref >60)
Glucose, Bld: 119 mg/dL — ABNORMAL HIGH (ref 70–99)
Potassium: 3.7 mmol/L (ref 3.5–5.1)
Sodium: 131 mmol/L — ABNORMAL LOW (ref 135–145)
Total Bilirubin: 1.2 mg/dL (ref 0.0–1.2)
Total Protein: 5.9 g/dL — ABNORMAL LOW (ref 6.5–8.1)

## 2024-10-05 LAB — POCT ACTIVATED CLOTTING TIME: Activated Clotting Time: 327 s

## 2024-10-05 LAB — APTT: aPTT: 39 s — ABNORMAL HIGH (ref 24–36)

## 2024-10-05 LAB — GLUCOSE, CAPILLARY
Glucose-Capillary: 106 mg/dL — ABNORMAL HIGH (ref 70–99)
Glucose-Capillary: 90 mg/dL (ref 70–99)
Glucose-Capillary: 100 mg/dL — ABNORMAL HIGH (ref 70–99)

## 2024-10-05 LAB — COOXEMETRY PANEL
Carboxyhemoglobin: 2.1 % — ABNORMAL HIGH (ref 0.5–1.5)
Methemoglobin: <0.7 % (ref 0.0–1.5)
O2 Saturation: 63.4 %
Total hemoglobin: 14.3 g/dL (ref 12.0–16.0)

## 2024-10-05 LAB — MAGNESIUM: Magnesium: 2 mg/dL (ref 1.7–2.4)

## 2024-10-05 SURGERY — TRANSCATHETER AORTIC VALVE REPLACEMENT, TRANSFEMORAL (CATHLAB)
Anesthesia: Monitor Anesthesia Care

## 2024-10-05 MED ORDER — NOREPINEPHRINE BITARTRATE 1 MG/ML IV SOLN
INTRAVENOUS | Status: DC | PRN
  Administered 2024-10-05: .5 mL via INTRAVENOUS

## 2024-10-05 MED ORDER — LACTATED RINGERS IV SOLN: INTRAVENOUS | Status: DC | PRN

## 2024-10-05 MED ORDER — SODIUM CHLORIDE 0.9 % IV SOLN: 250.0000 mL | INTRAVENOUS | Status: AC | PRN

## 2024-10-05 MED ORDER — SODIUM CHLORIDE 0.9 % IV SOLN: INTRAVENOUS | Status: AC

## 2024-10-05 MED ORDER — AMIODARONE IV BOLUS ONLY 150 MG/100ML: 150.0000 mg | Freq: Once | INTRAVENOUS | Status: DC

## 2024-10-05 MED ORDER — OXYCODONE HCL 5 MG PO TABS: 5.0000 mg | ORAL_TABLET | ORAL | Status: DC | PRN

## 2024-10-05 MED ORDER — HEPARIN SODIUM (PORCINE) 1000 UNIT/ML IJ SOLN
INTRAMUSCULAR | Status: DC | PRN
  Administered 2024-10-05: 10000 [IU] via INTRAVENOUS

## 2024-10-05 MED ORDER — CHLORHEXIDINE GLUCONATE 0.12 % MT SOLN
OROMUCOSAL | Status: AC
  Filled 2024-10-05: qty 15

## 2024-10-05 MED ORDER — IODIXANOL 320 MG/ML IV SOLN
INTRAVENOUS | Status: DC | PRN
  Administered 2024-10-05: 60 mL via INTRA_ARTERIAL

## 2024-10-05 MED ORDER — ALBUMIN HUMAN 5 % IV SOLN: 12.5000 g | Freq: Once | INTRAVENOUS | Status: AC

## 2024-10-05 MED ORDER — DEXMEDETOMIDINE HCL IN NACL 400 MCG/100ML IV SOLN
INTRAVENOUS | Status: DC | PRN
  Administered 2024-10-05: 1 ug/h via INTRAVENOUS

## 2024-10-05 MED ORDER — LACTATED RINGERS IV SOLN: INTRAVENOUS | Status: DC

## 2024-10-05 MED ORDER — ASPIRIN 81 MG PO TBEC
81.0000 mg | DELAYED_RELEASE_TABLET | Freq: Every day | ORAL | Status: DC
  Administered 2024-10-05 – 2024-10-06 (×2): 81 mg via ORAL
  Filled 2024-10-05 (×2): qty 1

## 2024-10-05 MED ORDER — SODIUM CHLORIDE 0.9 % IV SOLN: 250.0000 mL | INTRAVENOUS | Status: DC | PRN

## 2024-10-05 MED ORDER — PROPOFOL 500 MG/50ML IV EMUL
INTRAVENOUS | Status: DC | PRN
  Administered 2024-10-05: 25 ug/kg/min via INTRAVENOUS

## 2024-10-05 MED ORDER — NOREPINEPHRINE 4 MG/250ML-% IV SOLN
INTRAVENOUS | Status: AC
  Administered 2024-10-05: 2 ug/min via INTRAVENOUS
  Filled 2024-10-05: qty 250

## 2024-10-05 MED ORDER — MORPHINE SULFATE (PF) 2 MG/ML IV SOLN: 1.0000 mg | INTRAVENOUS | Status: DC | PRN

## 2024-10-05 MED ORDER — ALBUMIN HUMAN 5 % IV SOLN
INTRAVENOUS | Status: AC
  Administered 2024-10-05: 12.5 g via INTRAVENOUS
  Filled 2024-10-05: qty 250

## 2024-10-05 MED ORDER — HEPARIN (PORCINE) IN NACL 1000-0.9 UT/500ML-% IV SOLN
INTRAVENOUS | Status: DC | PRN
  Administered 2024-10-05: 500 mL

## 2024-10-05 MED ORDER — POTASSIUM CHLORIDE CRYS ER 20 MEQ PO TBCR
40.0000 meq | EXTENDED_RELEASE_TABLET | Freq: Once | ORAL | Status: AC
  Administered 2024-10-05: 40 meq via ORAL
  Filled 2024-10-05: qty 2

## 2024-10-05 MED ORDER — SODIUM CHLORIDE 0.9% FLUSH
3.0000 mL | Freq: Two times a day (BID) | INTRAVENOUS | Status: DC
  Administered 2024-10-06 (×2): 3 mL via INTRAVENOUS

## 2024-10-05 MED ORDER — HEPARIN (PORCINE) IN NACL 2000-0.9 UNIT/L-% IV SOLN
INTRAVENOUS | Status: DC | PRN
  Administered 2024-10-05: 1000 mL

## 2024-10-05 MED ORDER — CEFAZOLIN SODIUM-DEXTROSE 2-4 GM/100ML-% IV SOLN
2.0000 g | Freq: Three times a day (TID) | INTRAVENOUS | Status: AC
  Administered 2024-10-05 – 2024-10-06 (×2): 2 g via INTRAVENOUS
  Filled 2024-10-05 (×2): qty 100

## 2024-10-05 MED ORDER — LIDOCAINE HCL (PF) 1 % IJ SOLN
INTRAMUSCULAR | Status: DC | PRN
  Administered 2024-10-05 (×2): 15 mL

## 2024-10-05 MED ORDER — CLOPIDOGREL BISULFATE 75 MG PO TABS
75.0000 mg | ORAL_TABLET | Freq: Every day | ORAL | Status: DC
  Administered 2024-10-05 – 2024-10-06 (×2): 75 mg via ORAL
  Filled 2024-10-05 (×2): qty 1

## 2024-10-05 MED ORDER — NOREPINEPHRINE 4 MG/250ML-% IV SOLN: 0.0000 ug/min | INTRAVENOUS | Status: DC

## 2024-10-05 MED ORDER — NOREPINEPHRINE 4 MG/250ML-% IV SOLN
INTRAVENOUS | Status: DC | PRN
  Administered 2024-10-05: 2 ug/min via INTRAVENOUS

## 2024-10-05 MED ORDER — PROTAMINE SULFATE 10 MG/ML IV SOLN
INTRAVENOUS | Status: DC | PRN
  Administered 2024-10-05: 40 mg via INTRAVENOUS
  Administered 2024-10-05: 10 mg via INTRAVENOUS

## 2024-10-05 MED ORDER — CEFAZOLIN SODIUM-DEXTROSE 2-3 GM-%(50ML) IV SOLR
INTRAVENOUS | Status: DC | PRN
  Administered 2024-10-05: 2 g via INTRAVENOUS

## 2024-10-05 MED ORDER — SODIUM CHLORIDE 0.9% FLUSH: 3.0000 mL | INTRAVENOUS | Status: DC | PRN

## 2024-10-05 SURGICAL SUPPLY — 30 items
BAG SNAP BAND KOVER 36X36 (MISCELLANEOUS) ×4 IMPLANT
CABLE ADAPT PACING TEMP 12FT (ADAPTER) IMPLANT
CATH 23 ULTRA DELIVERY (CATHETERS) IMPLANT
CATH DIAG 6FR PIGTAIL ANGLED (CATHETERS) IMPLANT
CATH INFINITI 5FR ANG PIGTAIL (CATHETERS) IMPLANT
CATH INFINITI 6F AL2 (CATHETERS) IMPLANT
CATH S G BIP PACING (CATHETERS) IMPLANT
CLOSURE MYNX CONTROL 6F/7F (Vascular Products) IMPLANT
CLOSURE PERCLOSE PROSTYLE (Vascular Products) IMPLANT
CRIMPER (MISCELLANEOUS) IMPLANT
DEVICE INFLATION ATRION QL2530 (MISCELLANEOUS) IMPLANT
KIT MICROPUNCTURE NIT STIFF (SHEATH) IMPLANT
KIT SAPIAN 3 ULTRA RESILIA 23 (Valve) IMPLANT
KIT SINGLE USE MANIFOLD (KITS) IMPLANT
PACK CARDIAC CATHETERIZATION (CUSTOM PROCEDURE TRAY) ×2 IMPLANT
PAD SORBX EP SHIELD 16.5X12 (MISCELLANEOUS) IMPLANT
SET ATX-X65L (MISCELLANEOUS) IMPLANT
SHEATH BRITE TIP 7FR 35CM (SHEATH) IMPLANT
SHEATH INTRODUCER SET 20-26 (SHEATH) IMPLANT
SHEATH PINNACLE 6F 10CM (SHEATH) IMPLANT
SHEATH PINNACLE 8F 10CM (SHEATH) IMPLANT
SHIELD CATHGARD ARROW (MISCELLANEOUS) IMPLANT
STOPCOCK MORSE 400PSI 3WAY (MISCELLANEOUS) ×4 IMPLANT
TRANSDUCER W/STOPCOCK (MISCELLANEOUS) IMPLANT
TUBING ART PRESS 72 MALE/FEM (TUBING) IMPLANT
WIRE AMPLATZ SS-J .035X180CM (WIRE) IMPLANT
WIRE EMERALD 3MM-J .035X150CM (WIRE) IMPLANT
WIRE EMERALD 3MM-J .035X260CM (WIRE) IMPLANT
WIRE EMERALD ST .035X260CM (WIRE) IMPLANT
WIRE SAFARI SM CURVE 275 (WIRE) IMPLANT

## 2024-10-05 NOTE — Progress Notes (Signed)
 Advanced Heart Failure Rounding Note  Cardiologist: Lonni Cash, MD  Chief Complaint: TAVR Subjective:    12/1 RHC: RA6, PA 49/20 (32), PCW 23, CO/CI (TD) 3.19/1.72  Co-ox 63%. CVP 0-1. Plan for TAVR today. Net - 2.4L. Weight down 3 lbs.   No SOB, CP.   Swan#s: PAP: (33-76)/(9-40) 39/13 CVP:  [0 mmHg-19 mmHg] 0 mmHg CO:  [2.9 L/min-3.2 L/min] 2.9 L/min CI:  [1.57 L/min/m2-1.8 L/min/m2] 1.6 L/min/m2  Objective:    Weight Range: 66.4 kg Body mass index is 21.62 kg/m.   Vital Signs:   Temp:  [97.7 F (36.5 C)-98.8 F (37.1 C)] 97.7 F (36.5 C) (12/02 0700) Pulse Rate:  [0-79] 53 (12/02 0700) Resp:  [0-28] 12 (12/02 0700) BP: (92-143)/(40-81) 115/45 (12/02 0600) SpO2:  [85 %-99 %] 98 % (12/02 0700) Weight:  [66.4 kg-69.9 kg] 66.4 kg (12/02 0500) Last BM Date : 10/04/24  Weight change: Filed Weights   10/04/24 0757 10/04/24 1100 10/05/24 0500  Weight: 69.9 kg 67.7 kg 66.4 kg   Intake/Output:  Intake/Output Summary (Last 24 hours) at 10/05/2024 0725 Last data filed at 10/05/2024 0641 Gross per 24 hour  Intake 278 ml  Output 2725 ml  Net -2447 ml    Physical Exam    General: Elderly appearing. No distress  Cardiac: JVP flat cm. 3/6 systolicmurmur Resp: Lung sounds clear and equal B/L Abdomen: Soft, non-distended.  Extremities: Warm and dry.  NO edema.  Neuro: A&O x3. Affect pleasant.   Telemetry   SR 60s (personally reviewed)  Labs    CBC Recent Labs    10/04/24 1007 10/05/24 0613  WBC 9.0 10.2  NEUTROABS 6.6  --   HGB 13.3 13.5  HCT 39.0 40.8  MCV 92.0 92.5  PLT 301 272   Basic Metabolic Panel Recent Labs    87/98/74 1007 10/05/24 0613  NA 131* 131*  K 4.4 3.7  CL 96* 95*  CO2 24 25  GLUCOSE 158* 119*  BUN 25* 27*  CREATININE 1.79* 1.78*  CALCIUM  8.7* 8.7*  MG 1.9 2.0   Liver Function Tests Recent Labs    10/04/24 1007 10/05/24 0613  AST 21 20  ALT 18 20  ALKPHOS 73 73  BILITOT 1.4* 1.2  PROT 6.0* 5.9*   ALBUMIN 3.4* 3.3*   BNP (last 3 results) Recent Labs    09/06/24 2019 09/26/24 1348 10/04/24 1007  BNP 1,123.1* 1,363.0* 1,552.5*    ProBNP (last 3 results) Recent Labs    09/06/24 1530  PROBNP 6,771*   Medications:    Scheduled Medications:  amiodarone   200 mg Oral BID   aspirin  EC  81 mg Oral Daily   atorvastatin   40 mg Oral Daily   chlorhexidine   1 Application Topical Once   chlorhexidine   15 mL Mouth/Throat Once   Chlorhexidine  Gluconate Cloth  6 each Topical Daily   clopidogrel   75 mg Oral Daily   levothyroxine   88 mcg Oral QAC breakfast   sodium chloride  flush  3 mL Intravenous Q12H    Infusions:  sodium chloride  10 mL/hr at 10/05/24 0641   sodium chloride      sodium chloride      sodium chloride  10 mL/hr at 10/05/24 0641    PRN Medications: sodium chloride , sodium chloride , acetaminophen , sodium chloride  flush, temazepam  Patient Profile   [name] is a 89 y.o. male with a PMH of CAD s/p recent IABP assisted PCI to the LM/LAD, known RCA lesion, RBBB, and severe AS who presents  for RHC and scheduled TAVR for severe AS.    Assessment/Plan   Severe LFLG AS: - STS: 9.33%,  turned down for CAB/AVR by Dr. Daniel. - RHC 12/1 shows moderately elevated wedge of 23 with significant v waves with normal RA pressures. Gently diuresed w IV Lasix  40 mg x2. - Posted for TAVR on 10/05/24 (today) with Dr. Wonda and Dr. Daniel.   Chronic HFrEF:  - Recent admission with excellent diuresis - Last RHC on 11/25 with RA 4, PCWP 12 without significant v waves, index severely reduced - Cardiac index 1.72 by TD 12/1, no particularly symptomatic. Not started on inotropes given ectopy in the past, diuresis as above - Co-ox 64%, CVP 0-1. Hold diuresis this morning   PVCs/RBBB: - On amiodarone  200mg  BID for PVC suppression - Will need close monitoring post TAVR, relatively high risk for AVB post TAVR   CAD:  - Previous PCI to the RCA, now with 80% ISR, recent PCI to the  LM/LAD with IABP assistance on 09/15/24 - Hold aspirin /plavix  this am will give tonight, per Structural heart - Continue lipitor 40mg  daily - Staged intervention if needed to RCA post TAVR   Mod-severe MR: Suspect improved after diuresis, reevaluate post TAVR   Length of Stay: 1  CRITICAL CARE Performed by: Arina Torry  Total critical care time: 8 minutes  -Critical care time was exclusive of separately billable procedures and treating other patients. -Critical care was necessary to treat or prevent imminent or life-threatening deterioration. -Critical care was time spent personally by me on the following activities: development of treatment plan with patient and/or surrogate as well as nursing, discussions with consultants, evaluation of patient's response to treatment, examination of patient, obtaining history from patient or surrogate, ordering and performing treatments and interventions, ordering and review of laboratory studies, ordering and review of radiographic studies, pulse oximetry and re-evaluation of patient's condition.  Khriz Liddy, NP  10/05/2024, 7:25 AM  Advanced Heart Failure Team Pager [phone] (M-F; 7a - 5p)  Please contact [facility] for night-coverage after hours (5p -7a ) and weekends on [email or web address]

## 2024-10-05 NOTE — Transfer of Care (Signed)
 Immediate Anesthesia Transfer of Care Note  Patient: [name]  Procedure(s) Performed: Transcatheter Aortic Valve Replacement, Transfemoral ECHOCARDIOGRAM, TRANSTHORACIC  Patient Location: SICU  Anesthesia Type:MAC  Level of Consciousness: drowsy  Airway & Oxygen Therapy: Patient Spontanous Breathing and Patient connected to face mask oxygen  Post-op Assessment: Report given to RN and Post -op Vital signs reviewed and stable  Post vital signs: Reviewed and stable  Last Vitals:  Vitals Value Taken Time  BP 95/47 10/05/24 15:48  Temp    Pulse 51 10/05/24 15:53  Resp 15 10/05/24 15:53  SpO2 97 % 10/05/24 15:53  Vitals shown include unfiled device data.  Last Pain:  Vitals:   10/05/24 1200  TempSrc: Core  PainSc: 0-No pain         Complications: There were no known notable events for this encounter.

## 2024-10-05 NOTE — TOC Initial Note (Addendum)
 Transition of Care [facility]) - Initial/Assessment Note    Patient Details  Name: [name] MRN: [id number] Date of Birth: 08-22-1930  [facility] of [facility]) CM/SW Contact:    Justina Delcia Czar, RN Phone Number: [phone] 10/05/2024, 6:04 PM  Clinical Narrative:      Readmission 7 day/30 day Spoke to pt and dtr at bedside. States his dtr, [name] will be with him for 2 weeks. She and SIL and other dtr will assist him at home. Offered choice for HH, medicare.gov listing placed on chart and provided to pt. Agreed on [facility] for [facility].   Pt reports he has scale at home that was provided by [facility] to monitor his weight from home. He has HF education and monitors his diet.   Dtrs will provide transportation home.   Will schedule PCP hospital follow up appt at dc.            Expected Discharge Plan: Home w Home Health Services Barriers to Discharge: Continued Medical Work up   Patient Goals and CMS Choice Patient states their goals for this hospitalization and ongoing recovery are:: wants to recover CMS Medicare.gov Compare Post Acute Care list provided to:: Patient Choice offered to / list presented to : Patient      Expected Discharge Plan and Services   Discharge Planning Services: CM Consult Post Acute Care Choice: Home Health Living arrangements for the past 2 months: Single Family Home                           HH Arranged: RN, PT          Prior Living Arrangements/Services Living arrangements for the past 2 months: Single Family Home Lives with:: Self, Pets Patient language and need for interpreter reviewed:: Yes Do you feel safe going back to the place where you live?: Yes      Need for Family Participation in Patient Care: Yes (Comment)   Current home services: DME (rolling walker, cane) Criminal Activity/Legal Involvement Pertinent to Current Situation/Hospitalization: No - Comment as needed  Activities of Daily Living       Permission Sought/Granted Permission sought to share information with : Case Manager, PCP, Family Supports Permission granted to share information with : Yes, Verbal Permission Granted  Share Information with NAME: Justinian Miano  Permission granted to share info w AGENCY: PCP, DME        Emotional Assessment Appearance:: Appears stated age, Appears younger than stated age Attitude/Demeanor/Rapport: Engaged Affect (typically observed): Accepting Orientation: : Oriented to Self, Oriented to Place, Oriented to  Time, Oriented to Situation   Psych Involvement: No (comment)  Admission diagnosis:  Acute on chronic diastolic (congestive) heart failure (HCC) [I50.33] Patient Active Problem List   Diagnosis Date Noted   Acute on chronic diastolic (congestive) heart failure (HCC) 10/04/2024   QT prolongation 09/26/2024   Hyponatremia 09/26/2024   Scaly patch rash 06/30/2023   Severe aortic stenosis 03/11/2023   Incidental durotomy 12/22/2022   Lumbar stenosis with neurogenic claudication 12/20/2022   Bilateral carotid artery disease, unspecified type 11/21/2022   Pruritic rash 12/13/2020   CKD (chronic kidney disease) stage 3, GFR 30-59 ml/min (HCC) 12/24/2019   DM (diabetes mellitus) (HCC) 12/09/2019   Hypothyroidism    CAD (coronary artery disease) 02/12/2011   Hypertension 02/12/2011   PCP:  [name], NP Pharmacy:   [facility] [address], [place] - [address] [phone]  [place] [address] Phone: [phone] Fax: [phone]  [facility] [address] [place] [address] Phone: [phone] Fax: [phone]     Social Drivers of Health (SDOH) Social History: SDOH Screenings   Food Insecurity: No Food Insecurity (09/26/2024)  Housing: Low Risk  (09/26/2024)  Transportation Needs: No Transportation Needs (09/26/2024)  Utilities: Not At Risk (09/26/2024)  Alcohol Screen: Low Risk   (12/01/2023)  Depression (PHQ2-9): Low Risk  (09/20/2024)  Financial Resource Strain: Low Risk  (12/01/2023)  Physical Activity: Sufficiently Active (12/01/2023)  Social Connections: Moderately Integrated (09/26/2024)  Stress: No Stress Concern Present (12/01/2023)  Tobacco Use: Medium Risk (10/05/2024)  Health Literacy: Adequate Health Literacy (12/01/2023)   SDOH Interventions:     Readmission Risk Interventions    09/28/2024   12:58 PM 09/08/2024   10:09 AM  Readmission Risk Prevention Plan  Transportation Screening Complete Complete  PCP or Specialist Appt within 5-7 Days Complete Complete  Home Care Screening Complete Complete  Medication Review (RN CM) Complete Complete

## 2024-10-05 NOTE — Anesthesia Postprocedure Evaluation (Signed)
 Anesthesia Post Note  Patient: [name]  Procedure(s) Performed: Transcatheter Aortic Valve Replacement, Transfemoral ECHOCARDIOGRAM, TRANSTHORACIC     Patient location during evaluation: ICU Anesthesia Type: MAC Level of consciousness: oriented, patient cooperative and sedated Pain management: pain level controlled Vital Signs Assessment: post-procedure vital signs reviewed and stable Respiratory status: spontaneous breathing, nonlabored ventilation, patient connected to nasal cannula oxygen and respiratory function stable Cardiovascular status: stable (treating BP with Norepi and Albumin) Anesthetic complications: no   There were no known notable events for this encounter.  Last Vitals:  Vitals:   10/05/24 1600 10/05/24 1615  BP: (!) 88/47 (!) 97/50  Pulse: (!) 51 (!) 50  Resp: 19 15  Temp:    SpO2: 96% 97%    Last Pain:  Vitals:   10/05/24 1200  TempSrc: Core  PainSc: 0-No pain                 Mingo Siegert,E. Dane Kopke

## 2024-10-05 NOTE — Progress Notes (Signed)
  HEART AND VASCULAR CENTER   MULTIDISCIPLINARY HEART VALVE TEAM  Patient doing well s/p TAVR. He is hemodynamically stable but BP soft. Treated with bottle of albumin and 2 mcg of levofed. Groin sites stable, left groin with mild ooze. Madison RN holding pressure. No hematoma. ECG with sinus brady with HRs in 50s and no high grade block. Still with swan in place. CHF to see tomorrow AM. Needs dose of Aspirin /Plavix  today. Early ambulation after bedrest completed and hopeful discharge over the next 24-48 hours.   Lamarr Hummer PA-C  [facility]  Pager [phone]

## 2024-10-05 NOTE — Discharge Summary (Incomplete)
 HEART AND VASCULAR CENTER   MULTIDISCIPLINARY HEART VALVE TEAM  Discharge Summary    Patient ID: [name] MRN: [id number]; DOB: 03/03/1930  Admit date: 10/04/2024 Discharge date: 10/06/2024  PCP:  [name], NP  [facility] Cardiologist:  [name], MD  [facility] Structural heart: [name], MD [facility] Electrophysiologist:  None   Discharge Diagnoses    Principal Problem:   S/P TAVR (transcatheter aortic valve replacement) Active Problems:   CAD (coronary artery disease)   Hypertension   Hypothyroidism   DM (diabetes mellitus) (HCC)   CKD (chronic kidney disease) stage 3, GFR 30-59 ml/min (HCC)   Bilateral carotid artery disease, unspecified type   Severe aortic stenosis   Acute on chronic diastolic (congestive) heart failure (HCC)   Allergies No Known Allergies  Diagnostic Studies/Procedures    TAVR OPERATIVE NOTE     Date of Procedure:                10/05/2024   Preoperative Diagnosis:      Severe Aortic Stenosis    Postoperative Diagnosis:    Same    Procedure:        Transcatheter Aortic Valve Replacement - Percutaneous Transfemoral Approach             Edwards Sapien 3 Ultra Resilia THV (size 23 mm, serial # [id number] )              Co-Surgeons:                        Con Clunes, MD and [name], MD   Anesthesiologist:                  FORBES Kelly Mace, MD   Echocardiographer:              Maude Emmer, MD   Pre-operative Echo Findings: Severe aortic stenosis Severe left ventricular systolic dysfunction, LVEF 30-35%   Post-operative Echo Findings: No paravalvular leak Moderately severe left ventricular systolic function (slight improvement)  _____________    Echo 10/06/24:  IMPRESSIONS  1. Left ventricular ejection fraction, by estimation, is 35 to 40%. Left  ventricular ejection fraction by 2D MOD biplane is 39.3 %. The left  ventricle has moderately decreased function. The left ventricle   demonstrates regional wall motion abnormalities  (see scoring diagram/findings for description). Left ventricular diastolic  parameters are consistent with Grade II diastolic dysfunction  (pseudonormalization).   2. Right ventricular systolic function is normal. The right ventricular  size is normal. There is normal pulmonary artery systolic pressure.   3. Left atrial size was mildly dilated.   4. Lead/Catheter in RA.   5. The mitral valve is normal in structure. No evidence of mitral valve  regurgitation.   6. Well seated but leaflet not well visualized with normal function (PV 2  m/sec, MG 8 mmHg) and no evidence of translvalvular or paravalvular  regurgitation. The aortic valve has been repaired/replaced. Aortic valve  regurgitation is not visualized. There   is a 23 mm Edwards Sapien prosthetic (TAVR) valve present in the aortic  position. Procedure Date: 10/05/24.   Comparison(s): No significant change from prior study. EF seems to be  worse and there is now TAVR.   History of Present Illness     [name] is a 89 y.o. male with a history of CAD (s/p previous PCI to RCA with 80-90% ISR at the distal margin of proximal/mid RCA  stent, s/p PCI LM/LAD w/ IABP on 09/15/24, consider staged PCI RCA), DMT2, HFrEF, RBBB, HTN, HLD, hypothyroidism, mod-severe MR and severe AS who presented to [facility] on 10/05/24 for planned TAVR.   In 05/2024, he was seen by Dr. Verlin for evaluation of asymptomatic severe aortic stenosis and was felt he could be a good candidate for TAVR at that time.  He was then admitted 06/2024 with chest pain and in early 09/2024 for acute CHF. Echo 09/10/24 showed EF 40% and severe LFLG AS with mean grad 35.0 mmHg, Vmax 3.98 m/s, AVA 0.86 cm2, DVI 0.27, SVI 39, mod-sev MR, and mild AI. [facility] 09/09/2024 showed severe multivessel coronary artery disease w/ 80-90% ostial LAD stenosis, CTO of the ramus intermedius, and 80-90% in-stent restenosis at the distal margin of  proximal/mid RCA stent. S/p PCI LM/LAD w/ IABP on 09/15/24. Readmitted 11/23-11/25 with recurrent CHF.  RHC 11/25 showed normal filling pressures with low cardiac output in setting of significant AS (output drops while in bigeminy). He was started on amiodarone  and increased Lasix . Plans were made for discharge home with admission 10/04/24 for RHC with Swan placement and tune up prior to TAVR, scheduled for 10/05/24.  Hospital Course     Consultants: CHF    Acute on chronic HFrEF: -- 12/1 RHC: RA6, PA 49/20 (32), PCW 23, CO/CI (TD) 3.19/1.72 -- Treated with IV lasix  40mg  x2 with 1.6L output.  -- CVP 6-8 today. LVEDP 21 mm hg at the time of TAVR. -- EF 35-40% on echo today. -- Resume home Lasix  40mg  daily/KCl 20 meq daily.   -- Will start Losartan  25 mg daily. Avoid BB given low output and increased risk for HAVB. -- BMET at follow up.   Severe AS:  -- S/p TAVR with a 23 mm Edwards Sapien 3 Ultra Resilia THV via the TF approach on 10/05/24.  -- Post operative echo showed EF 35-40%, G2DD, normally functioning TAVR with a mean gradient of 8 mmHg and no PVL -- Groin sites are stable.  -- Continue Asprin 81mg  daily and Plavix  75mg  daily given recent PCI. -- Met with cardiac rehab to discuss CRP phase II.  -- Plan for discharge home today with close follow up in the outpatient setting.  -- Discharge with HHPT/RN.   PVCs:  -- CO noted to drop with PVCs on RHC and started on amio.  -- Continued on amiodarone  200mg  BID.  -- Drop amiodarone  to 200mg  daily at discharge.    CAD:  -- s/p previous PCI to RCA with 80-90% ISR at the distal margin of proximal/mid RCA stent, s/p PCI LM/LAD w/ IABP on 09/15/24. -- Plan for staged intervention for RCA after TAVR only if he has angina.  -- Continue DAPT with aspirin  81mg  daily and Plavix  75mg  daily. -- Continue Liptor 40mg  daily.    RBBB:  -- Has not had any issues with HAVB.  -- Will DC home with Zio AT to rule out late presenting HAVB.   Mod-severe  MR: -- MR not reported on echo read today but looks like mild MR on personal review, much improved from pre TAVR.  CKD stage IIIb: -- Creat improved from 1.8--> 1.59. -- Start losartan  25mg  daily.  -- BMET next week.    _____________  Discharge Vitals Blood pressure 137/61, pulse 71, temperature 98.6 F (37 C), temperature source Core, resp. rate (!) 25, height 5' 9 (1.753 m), weight 65.5 kg, SpO2 100%.  Filed Weights   10/04/24 1100 10/05/24 0500 10/06/24 0640  Weight: 67.7 kg 66.4 kg 65.5 kg     GEN: Well nourished, well developed in no acute distress NECK: No JVD CARDIAC: RRR, no murmurs, rubs, gallops RESPIRATORY:  Clear to auscultation without rales, wheezing or rhonchi  ABDOMEN: Soft, non-tender, non-distended EXTREMITIES:  No edema; No deformity.  Groin sites clear without hematoma or ecchymosis.    Disposition   Pt is being discharged home today in good condition.  Follow-up Plans & Appointments     Contact information for follow-up providers     Wonda Sharper, MD. Go on 10/14/2024.   Specialty: Cardiology Why: @ 11:50pm, please arrive at least 20 minutes early. Contact information: [address] [place] [address] [phone]         [name], NP. Go in 2 day(s).   Specialty: Internal Medicine Why: Hospital follow up appointment scheduled for Friday, October 08, 2024 at 11:00 AM.  PLEASE ARRIVE 10-15 minutes early. PLEASE call to cancel/reschedule if you CANNOT make appointment. Contact information: [address] [place] [address] [phone]              Contact information for after-discharge care     Home Medical Care     [facility]) Follow up.   Service: Home Health Services Why: Home Health RN and Physical Therapy-agency will call to arrange appt Contact information: [address] [place] [address] [facility] [place]  [address] [phone]                     Discharge Instructions     Amb Referral to Cardiac Rehabilitation   Complete by: As directed    Diagnosis:  Valve Replacement Coronary Stents     Valve: Aortic Comment - tavr   After initial evaluation and assessments completed: Virtual Based Care may be provided alone or in conjunction with Phase 2 Cardiac Rehab based on patient barriers.: Yes   Intensive Cardiac Rehabilitation (ICR) [facility] location only OR Traditional Cardiac Rehabilitation (TCR) *If criteria for ICR are not met will enroll in TCR [facility] only): Yes       Discharge Medications   Allergies as of 10/06/2024   No Known Allergies      Medication List     TAKE these medications    acetaminophen  500 MG tablet Commonly known as: TYLENOL  Take 1,000 mg by mouth every 6 (six) hours as needed for mild pain (pain score 1-3) or headache.   amiodarone  200 MG tablet Commonly known as: PACERONE  Take 1 tablet (200 mg total) by mouth daily. What changed: when to take this   aspirin  EC 81 MG tablet Take 1 tablet (81 mg total) by mouth daily. Swallow whole.   atorvastatin  40 MG tablet Commonly known as: LIPITOR Take 1 tablet (40 mg total) by mouth daily.   Centrum Silver Men 50+ Tabs Take 1 tablet by mouth in the morning.   clopidogrel  75 MG tablet Commonly known as: PLAVIX  Take 1 tablet (75 mg total) by mouth daily.   fish oil-omega-3 fatty acids  1000 MG capsule Take 1 g by mouth daily.   furosemide  40 MG tablet Commonly known as: LASIX  Take 1 tablet (40 mg total) by mouth daily.   levothyroxine  88 MCG tablet Commonly known as: SYNTHROID  Take 1 tablet (88 mcg total) by mouth daily before breakfast.   losartan  25 MG tablet Commonly known as: Cozaar  Take 1 tablet (25 mg total) by mouth daily.   potassium chloride  SA 20  MEQ tablet Commonly known as: KLOR-CON  M Take 1 tablet (20 mEq total) by mouth daily.          Outstanding Labs/Studies   BMET  ______________________  Duration  of Discharge Encounter: APP Time: 25 minutes    Signed, Lamarr Hummer, PA-C 10/06/2024, 1:49 PM [phone]  Patient seen, examined. Available data reviewed. Agree with findings, assessment, and plan as outlined by Izetta Hummer, PA-C.  Patient is independently interviewed and examined.  He is an alert, elderly, oriented male in no distress.  HEENT is normal, JVP is normal, lungs are clear bilaterally, heart is regular rate and rhythm with a 1/6 ejection murmur at the right upper sternal border, abdomen is soft and nontender, extremities have no edema, bilateral groin sites are clear with no hematoma or ecchymosis.  Review of the patient's telemetry shows normal sinus rhythm with no significant arrhythmia.  His postoperative echo shows normal function of his TAVR prosthesis with a mean gradient less than 10 mmHg and no paravalvular regurgitation.  His mitral regurgitation is now only mild and significantly improved from baseline.  The patient feels great and I think he is medically stable for discharge today.  I reviewed his medication program with him and it will be continued at discharge.  His LVEF is now 35 to 40%, also improved from baseline.  MD time spent conducting this discharge is 30 minutes and includes my personal exam of the patient, review of his echo and lab work, personal review of his telemetry, counseling about discharge instructions and post TAVR restrictions with the patient and his family members, and coordination of follow-up care.  [name], M.D. 10/06/2024 5:25 PM

## 2024-10-05 NOTE — Op Note (Signed)
 [facility]ART [facility] VASC[facility]ENTER   MULTIDISCIPLINARY HEART VALVE TEAM     TAVR OPERATIVE NOTE     Date of Procedure:                10/05/2024   Preoperative Diagnosis:      Severe Aortic Stenosis    Postoperative Diagnosis:    Same    Procedure:        Transcatheter Aortic Valve Replacement - Percutaneous Transfemoral Approach             Edwards Sapien 3 Ultra Resilia THV (size 23 mm, serial # [id number] )              Co-Surgeons:                        Con Clunes, MD and [name], MD   Anesthesiologist:                  FORBES Kelly Mace, MD   Echocardiographer:              Maude Emmer, MD   Pre-operative Echo Findings: Severe aortic stenosis Severe left ventricular systolic dysfunction, LVEF 30-35%   Post-operative Echo Findings: No paravalvular leak Moderately severe left ventricular systolic function (slight improvement)   BRIEF CLINICAL NOTE AND INDICATIONS FOR SURGERY   89 year old highly functional gentleman who has developed progressive and very severe aortic stenosis with recent heart failure hospitalization in the setting of new left ventricular dysfunction.  He was found to have concomitant coronary disease and after multidisciplinary discussion between interventional cardiology and cardiac surgery, we elected to proceed with PCI of severe stenosis of the ostium of the LAD.  His residual CAD, primarily involving the RCA, will be managed medically initially.  He presents today for TAVR via transfemoral approach to address his severe aortic stenosis.  He was hospitalized yesterday electively for right heart catheterization/Swan-Ganz catheter placement and now presents today after optimization by the heart failure team.   DETAILS OF THE OPERATIVE PROCEDURE   PREPARATION:   The patient is brought to the operating room on the above mentioned date and central monitoring was established by the anesthesia team including placement of a radial arterial line. The patient is  placed in the supine position on the operating table.  Intravenous antibiotics are administered. The patient is monitored closely throughout the procedure under conscious sedation.     Baseline transthoracic echocardiogram is performed. The patient's chest, abdomen, both groins, and both lower extremities are prepared and draped in a sterile manner. A time out procedure is performed.     PERIPHERAL ACCESS:   Using ultrasound guidance, femoral arterial and venous access is obtained with placement of 6 Fr sheaths on the left side.  US  images are digitally captured and stored in the patient's chart. A pigtail diagnostic catheter was passed through the femoral arterial sheath under fluoroscopic guidance into the aortic root.  A temporary transvenous pacemaker catheter was passed through the femoral venous sheath under fluoroscopic guidance into the right ventricle.  The pacemaker was tested to ensure stable lead placement and pacemaker capture. Aortic root angiography was performed in order to determine the optimal angiographic angle for valve deployment.   TRANSFEMORAL ACCESS:  A micropuncture technique is used to access the right femoral artery under fluoroscopic and ultrasound guidance.  2 Perclose devices are deployed at 10' and 2' positions to 'PreClose' the femoral artery. An 8 French sheath is placed and then  an Amplatz Superstiff wire is advanced through the sheath. This is changed out for a 14 French transfemoral E-Sheath after progressively dilating over the Superstiff wire.  A AL-2 catheter was used to direct a straight-tip exchange length wire across the native aortic valve into the left ventricle. This was exchanged out for a pigtail catheter and position was confirmed in the LV apex. Simultaneous LV and Ao pressures were recorded.  The pigtail catheter was exchanged for a Safari wire in the LV apex.     BALLOON AORTIC VALVULOPLASTY:  Not performed   TRANSCATHETER HEART VALVE DEPLOYMENT:  An  Edwards Sapien 3 Ultra Resilia transcatheter heart valve (size 23 mm) was prepared and crimped per manufacturer's guidelines, and the proper orientation of the valve is confirmed on the [employer] delivery system. The valve was advanced through the introducer sheath using normal technique until in an appropriate position in the abdominal aorta beyond the sheath tip. The balloon was then retracted and using the fine-tuning wheel was centered on the valve. The valve was then advanced across the aortic arch using appropriate flexion of the catheter. The valve was carefully positioned across the aortic valve annulus. The Commander catheter was retracted using normal technique. Once final position of the valve has been confirmed by angiographic assessment, the valve is deployed while temporarily holding ventilation and during rapid ventricular pacing to maintain systolic blood pressure < 50 mmHg and pulse pressure < 10 mmHg. The balloon inflation is held for >3 seconds after reaching full deployment volume. Once the balloon has fully deflated the balloon is retracted into the ascending aorta and valve function is assessed using echocardiography. The patient's hemodynamic recovery following valve deployment is good.  The deployment balloon and guidewire are both removed. Echo demostrated acceptable post-procedural gradients, stable mitral valve function, and no aortic insufficiency.      PROCEDURE COMPLETION:  The sheath was removed and femoral artery closure is performed using the 2 previously deployed Perclose devices.  Protamine is administered once femoral arterial repair was complete. The site is clear with no evidence of bleeding or hematoma after the sutures are tightened. The temporary pacemaker and pigtail catheters are removed. Mynx closure is used for contralateral femoral arterial hemostasis for the 6 Fr sheath.   The patient tolerated the procedure well and is transported to the recovery area in  stable condition. There were no immediate intraoperative complications. All sponge instrument and needle counts are verified correct at completion of the operation.    The patient received a total of 60 mL of intravenous contrast during the procedure.   EBL: minimal   LVEDP: 21 mmHg  Con Clunes, MD Cardiothoracic Surgery Pager: [phone]

## 2024-10-05 NOTE — Interval H&P Note (Signed)
 History and Physical Interval Note:  10/05/2024 2:17 PM  [name]  has presented today for surgery, with the diagnosis of Severe Aortic Stenosis.  The various methods of treatment have been discussed with the patient and family. After consideration of risks, benefits and other options for treatment, the patient has consented to  Procedure(s): Transcatheter Aortic Valve Replacement, Transfemoral (N/A) ECHOCARDIOGRAM, TRANSTHORACIC (N/A) as a surgical intervention.  The patient's history has been reviewed, patient examined, no change in status, stable for surgery.  I have reviewed the patient's chart and labs.  Questions were answered to the patient's satisfaction.     [name]

## 2024-10-05 NOTE — Anesthesia Preprocedure Evaluation (Addendum)
 Anesthesia Evaluation  Patient identified by MRN, date of birth, ID band Patient awake    Reviewed: Allergy & Precautions, NPO status , Patient's Chart, lab work & pertinent test results  History of Anesthesia Complications Negative for: history of anesthetic complications  Airway Mallampati: II  TM Distance: >3 FB Neck ROM: Full    Dental  (+) Edentulous Upper, Edentulous Lower   Pulmonary shortness of breath (recent CHF), former smoker   breath sounds clear to auscultation       Cardiovascular hypertension, Pt. on medications (-) angina + CAD, + Cardiac Stents and +CHF  + dysrhythmias Atrial Fibrillation  Rhythm:Regular Rate:Normal + Systolic murmurs 87/06/7973 RHC: 1. Right heart catheterization with leave in swan for optimization prior to TAVr 2. Normal right, moderately elevated left heart filling pressures with significant v waves 3. Moderately reduced cardiac index by TD, improved ectopy from prior RHC 4. 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 1. LV EF 46.1 %, mildly decreased function. The left ventricle demonstrates  regional wall motion abnormalities (see scoring diagram/findings for description).    2. RVF is normal. The right ventricular size is normal. There is normal pulmonary artery systolic pressure.   3. Left atrial size was severely dilated.   4. The mitral valve is degenerative. Moderate to severe mitral valve regurgitation.   5. The aortic valve is calcified. Aortic valve regurgitation is mild. Severe aortic valve stenosis. Aortic valve area, by VTI measures 0.86 cm. Aortic valve mean gradient measures 35.0 mmHg. Aortic valve Vmax measures  3.98 m/s. Aortic valve acceleration time measures 127 msec.      Neuro/Psych negative neurological ROS     GI/Hepatic negative GI ROS, Neg liver ROS,,,  Endo/Other  diabetes (glu 90)Hypothyroidism    Renal/GU Renal InsufficiencyRenal disease      Musculoskeletal   Abdominal   Peds  Hematology Plavix  Hb 13.5, plt 272k   Anesthesia Other Findings   Reproductive/Obstetrics                              Anesthesia Physical Anesthesia Plan  ASA: 4  Anesthesia Plan: MAC   Post-op Pain Management: Tylenol  PO (pre-op)*   Induction:   PONV Risk Score and Plan: 1 and Treatment may vary due to age or medical condition  Airway Management Planned: Natural Airway and Simple Face Mask  Additional Equipment: Arterial line and PA Cath  Intra-op Plan:   Post-operative Plan:   Informed Consent: I have reviewed the patients History and Physical, chart, labs and discussed the procedure including the risks, benefits and alternatives for the proposed anesthesia with the patient or authorized representative who has indicated his/her understanding and acceptance.     Consent reviewed with POA  Plan Discussed with: CRNA and Surgeon  Anesthesia Plan Comments: (Discussed with pt and family Plan MAC with GETA immed available)         Anesthesia Quick Evaluation

## 2024-10-05 NOTE — Discharge Instructions (Signed)

## 2024-10-05 NOTE — Op Note (Signed)
 [facility]   MULTIDISCIPLINARY HEART VALVE TEAM   TAVR OPERATIVE NOTE   Date of Procedure:  10/05/2024  Preoperative Diagnosis: Severe Aortic Stenosis   Postoperative Diagnosis: Same   Procedure:   Transcatheter Aortic Valve Replacement - Percutaneous Transfemoral Approach  Edwards Sapien 3 Ultra Resilia THV (size 23 mm, serial # [id number] )   Co-Surgeons:  Con Clunes, MD and [name], MD  Anesthesiologist:  FORBES Kelly Mace, MD  Echocardiographer:  Maude Emmer, MD  Pre-operative Echo Findings: Severe aortic stenosis Severe left ventricular systolic dysfunction, LVEF 30-35%  Post-operative Echo Findings: No paravalvular leak Moderately severe left ventricular systolic function (slight improvement)  BRIEF CLINICAL NOTE AND INDICATIONS FOR SURGERY  89 year old highly functional gentleman who has developed progressive and very severe aortic stenosis with recent heart failure hospitalization in the setting of new left ventricular dysfunction.  He was found to have concomitant coronary disease and after multidisciplinary discussion between interventional cardiology and cardiac surgery, we elected to proceed with PCI of severe stenosis of the ostium of the LAD.  His residual CAD, primarily involving the RCA, will be managed medically initially.  He presents today for TAVR via transfemoral approach to address his severe aortic stenosis.  He was hospitalized yesterday electively for right heart catheterization/Swan-Ganz catheter placement and now presents today after optimization by the heart failure team.  During the course of the patient's preoperative work up they have been evaluated comprehensively by a multidisciplinary team of specialists coordinated through the Multidisciplinary Heart Valve Clinic in the [facility].  They have been demonstrated to suffer from symptomatic severe aortic stenosis as noted above. The patient has been  counseled extensively as to the relative risks and benefits of all options for the treatment of severe aortic stenosis including long term medical therapy, conventional surgery for aortic valve replacement, and transcatheter aortic valve replacement.  The patient has been independently evaluated in formal cardiac surgical consultation by Dr Clunes, who deemed the patient appropriate for TAVR. Based upon review of all of the patient's preoperative diagnostic tests they are felt to be candidate for transcatheter aortic valve replacement using the transfemoral approach as an alternative to conventional surgery.    Following the decision to proceed with transcatheter aortic valve replacement, a discussion has been held regarding what types of management strategies would be attempted intraoperatively in the event of life-threatening complications, including whether or not the patient would be considered a candidate for the use of cardiopulmonary bypass and/or conversion to open sternotomy for attempted surgical intervention.  The patient has been advised of a variety of complications that might develop peculiar to this approach including but not limited to risks of death, stroke, paravalvular leak, aortic dissection or other major vascular complications, aortic annulus rupture, device embolization, cardiac rupture or perforation, acute myocardial infarction, arrhythmia, heart block or bradycardia requiring permanent pacemaker placement, congestive heart failure, respiratory failure, renal failure, pneumonia, infection, other late complications related to structural valve deterioration or migration, or other complications that might ultimately cause a temporary or permanent loss of functional independence or other long term morbidity.  The patient provides full informed consent for the procedure as described and all questions were answered preoperatively.  DETAILS OF THE OPERATIVE PROCEDURE  PREPARATION:   The patient  is brought to the operating room on the above mentioned date and central monitoring was established by the anesthesia team including placement of a radial arterial line. The patient is placed in the supine position  on the operating table.  Intravenous antibiotics are administered. The patient is monitored closely throughout the procedure under conscious sedation.    Baseline transthoracic echocardiogram is performed. The patient's chest, abdomen, both groins, and both lower extremities are prepared and draped in a sterile manner. A time out procedure is performed.   PERIPHERAL ACCESS:   Using ultrasound guidance, femoral arterial and venous access is obtained with placement of 6 Fr sheaths on the left side.  US  images are digitally captured and stored in the patient's chart. A pigtail diagnostic catheter was passed through the femoral arterial sheath under fluoroscopic guidance into the aortic root.  A temporary transvenous pacemaker catheter was passed through the femoral venous sheath under fluoroscopic guidance into the right ventricle.  The pacemaker was tested to ensure stable lead placement and pacemaker capture. Aortic root angiography was performed in order to determine the optimal angiographic angle for valve deployment.  TRANSFEMORAL ACCESS:  A micropuncture technique is used to access the right femoral artery under fluoroscopic and ultrasound guidance.  2 Perclose devices are deployed at 10' and 2' positions to 'PreClose' the femoral artery. An 8 French sheath is placed and then an Amplatz Superstiff wire is advanced through the sheath. This is changed out for a 14 French transfemoral E-Sheath after progressively dilating over the Superstiff wire.  A AL-2 catheter was used to direct a straight-tip exchange length wire across the native aortic valve into the left ventricle. This was exchanged out for a pigtail catheter and position was confirmed in the LV apex. Simultaneous LV and Ao pressures  were recorded.  The pigtail catheter was exchanged for a Safari wire in the LV apex.    BALLOON AORTIC VALVULOPLASTY:  Not performed  TRANSCATHETER HEART VALVE DEPLOYMENT:  An Edwards Sapien 3 Ultra Resilia transcatheter heart valve (size 23 mm) was prepared and crimped per manufacturer's guidelines, and the proper orientation of the valve is confirmed on the [employer] delivery system. The valve was advanced through the introducer sheath using normal technique until in an appropriate position in the abdominal aorta beyond the sheath tip. The balloon was then retracted and using the fine-tuning wheel was centered on the valve. The valve was then advanced across the aortic arch using appropriate flexion of the catheter. The valve was carefully positioned across the aortic valve annulus. The Commander catheter was retracted using normal technique. Once final position of the valve has been confirmed by angiographic assessment, the valve is deployed while temporarily holding ventilation and during rapid ventricular pacing to maintain systolic blood pressure < 50 mmHg and pulse pressure < 10 mmHg. The balloon inflation is held for >3 seconds after reaching full deployment volume. Once the balloon has fully deflated the balloon is retracted into the ascending aorta and valve function is assessed using echocardiography. The patient's hemodynamic recovery following valve deployment is good.  The deployment balloon and guidewire are both removed. Echo demostrated acceptable post-procedural gradients, stable mitral valve function, and no aortic insufficiency.    PROCEDURE COMPLETION:  The sheath was removed and femoral artery closure is performed using the 2 previously deployed Perclose devices.  Protamine is administered once femoral arterial repair was complete. The site is clear with no evidence of bleeding or hematoma after the sutures are tightened. The temporary pacemaker and pigtail catheters are  removed. Mynx closure is used for contralateral femoral arterial hemostasis for the 6 Fr sheath.  The patient tolerated the procedure well and is transported to the recovery area in stable  condition. There were no immediate intraoperative complications. All sponge instrument and needle counts are verified correct at completion of the operation.   The patient received a total of 60 mL of intravenous contrast during the procedure.  EBL: minimal  LVEDP: 21 mmHg   [name], MD 10/05/2024 4:14 PM

## 2024-10-06 ENCOUNTER — Encounter (HOSPITAL_COMMUNITY): Payer: Self-pay | Admitting: Cardiovascular Disease

## 2024-10-06 ENCOUNTER — Inpatient Hospital Stay (HOSPITAL_COMMUNITY)

## 2024-10-06 ENCOUNTER — Inpatient Hospital Stay (HOSPITAL_BASED_OUTPATIENT_CLINIC_OR_DEPARTMENT_OTHER)
Admission: RE | Admit: 2024-10-06 | Discharge: 2024-10-06 | Disposition: A | Discharge status: Home / Self Care | Source: Ambulatory Visit | Attending: Physician Assistant | Admitting: Physician Assistant

## 2024-10-06 ENCOUNTER — Other Ambulatory Visit: Payer: Self-pay

## 2024-10-06 DIAGNOSIS — Z952 Presence of prosthetic heart valve: Secondary | ICD-10-CM

## 2024-10-06 DIAGNOSIS — I4510 Unspecified right bundle-branch block: Secondary | ICD-10-CM

## 2024-10-06 LAB — BASIC METABOLIC PANEL WITH GFR
Anion gap: 9 (ref 5–15)
BUN: 23 mg/dL (ref 8–23)
CO2: 22 mmol/L (ref 22–32)
Calcium: 8.8 mg/dL — ABNORMAL LOW (ref 8.9–10.3)
Chloride: 103 mmol/L (ref 98–111)
Creatinine, Ser: 1.59 mg/dL — ABNORMAL HIGH (ref 0.61–1.24)
GFR, Estimated: 41 mL/min — ABNORMAL LOW (ref >60)
Glucose, Bld: 108 mg/dL — ABNORMAL HIGH (ref 70–99)
Potassium: 4.1 mmol/L (ref 3.5–5.1)
Sodium: 134 mmol/L — ABNORMAL LOW (ref 135–145)

## 2024-10-06 LAB — HEMOGLOBIN A1C
Hgb A1c MFr Bld: 5.9 % — ABNORMAL HIGH (ref 4.8–5.6)
Mean Plasma Glucose: 123 mg/dL

## 2024-10-06 LAB — ECHOCARDIOGRAM COMPLETE
AR max vel: 2.03 cm2
AV Area VTI: 2.07 cm2
AV Area mean vel: 1.95 cm2
AV Mean grad: 8 mmHg
AV Peak grad: 16 mmHg
Ao pk vel: 2 m/s
Area-P 1/2: 4.21 cm2
Calc EF: 39.3 %
Height: 69 in
MV M vel: 4.12 m/s
MV Peak grad: 67.9 mmHg
MV VTI: 2.37 cm2
S' Lateral: 4.9 cm
Single Plane A2C EF: 37.8 %
Single Plane A4C EF: 37.9 %
Weight: 2310.4 [oz_av]

## 2024-10-06 LAB — CBC
HCT: 38.3 % — ABNORMAL LOW (ref 39.0–52.0)
Hemoglobin: 13 g/dL (ref 13.0–17.0)
MCH: 31.3 pg (ref 26.0–34.0)
MCHC: 33.9 g/dL (ref 30.0–36.0)
MCV: 92.3 fL (ref 80.0–100.0)
Platelets: 224 K/uL (ref 150–400)
RBC: 4.15 MIL/uL — ABNORMAL LOW (ref 4.22–5.81)
RDW: 12.8 % (ref 11.5–15.5)
WBC: 11.7 K/uL — ABNORMAL HIGH (ref 4.0–10.5)
nRBC: 0 % (ref 0.0–0.2)

## 2024-10-06 LAB — MAGNESIUM: Magnesium: 1.9 mg/dL (ref 1.7–2.4)

## 2024-10-06 MED ORDER — AMIODARONE HCL 200 MG PO TABS: 200.0000 mg | ORAL_TABLET | Freq: Every day | ORAL | 3 refills | Status: AC

## 2024-10-06 MED ORDER — LOSARTAN POTASSIUM 25 MG PO TABS: 25.0000 mg | ORAL_TABLET | Freq: Every day | ORAL | 11 refills | Status: AC

## 2024-10-06 NOTE — TOC Transition Note (Signed)
 Transition of Care [facility]) - Discharge Note   Patient Details  Name: [name] MRN: [id number] Date of Birth: 18-Oct-1930  Transition of [facility]) CM/SW Contact:  Arlana JINNY Nicholaus [place] Phone Number: [phone] 10/06/2024, 1:40 PM   Clinical Narrative:   Hospital follow up appointment scheduled for Friday, October 08, 2024 at 11:00 AM.       Barriers to Discharge: Continued Medical Work up   Patient Goals and CMS Choice Patient states their goals for this hospitalization and ongoing recovery are:: wants to recover CMS Medicare.gov Compare Post Acute Care list provided to:: Patient Choice offered to / list presented to : Patient      Discharge Placement                       Discharge Plan and Services Additional resources added to the After Visit Summary for     Discharge Planning Services: CM Consult Post Acute Care Choice: Home Health                    HH Arranged: RN, PT          Social Drivers of Health (SDOH) Interventions SDOH Screenings   Food Insecurity: No Food Insecurity (10/05/2024)  Housing: Low Risk  (10/05/2024)  Transportation Needs: No Transportation Needs (10/05/2024)  Utilities: Not At Risk (10/05/2024)  Alcohol Screen: Low Risk  (12/01/2023)  Depression (PHQ2-9): Low Risk  (09/20/2024)  Financial Resource Strain: Low Risk  (12/01/2023)  Physical Activity: Sufficiently Active (12/01/2023)  Social Connections: Moderately Integrated (10/05/2024)  Stress: No Stress Concern Present (12/01/2023)  Tobacco Use: Medium Risk (10/05/2024)  Health Literacy: Adequate Health Literacy (12/01/2023)     Readmission Risk Interventions    09/28/2024   12:58 PM 09/08/2024   10:09 AM  Readmission Risk Prevention Plan  Transportation Screening Complete Complete  PCP or Specialist Appt within 5-7 Days Complete Complete  Home Care Screening Complete Complete  Medication Review (RN CM) Complete Complete

## 2024-10-06 NOTE — Progress Notes (Signed)
  Echocardiogram 2D Echocardiogram has been performed.  [name] 10/06/2024, 8:42 AM

## 2024-10-06 NOTE — Progress Notes (Signed)
 Discussed with pt restrictions, exercise guidelines, and CRPII. Pt receptive. Will refer to [facility] CRPII.  [id number] Aliene Aris BS, ACSM-CEP 10/06/2024 12:27 PM

## 2024-10-06 NOTE — Plan of Care (Signed)
  Problem: Activity: Goal: Ability to return to baseline activity level will improve Outcome: Progressing   Problem: Cardiovascular: Goal: Ability to achieve and maintain adequate cardiovascular perfusion will improve Outcome: Progressing Goal: Vascular access site(s) Level 0-1 will be maintained Outcome: Progressing   Problem: Health Behavior/Discharge Planning: Goal: Ability to safely manage health-related needs after discharge will improve Outcome: Progressing   Problem: Education: Goal: Knowledge of General Education information will improve Description: Including pain rating scale, medication(s)/side effects and non-pharmacologic comfort measures Outcome: Progressing   Problem: Clinical Measurements: Goal: Ability to maintain clinical measurements within normal limits will improve Outcome: Progressing

## 2024-10-07 ENCOUNTER — Telehealth: Payer: Self-pay

## 2024-10-07 NOTE — Transitions of Care (Post Inpatient/ED Visit) (Signed)
   10/07/2024  Name: Lorrie Strauch MRN: [id number] DOB: [birth year]  Today's TOC FU Call Status: Today's TOC FU Call Status:: Unsuccessful Call (1st Attempt) Unsuccessful Call (1st Attempt) Date: 10/07/24  Attempted to reach the patient regarding the most recent Inpatient/ED visit.  Patient request call back late afternoon, HIPAA verified. Will attempt again today, if possible.  Follow Up Plan: Additional outreach attempts will be made to reach the patient to complete the Transitions of Care (Post Inpatient/ED visit) call.    Richerd Fish, RN, BSN, CCM [facility], [facility] Management Coordinator Direct Dial: [phone]

## 2024-10-07 NOTE — Patient Instructions (Addendum)
 Visit Information  Thank you for taking time to visit with me today. Please don't hesitate to contact me if I can be of assistance to you before our next scheduled telephone appointment.  Our next appointment is by telephone on 09/12/24  at 1300  Following is a copy of your care plan:   Goals Addressed             This Visit's Progress    [facility] Transitions of Care (TOC) Care Plan       Problems:  Recent Hospitalization for treatment of CHF and s/p TAVR - Zio patch Heart Failure SPECIAL INSTRUCTIONS AVOID STRAINING STOP ANY ACTIVITY THAT CAUSES CHEST PAIN, SHORTNESS OF BREATH, DIZZINESS, SWEATING, OR EXCESSIVE WEAKNESS. SPECIAL INSTRUCTIONS FOR PATIENTS WITH HEART FAILURE: Continue to follow the instructions in your Heart Failure Patient Education information that you received during your hospital stay. Record daily weight on same scale at same time of day. If your doctor did not discuss your diet or activity in the information above, please follow a Heart Healthy Low Sodium diet and increase your activity as you feel able. Call your doctor: (Anytime you feel any of the following symptoms) 3-4 pound weight gain in 1-2 days or 2 pounds overnight Shortness of breath, with or without a dry hacking cough Swelling in the hands, feet or stomach If you have to sleep on extra pillows at night in order to breathe Patient with TAVR and Zio Patch encouraged to call Dr. Wonda for any questions, patient denies any symptoms states he feels much better since procedure. Patient encouraged to discuss sacral wound noted on photos, to address treatment options as well as HH RN  Goal:  Over the next 30 days, the patient will not experience hospital readmission  Interventions:  Transitions of Care: Doctor Visits  - discussed the importance of doctor visits  Heart Failure Interventions: Provided education on low sodium diet Advised patient to weigh each morning after emptying bladder Discussed importance of  daily weight and advised patient to weigh and record daily Reviewed role of diuretics in prevention of fluid overload and management of heart failure; Discussed the importance of keeping all appointments with provider Screening for signs and symptoms of depression related to chronic disease state   Patient Self Care Activities:  Attend all scheduled provider appointments Call pharmacy for medication refills 3-7 days in advance of running out of medications Call provider office for new concerns or questions  Notify RN Care Manager of TOC call rescheduling needs Participate in Transition of Care Program/Attend TOC scheduled calls Take medications as prescribed    Plan:  The patient has been provided with contact information for the care management team and has been advised to call with any health related questions or concerns.  10/07/24 Discussed and offered 30 day TOC program.  Patient agrees to weekly telephonic calls and can cancel at any time.  The patient has been provided with contact information for the care management team and has been advised to call with any health -related questions or concerns.  The patient verbalized understanding with current plan of care.  The patient is directed to their insurance card regarding availability of benefits coverage.          Patient verbalizes understanding of instructions and care plan provided today and agrees to view in MyChart. Active MyChart status and patient understanding of how to access instructions and care plan via MyChart confirmed with patient.     The patient has been provided  with contact information for the care management team and has been advised to call with any health related questions or concerns.   Please call the care guide team at [phone] if you need to cancel or reschedule your appointment.   Please call the [place]  National Suicide Prevention Lifeline: (319) 334-8683 or TTY: 5670446788 TTY 501 235 9772) to talk to  a trained counselor call 1-800-273-TALK (toll free, 24 hour hotline) go to [facility] [address] [place] [phone]) call 911 if you are experiencing a Mental Health or Behavioral Health Crisis or need someone to talk to.  Richerd Fish, RN, BSN, CCM [facility], [facility] Management Coordinator Direct Dial: [phone]

## 2024-10-07 NOTE — Telephone Encounter (Addendum)
 Patient contacted regarding discharge from [facility] on 10/06/24.  Patient understands to follow up with Dr. [name] on 10/14/24 at 11:50AM at [address] Location. Patient understands discharge instructions? Yes Patient understands medications and regiment? Yes Patient understands to bring all medications to this visit? Yes

## 2024-10-07 NOTE — Transitions of Care (Post Inpatient/ED Visit) (Signed)
 10/07/2024  Name: [name] MRN: [id number] DOB: 02-22-1930  Today's TOC FU Call Status: Today's TOC FU Call Status:: Successful TOC FU Call Completed TOC FU Call Complete Date: 10/07/24  Patient's Name and Date of Birth confirmed. Name, DOB  Transition Care Management Follow-up Telephone Call Date of Discharge: 10/06/24 Discharge Facility: [name] [facility]) Type of Discharge: Inpatient Admission Primary Inpatient Discharge Diagnosis:: S/P TAVR (transcatheter aortic valve replacement) How have you been since you were released from the hospital?: Better Any questions or concerns?: No  Items Reviewed: Did you receive and understand the discharge instructions provided?: Yes Medications obtained,verified, and reconciled?: Yes (Medications Reviewed) (Daughter [name] in the residence/background) Any new allergies since your discharge?: No Dietary orders reviewed?: Yes Type of Diet Ordered:: low sodium heart health (We discussed alternatives to salt, lemon juice, onion powder, garlic powder avoiding the garlic salts, onion salts (sodium chloride ) reading labels) Do you have support at home?: Yes People in Home [RPT]: alone Name of Support/Comfort Primary Source: daughters - [name], [name] [name]  Medications Reviewed Today: Medications Reviewed Today     Reviewed by Gregory Richerd GRADE, RN (Registered Nurse) on 10/07/24 at 1414  Med List Status: <None>   Medication Order Taking? Sig Documenting Provider Last Dose Status Informant  acetaminophen  (TYLENOL ) 500 MG tablet [id number] Yes Take 1,000 mg by mouth every 6 (six) hours as needed for mild pain (pain score 1-3) or headache. [provider]  Active Self, Pharmacy Records  amiodarone  (PACERONE ) 200 MG tablet [id number] Yes Take 1 tablet (200 mg total) by mouth daily. [name]  Active   aspirin  EC 81 MG tablet [id number] Yes Take 1 tablet (81 mg total) by mouth daily. Swallow whole. Gregory Yetta HERO, MD   Active Self, Pharmacy Records  atorvastatin  (LIPITOR) 40 MG tablet [id number] Yes Take 1 tablet (40 mg total) by mouth daily. Gregory Yetta HERO, MD  Active Self, Pharmacy Records  clopidogrel  (PLAVIX ) 75 MG tablet [id number] Yes Take 1 tablet (75 mg total) by mouth daily. [name], Gregory PARAS, MD  Active Self, Pharmacy Records  fish oil-omega-3 fatty acids  1000 MG capsule [id number] Yes Take 1 g by mouth daily. [provider]  Active Self, Pharmacy Records  furosemide  (LASIX ) 40 MG tablet [id number] Yes Take 1 tablet (40 mg total) by mouth daily. Gregory Frames, MD  Active   levothyroxine  (SYNTHROID ) 88 MCG tablet [id number] Yes Take 1 tablet (88 mcg total) by mouth daily before breakfast. Nche, Gregory Rockford, NP  Active Self, Pharmacy Records  losartan  (COZAAR ) 25 MG tablet [id number] Yes Take 1 tablet (25 mg total) by mouth daily. Gregory Lamarr JONELLE, PA-C  Active   Multiple Vitamins-Minerals (CENTRUM SILVER MEN 50+) TABS [id number] Yes Take 1 tablet by mouth in the morning. [provider]  Active Self, Pharmacy Records  potassium chloride  SA (KLOR-CON  M) 20 MEQ tablet [id number] Yes Take 1 tablet (20 mEq total) by mouth daily. Gregory Frames, MD  Active             Home Care and Equipment/Supplies: Were Home Health Services Ordered?: Yes Name of Home Health Agency:: [facility]) Has Agency set up a time to come to your home?: Yes First Home Health Visit Date: 10/07/24 Any new equipment or medical supplies ordered?: No  Functional Questionnaire: Do you need assistance with bathing/showering or dressing?: No Do you need assistance with meal preparation?: No Do you need assistance with eating?: No Do you have difficulty maintaining  continence: No Do you need assistance with getting out of bed/getting out of a chair/moving?: No Do you have difficulty managing or taking your medications?: No (I take what's prescribed and I don't know what they're all for  but I have what is prescribed)  Follow up appointments reviewed: PCP Follow-up appointment confirmed?: Yes Date of PCP follow-up appointment?: 10/08/24 Follow-up Provider: Katheen Gregory Rockford, NP Specialist Hospital Follow-up appointment confirmed?: Yes Date of Specialist follow-up appointment?: 10/14/24 Follow-Up Specialty Provider:: Gregory Fell, MD Cardiologist Do you need transportation to your follow-up appointment?: No Do you understand care options if your condition(s) worsen?: Yes-patient verbalized understanding  SDOH Interventions Today    Flowsheet Row Most Recent Value  SDOH Interventions   Food Insecurity Interventions Intervention Not Indicated  Housing Interventions Intervention Not Indicated  Transportation Interventions Intervention Not Indicated  Utilities Interventions Intervention Not Indicated    Goals Addressed             This Visit's Progress    VBCI Transitions of Care (TOC) Care Plan       Problems:  Recent Hospitalization for treatment of CHF and s/p TAVR - Zio patch Heart Failure SPECIAL INSTRUCTIONS AVOID STRAINING STOP ANY ACTIVITY THAT CAUSES CHEST PAIN, SHORTNESS OF BREATH, DIZZINESS, SWEATING, OR EXCESSIVE WEAKNESS. SPECIAL INSTRUCTIONS FOR PATIENTS WITH HEART FAILURE: Continue to follow the instructions in your Heart Failure Patient Education information that you received during your hospital stay. Record daily weight on same scale at same time of day. If your doctor did not discuss your diet or activity in the information above, please follow a Heart Healthy Low Sodium diet and increase your activity as you feel able. Call your doctor: (Anytime you feel any of the following symptoms) 3-4 pound weight gain in 1-2 days or 2 pounds overnight Shortness of breath, with or without a dry hacking cough Swelling in the hands, feet or stomach If you have to sleep on extra pillows at night in order to breathe Patient with TAVR and Zio Patch encouraged to  call Dr. Fell for any questions, patient denies any symptoms states he feels much better since procedure. Patient encouraged to discuss sacral wound noted on photos, to address treatment options as well as HH RN  Goal:  Over the next 30 days, the patient will not experience hospital readmission  Interventions:  Transitions of Care: Doctor Visits  - discussed the importance of doctor visits  Heart Failure Interventions: Provided education on low sodium diet Advised patient to weigh each morning after emptying bladder Discussed importance of daily weight and advised patient to weigh and record daily Reviewed role of diuretics in prevention of fluid overload and management of heart failure; Discussed the importance of keeping all appointments with provider Screening for signs and symptoms of depression related to chronic disease state   Patient Self Care Activities:  Attend all scheduled provider appointments Call pharmacy for medication refills 3-7 days in advance of running out of medications Call provider office for new concerns or questions  Notify RN Care Manager of TOC call rescheduling needs Participate in Transition of Care Program/Attend TOC scheduled calls Take medications as prescribed    Plan:  The patient has been provided with contact information for the care management team and has been advised to call with any health related questions or concerns.  10/07/24 Discussed and offered 30 day TOC program.  Patient agrees to weekly telephonic calls and can cancel at any time.  The patient has been provided with contact information  for the care management team and has been advised to call with any health -related questions or concerns.  The patient verbalized understanding with current plan of care.  The patient is directed to their insurance card regarding availability of benefits coverage.           Richerd Fish, RN, BSN, CCM [facility],  [facility] Management Coordinator Direct Dial: [phone]

## 2024-10-08 ENCOUNTER — Telehealth: Payer: Self-pay

## 2024-10-08 ENCOUNTER — Ambulatory Visit: Admitting: Nurse Practitioner

## 2024-10-08 ENCOUNTER — Encounter: Payer: Self-pay | Admitting: Nurse Practitioner

## 2024-10-08 VITALS — BP 110/64 | HR 62 | Temp 98.2°F | Ht 69.0 in | Wt 149.2 lb

## 2024-10-08 DIAGNOSIS — L89152 Pressure ulcer of sacral region, stage 2: Secondary | ICD-10-CM

## 2024-10-08 DIAGNOSIS — I5033 Acute on chronic diastolic (congestive) heart failure: Secondary | ICD-10-CM

## 2024-10-08 DIAGNOSIS — R0982 Postnasal drip: Secondary | ICD-10-CM | POA: Insufficient documentation

## 2024-10-08 DIAGNOSIS — N1831 Chronic kidney disease, stage 3a: Secondary | ICD-10-CM

## 2024-10-08 DIAGNOSIS — J309 Allergic rhinitis, unspecified: Secondary | ICD-10-CM

## 2024-10-08 MED ORDER — AZELASTINE HCL 0.1 % NA SOLN: 1.0000 | Freq: Two times a day (BID) | NASAL | 0 refills | Status: AC

## 2024-10-08 NOTE — Telephone Encounter (Signed)
 Called patient to inform of updated information Roselie wanted me to informed patient of. Patient asked that I speak with his daughter [name]. I informed her that Roselie wanted me to let them know to us  duoderm-hydrocolloid dressing on the sacral pressure, change every 72 hours or sooner is soiled. Continue the use of the cusion and to start the azalastine nasal spray for post nasal drip. She informed me that she had called our office to ask about the nasal spray due to them purchasing an OTC spray not knowing that it was a Rx. Wants to know if they can use what was purchased. Informed her that I will pass this along to [facility] to review and someone will be in contact with them.

## 2024-10-08 NOTE — Patient Instructions (Addendum)
 Schedule lab appointment for blood draw next week, before thursday Maintain Heart healthy diet and daily exercise. Maintain current medications. Maintain appointment with cardiology. Use Duoderm-hydrocolloid dressing on sacral pressure, change every 72hrs or sooner if soiled. Continue use of cushion. Start azelastine  nasal for post nasal drip.  Heart Failure: Eating Plan Heart failure is a long-term condition where the heart can't pump enough blood through the body. When this happens, parts of the body don't get the blood and oxygen they need. Living with heart failure can be hard. But a healthy lifestyle and choosing the right foods may help to improve your symptoms. If you have heart failure, your eating plan will include limiting the amount of salt, also called sodium, and unhealthy fats you eat. What are tips for following this plan? Reading food labels Check food labels for the amount of sodium per serving. Choose foods that have less than 140 mg (milligrams) of sodium in each serving. Check food labels for the number of calories per serving. This is important if you need to limit your daily calorie intake to lose weight. Check food labels for the serving size. If you eat more than one serving, you'll be eating more sodium and calories than what's listed on the label. Look for foods with the words sodium-free, very low sodium, or low sodium on the package. Foods labeled as reduced sodium, lightly salted, or no salt added may still have more sodium than what's recommended for you. Cooking Avoid adding salt when cooking. Before using any salt substitutes, talk with your health care provider or an expert in healthy eating called a dietitian. Season food with salt-free seasonings, spices, or herbs. Check the label of seasoning mixes to make sure they don't contain salt. Cook with heart-healthy oils, such as olive, canola, soybean, or sunflower oil. Do not fry foods. Cook foods using  low-fat methods, like baking, boiling, grilling, and broiling. Limit unhealthy fats when cooking. To do this: Remove the skin from poultry, such as chicken. Remove all the fat you can see on meats. Skim the fat off from stews, soups, and gravies before serving them. Meal planning  Limit your intake of: Processed, canned, or prepackaged foods. Foods that are high in trans fat, such as fried foods. Sweets, desserts, sugary drinks, and other foods with added sugar. Full-fat dairy products, such as whole milk. Eat a balanced diet. This may include: 4-5 servings of fruit each day and 4-5 servings of vegetables each day. At each meal, try to fill one-half of your plate with fruits and vegetables. Up to 6-8 servings of whole grains each day. Up to 2 servings of lean meat, poultry, or fish each day. One serving of meat is equal to 3 oz (85 g). This is about the same size as a deck of cards. 2 servings of low-fat dairy each day. Heart-healthy fats. Healthy fats called omega-3 fatty acids  are a good choice. They're found in foods such as flaxseed and cold-water fish like sardines, salmon, and mackerel. Aim to eat 25-35 g (grams) of fiber a day. Foods that are high in fiber include apples, broccoli, carrots, beans, peas, and whole grains. Do not add salt or condiments that contain salt (such as soy sauce) to foods before eating. When eating at a restaurant, ask that your food be prepared with less salt or no salt, if possible. Try to eat 2 or more plant-based or meat-free meals each week. Cook more meals at home and eat less at restaurants, buffets, and  fast food places. General information Do not eat more than 2,300 mg of sodium a day. The amount of sodium that's recommended for you may be lower, depending on your condition. Stay at a healthy weight as told. Ask your provider what a healthy weight is for you. Check your weight every day. Work with your provider and dietitian to make a plan that will  help you lose weight or stay at your current weight. Limit how much fluid you drink. Ask your provider or dietitian how much fluid you can have each day. Limit or avoid alcohol as told. Recommended foods Fruits All fresh, frozen, and canned fruits. Dried fruits, such as raisins, prunes, and cranberries. Vegetables All fresh vegetables. Vegetables that are frozen without sauce or added salt. Low-sodium or sodium-free canned vegetables. Grains Bread with less than 80 mg of sodium per slice. Whole-wheat pasta, quinoa, and brown rice. Oats and oatmeal. Barley. Millet. Grits and cream of wheat. Whole-grain and whole-wheat cold cereal. Meats and other protein foods Lean cuts of meat. Skinless chicken and turkey. Fish with high omega-3 fatty acids , such as salmon, sardines, and other cold-water fishes. Eggs. Dried beans, peas, and edamame. Unsalted nuts and nut butters. Dairy Low-fat or nonfat (skim) milk and dried milk. Rice milk, soy milk, and almond milk. Low-fat or nonfat yogurt. Small amounts of reduced-sodium block cheese. Low-sodium cottage cheese. Fats and oils Olive, canola, soybean, flaxseed, avocado, or sunflower oil. Sweets and desserts Applesauce. Granola bars. Sugar-free pudding and gelatin. Frozen fruit bars. Seasoning and other foods Fresh and dried herbs. Lemon or lime juice. Vinegar. Low-sodium ketchup. Salt-free marinades, salad dressings, sauces, and seasonings. The items listed above may not be all the foods and drinks you can have. Talk with a dietitian to learn more. Foods to avoid Fruits Fruits that are dried with preservatives that contain sodium. Vegetables Canned vegetables. Frozen vegetables with sauce or seasonings. Creamed vegetables. French fries. Onion rings. Pickled vegetables and sauerkraut. Grains Bread with more than 80 mg of sodium per slice. Hot or cold cereal with more than 140 mg sodium per serving. Salted pretzels and crackers. Prepackaged breadcrumbs.  Bagels, croissants, and biscuits. Meats and other protein foods Ribs and chicken wings. Bacon, ham, pepperoni, bologna, salami, and packaged luncheon meats. Hot dogs, bratwurst, and sausage. Canned meat. Smoked meat and fish. Salted nuts and seeds. Dairy Whole milk, half-and-half, and cream. Buttermilk. Processed cheese, cheese spreads, and cheese curds. Regular cottage cheese. Feta cheese. Shredded cheese. String cheese. Fats and oils Butter, lard, shortening, ghee, and bacon fat. Canned and packaged gravies. Seasoning and other foods Onion salt, garlic salt, table salt, and sea salt. Marinades. Regular salad dressings. Relishes, pickles, and olives. Meat flavorings and tenderizers, and bouillon cubes. Horseradish, ketchup, and mustard. Worcestershire sauce. Teriyaki sauce, soy sauce (including reduced sodium). Hot sauce and Tabasco sauce. Steak sauce, fish sauce, oyster sauce, and cocktail sauce. Taco seasonings. Barbecue sauce. Tartar sauce. The items listed above may not be all the foods and drinks you should avoid. Talk with a dietitian to learn more. This information is not intended to replace advice given to you by your health care provider. Make sure you discuss any questions you have with your health care provider. Document Revised: 06/16/2023 Document Reviewed: 06/05/2023 Elsevier Patient Education  2024 [employer].

## 2024-10-08 NOTE — Assessment & Plan Note (Signed)
 Repeat CMP

## 2024-10-08 NOTE — Telephone Encounter (Signed)
 Copied from CRM [id number]. Topic: Clinical - Medication Question >> Oct 08, 2024  2:51 PM [name] wrote: Reason for CRM: Patient and his daughter are calling because they received a message stating azelastine  (ASTELIN ) 0.1 % nasal spray [[id number]] was available for pick up; however, patient was confused and bought an over the counter spray fluconazole propionate. He would like to know if he should pick up the prescribed medication or if the over the counter will work as well.

## 2024-10-08 NOTE — Assessment & Plan Note (Addendum)
 No LE edema, no CP, no SOB. Current use of furosemide  and losartan  Wt Readings from Last 3 Encounters:  10/08/24 149 lb 3.2 oz (67.7 kg)  10/06/24 144 lb 6.4 oz (65.5 kg)  09/28/24 144 lb (65.3 kg)    BP Readings from Last 3 Encounters:  10/08/24 110/64  10/07/24 118/64  10/06/24 137/61    Repeat BNP and CMP Maintain appointment with cardiology 10/14/2024

## 2024-10-08 NOTE — Progress Notes (Unsigned)
 Established Patient Visit  Patient: [name]   DOB: 11/08/1929   89 y.o. Male  MRN: [id number] Visit Date: 10/08/2024  Subjective:    Chief Complaint  Patient presents with   Follow-up    Follow up for recent hospital stay    HPI Accompanied by daughter. Acute on chronic diastolic (congestive) heart failure (HCC) No LE edema, no CP, no SOB. Current use of furosemide  and losartan  Wt Readings from Last 3 Encounters:  10/08/24 149 lb 3.2 oz (67.7 kg)  10/06/24 144 lb 6.4 oz (65.5 kg)  09/28/24 144 lb (65.3 kg)    BP Readings from Last 3 Encounters:  10/08/24 110/64  10/07/24 118/64  10/06/24 137/61    Repeat BNP and CMP Maintain appointment with cardiology 10/14/2024  CKD (chronic kidney disease) stage 3, GFR 30-59 ml/min (HCC) Repeat CMP  Wt Readings from Last 3 Encounters:  10/08/24 149 lb 3.2 oz (67.7 kg)  10/06/24 144 lb 6.4 oz (65.5 kg)  09/28/24 144 lb (65.3 kg)    Reviewed medical, surgical, and social history today  Medications: Outpatient Medications Prior to Visit  Medication Sig   acetaminophen  (TYLENOL ) 500 MG tablet Take 1,000 mg by mouth every 6 (six) hours as needed for mild pain (pain score 1-3) or headache.   amiodarone  (PACERONE ) 200 MG tablet Take 1 tablet (200 mg total) by mouth daily.   aspirin  EC 81 MG tablet Take 1 tablet (81 mg total) by mouth daily. Swallow whole.   atorvastatin  (LIPITOR) 40 MG tablet Take 1 tablet (40 mg total) by mouth daily.   clopidogrel  (PLAVIX ) 75 MG tablet Take 1 tablet (75 mg total) by mouth daily.   fish oil-omega-3 fatty acids  1000 MG capsule Take 1 g by mouth daily.   furosemide  (LASIX ) 40 MG tablet Take 1 tablet (40 mg total) by mouth daily.   levothyroxine  (SYNTHROID ) 88 MCG tablet Take 1 tablet (88 mcg total) by mouth daily before breakfast.   losartan  (COZAAR ) 25 MG tablet Take 1 tablet (25 mg total) by mouth daily.   Multiple Vitamins-Minerals (CENTRUM SILVER MEN 50+) TABS Take 1 tablet  by mouth in the morning.   potassium chloride  SA (KLOR-CON  M) 20 MEQ tablet Take 1 tablet (20 mEq total) by mouth daily.   No facility-administered medications prior to visit.   Reviewed past medical and social history.   ROS per HPI above  {Show previous labs (optional):23779}    Objective:  BP 110/64 (BP Location: Left Arm, Patient Position: Sitting, Cuff Size: Normal)   Pulse 62   Temp 98.2 F (36.8 C) (Oral)   Ht 5' 9 (1.753 m)   Wt 149 lb 3.2 oz (67.7 kg)   SpO2 95%   BMI 22.03 kg/m      Physical Exam Vitals and nursing note reviewed.  Cardiovascular:     Rate and Rhythm: Normal rate and regular rhythm.     Pulses: Normal pulses.     Heart sounds: Normal heart sounds.  Pulmonary:     Effort: Pulmonary effort is normal.     Breath sounds: Normal breath sounds.  Musculoskeletal:     Right lower leg: No edema.     Left lower leg: No edema.  Skin:    Findings: Erythema present.         Comments: Healed ulcer on sacrum with surrounding redness.no drainage or induration or eschar. Quarter size hematoma noted in bilateral  groin, incisions are dry and well approximated, no thrill palpated.  Neurological:     Mental Status: He is alert and oriented to person, place, and time.     No results found for any visits on 10/08/24.    Assessment & Plan:    Problem List Items Addressed This Visit     Acute on chronic diastolic (congestive) heart failure (HCC)   No LE edema, no CP, no SOB. Current use of furosemide  and losartan  Wt Readings from Last 3 Encounters:  10/08/24 149 lb 3.2 oz (67.7 kg)  10/06/24 144 lb 6.4 oz (65.5 kg)  09/28/24 144 lb (65.3 kg)    BP Readings from Last 3 Encounters:  10/08/24 110/64  10/07/24 118/64  10/06/24 137/61    Repeat BNP and CMP Maintain appointment with cardiology 10/14/2024      Relevant Orders   B Nat Peptide   CKD (chronic kidney disease) stage 3, GFR 30-59 ml/min (HCC)   Repeat CMP      Relevant Orders    Comprehensive metabolic panel with GFR   CBC   Other Visit Diagnoses       Pressure injury of sacral region, stage 2 (HCC)    -  Primary     Allergic rhinitis with postnasal drip          Advised to use duoderm-hydrocolloid dressing on sacral pressure ulcer and continue use of pressure relief cushion. Provided ED precautions post right heart catheterization and transcatheter aortic valve replacement.  Return in about 3 months (around 01/06/2025) for HTN, prediabetes, CKD.     Roselie Mood, NP

## 2024-10-11 ENCOUNTER — Telehealth (HOSPITAL_COMMUNITY): Payer: Self-pay

## 2024-10-11 NOTE — Telephone Encounter (Signed)
 Called and left a voice message asking patient to give me a call back at the office at [phone] per DPR on file.

## 2024-10-11 NOTE — Telephone Encounter (Signed)
Pt is not interested in the cardiac rehab program. Closed referral 

## 2024-10-12 ENCOUNTER — Other Ambulatory Visit (HOSPITAL_COMMUNITY)

## 2024-10-12 ENCOUNTER — Other Ambulatory Visit: Payer: Self-pay

## 2024-10-12 ENCOUNTER — Telehealth: Payer: Self-pay

## 2024-10-12 NOTE — Patient Instructions (Signed)
 Visit Information  Thank you for taking time to visit with me today. Please don't hesitate to contact me if I can be of assistance to you before our next scheduled telephone appointment.  Our next appointment is by telephone on 10/21/24 at 1300  Following is a copy of your care plan:   Goals Addressed             This Visit's Progress    [facility] Transitions of Care (TOC) Care Plan       Problems:  Recent Hospitalization for treatment of CHF and s/p TAVR - Zio patch Heart Failure SPECIAL INSTRUCTIONS AVOID STRAINING STOP ANY ACTIVITY THAT CAUSES CHEST PAIN, SHORTNESS OF BREATH, DIZZINESS, SWEATING, OR EXCESSIVE WEAKNESS. SPECIAL INSTRUCTIONS FOR PATIENTS WITH HEART FAILURE: Continue to follow the instructions in your Heart Failure Patient Education information that you received during your hospital stay. Record daily weight on same scale at same time of day. If your doctor did not discuss your diet or activity in the information above, please follow a Heart Healthy Low Sodium diet and increase your activity as you feel able. Call your doctor: (Anytime you feel any of the following symptoms) 3-4 pound weight gain in 1-2 days or 2 pounds overnight Shortness of breath, with or without a dry hacking cough Swelling in the hands, feet or stomach If you have to sleep on extra pillows at night in order to breathe Patient with TAVR and Zio Patch encouraged to call Dr. Wonda for any questions, patient denies any symptoms states he feels much better since procedure. Patient encouraged to discuss sacral wound noted on photos, to address treatment options as well as [facility] RN reviewed 10/12/24 patient states he feels it's healed pretty much without concerns. 10/12/24 Review of hospital follow up and labs tomorrow. Patient planning to fly to [place]  10/26/24 - 11/08/24. Encouraged patient to let providers know especially cardiologist for clearance and precautions for traveling needs.  Goal:  Over the next 30 days,  the patient will not experience hospital readmission  Interventions:  Transitions of Care: Doctor Visits  - discussed the importance of doctor visits  Heart Failure Interventions: Provided education on low sodium diet Advised patient to weigh each morning after emptying bladder Discussed importance of daily weight and advised patient to weigh and record daily Reviewed role of diuretics in prevention of fluid overload and management of heart failure; Discussed the importance of keeping all appointments with provider Screening for signs and symptoms of depression related to chronic disease state  10/12/24 Patient is weighing daily and has electronic scales with information sent back to [facility] and gets weekly calls from [place] as well. Review of systems and patient progressing able to tolerate activities without shortness of breath he state.  Patient Self Care Activities:  Attend all scheduled provider appointments Call pharmacy for medication refills 3-7 days in advance of running out of medications Call provider office for new concerns or questions  Notify RN Care Manager of [facility] call rescheduling needs Participate in Transition of Care Program/Attend TOC scheduled calls Take medications as prescribed   10/12/24 Discussed having enough medications for his Christmas trip planned. Daughter in the background states she writing information down to discuss at upcoming appointments   Plan:  The patient has been provided with contact information for the care management team and has been advised to call with any health related questions or concerns.  10/07/24 Discussed and offered 30 day TOC program.  Patient agrees to weekly telephonic calls and can  cancel at any time.  The patient has been provided with contact information for the care management team and has been advised to call with any health -related questions or concerns.  The patient verbalized understanding with current plan of care.  The patient is  directed to their insurance card regarding availability of benefits coverage.  Follow up next week for ongoing care needs with this clinical research associate.         Patient verbalizes understanding of instructions and care plan provided today and agrees to view in MyChart. Active MyChart status and patient understanding of how to access instructions and care plan via MyChart confirmed with patient.     The patient has been provided with contact information for the care management team and has been advised to call with any health related questions or concerns.  Follow up with provider re: his planned trip to [place]  for the holidays and for medication needs and potential regarding wearing TED hose or stockings discussed to check with cardiologist at appointment. Patient verbalized understanding and daughter states she has written it down to discuss as well.  Please call the care guide team at [phone] if you need to cancel or reschedule your appointment.   Please call the [place]  National Suicide Prevention Lifeline: 321 230 6514 or TTY: 802-827-4215 TTY (508)676-7515) to talk to a trained counselor if you are experiencing a Mental Health or Behavioral Health Crisis or need someone to talk to.  Richerd Fish, RN, BSN, CCM [facility], [facility] Management Coordinator Direct Dial: [phone]

## 2024-10-12 NOTE — Telephone Encounter (Signed)
 Called patient and made him aware of [place]'s comments of using the Rx nasal spray not the OTC nasal spray. Patient stated that he is feeling better and that when he got another notification from his pharmacy about the other being ready for pick up he stated that he told the pharmacy to just cancel it since he had gotten the OTC one and has not needed to use it. He wanted me to let Roselie know he is feeling better and he has not use the OTC nasal spray. I thanked him for taking my call and informed him that I will give Roselie the information about the nasal sprays.

## 2024-10-12 NOTE — Transitions of Care (Post Inpatient/ED Visit) (Signed)
 Transition of Care week 2  Visit Note  10/12/2024  Name: [name] MRN: [id number]          DOB: Mar 21, 1930  Situation: Patient enrolled in [facility] 30-day program. Visit completed with patient by telephone.   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 Wonda and Dr. Daniel   Thyroid  disease    hypothyroidism    Assessment: Patient Reported Symptoms: Cognitive Cognitive Status: Able to follow simple commands, Alert and oriented to person, place, and time, Normal speech and language skills      Neurological Neurological Review of Symptoms: No symptoms reported    HEENT HEENT Symptoms Reported: No symptoms reported      Cardiovascular Cardiovascular Symptoms Reported: No symptoms reported Does patient have uncontrolled Hypertension?: No (139/63) Cardiovascular Management Strategies: Activity, Adequate rest, Medical device, Medication therapy Weight: 139 lb 8 oz (63.3 kg) Cardiovascular Self-Management Outcome: 4 (good) Cardiovascular Comment: [employer] has sent a monitoring scale for me daily  Respiratory Respiratory Symptoms Reported: No symptoms reported Other Respiratory Symptoms: Minimal coughing and no shortness of breath    Endocrine Endocrine Symptoms Reported: No symptoms reported Is patient diabetic?: No Endocrine Self-Management Outcome: 4 (good)  Gastrointestinal Gastrointestinal Symptoms Reported: No symptoms reported Additional Gastrointestinal Details: loose yesterday      Genitourinary  Genitourinary Symptoms Reported: Frequency Additional Genitourinary Details: spoke about diuretics ([employer] sent him a scale that sends them his weight)    Integumentary Integumentary Symptoms Reported: Incision, Wound    Musculoskeletal Musculoskelatal Symptoms Reviewed: No symptoms reported Musculoskeletal Management Strategies: Medication therapy, Routine screening      Psychosocial Psychosocial Symptoms Reported: No symptoms reported         Today's Vitals   10/12/24 1306  BP: 139/63  Weight: 139 lb 8 oz (63.3 kg)   Pain Scale: 0-10 Pain Score: 0-No pain  Medications Reviewed Today     Reviewed by Eilleen Richerd GRADE, RN (Registered Nurse) on 10/12/24 at 1312  Med List Status: <None>   Medication Order Taking? Sig Documenting Provider Last Dose Status Informant  acetaminophen  (TYLENOL ) 500 MG tablet [id number] Yes Take 1,000 mg by mouth every 6 (six) hours as needed for mild pain (pain score 1-3) or headache. [provider]  Active Self, Pharmacy Records  amiodarone  (PACERONE ) 200 MG tablet [id number] Yes Take 1 tablet (200 mg total) by mouth daily. Sebastian Lamarr JONELLE DEVONNA  Active   aspirin  EC 81 MG tablet [id number] Yes Take 1 tablet (81 mg total) by mouth daily. Swallow whole. [name], MD  Active Self, Pharmacy Records  atorvastatin  (LIPITOR) 40 MG tablet [id number] Yes Take 1 tablet (40 mg total) by mouth daily. [name], MD  Active Self, Pharmacy Records  azelastine  (ASTELIN ) 0.1 % nasal spray [id number] Yes Place 1 spray into both nostrils 2 (two) times daily. Use in each nostril as directed Nche, [name], NP  Active   clopidogrel  (PLAVIX ) 75 MG tablet [id number] Yes Take 1 tablet (75 mg total) by mouth  daily. [name], [name], MD  Active Self, Pharmacy Records  fish oil-omega-3 fatty acids  1000 MG capsule [id number] Yes Take 1 g by mouth daily. [provider]  Active Self, Pharmacy Records  furosemide  (LASIX ) 40 MG tablet [id number] Yes Take 1  tablet (40 mg total) by mouth daily. Fairy Frames, MD  Active   levothyroxine  (SYNTHROID ) 88 MCG tablet [id number] Yes Take 1 tablet (88 mcg total) by mouth daily before breakfast. Nche, [name], NP  Active Self, Pharmacy Records  losartan  (COZAAR ) 25 MG tablet [id number] Yes Take 1 tablet (25 mg total) by mouth daily. Sebastian Lamarr SAUNDERS, PA-C  Active   Multiple Vitamins-Minerals (CENTRUM SILVER MEN 50+) TABS [id number] Yes Take 1 tablet by mouth in the morning. [provider]  Active Self, Pharmacy Records  potassium chloride  SA (KLOR-CON  M) 20 MEQ tablet [id number] Yes Take 1 tablet (20 mEq total) by mouth daily. Fairy Frames, MD  Active             Recommendation:   Continue Current Plan of Care  Follow Up Plan:   Telephone follow-up in 1 week  Richerd Fish, RN, BSN, CCM [place], [facility] Management Coordinator Direct Dial: [phone]

## 2024-10-13 ENCOUNTER — Institutional Professional Consult (permissible substitution): Admitting: Cardiovascular Disease

## 2024-10-13 ENCOUNTER — Other Ambulatory Visit

## 2024-10-13 DIAGNOSIS — N1831 Chronic kidney disease, stage 3a: Secondary | ICD-10-CM | POA: Diagnosis not present

## 2024-10-13 DIAGNOSIS — I10 Essential (primary) hypertension: Secondary | ICD-10-CM | POA: Diagnosis not present

## 2024-10-13 DIAGNOSIS — I5033 Acute on chronic diastolic (congestive) heart failure: Secondary | ICD-10-CM

## 2024-10-13 LAB — COMPREHENSIVE METABOLIC PANEL WITH GFR
ALT: 24 U/L (ref 0–53)
AST: 28 U/L (ref 0–37)
Albumin: 4.2 g/dL (ref 3.5–5.2)
Alkaline Phosphatase: 76 U/L (ref 39–117)
BUN: 27 mg/dL — ABNORMAL HIGH (ref 6–23)
CO2: 32 meq/L (ref 19–32)
Calcium: 9.8 mg/dL (ref 8.4–10.5)
Chloride: 96 meq/L (ref 96–112)
Creatinine, Ser: 1.56 mg/dL — ABNORMAL HIGH (ref 0.40–1.50)
GFR: 38.47 mL/min — ABNORMAL LOW (ref >60.00)
Glucose, Bld: 152 mg/dL — ABNORMAL HIGH (ref 70–99)
Potassium: 4.4 meq/L (ref 3.5–5.1)
Sodium: 135 meq/L (ref 135–145)
Total Bilirubin: 0.6 mg/dL (ref 0.2–1.2)
Total Protein: 6.6 g/dL (ref 6.0–8.3)

## 2024-10-13 LAB — CBC
HCT: 40.6 % (ref 39.0–52.0)
Hemoglobin: 13.7 g/dL (ref 13.0–17.0)
MCHC: 33.7 g/dL (ref 30.0–36.0)
MCV: 92.3 fl (ref 78.0–100.0)
Platelets: 197 K/uL (ref 150.0–400.0)
RBC: 4.4 Mil/uL (ref 4.22–5.81)
RDW: 13.1 % (ref 11.5–15.5)
WBC: 7.2 K/uL (ref 4.0–10.5)

## 2024-10-13 LAB — RENAL FUNCTION PANEL
Albumin: 4.2 g/dL (ref 3.5–5.2)
BUN: 27 mg/dL — ABNORMAL HIGH (ref 6–23)
CO2: 32 meq/L (ref 19–32)
Calcium: 9.8 mg/dL (ref 8.4–10.5)
Chloride: 96 meq/L (ref 96–112)
Creatinine, Ser: 1.56 mg/dL — ABNORMAL HIGH (ref 0.40–1.50)
GFR: 38.47 mL/min — ABNORMAL LOW (ref >60.00)
Glucose, Bld: 152 mg/dL — ABNORMAL HIGH (ref 70–99)
Phosphorus: 3.3 mg/dL (ref 2.3–4.6)
Potassium: 4.4 meq/L (ref 3.5–5.1)
Sodium: 135 meq/L (ref 135–145)

## 2024-10-13 LAB — BRAIN NATRIURETIC PEPTIDE: Pro B Natriuretic peptide (BNP): 596 pg/mL — ABNORMAL HIGH (ref 0.0–100.0)

## 2024-10-14 ENCOUNTER — Ambulatory Visit: Attending: Cardiovascular Disease | Admitting: Cardiovascular Disease

## 2024-10-14 ENCOUNTER — Encounter: Payer: Self-pay | Admitting: Cardiovascular Disease

## 2024-10-14 ENCOUNTER — Ambulatory Visit: Payer: Self-pay | Admitting: Nurse Practitioner

## 2024-10-14 VITALS — BP 110/54 | HR 57 | Ht 69.0 in | Wt 143.4 lb

## 2024-10-14 DIAGNOSIS — Z952 Presence of prosthetic heart valve: Secondary | ICD-10-CM

## 2024-10-14 DIAGNOSIS — I2510 Atherosclerotic heart disease of native coronary artery without angina pectoris: Secondary | ICD-10-CM | POA: Diagnosis not present

## 2024-10-14 DIAGNOSIS — I4510 Unspecified right bundle-branch block: Secondary | ICD-10-CM

## 2024-10-14 DIAGNOSIS — I5022 Chronic systolic (congestive) heart failure: Secondary | ICD-10-CM

## 2024-10-14 DIAGNOSIS — N1831 Chronic kidney disease, stage 3a: Secondary | ICD-10-CM

## 2024-10-14 MED ORDER — FUROSEMIDE 20 MG PO TABS: 20.0000 mg | ORAL_TABLET | Freq: Every day | ORAL | 3 refills | Status: DC

## 2024-10-14 NOTE — Assessment & Plan Note (Addendum)
 Mean gradient 8 mmHg, no paravalvular regurgitation.  Patient doing very well.  He has had no change in his EKG and continues to have a normal PR interval with right bundle branch block.  He will have 30-day follow-up with an echocardiogram per protocol.

## 2024-10-14 NOTE — Progress Notes (Signed)
 Cardiology Office Note:    Date:  10/14/2024   ID:  [name], DOB 1930-07-02, MRN [id number]  PCP:  [name], NP   [facility] Providers Cardiologist:  [name], MD Structural Heart:  [name], MD    Referring MD: [name], NP   Chief Complaint  Patient presents with   Follow-up    Hospital follow-up s/p TAVR    History of Present Illness:    Deidrick Rainey is a 89 y.o. male presenting for follow-up of acute on chronic HFrEF in the setting of severe aortic stenosis.  He was brought in 1 day prior to TAVR for right heart catheterization and medical optimization.  He then underwent TAVR 10/05/2024 with a 23 mm Edwards SAPIEN 3 valve without complication.  His postprocedure mean gradient is 8 mmHg with no paravalvular regurgitation.  Preoperatively he had right bundle branch block but had no change in his heart rhythm with TAVR.  He was discharged with a ZIO AT to evaluate for any late presenting AV block.  He returns to clinic today for 1 week TAVR follow-up.  The patient is here with his daughter today.  He feels great and has no complaints.  He is urinating frequently with Lasix  and would like to get off of it if possible.  He denies chest pain or shortness of breath.  No leg swelling.  No problem with his groin access sites.   Current Medications: Active Medications[1]   Allergies:   Patient has no known allergies.   ROS:   Please see the history of present illness.    All other systems reviewed and are negative.  EKGs/Labs/Other Studies Reviewed:    The following studies were reviewed today: Cardiac Studies & Procedures   ______________________________________________________________________________________________ CARDIAC CATHETERIZATION  CARDIAC CATHETERIZATION 10/04/2024  Conclusion HEMODYNAMICS: RA:       6 mmHg (mean) RV:       50/4, 6 mmHg PA:       50/22 mmHg (32 mean) PCWP: 22 mmHg (mean) with v waves  to 30  Estimated Fick CO/CI   3.97L/min, 2.15L/min/m2 Thermodilution CO/CI   3.19L/min, 1.72L/min/m2  TPG  10  mmHg PVR  3.13 Wood Units PAPi  4.6   IMPRESSION: Right heart catheterization with leave in swan for optimization prior to TAVr Normal right, moderately elevated left heart filling pressures with significant v waves Moderately reduced cardiac index by TD, improved ectopy from prior RHC 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 Normal filling pressures with low cardiac output in setting of significant AS (Output drops while in bigeminy)  Plan/Discussion:  Patient pending TAVR. Next Tuesday. D/w Dr. Fell & Dr. Anner. Can consider increasing outpatient diuretics and admitting on Monday through cath lab for Swan and pre-TAVR optimization with DBA as an option.  Toribio Fuel, MD 8:51 AM     ECHOCARDIOGRAM  ECHOCARDIOGRAM COMPLETE 10/06/2024  Narrative ECHOCARDIOGRAM REPORT    Patient Name:   SEBASTAIN FISHBAUGH [facility] Date of Exam: 10/06/2024 Medical Rec #:  [id number]  Height:       69.0 in Accession #:    [id number]         Weight:       144.4 lb Date of Birth:  June 09, 1930         BSA:          1.799 m Patient Age:    89 years           BP:           146/48 mmHg Patient Gender: M                  HR:           76 bpm. Exam Location:  Inpatient  Procedure: 2D Echo (Both Spectral and Color Flow Doppler were utilized during procedure).  Indications:    Post TAVR evaluation  History:        Patient has prior history of Echocardiogram  examinations, most recent 10/05/2024. CAD, chronic kidney disease; Risk Factors:Hypertension and Diabetes. Aortic Valve: 23 mm Edwards Sapien prosthetic, stented (TAVR) valve is present in the aortic position. Procedure Date: 10/05/24.  Sonographer:    Tinnie Barefoot RDCS Referring Phys: [id number] KATHRYN R THOMPSON  IMPRESSIONS   1. Left ventricular ejection fraction, by estimation, is 35 to 40%. Left ventricular ejection fraction by 2D MOD biplane is 39.3 %. The left ventricle has moderately decreased function. The left ventricle demonstrates regional wall motion abnormalities (see scoring diagram/findings for description). Left ventricular diastolic parameters are consistent with Grade II diastolic dysfunction (pseudonormalization). 2. Right ventricular systolic function is normal. The right ventricular size is normal. There is normal pulmonary artery systolic pressure. 3. Left atrial size was mildly dilated. 4. Lead/Catheter in RA. 5. The mitral valve is normal in structure. No evidence of mitral valve regurgitation. 6. Well seated but leaflet not well visualized with normal function (PV 2 m/sec, MG 8 mmHg) and no evidence of translvalvular or paravalvular regurgitation. The aortic valve has been repaired/replaced. Aortic valve regurgitation is not visualized. There is a 23 mm Edwards Sapien prosthetic (TAVR) valve present in the aortic position. Procedure Date: 10/05/24.  Comparison(s): No significant change from prior study. EF seems to be worse and there is now TAVR.  FINDINGS Left Ventricle: Left ventricular ejection fraction, by estimation, is 35 to 40%. Left ventricular ejection fraction by 2D MOD biplane is 39.3 %. The left ventricle has moderately decreased function. The left ventricle demonstrates regional wall motion abnormalities. The left ventricular internal cavity size was normal in size. There is no left ventricular hypertrophy. Left ventricular diastolic parameters are  consistent with Grade II diastolic dysfunction (pseudonormalization).   LV Wall Scoring: The entire anterior wall, entire lateral wall, mid and distal anterior septum, basal inferior segment, and apex are hypokinetic. The mid and distal inferior wall, basal anteroseptal segment, mid inferoseptal segment, and basal inferoseptal segment are normal.  Right Ventricle: The right ventricular size is normal. No increase in right ventricular wall thickness. Right ventricular systolic function is normal. There is normal pulmonary artery systolic pressure. The tricuspid regurgitant velocity is 2.36 m/s, and with an assumed right atrial pressure of 3 mmHg, the estimated right ventricular systolic pressure is 25.3 mmHg.  Left Atrium: Left atrial size was mildly dilated.  Right Atrium: Lead/Catheter in RA. Right atrial size was normal in size.  Pericardium: There is no evidence of pericardial effusion.  Mitral Valve: The mitral valve is normal in structure. No evidence of mitral valve regurgitation. MV peak gradient, 6.4  mmHg. The mean mitral valve gradient is 2.0 mmHg.  Tricuspid Valve: The tricuspid valve is normal in structure. Tricuspid valve regurgitation is mild . No evidence of tricuspid stenosis.  Aortic Valve: Well seated but leaflet not well visualized with normal function (PV 2 m/sec, MG 8 mmHg) and no evidence of translvalvular or paravalvular regurgitation. The aortic valve has been repaired/replaced. Aortic valve regurgitation is not visualized. Aortic valve mean gradient measures 8.0 mmHg. Aortic valve peak gradient measures 16.0 mmHg. Aortic valve area, by VTI measures 2.07 cm. There is a 23 mm Edwards Sapien prosthetic, stented (TAVR) valve present in the aortic position. Procedure Date: 10/05/24.  Pulmonic Valve: The pulmonic valve was normal in structure. Pulmonic valve regurgitation is not visualized. No evidence of pulmonic stenosis.  Aorta: The aortic root and ascending aorta are  structurally normal, with no evidence of dilitation.  IAS/Shunts: No atrial level shunt detected by color flow Doppler.   LEFT VENTRICLE PLAX 2D                        Biplane EF (MOD) LVIDd:         5.80 cm         LV Biplane EF:   Left LVIDs:         4.90 cm                          ventricular LV PW:         1.00 cm                          ejection LV IVS:        1.20 cm                          fraction by LVOT diam:     1.90 cm                          2D MOD LV SV:         75                               biplane is LV SV Index:   42                               39.3 %. LVOT Area:     2.84 cm LV IVRT:       77 msec         Diastology LV e' medial:    4.46 cm/s LV E/e' medial:  27.6 LV Volumes (MOD)               LV e' lateral:   5.66 cm/s LV vol d, MOD    153.0 ml      LV E/e' lateral: 21.7 A2C: LV vol d, MOD    130.0 ml A4C: LV vol s, MOD    95.1 ml A2C: LV vol s, MOD    80.7 ml A4C: LV SV MOD A2C:   57.9 ml LV SV MOD A4C:   130.0 ml LV SV MOD BP:    57.6 ml  RIGHT VENTRICLE             IVC RV  Basal diam:  2.70 cm     IVC diam: 1.50 cm RV S prime:     11.50 cm/s TAPSE (M-mode): 2.4 cm  LEFT ATRIUM             Index        RIGHT ATRIUM           Index LA diam:        3.80 cm 2.11 cm/m   RA Area:     16.50 cm LA Vol (A2C):   61.3 ml 34.08 ml/m  RA Volume:   43.00 ml  23.90 ml/m LA Vol (A4C):   47.7 ml 26.52 ml/m LA Biplane Vol: 55.0 ml 30.57 ml/m AORTIC VALVE AV Area (Vmax):    2.03 cm AV Area (Vmean):   1.95 cm AV Area (VTI):     2.07 cm AV Vmax:           200.00 cm/s AV Vmean:          133.000 cm/s AV VTI:            0.363 m AV Peak Grad:      16.0 mmHg AV Mean Grad:      8.0 mmHg LVOT Vmax:         143.00 cm/s LVOT Vmean:        91.650 cm/s LVOT VTI:          0.266 m LVOT/AV VTI ratio: 0.73  AORTA Ao Root diam: 2.70 cm Ao Asc diam:  3.40 cm  MITRAL VALVE                TRICUSPID VALVE MV Area (PHT): 4.21 cm     TR Peak grad:   22.3  mmHg MV Area VTI:   2.37 cm     TR Vmax:        236.00 cm/s MV Peak grad:  6.4 mmHg MV Mean grad:  2.0 mmHg     SHUNTS MV Vmax:       1.26 m/s     Systemic VTI:  0.27 m MV Vmean:      65.9 cm/s    Systemic Diam: 1.90 cm MV Decel Time: 180 msec MR Peak grad: 67.9 mmHg MR Vmax:      412.00 cm/s MV E velocity: 123.00 cm/s MV A velocity: 85.20 cm/s MV E/A ratio:  1.44  Franck Azobou Tonleu Electronically signed by Joelle Cedars Tonleu Signature Date/Time: 10/06/2024/10:46:01 AM    Final      CT SCANS  CT CORONARY MORPH W/CTA COR W/SCORE 09/23/2024  Addendum 09/30/2024 12:19 AM ADDENDUM REPORT: 09/30/2024 00:17  EXAM: OVER-READ INTERPRETATION  CT CHEST  The following report is an over-read performed by radiologist Dr. Oneil Devonshire of [facility] on 09/30/2024. This over-read does not include interpretation of cardiac or coronary anatomy or pathology. The coronary calcium  score/coronary CTA interpretation by the cardiologist is attached.  COMPARISON:  None.  FINDINGS: Cardiovascular: Atherosclerotic calcifications of the thoracic aorta are noted. No aneurysmal dilatation is seen. Pulmonary artery is within normal limits.  Mediastinum/Nodes: There are no enlarged lymph nodes within the visualized mediastinum.  Lungs/Pleura: Bilateral large pleural effusions are noted left greater than right. Underlying atelectasis in the left lower lobe is noted. 5 mm nodule is noted in the subpleural space in the right middle lobe best seen on image 29 of series 3 of 7.  Upper abdomen: No significant findings in the visualized upper abdomen.  Musculoskeletal/Chest wall: No chest wall mass or  suspicious osseous findings within the visualized chest.  IMPRESSION: Aortic Atherosclerosis (ICD10-I70.0).  Bilateral large pleural effusions left greater than right.  5 mm nodule in the right middle lobe as described. No follow-up is recommended.   Electronically  Signed By: Oneil Devonshire M.D. On: 09/30/2024 00:17  Narrative CLINICAL DATA:  Severe Aortic Stenosis.  EXAM: Cardiac TAVR CT  TECHNIQUE: A non-contrast, gated CT scan was obtained with axial slices of 2.5 mm through the heart for aortic valve scoring. A 80 kV retrospective, gated, contrast cardiac scan was obtained. Gantry rotation speed was 230 msec and collimation was 0.63 mm. Nitroglycerin  was not given. The 3D dataset was reconstructed in systole with motion correction. The 3D data set was reconstructed in 5% intervals of the 0-95% of the R-R cycle. Systolic and diastolic phases were analyzed on a dedicated workstation using MPR, MIP, and VRT modes. The patient received 100 cc of contrast.  FINDINGS: Image quality: Excellent.  Noise artifact is: Limited.  Valve Morphology: Tricuspid aortic valve with severely calcified leaflets and severely restricted leaflet motion in systole.  Aortic Valve Calcium  score: 3155  Aortic annular dimension:  Phase assessed: 25%  Annular area: 429 mm2  Annular perimeter: 75.0 mm  Max diameter: 26.8 mm  Min diameter: 20.8 mm  Annular and subannular calcification: Single, moderate calcification under the LCC extending into the LVOT.  Membranous septum length: 7.5 mm  Optimal coplanar projection: RAO 3 CAU 25  Coronary Artery Height above Annulus:  Left Main: 14.9 mm  Right Coronary: 23.3 mm  Sinus of Valsalva Measurements:  Non-coronary: 31 mm  Right-coronary: 31 mm  Left-coronary: 31 mm  Sinus of Valsalva Height:  Non-coronary: 27.3 mm  Right-coronary: 29.2 mm  Left-coronary: 23.4 mm  Sinotubular Junction: 30 mm  Ascending Thoracic Aorta: 35 mm  Coronary Arteries: Normal coronary origin. Right dominance. The study was performed without use of NTG and is insufficient for plaque evaluation. Please refer to recent cardiac catheterization for coronary assessment. Severe coronary calcifications.  Cardiac  Morphology:  Right Atrium: Right atrial size is within normal limits. Contrast reflux into the IVC consistent with elevated RA pressure.  Right Ventricle: The right ventricular cavity is within normal limits.  Left Atrium: Left atrial size is normal in size with no left atrial appendage filling defect. Small PFO.  Left Ventricle: The ventricular cavity size is within normal limits.  Pulmonary arteries: Dilated pulmonary artery suggestive of pulmonary hypertension.  Pulmonary veins: Normal pulmonary venous drainage.  Pericardium: Normal thickness with no significant effusion or calcium  present.  Mitral Valve: The mitral valve is normal structure without significant calcification.  Extra-cardiac findings: Moderate bilateral pleural effusions. See attached radiology report for non-cardiac structures.  IMPRESSION: 1. Annular measurements support a 23 mm S3 or 26 mm Evolut Pro.  2. Single, moderate calcification under the LCC extending into the LVOT.  3. Sufficient coronary to annulus distance.  4. Optimal Fluoroscopic Angle for Delivery: RAO 3 CAU 25  5. Moderate bilateral pleural effusions.  6. Dilated pulmonary artery suggestive of pulmonary hypertension.  Darryle T. Barbaraann, MD  Electronically Signed: By: Darryle Barbaraann M.D. On: 09/23/2024 13:32     ______________________________________________________________________________________________      EKG:   EKG Interpretation Date/Time:  Thursday October 14 2024 12:17:40 EST Ventricular Rate:  57 PR Interval:  180 QRS Duration:  162 QT Interval:  470 QTC Calculation: 457 R Axis:   129  Text Interpretation: Sinus bradycardia Right bundle branch block , plus right ventricular hypertrophy When  compared with ECG of 06-Oct-2024 06:55, QRS axis Shifted right Nonspecific T wave abnormality, worse in Lateral leads Confirmed by Wonda Sharper [phone]) on 10/14/2024 12:29:44 PM    Recent Labs: 06/26/2024: TSH  2.483 10/04/2024: B Natriuretic Peptide 1,552.5 10/06/2024: Magnesium  1.9 10/13/2024: ALT 24; BUN 27; BUN 27; Creatinine, Ser 1.56; Creatinine, Ser 1.56; Hemoglobin 13.7; Platelets 197.0; Potassium 4.4; Potassium 4.4; Pro B Natriuretic peptide (BNP) 596.0; Sodium 135; Sodium 135  Recent Lipid Panel    Component Value Date/Time   CHOL 134 09/08/2024 0323   CHOL 135 11/20/2021 1152   TRIG 93 09/08/2024 0323   HDL 39 (L) 09/08/2024 0323   HDL 29 (L) 11/20/2021 1152   CHOLHDL 3.4 09/08/2024 0323   VLDL 19 09/08/2024 0323   LDLCALC 76 09/08/2024 0323   LDLCALC 78 11/20/2021 1152   LDLDIRECT 66.0 07/06/2024 1449     Risk Assessment/Calculations:                Physical Exam:    VS:  BP (!) 110/54   Pulse (!) 57   Ht 5' 9 (1.753 m)   Wt 143 lb 6.4 oz (65 kg)   SpO2 96%   BMI 21.18 kg/m     Wt Readings from Last 3 Encounters:  10/14/24 143 lb 6.4 oz (65 kg)  10/12/24 139 lb 8 oz (63.3 kg)  10/08/24 149 lb 3.2 oz (67.7 kg)     GEN:  Well nourished, well developed elderly male in no acute distress HEENT: Normal NECK: No JVD; No carotid bruits LYMPHATICS: No lymphadenopathy CARDIAC: RRR, soft ejection murmur at the right upper sternal border RESPIRATORY:  Clear to auscultation without rales, wheezing or rhonchi  ABDOMEN: Soft, non-tender, non-distended MUSCULOSKELETAL:  No edema; No deformity  SKIN: Warm and dry NEUROLOGIC:  Alert and oriented x 3 PSYCHIATRIC:  Normal affect   Assessment & Plan S/P TAVR (transcatheter aortic valve replacement) Mean gradient 8 mmHg, no paravalvular regurgitation.  Patient doing very well.  He has had no change in his EKG and continues to have a normal PR interval with right bundle branch block.  He will have 30-day follow-up with an echocardiogram per protocol. RBBB Clinically stable.  No change in conduction status post TAVR. Chronic HFrEF (heart failure with reduced ejection fraction) (HCC) LVEF 35 to 40% on most recent echo, but  clinically improved following TAVR.  Advised him that we would review his medical program at the time of his repeat echo in a few weeks.  If he continues to have LV dysfunction, we will consider an SGLT2 inhibitor.  For now we will continue losartan .  Avoid beta-blocker with his conduction disease.  Reduce furosemide  to 20 mg daily. Coronary artery disease involving native coronary artery of native heart without angina pectoris Patient is status post high risk PCI of left main/ostial LAD before TAVR.  He has residual RCA stenosis with plans for medical therapy unless recurrent angina occurs.  Continue aspirin  and clopidogrel .            Medication Adjustments/Labs and Tests Ordered: Current medicines are reviewed at length with the patient today.  Concerns regarding medicines are outlined above.  Orders Placed This Encounter  Procedures   EKG 12-Lead   Meds ordered this encounter  Medications   furosemide  (LASIX ) 20 MG tablet    Sig: Take 1 tablet (20 mg total) by mouth daily.    Dispense:  90 tablet    Refill:  3    Patient Instructions  Medication Instructions:  Please DECREASE your dose of lasix  to 20 mg daily.  *If you need a refill on your cardiac medications before your next appointment, please call your pharmacy*  Lab Work: None.  If you have labs (blood work) drawn today and your tests are completely normal, you will receive your results only by: MyChart Message (if you have MyChart) OR A paper copy in the mail If you have any lab test that is abnormal or we need to change your treatment, we will call you to review the results.  Testing/Procedures: None.  Follow-Up: At [facility], you and your health needs are our priority.  As part of our continuing mission to provide you with exceptional heart care, our providers are all part of one team.  This team includes your primary Cardiologist (physician) and Advanced Practice Providers or APPs (Physician  Assistants and Nurse Practitioners) who all work together to provide you with the care you need, when you need it.        Signed, [name], MD  10/14/2024 2:31 PM    [facility]     [1]  Current Meds  Medication Sig   acetaminophen  (TYLENOL ) 500 MG tablet Take 1,000 mg by mouth every 6 (six) hours as needed for mild pain (pain score 1-3) or headache.   amiodarone  (PACERONE ) 200 MG tablet Take 1 tablet (200 mg total) by mouth daily.   aspirin  EC 81 MG tablet Take 1 tablet (81 mg total) by mouth daily. Swallow whole.   atorvastatin  (LIPITOR) 40 MG tablet Take 1 tablet (40 mg total) by mouth daily.   clopidogrel  (PLAVIX ) 75 MG tablet Take 1 tablet (75 mg total) by mouth daily.   fish oil-omega-3 fatty acids  1000 MG capsule Take 1 g by mouth daily.   levothyroxine  (SYNTHROID ) 88 MCG tablet Take 1 tablet (88 mcg total) by mouth daily before breakfast.   losartan  (COZAAR ) 25 MG tablet Take 1 tablet (25 mg total) by mouth daily.   Multiple Vitamins-Minerals (CENTRUM SILVER MEN 50+) TABS Take 1 tablet by mouth in the morning.   potassium chloride  SA (KLOR-CON  M) 20 MEQ tablet Take 1 tablet (20 mEq total) by mouth daily.   [DISCONTINUED] furosemide  (LASIX ) 40 MG tablet Take 1 tablet (40 mg total) by mouth daily.

## 2024-10-14 NOTE — Assessment & Plan Note (Addendum)
 Patient is status post high risk PCI of left main/ostial LAD before TAVR.  He has residual RCA stenosis with plans for medical therapy unless recurrent angina occurs.  Continue aspirin  and clopidogrel .

## 2024-10-14 NOTE — Patient Instructions (Signed)
 Medication Instructions:  Please DECREASE your dose of lasix  to 20 mg daily.  *If you need a refill on your cardiac medications before your next appointment, please call your pharmacy*  Lab Work: None.  If you have labs (blood work) drawn today and your tests are completely normal, you will receive your results only by: MyChart Message (if you have MyChart) OR A paper copy in the mail If you have any lab test that is abnormal or we need to change your treatment, we will call you to review the results.  Testing/Procedures: None.  Follow-Up: At [facility], you and your health needs are our priority.  As part of our continuing mission to provide you with exceptional heart care, our providers are all part of one team.  This team includes your primary Cardiologist (physician) and Advanced Practice Providers or APPs (Physician Assistants and Nurse Practitioners) who all work together to provide you with the care you need, when you need it.

## 2024-10-15 ENCOUNTER — Telehealth: Payer: Self-pay | Admitting: Cardiovascular Disease

## 2024-10-15 MED ORDER — POTASSIUM CHLORIDE CRYS ER 20 MEQ PO TBCR: 20.0000 meq | EXTENDED_RELEASE_TABLET | Freq: Every day | ORAL | 3 refills | Status: DC

## 2024-10-15 MED ORDER — ATORVASTATIN CALCIUM 40 MG PO TABS: 40.0000 mg | ORAL_TABLET | Freq: Every day | ORAL | 3 refills | Status: AC

## 2024-10-15 MED ORDER — CLOPIDOGREL BISULFATE 75 MG PO TABS: 75.0000 mg | ORAL_TABLET | Freq: Every day | ORAL | 3 refills | Status: AC

## 2024-10-15 NOTE — Telephone Encounter (Signed)
 Pt c/o medication issue:  1. Name of Medication:   atorvastatin  (LIPITOR) 40 MG tablet    2. How are you currently taking this medication (dosage and times per day)?    3. Are you having a reaction (difficulty breathing--STAT)?   4. What is your medication issue?   Patient says he is running out of medication and would like to know if he needs to continue on Atorvastatin  or go back on Pravastatin  before requesting a refill.

## 2024-10-15 NOTE — Telephone Encounter (Signed)
 Patient identification verified by 2 forms.  Spoke with pt regarding his recent change in medication from Pravastatin  to atorvastatin . Explained to pt that because his LDL was at 76 while in the hospital, pravastatin  was changed to atorvastatin  40mg  daily to achieve lower LDL goal. Pt verbalize understand, however he is a bit frustrated because he has a new bottle of pravastatin . Additional medications reviewed with pt. Pt and his daughter are clear on all cardiology prescribed medications. Appropriate refills sent for pt. Reviewed upcoming appointments with pt. No further questions at this time.

## 2024-10-21 ENCOUNTER — Other Ambulatory Visit: Payer: Self-pay

## 2024-10-21 ENCOUNTER — Telehealth: Payer: Self-pay

## 2024-10-21 NOTE — Transitions of Care (Post Inpatient/ED Visit) (Signed)
 Transition of Care week 3  Visit Note  10/21/2024  Name: [name] MRN: [id number]          DOB: 05-Dec-1929  Situation: Patient enrolled in [facility] 30-day program. Visit completed with patient by telephone.   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 Wonda and Dr. Daniel   Thyroid  disease    hypothyroidism    Assessment: Patient Reported Symptoms: Cognitive Cognitive Status: Able to follow simple commands, Alert and oriented to person, place, and time, Insightful and able to interpret abstract concepts, Normal speech and language skills      Neurological Neurological Review of Symptoms: No symptoms reported Neurological Management Strategies: Routine screening  HEENT HEENT Symptoms Reported: No symptoms reported      Cardiovascular Cardiovascular Symptoms Reported: No symptoms reported Does patient have uncontrolled Hypertension?: No Cardiovascular Management Strategies: Medication therapy, Routine screening Weight: 140 lb (63.5 kg) (Has monitoring scale by [employer] with [employer] daily) Cardiovascular Self-Management Outcome: 4 (good)  Respiratory Respiratory Symptoms Reported: No symptoms reported Other Respiratory Symptoms: much better Respiratory Management Strategies: Breathing techniques, Medication therapy, Routine screening Respiratory Self-Management Outcome: 4 (good)  Endocrine Endocrine Symptoms Reported: No symptoms reported    Gastrointestinal Gastrointestinal Symptoms Reported: No symptoms  reported (Took Immodium a couple of days ago and no more problems normal BM this morning) Additional Gastrointestinal Details: better Gastrointestinal Management Strategies: Diet modification, Medication therapy    Genitourinary Genitourinary Symptoms Reported: Frequency Additional Genitourinary Details: diuresing still Genitourinary Management Strategies: Medication therapy  Integumentary Integumentary Symptoms Reported: Incision, Wound Additional Integumentary Details: states both are healed Skin Management Strategies: Routine screening, Medication therapy Skin Self-Management Outcome: 4 (good)  Musculoskeletal   Musculoskeletal Management Strategies: Routine screening      Psychosocial Psychosocial Symptoms Reported: No symptoms reported         Today's Vitals   10/21/24 1303  BP: (!) 106/48  Weight: 140 lb (63.5 kg)   Pain Scale: 0-10 Pain Score: 0-No pain  Medications Reviewed Today     Reviewed by Eilleen Richerd GRADE, RN (Registered Nurse) on 10/21/24 at 1306  Med List Status: <None>   Medication Order Taking? Sig Documenting Provider Last Dose Status Informant  acetaminophen  (TYLENOL ) 500 MG tablet [id number] Yes Take 1,000 mg by mouth every 6 (six) hours as needed for mild pain (pain score 1-3) or headache. [provider]  Active Self, Pharmacy Records  amiodarone  (PACERONE ) 200 MG tablet [id number] Yes Take 1 tablet (200 mg total) by mouth daily. Sebastian Lamarr JONELLE DEVONNA  Active   aspirin  EC 81 MG tablet [id number] Yes Take 1 tablet (81 mg total) by mouth daily. Swallow whole. [name], MD  Active Self, Pharmacy Records  atorvastatin  (LIPITOR) 40 MG tablet [id number] Yes Take 1 tablet (40 mg total) by mouth daily. Wonda Sharper, MD  Active   azelastine  (ASTELIN ) 0.1 % nasal spray [id number] Yes Place 1 spray into both nostrils 2 (two) times daily. Use in each nostril as directed Nche, [name],  NP  Active   clopidogrel  (PLAVIX ) 75 MG tablet  [id number] Yes Take 1 tablet (75 mg total) by mouth daily. Wonda Sharper, MD  Active   fish oil-omega-3 fatty acids  1000 MG capsule [id number] Yes Take 1 g by mouth daily. [provider]  Active Self, Pharmacy Records  furosemide  (LASIX ) 20 MG tablet [id number] Yes Take 1 tablet (20 mg total) by mouth daily. Wonda Sharper, MD  Active   levothyroxine  (SYNTHROID ) 88 MCG tablet [id number] Yes Take 1 tablet (88 mcg total) by mouth daily before breakfast. Nche, [name], NP  Active Self, Pharmacy Records  losartan  (COZAAR ) 25 MG tablet [id number] Yes Take 1 tablet (25 mg total) by mouth daily. Sebastian Lamarr SAUNDERS, PA-C  Active   Multiple Vitamins-Minerals (CENTRUM SILVER MEN 50+) TABS [id number] Yes Take 1 tablet by mouth in the morning. [provider]  Active Self, Pharmacy Records  potassium chloride  SA (KLOR-CON  M) 20 MEQ tablet [id number] Yes Take 1 tablet (20 mEq total) by mouth daily. Wonda Sharper, MD  Active             Recommendation:   Continue Current Plan of Care  Follow Up Plan:   Telephone follow-up 11/09/24 at 1:00 Pm after getting back from Christmas/Birthday trip  Richerd Fish, CHARITY FUNDRAISER, SCIENTIST, RESEARCH (PHYSICAL SCIENCES), CCM [employer], [facility] Management Coordinator Direct Dial: [phone]

## 2024-10-21 NOTE — Patient Instructions (Addendum)
 Visit Information  Thank you for taking time to visit with me today. Please don't hesitate to contact me if I can be of assistance to you before our next scheduled telephone appointment.  Our next appointment is by telephone on 11/09/24 at 1300  Following is a copy of your care plan:   Goals Addressed             This Visit's Progress    [facility] Transitions of Care (TOC) Care Plan   On track    Problems:  Recent Hospitalization for treatment of CHF and s/p TAVR - Zio patch Heart Failure SPECIAL INSTRUCTIONS AVOID STRAINING STOP ANY ACTIVITY THAT CAUSES CHEST PAIN, SHORTNESS OF BREATH, DIZZINESS, SWEATING, OR EXCESSIVE WEAKNESS. SPECIAL INSTRUCTIONS FOR PATIENTS WITH HEART FAILURE: Continue to follow the instructions in your Heart Failure Patient Education information that you received during your hospital stay. Record daily weight on same scale at same time of day. If your doctor did not discuss your diet or activity in the information above, please follow a Heart Healthy Low Sodium diet and increase your activity as you feel able. Call your doctor: (Anytime you feel any of the following symptoms) 3-4 pound weight gain in 1-2 days or 2 pounds overnight Shortness of breath, with or without a dry hacking cough Swelling in the hands, feet or stomach If you have to sleep on extra pillows at night in order to breathe Patient with TAVR and Zio Patch encouraged to call Dr. Wonda for any questions, patient denies any symptoms states he feels much better since procedure. Patient encouraged to discuss sacral wound noted on photos, to address treatment options as well as [facility] RN reviewed 10/12/24 patient states he feels it's healed pretty much without concerns. 10/12/24 Review of hospital follow up and labs tomorrow. Patient planning to fly to [place]  10/26/24 - 11/08/24. Encouraged patient to let providers know especially cardiologist for clearance and precautions for traveling needs. 10/21/24 Reviewed plan of  care with patient and daughter [name]. Patient states he has medications and equipment for traveling during the holiday and desires follow up when he return. Patient states he has a monitoring system from [employer] that sends information back to their team with his BP, weights, and EKG sensor. Reviewed taking necessary equipment with him. Patient verbalizes understanding  Goal:  Over the next 30 days, the patient will not experience hospital readmission  Interventions:  Transitions of Care: Doctor Visits  - discussed the importance of doctor visits  Heart Failure Interventions: Provided education on low sodium diet Advised patient to weigh each morning after emptying bladder Discussed importance of daily weight and advised patient to weigh and record daily Reviewed role of diuretics in prevention of fluid overload and management of heart failure; Discussed the importance of keeping all appointments with provider Screening for signs and symptoms of depression related to chronic disease state  10/12/24 Patient is weighing daily and has electronic scales with information sent back to [facility] and gets weekly calls from [facility] as well. Review of systems and patient progressing able to tolerate activities without shortness of breath he state. 10/21/24 Patient is progressing, also states continues to void a great deal. Has a better understanding of his change in his statin. He has his medications refilled as needed for his trip.  Patient Self Care Activities:  Attend all scheduled provider appointments Call pharmacy for medication refills 3-7 days in advance of running out of medications Call provider office for new concerns or questions  Notify RN Care Manager of TOC call rescheduling needs Participate in Transition of Care Program/Attend TOC scheduled calls Take medications as prescribed   10/12/24 Discussed having enough medications for his Christmas trip planned. Daughter in the  background states she writing information down to discuss at upcoming appointments. 10/21/24 Reviewed safety and medical needs for upcoming trip.  Patient is currently walking 4 blocks without problems he states.  Plan:  The patient has been provided with contact information for the care management team and has been advised to call with any health related questions or concerns.  10/07/24 Discussed and offered 30 day TOC program.  Patient agrees to weekly telephonic calls and can cancel at any time.  The patient has been provided with contact information for the care management team and has been advised to call with any health -related questions or concerns.  The patient verbalized understanding with current plan of care.  The patient is directed to their insurance card regarding availability of benefits coverage.  Follow up next week for ongoing care needs with this clinical research associate. 10/21/24. 10/21/24 Patient would like to follow up when he returns from his Christmas trip and birthday on 11/10/23. Patient states he knows who to contact if medical needs arises. Reviewed for needs.        Patient verbalizes understanding of instructions and care plan provided today and agrees to view in MyChart. Active MyChart status and patient understanding of how to access instructions and care plan via MyChart confirmed with patient.     The patient has been provided with contact information for the care management team and has been advised to call with any health related questions or concerns.   Please call the care guide team at [phone] if you need to cancel or reschedule your appointment.   Please call the [place]  National Suicide Prevention Lifeline: (610)320-3542 or TTY: 515-542-7238 TTY 585-288-8449) to talk to a trained counselor if you are experiencing a Mental Health or Behavioral Health Crisis or need someone to talk to.  Richerd Fish, RN, BSN, CCM [facility], [facility] Management Coordinator Direct Dial: [phone]

## 2024-10-22 ENCOUNTER — Telehealth: Payer: Self-pay

## 2024-10-22 ENCOUNTER — Telehealth: Payer: Self-pay | Admitting: Cardiovascular Disease

## 2024-10-22 ENCOUNTER — Other Ambulatory Visit: Payer: Self-pay

## 2024-10-22 ENCOUNTER — Ambulatory Visit: Attending: Cardiovascular Disease

## 2024-10-22 DIAGNOSIS — Z952 Presence of prosthetic heart valve: Secondary | ICD-10-CM

## 2024-10-22 DIAGNOSIS — R001 Bradycardia, unspecified: Secondary | ICD-10-CM

## 2024-10-22 NOTE — Telephone Encounter (Signed)
 Spoke with pt and let him know that Dr. Wonda had recommended a ZIO AT 14 day monitor. Explained that this monitor will be mailed to his address, pt verbalized understanding of plan. Pt stated [place]. Advised pt to get evaluated by ED while traveling if he starts having symptoms such as lightheadedness, lethargy, shortness of breath, chest pain, etc if he doesn't get his monitor on prior to leaving. Pt verbalized understanding of plan and had no further questions or concerns at this time.

## 2024-10-22 NOTE — Telephone Encounter (Signed)
 Katie (monitor tech) made us  aware that the pt just had a heart monitor on 12/3 s/p TAVR and sent back the monitor a few days ago. After speaking with Dr. Wonda, we will have the pt come in on Monday 12/22 to get an EKG done. Pt is aware of this plan and will be coming in for a nurse visit at 10 AM on 12/22.

## 2024-10-22 NOTE — Telephone Encounter (Signed)
 Called Jilissa back and gave the verbal orders of discharge per Tinnie Harada, NP She thanked me for calling

## 2024-10-22 NOTE — Telephone Encounter (Signed)
 Office would like to speak to Dr Wonda about this patient possibly have a heart block after Tavar. Dr Sung would like for pt to come in to have a 12 lead EKG to rule this out. Please advise

## 2024-10-22 NOTE — Telephone Encounter (Signed)
 Spoke with Madison at [employer]. She reports patient was seen by Dr. Sung and patient has been having consistently low HR in the 40's, she states he is asymptomatic, but the provider is concerned about possible heart block following TAVR procedure.  Their office performed a single lead EKG which Madison states is not definitive and the recommendation from Dr. Sung is to have our office perform a 12 lead EKG or order a Zio patch to rule out heart block.  Informed Madison I will forward this message to Dr. Wonda to review and see what he would like to do and we will follow-up with his recommendations.  Madison states she will be faxing EKG obtained at their office today to our office.

## 2024-10-22 NOTE — Telephone Encounter (Signed)
 ZIO AT 14 days ordered per Dr. Wonda

## 2024-10-22 NOTE — Telephone Encounter (Signed)
 Called and spoke with Jilissa the nurse from [facility]. She is wanting verbal orders for patient to be released from Skilled nursing home health. Patient did not have a wound is able to self ambulate, has virtual visit with doctor family of CNA's and nurses that help monitor blood pressures when patient is not able to check BP himself. Informed that I will send to Tinnie Harada, NP to review.

## 2024-10-22 NOTE — Telephone Encounter (Signed)
 Copied from CRM #[id number]. Topic: General - Other >> Oct 20, 2024  3:00 PM Alexandria E wrote: Reason for CRM: Jillis, nurse with [facility], called in needing verbal orders for the patient to be discharged from skilled nursing. Jillis stated the patient is doing great. Callback number [phone].

## 2024-10-22 NOTE — Progress Notes (Unsigned)
Enrolled patient for a 7 day Zio AT monitor to be mailed to patients home  

## 2024-10-25 ENCOUNTER — Ambulatory Visit: Attending: Cardiology | Admitting: Cardiology

## 2024-10-25 VITALS — BP 112/62 | HR 42 | Ht 69.0 in | Wt 145.0 lb

## 2024-10-25 DIAGNOSIS — R001 Bradycardia, unspecified: Secondary | ICD-10-CM

## 2024-10-25 NOTE — Patient Instructions (Addendum)
 Medication Instructions:  HOLD Amiodarone  until follow up with Dr. Wonda *If you need a refill on your cardiac medications before your next appointment, please call your pharmacy*  Lab Work: None ordered If you have labs (blood work) drawn today and your tests are completely normal, you will receive your results only by: MyChart Message (if you have MyChart) OR A paper copy in the mail If you have any lab test that is abnormal or we need to change your treatment, we will call you to review the results.  Testing/Procedures: ZIO AT monitor  Follow-Up: At [facility], you and your health needs are our priority.  As part of our continuing mission to provide you with exceptional heart care, our providers are all part of one team.  This team includes your primary Cardiologist (physician) and Advanced Practice Providers or APPs (Physician Assistants and Nurse Practitioners) who all work together to provide you with the care you need, when you need it.  Your next appointment:   1 month(s)  Provider:   Ozell Wonda, MD    We recommend signing up for the patient portal called MyChart.  Sign up information is provided on this After Visit Summary.  MyChart is used to connect with patients for Virtual Visits (Telemedicine).  Patients are able to view lab/test results, encounter notes, upcoming appointments, etc.  Non-urgent messages can be sent to your provider as well.   To learn more about what you can do with MyChart, go to [email or web address].

## 2024-10-25 NOTE — Telephone Encounter (Unsigned)
 Copied from CRM #[id number]. Topic: Clinical - Home Health Verbal Orders >> Oct 25, 2024  9:33 AM Chasity T wrote: Caller/Agency: [facility]ritanny - [facility] Callback Number: [phone] Service Requested: discharge from nursing, patient request Frequency: n/a Any new concerns about the patient? No

## 2024-10-25 NOTE — Telephone Encounter (Addendum)
 Called Britany back and advise of the following message:Dm/cma   Called Jillisa back and gave the verbal orders of discharge per Tinnie Harada, NP She thanked me for calling

## 2024-10-25 NOTE — Progress Notes (Signed)
 Increased SOB over the last couple of months. Walks 4 blocks 6-7 times per week. Has recently had to stop a spin class due to fatigue and SOB. Per Dr. Elmira, Continue with plan to wear 2 week Live ZIO. Keep follow up with Dr. Wonda in January.

## 2024-10-25 NOTE — Addendum Note (Signed)
 Encounter addended by: Luvern Mischke N on: 10/25/2024 11:02 AM  Actions taken: Imaging Exam ended

## 2024-10-25 NOTE — Progress Notes (Signed)
 EKG 10/25/2024: Marked sinus bradycardia with Blocked Premature atrial complexes Right bundle branch block When compared with ECG of 14-Oct-2024 12:17, Premature atrial complexes are now Present QRS axis Shifted left Nonspecific T wave abnormality has replaced inverted T waves in Anterior leads    Marked sinus bradycardia and sinus arrhythmia, but no clear new AV conduction abnormality, has underlying right bundle branch block.  Patient is now wearing 2-week live Zio patch monitor, continue the same.  Reasonable to hold amiodarone  for now.  Has follow-up with Dr. Wonda in 2 weeks.  Newman JINNY Lawrence, MD

## 2024-10-29 ENCOUNTER — Ambulatory Visit: Payer: Self-pay | Admitting: Cardiovascular Disease

## 2024-10-29 DIAGNOSIS — I4510 Unspecified right bundle-branch block: Secondary | ICD-10-CM | POA: Diagnosis not present

## 2024-10-29 DIAGNOSIS — Z952 Presence of prosthetic heart valve: Secondary | ICD-10-CM

## 2024-11-01 ENCOUNTER — Ambulatory Visit: Payer: Self-pay

## 2024-11-01 ENCOUNTER — Telehealth: Payer: Self-pay | Admitting: Nurse Practitioner

## 2024-11-01 ENCOUNTER — Encounter: Payer: Self-pay | Admitting: Cardiovascular Disease

## 2024-11-01 NOTE — Telephone Encounter (Signed)
 document via Fax, to be filled out by provider. requested to send it back via Fax within ASAP. Document is located in providers tray at front office.

## 2024-11-01 NOTE — Telephone Encounter (Signed)
 PT called office and was instructed to call his PCP

## 2024-11-01 NOTE — Telephone Encounter (Signed)
" °  FYI Only or Action Required?: Action required by provider: clinical question for provider.  Patient was last seen in primary care on 10/08/2024 by Nche, [name], NP.  Called Nurse Triage reporting Covid Positive.  Symptoms began yesterday.  Interventions attempted: OTC medications: Tylenol .  Symptoms are: unchanged. COVID positive yesterday. Cough, runny nose, achy, chills. In [place]  with family. Asking for Paxlovid to be sent to his local [facility]. Please advise pt.  Triage Disposition: Call PCP Within 24 Hours  Patient/caregiver understands and will follow disposition?: Yes      Copied from CRM #[id number]. Topic: Clinical - Red Word Triage >> Nov 01, 2024  1:24 PM Rea ORN wrote: Red Word that prompted transfer to Nurse Triage: Pt had positive covid test yesterday. His current sx are productive cough, runny nose, body aches, chills. Pt is asking if PCP can prescribe rx. Reason for Disposition  [1] HIGH RISK patient (e.g., weak immune system, 65 years and older, obesity with BMI 30 or higher, pregnant, chronic lung disease) AND [2] COVID symptoms (e.g., cough, fever)  (Exceptions: Already seen by PCP and no new or worsening symptoms.)  Answer Assessment - Initial Assessment Questions 1. SYMPTOMS: What is your main symptom or concern? (e.g., cough, fever, shortness of breath, muscle aches)     COUGH,RUNNY NOSE,chills 2. ONSET: When did the symptoms start?      yesterday 3. COUGH: Do you have a cough? If Yes, ask: How bad is the cough?       yes 4. FEVER: Do you have a fever? If Yes, ask: What is your temperature, how was it measured, and when did it start?     chills 5. BREATHING DIFFICULTY: Are you having any difficulty breathing? (e.g., normal; shortness of breath, wheezing, unable to speak)      no 6. BETTER-SAME-WORSE: Are you getting better, staying the same or getting worse compared to yesterday?  If getting worse, ask, In what way?     same 7. OTHER  SYMPTOMS: Do you have any other symptoms?  (e.g., chills, fatigue, headache, loss of smell or taste, muscle pain, sore throat)     mucus 8. COVID-19 DIAGNOSIS: How do you know that you have COVID? (e.g., positive lab test or self-test, diagnosed by doctor or NP/PA, symptoms after exposure).     Home test 9. COVID-19 EXPOSURE: Was there any known exposure to COVID before the symptoms began?      yes 10. COVID-19 VACCINE: Have you had the COVID-19 vaccine? If Yes, ask: When did you last get it?       N/a 11. HIGH RISK DISEASE: Do you have any chronic medical problems? (e.g., asthma, heart or lung disease, weak immune system, obesity, etc.)       age 89. PREGNANCY: Is there any chance you are pregnant? When was your last menstrual period?       N/a 13. O2 SATURATION MONITOR:  Do you use an oxygen saturation monitor (pulse oximeter) at home? If Yes, ask What is your reading (oxygen level) today? What is your usual oxygen saturation reading? (e.g., 95%)       no  Protocols used: COVID-19 - Diagnosed or Suspected-A-AH  "

## 2024-11-02 ENCOUNTER — Other Ambulatory Visit: Payer: Self-pay | Admitting: *Deleted

## 2024-11-02 NOTE — Telephone Encounter (Signed)
 Called patient to let him know, per Dedra, he would need ov. before meds could be called in.  He is in [place]  for 6 more days. Pt was advised to go to urgent care in that area. He stated he will just wait out the virus.

## 2024-11-03 NOTE — Telephone Encounter (Signed)
 Called and left a voice message asking to give me a call.   Provider is out of office want to ask if it's okay that another provider signs the paperwork

## 2024-11-05 NOTE — Telephone Encounter (Signed)
 Forms signed by Dr. Prentiss and faxed to [facility]

## 2024-11-09 ENCOUNTER — Telehealth: Payer: Self-pay

## 2024-11-10 ENCOUNTER — Telehealth: Payer: Self-pay

## 2024-11-10 NOTE — Transitions of Care (Post Inpatient/ED Visit) (Signed)
 " Transition of Care Week 5  Visit Note  11/10/2024  Name: [name] MRN: [id number]          DOB: [birth year]  Situation: Patient enrolled in [facility] 30-day program. Visit completed with patient by telephone.   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 Wonda and Dr. Daniel   Thyroid  disease    hypothyroidism    Assessment: Patient Reported Symptoms: Cognitive Cognitive Status: Able to follow simple commands, Alert and oriented to person, place, and time, Normal speech and language skills      Neurological Neurological Review of Symptoms: No symptoms reported Neurological Management Strategies: Routine screening  HEENT HEENT Symptoms Reported: No symptoms reported HEENT Self-Management Outcome: 3 (uncertain)    Cardiovascular Cardiovascular Symptoms Reported: No symptoms reported Does patient have uncontrolled Hypertension?: No Cardiovascular Management Strategies: Medication therapy, Routine screening Cardiovascular Self-Management Outcome: 4 (good) Cardiovascular Comment: Getting things set back up since back in town; flight was delayed an extra day  Respiratory Respiratory Symptoms Reported: No symptoms reported Other Respiratory Symptoms: Had COVID while on trip Respiratory Management Strategies: Adequate rest, Medication therapy, Routine screening Respiratory Self-Management Outcome: 4 (good)  Endocrine Endocrine Symptoms Reported: No symptoms reported Is patient diabetic?: No Endocrine Self-Management Outcome: 4 (good)  Gastrointestinal  Gastrointestinal Symptoms Reported: No symptoms reported Additional Gastrointestinal Details: better Gastrointestinal Management Strategies: Diet modification, Medication therapy Gastrointestinal Self-Management Outcome: 3 (uncertain)    Genitourinary Genitourinary Symptoms Reported: Frequency Genitourinary Management Strategies: Medication therapy  Integumentary Integumentary Symptoms Reported: No symptoms reported Additional Integumentary Details: all healed but back and buttocks was a little sore feeling, cause I did ride alot Skin Management Strategies: Routine screening Skin Self-Management Outcome: 3 (uncertain)  Musculoskeletal Musculoskelatal Symptoms Reviewed: No symptoms reported Musculoskeletal Management Strategies: Medication therapy Musculoskeletal Self-Management Outcome: 4 (good)      Psychosocial Psychosocial Symptoms Reported: No symptoms reported Behavioral Health Self-Management Outcome: 4 (good)       There were no vitals filed for this visit. Pain Scale: 0-10 Pain Score: 1  Pain Location: Back Pain Descriptors / Indicators: Sore  Medications Reviewed Today     Reviewed by Eilleen Richerd GRADE, RN (Registered Nurse) on 11/10/24 at 1357  Med List Status: <None>   Medication Order Taking? Sig Documenting Provider Last Dose Status Informant  acetaminophen  (TYLENOL ) 500 MG tablet [id number] Yes Take 1,000 mg by mouth every 6 (six) hours as needed for mild pain (pain score 1-3) or headache. [provider]  Active Self, Pharmacy Records  amiodarone  (PACERONE ) 200 MG tablet [id number]  Take 1 tablet (200 mg total) by mouth daily.  Patient not taking: Reported on 10/25/2024   Sebastian Lamarr JONELLE DEVONNA  Active   aspirin  EC 81 MG tablet [id number] Yes Take 1 tablet (81 mg total) by mouth daily. Swallow whole. [name], MD  Active Self, Pharmacy Records  atorvastatin  (LIPITOR) 40 MG tablet [id number] Yes Take 1 tablet (40 mg total) by mouth daily. Wonda Sharper, MD  Active   azelastine  (ASTELIN ) 0.1 %  nasal spray [id number] Yes Place 1 spray into both nostrils 2 (two) times daily. Use in each nostril as directed Nche, [name], NP  Active   clopidogrel  (PLAVIX ) 75 MG tablet [id number] Yes Take 1 tablet (75 mg total) by mouth daily. Wonda Sharper, MD  Active   fish oil-omega-3 fatty acids  1000 MG capsule [id number]  Take 1 g by mouth daily. [provider]  Active Self, Pharmacy Records  furosemide  (LASIX ) 20 MG tablet [id number] Yes Take 1 tablet (20 mg total) by mouth daily. Wonda Sharper, MD  Active   levothyroxine  (SYNTHROID ) 88 MCG tablet [id number] Yes Take 1 tablet (88 mcg total) by mouth daily before breakfast. Nche, [name], NP  Active Self, Pharmacy Records  losartan  (COZAAR ) 25 MG tablet [id number] Yes Take 1 tablet (25 mg total) by mouth daily. Sebastian Lamarr SAUNDERS, PA-C  Active   Multiple Vitamins-Minerals (CENTRUM SILVER MEN 50+) TABS [id number]  Take 1 tablet by mouth in the morning. [provider]  Active Self, Pharmacy Records  potassium chloride  SA (KLOR-CON  M) 20 MEQ tablet [id number] Yes Take 1 tablet (20 mEq total) by mouth daily. Wonda Sharper, MD  Active            Care gaps were reviewed with patient today and patient verbalized understanding to follow up with primary care provider during office visits and discuss any concerns. Patient just had new COVID diagnosis as well.  Recommendation:   Completed 30 day program, offered longitudinal CCM and currently declines.  Follow Up Plan:   Closing From:  Transitions of Care Program Patient has met all care management goals. Care Management case will be closed. Patient has been provided contact information should new needs arise.  Patient assures that he know who to contact for ongoing needs.  Richerd Fish, RN, BSN, CCM [facility], [facility] Management Coordinator Direct Dial: [phone]            "

## 2024-11-10 NOTE — Patient Instructions (Addendum)
 Visit Information  Thank you for taking time to visit with me today. Please don't hesitate to contact me if I can be of assistance to you before our next scheduled telephone appointment.  *Patient completed 30 day program and follow up today  Following is a copy of your care plan:   Goals Addressed             This Visit's Progress    COMPLETED: [facility] Transitions of Care (TOC) Care Plan       Problems:  Recent Hospitalization for treatment of CHF and s/p TAVR - Zio patch Heart Failure SPECIAL INSTRUCTIONS AVOID STRAINING STOP ANY ACTIVITY THAT CAUSES CHEST PAIN, SHORTNESS OF BREATH, DIZZINESS, SWEATING, OR EXCESSIVE WEAKNESS. SPECIAL INSTRUCTIONS FOR PATIENTS WITH HEART FAILURE: Continue to follow the instructions in your Heart Failure Patient Education information that you received during your hospital stay. Record daily weight on same scale at same time of day. If your doctor did not discuss your diet or activity in the information above, please follow a Heart Healthy Low Sodium diet and increase your activity as you feel able. Call your doctor: (Anytime you feel any of the following symptoms) 3-4 pound weight gain in 1-2 days or 2 pounds overnight Shortness of breath, with or without a dry hacking cough Swelling in the hands, feet or stomach If you have to sleep on extra pillows at night in order to breathe Patient with TAVR and Zio Patch encouraged to call Dr. Wonda for any questions, patient denies any symptoms states he feels much better since procedure. Patient encouraged to discuss sacral wound noted on photos, to address treatment options as well as [facility] RN reviewed 10/12/24 patient states he feels it's healed pretty much without concerns. 10/12/24 Review of hospital follow up and labs tomorrow. Patient planning to fly to [place]  10/26/24 - 11/08/24. Encouraged patient to let providers know especially cardiologist for clearance and precautions for traveling needs. 10/21/24 Reviewed plan of  care with patient and daughter [name]. Patient states he has medications and equipment for traveling during the holiday and desires follow up when he return. Patient states he has a monitoring system from [employer] that sends information back to their team with his BP, weights, and EKG sensor. Reviewed taking necessary equipment with him. Patient verbalizes understanding. 11/10/24 Review of return home from holiday, patient had COVID but states, I just rode it out and I'm doing fine, only have a runny nose from time to time.  Goal:  Over the next 30 days, the patient will not experience hospital readmission - goal met  Interventions:  Transitions of Care: Doctor Visits  - discussed the importance of doctor visits  Heart Failure Interventions: Provided education on low sodium diet Advised patient to weigh each morning after emptying bladder Discussed importance of daily weight and advised patient to weigh and record daily Reviewed role of diuretics in prevention of fluid overload and management of heart failure; Discussed the importance of keeping all appointments with provider Screening for signs and symptoms of depression related to chronic disease state  10/12/24 Patient is weighing daily and has electronic scales with information sent back to [facility] and gets weekly calls from [place] as well. Review of systems and patient progressing able to tolerate activities without shortness of breath he state. 10/21/24 Patient is progressing, also states continues to void a great deal. Has a better understanding of his change in his statin. He has his medications refilled as needed for his trip. 11/10/24  Patient will follow up with cardiology has ECHO and Cardiology upcoming appointments.  Patient Self Care Activities:  Attend all scheduled provider appointments Call pharmacy for medication refills 3-7 days in advance of running out of medications Call provider office for new concerns or  questions  Notify RN Care Manager of [facility] call rescheduling needs Participate in Transition of Care Program/Attend TOC scheduled calls Take medications as prescribed   10/12/24 Discussed having enough medications for his Christmas trip planned. Daughter in the background states she writing information down to discuss at upcoming appointments. 10/21/24 Reviewed safety and medical needs for upcoming trip.  Patient is currently walking 4 blocks without problems he states. 11/10/24 Patient states he survived COVID while on holiday but denies any issues at this time.  Reviewed upcoming appointments and has no issues with getting to appointments as he still drives.  Plan:  The patient has been provided with contact information for the care management team and has been advised to call with any health related questions or concerns.  10/07/24 Discussed and offered 30 day TOC program.  Patient agrees to weekly telephonic calls and can cancel at any time.  The patient has been provided with contact information for the care management team and has been advised to call with any health -related questions or concerns.  The patient verbalized understanding with current plan of care.  The patient is directed to their insurance card regarding availability of benefits coverage.  Follow up next week for ongoing care needs with this clinical research associate. 10/21/24. 10/21/24 Patient would like to follow up when he returns from his Christmas trip and birthday on 11/10/23. Patient states he knows who to contact if medical needs arises. Reviewed for needs. 11/10/24 Patient offered Complex Care Management but declines and states he will seek out provider if needs arises for care management.         Patient verbalizes understanding of instructions and care plan provided today and agrees to view in MyChart. Active MyChart status and patient understanding of how to access instructions and care plan via MyChart confirmed with patient.     The  patient has been provided with contact information for the care management team and has been advised to call with any health related questions or concerns.  No further follow up required: Patient encouraged to contact PCP if new needs arises for care management follow up  Please call the care guide team at [phone] if you need to cancel or reschedule your appointment.   Please call the [place]  National Suicide Prevention Lifeline: 872-182-4523 or TTY: 5861032495 TTY (204)849-9748) to talk to a trained counselor if you are experiencing a Mental Health or Behavioral Health Crisis or need someone to talk to.  Richerd Fish, RN, BSN, CCM [facility], [facility] Management Coordinator Direct Dial: [phone]

## 2024-11-15 ENCOUNTER — Ambulatory Visit (HOSPITAL_COMMUNITY)
Admission: RE | Admit: 2024-11-15 | Discharge: 2024-11-15 | Disposition: A | Discharge status: Home / Self Care | Source: Ambulatory Visit | Attending: Internal Medicine | Admitting: Internal Medicine

## 2024-11-15 DIAGNOSIS — Z952 Presence of prosthetic heart valve: Secondary | ICD-10-CM | POA: Diagnosis present

## 2024-11-15 LAB — ECHOCARDIOGRAM COMPLETE
AR max vel: 2.5 cm2
AV Area VTI: 2.36 cm2
AV Area mean vel: 2.71 cm2
AV Mean grad: 11 mmHg
AV Peak grad: 20.8 mmHg
Ao pk vel: 2.28 m/s
Area-P 1/2: 2 cm2
MV VTI: 2.36 cm2
P 1/2 time: 647 ms
S' Lateral: 4 cm

## 2024-11-16 ENCOUNTER — Ambulatory Visit: Payer: Self-pay | Admitting: Physician Assistant

## 2024-11-17 ENCOUNTER — Encounter: Payer: Self-pay | Admitting: Cardiovascular Disease

## 2024-11-17 ENCOUNTER — Ambulatory Visit: Attending: Cardiology | Admitting: Cardiovascular Disease

## 2024-11-17 VITALS — BP 108/40 | HR 67 | Ht 69.0 in | Wt 145.6 lb

## 2024-11-17 DIAGNOSIS — I2510 Atherosclerotic heart disease of native coronary artery without angina pectoris: Secondary | ICD-10-CM | POA: Diagnosis not present

## 2024-11-17 DIAGNOSIS — I5022 Chronic systolic (congestive) heart failure: Secondary | ICD-10-CM | POA: Diagnosis not present

## 2024-11-17 DIAGNOSIS — Z952 Presence of prosthetic heart valve: Secondary | ICD-10-CM | POA: Diagnosis not present

## 2024-11-17 NOTE — Assessment & Plan Note (Addendum)
 Recent echo reviewed for his 30-day follow-up study and this demonstrates moderate LV dysfunction with normal function of his TAVR prosthesis, mean gradient 11 mmHg and trivial paravalvular regurgitation.  Plan 1 year follow-up echo per protocol.

## 2024-11-17 NOTE — Assessment & Plan Note (Addendum)
 Status post left main stenting.  Continue aspirin  and clopidogrel .

## 2024-11-17 NOTE — Patient Instructions (Signed)
 Medication Instructions:  STOP Furosemide  (Lasix )   STOP Potassium   IF YOU HAVE 3 lbs weight gain overnight or 5 lbs weight gain in a week, restart your Lasix  and potassium and let our office know.  *If you need a refill on your cardiac medications before your next appointment, please call your pharmacy*  Lab Work: None ordered today. If you have labs (blood work) drawn today and your tests are completely normal, you will receive your results only by: MyChart Message (if you have MyChart) OR A paper copy in the mail If you have any lab test that is abnormal or we need to change your treatment, we will call you to review the results.  Testing/Procedures: None ordered today.  Follow-Up: At [facility], you and your health needs are our priority.  As part of our continuing mission to provide you with exceptional heart care, our providers are all part of one team.  This team includes your primary Cardiologist (physician) and Advanced Practice Providers or APPs (Physician Assistants and Nurse Practitioners) who all work together to provide you with the care you need, when you need it.  Your next appointment:   3 month(s)  Provider:   One of our Advanced Practice Providers (APPs): Morse Clause, PA-C  Lamarr Satterfield, NP Miriam Shams, NP  Olivia Pavy, PA-C Josefa Beauvais, NP  Leontine Salen, PA-C Orren Fabry, PA-C  [place], PA-C Ernest Dick, NP  Damien Braver, NP Jon Hails, PA-C  Waddell Donath, PA-C    Dayna Dunn, PA-C  Scott Weaver, PA-C Lum Louis, NP Katlyn West, NP Callie Goodrich, PA-C  Xika Zhao, NP Sheng Haley, PA-C    Kathleen Johnson, PA-C

## 2024-11-17 NOTE — Progress Notes (Signed)
 " Cardiology Office Note:    Date:  11/17/2024   ID:  [name], DOB 1930/04/09, MRN [id number]  PCP:  [name], NP   [facility] Providers Cardiologist:  [name], MD Structural Heart:  [name], MD    Referring MD: [name], NP   Chief Complaint  Patient presents with   Follow-up    S/P TAVR    History of Present Illness:    [name] is a 89 y.o. male presenting for follow-up of acute on chronic HFrEF in the setting of severe aortic stenosis. He was brought in 1 day prior to TAVR for right heart catheterization and medical optimization. He then underwent TAVR 10/05/2024 with a 23 mm Edwards SAPIEN 3 valve without complication. His postprocedure mean gradient is 8 mmHg with no paravalvular regurgitation. Preoperatively he had right bundle branch block but had no change in his heart rhythm with TAVR. He was discharged with a ZIO AT to evaluate for any late presenting AV block. Monitor showed sinus rhythm and sinus bradycardia with no high-grade AV block, one run of NSVT, and an average HR of 65 bpm. Echo 1/12 shows moderate LV dysfunction with LVEF 35-40%, normal TAVR function with mean gradient 11 mmHg and trivial PVL.  The patient is here alone today.  He is doing very well.  He complains of frequent urination with furosemide .  Otherwise he feels well.  He has been walking on a regular basis and was able to walk 1 mile yesterday without problems.  He denies orthopnea, PND, shortness of breath, or leg swelling.  No chest pain or pressure.  No lightheadedness or syncope.  Current Medications: Active Medications[1]   Allergies:   Patient has no known allergies.   ROS:   Please see the history of present illness.    All other systems reviewed and are negative.  EKGs/Labs/Other Studies Reviewed:    The following studies were reviewed today: Cardiac Studies & Procedures    ______________________________________________________________________________________________ CARDIAC CATHETERIZATION  CARDIAC CATHETERIZATION 10/04/2024  Conclusion HEMODYNAMICS: RA:       6 mmHg (mean) RV:       50/4, 6 mmHg PA:       50/22 mmHg (32 mean) PCWP: 22 mmHg (mean) with v waves to 30  Estimated Fick CO/CI   3.97L/min, 2.15L/min/m2 Thermodilution CO/CI   3.19L/min, 1.72L/min/m2  TPG  10  mmHg PVR  3.13 Wood Units PAPi  4.6   IMPRESSION: Right heart catheterization with leave in swan for optimization prior to TAVr Normal right, moderately elevated left heart filling pressures with significant v waves Moderately reduced cardiac index by TD, improved ectopy from prior RHC 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 Normal filling pressures with low cardiac output in setting of significant AS (Output drops while in bigeminy)  Plan/Discussion:  Patient pending TAVR. Next Tuesday. D/w Dr. Fell & Dr. Anner. Can consider increasing outpatient diuretics and admitting on Monday through cath lab for Swan and pre-TAVR optimization with DBA as an option.  Toribio Fuel, MD 8:51 AM     ECHOCARDIOGRAM  ECHOCARDIOGRAM COMPLETE 11/15/2024  Narrative ECHOCARDIOGRAM REPORT    Patient Name:   [name] [facility] Date of Exam: 11/15/2024 Medical Rec #:  [id number]          Height:       69.0 in Accession #:    [id number]         Weight:       145.0 lb Date of Birth:  02-01-1930         BSA:          1.802 m Patient Age:    89 years           BP:            112/62 mmHg Patient Gender: M                  HR:           55 bpm. Exam Location:  [address]  Procedure: 2D Echo, 3D Echo, Color Doppler, Cardiac Doppler and Strain Analysis (Both Spectral and Color Flow Doppler were utilized during procedure).  Indications:    Z95.2 S/P TAVR  History:        Patient has prior history of Echocardiogram examinations, most recent 10/06/2024. CHF, CAD; Risk Factors:Dyslipidemia and Diabetes. Aortic Valve: 23 mm Sapien prosthetic, stented (TAVR) valve is present in the aortic position.  Sonographer:    Powell Saras Referring Phys: [id number] KATHRYN R THOMPSON  IMPRESSIONS   1. Left ventricular ejection fraction, by estimation, is 35 to 40%. Left ventricular ejection fraction by 3D volume is 38 %. The left ventricle has moderately decreased function. The left ventricle demonstrates regional wall motion abnormalities (see scoring diagram/findings for description). There is mild asymmetric left ventricular hypertrophy of the basal-septal segment. Left ventricular diastolic parameters are consistent with Grade I diastolic dysfunction (impaired relaxation). The average left ventricular global longitudinal strain is -13.9 %. The global longitudinal strain is abnormal. 2. Right ventricular systolic function is normal. The right ventricular size is normal. 3. Left atrial size was moderately dilated. 4. Right atrial size was mildly dilated. 5. The mitral valve is degenerative. Mild mitral valve regurgitation. No evidence of mitral stenosis. 6. The aortic valve has been repaired/replaced. Aortic valve regurgitation is trivial. No aortic stenosis is present. There is a 23 mm Sapien prosthetic (TAVR) valve present in the aortic position. Echo findings are consistent with normal structure and function of the aortic valve prosthesis. Echo findings are consistent with Trivial perivalvular leak of the aortic prosthesis. Aortic regurgitation PHT measures 647 msec.  Aortic valve area, by VTI measures 2.36 cm. Aortic valve mean gradient measures 11.0 mmHg. Aortic valve Vmax measures 2.28 m/s. 7. The inferior vena cava is normal in size with greater than 50% respiratory variability, suggesting right atrial pressure of 3 mmHg.  FINDINGS Left Ventricle: Left ventricular ejection fraction, by estimation, is 35 to 40%. Left ventricular ejection fraction by 3D volume is 38 %. The left ventricle has moderately decreased function. The left ventricle demonstrates regional wall motion abnormalities. The average left ventricular global longitudinal strain is -13.9 %. Strain was performed and the global longitudinal strain is abnormal. The left ventricular internal cavity size was normal in size. There is mild asymmetric left ventricular hypertrophy of the basal-septal segment. Left ventricular diastolic parameters are consistent with Grade I diastolic dysfunction (impaired relaxation).   LV Wall Scoring: The posterior wall is akinetic. The antero-lateral wall, entire inferior wall, and  apical lateral segment are hypokinetic.  Right Ventricle: The right ventricular size is normal. No increase in right ventricular wall thickness. Right ventricular systolic function is normal.  Left Atrium: Left atrial size was moderately dilated.  Right Atrium: Right atrial size was mildly dilated.  Pericardium: Trivial pericardial effusion is present. The pericardial effusion is posterior to the left ventricle.  Mitral Valve: The mitral valve is degenerative in appearance. There is moderate thickening of the mitral valve leaflet(s). Mild mitral valve regurgitation. No evidence of mitral valve stenosis. MV peak gradient, 7.4 mmHg. The mean mitral valve gradient is 2.0 mmHg.  Tricuspid Valve: The tricuspid valve is normal in structure. Tricuspid valve regurgitation is trivial. No evidence of tricuspid stenosis.  Aortic Valve: The aortic valve has been repaired/replaced. Aortic  valve regurgitation is trivial. Aortic regurgitation PHT measures 647 msec. No aortic stenosis is present. Aortic valve mean gradient measures 11.0 mmHg. Aortic valve peak gradient measures 20.8 mmHg. Aortic valve area, by VTI measures 2.36 cm. There is a 23 mm Sapien prosthetic, stented (TAVR) valve present in the aortic position. Echo findings are consistent with normal structure and function of the aortic valve prosthesis.  Pulmonic Valve: The pulmonic valve was normal in structure. Pulmonic valve regurgitation is not visualized. No evidence of pulmonic stenosis.  Aorta: The aortic root is normal in size and structure.  Venous: The inferior vena cava is normal in size with greater than 50% respiratory variability, suggesting right atrial pressure of 3 mmHg.  IAS/Shunts: No atrial level shunt detected by color flow Doppler.  Additional Comments: 3D was performed not requiring image post processing on an independent workstation and was abnormal.   LEFT VENTRICLE PLAX 2D LVIDd:         5.00 cm         Diastology LVIDs:         4.00 cm         LV e' medial:    3.44 cm/s LV PW:         1.00 cm         LV E/e' medial:  22.4 LV IVS:        1.20 cm         LV e' lateral:   4.11 cm/s LVOT diam:     2.40 cm         LV E/e' lateral: 18.8 LV SV:         125 LV SV Index:   69              2D Longitudinal LVOT Area:     4.52 cm        Strain 2D Strain GLS   -13.1 % (A4C): 2D Strain GLS   -14.5 % (A3C): 2D Strain GLS   -14.2 % (A2C): 2D Strain GLS   -13.9 % Avg:  3D Volume EF LV 3D EF:    Left ventricul ar ejection fraction by 3D volume is 38 %.  3D Volume EF: 3D EF:        38 % LV EDV:       229 ml LV ESV:       142 ml LV SV:        88 ml  RIGHT VENTRICLE             IVC RV Basal diam:  4.00 cm     IVC diam: 1.00 cm RV S prime:     11.60 cm/s TAPSE (M-mode): 2.2 cm  LEFT  ATRIUM             Index        RIGHT ATRIUM           Index LA diam:        4.00 cm 2.22 cm/m   RA  Area:     17.10 cm LA Vol (A2C):   74.4 ml 41.29 ml/m  RA Volume:   48.70 ml  27.02 ml/m LA Vol (A4C):   73.0 ml 40.51 ml/m LA Biplane Vol: 75.0 ml 41.62 ml/m AORTIC VALVE AV Area (Vmax):    2.50 cm AV Area (Vmean):   2.71 cm AV Area (VTI):     2.36 cm AV Vmax:           228.00 cm/s AV Vmean:          151.500 cm/s AV VTI:            0.528 m AV Peak Grad:      20.8 mmHg AV Mean Grad:      11.0 mmHg LVOT Vmax:         126.00 cm/s LVOT Vmean:        90.800 cm/s LVOT VTI:          0.276 m LVOT/AV VTI ratio: 0.52 AI PHT:            647 msec  AORTA Ao Asc diam: 3.50 cm  MITRAL VALVE MV Area (PHT): 2.00 cm     SHUNTS MV Area VTI:   2.36 cm     Systemic VTI:  0.28 m MV Peak grad:  7.4 mmHg     Systemic Diam: 2.40 cm MV Mean grad:  2.0 mmHg MV Vmax:       1.36 m/s MV Vmean:      59.5 cm/s MV Decel Time: 379 msec MV E velocity: 77.20 cm/s MV A velocity: 112.00 cm/s MV E/A ratio:  0.69  Toribio Fuel MD Electronically signed by Toribio Fuel MD Signature Date/Time: 11/15/2024/8:37:56 PM    Final    MONITORS  LONG TERM MONITOR-LIVE TELEMETRY (3-14 DAYS) 10/25/2024  Narrative Patch Wear Time:  11 days and 19 hours (2025-12-03T14:58:32-0500 to 2025-12-15T10:22:49-0500)  Patient had a min HR of 45 bpm, max HR of 169 bpm, and avg HR of 65 bpm. Predominant underlying rhythm was Sinus Rhythm. First Degree AV Block was present. Bundle Branch Block/IVCD was present. 2 Ventricular Tachycardia runs occurred, the run with the fastest interval lasting 17 beats with a max rate of 169 bpm (avg 154 bpm); the run with the fastest interval was also the longest. 12 Supraventricular Tachycardia runs occurred, the run with the fastest interval lasting 4 beats with a max rate of 162 bpm, the longest lasting 18 beats with an avg rate of 128 bpm. Isolated SVEs were rare (<1.0%), SVE Couplets were rare (<1.0%), and SVE Triplets were rare (<1.0%). Isolated VEs were occasional (1.2%,  13032), VE Couplets were rare (<1.0%, 72), and VE Triplets were rare (<1.0%, 1). Ventricular Bigeminy and Trigeminy were present.  Summary: The basic rhythm is normal sinus with an average heart rate of 65 bpm there is a ventricular run of 17 beats and there are short supraventricular runs.  Total PVC burden is low at 1.2%.  There is no high-grade AV block noted.  There are periods of marked sinus bradycardia as low as 45 bpm.   CT SCANS  CT CORONARY MORPH W/CTA COR W/SCORE 09/23/2024  Addendum 09/30/2024 12:19 AM ADDENDUM REPORT: 09/30/2024  00:17  EXAM: OVER-READ INTERPRETATION  CT CHEST  The following report is an over-read performed by radiologist Dr. Oneil Devonshire of [facility] on 09/30/2024. This over-read does not include interpretation of cardiac or coronary anatomy or pathology. The coronary calcium  score/coronary CTA interpretation by the cardiologist is attached.  COMPARISON:  None.  FINDINGS: Cardiovascular: Atherosclerotic calcifications of the thoracic aorta are noted. No aneurysmal dilatation is seen. Pulmonary artery is within normal limits.  Mediastinum/Nodes: There are no enlarged lymph nodes within the visualized mediastinum.  Lungs/Pleura: Bilateral large pleural effusions are noted left greater than right. Underlying atelectasis in the left lower lobe is noted. 5 mm nodule is noted in the subpleural space in the right middle lobe best seen on image 29 of series 3 of 7.  Upper abdomen: No significant findings in the visualized upper abdomen.  Musculoskeletal/Chest wall: No chest wall mass or suspicious osseous findings within the visualized chest.  IMPRESSION: Aortic Atherosclerosis (ICD10-I70.0).  Bilateral large pleural effusions left greater than right.  5 mm nodule in the right middle lobe as described. No follow-up is recommended.   Electronically Signed By: Oneil Devonshire M.D. On: 09/30/2024 00:17  Narrative CLINICAL DATA:   Severe Aortic Stenosis.  EXAM: Cardiac TAVR CT  TECHNIQUE: A non-contrast, gated CT scan was obtained with axial slices of 2.5 mm through the heart for aortic valve scoring. A 80 kV retrospective, gated, contrast cardiac scan was obtained. Gantry rotation speed was 230 msec and collimation was 0.63 mm. Nitroglycerin  was not given. The 3D dataset was reconstructed in systole with motion correction. The 3D data set was reconstructed in 5% intervals of the 0-95% of the R-R cycle. Systolic and diastolic phases were analyzed on a dedicated workstation using MPR, MIP, and VRT modes. The patient received 100 cc of contrast.  FINDINGS: Image quality: Excellent.  Noise artifact is: Limited.  Valve Morphology: Tricuspid aortic valve with severely calcified leaflets and severely restricted leaflet motion in systole.  Aortic Valve Calcium  score: 3155  Aortic annular dimension:  Phase assessed: 25%  Annular area: 429 mm2  Annular perimeter: 75.0 mm  Max diameter: 26.8 mm  Min diameter: 20.8 mm  Annular and subannular calcification: Single, moderate calcification under the LCC extending into the LVOT.  Membranous septum length: 7.5 mm  Optimal coplanar projection: RAO 3 CAU 25  Coronary Artery Height above Annulus:  Left Main: 14.9 mm  Right Coronary: 23.3 mm  Sinus of Valsalva Measurements:  Non-coronary: 31 mm  Right-coronary: 31 mm  Left-coronary: 31 mm  Sinus of Valsalva Height:  Non-coronary: 27.3 mm  Right-coronary: 29.2 mm  Left-coronary: 23.4 mm  Sinotubular Junction: 30 mm  Ascending Thoracic Aorta: 35 mm  Coronary Arteries: Normal coronary origin. Right dominance. The study was performed without use of NTG and is insufficient for plaque evaluation. Please refer to recent cardiac catheterization for coronary assessment. Severe coronary calcifications.  Cardiac Morphology:  Right Atrium: Right atrial size is within normal limits.  Contrast reflux into the IVC consistent with elevated RA pressure.  Right Ventricle: The right ventricular cavity is within normal limits.  Left Atrium: Left atrial size is normal in size with no left atrial appendage filling defect. Small PFO.  Left Ventricle: The ventricular cavity size is within normal limits.  Pulmonary arteries: Dilated pulmonary artery suggestive of pulmonary hypertension.  Pulmonary veins: Normal pulmonary venous drainage.  Pericardium: Normal thickness with no significant effusion or calcium  present.  Mitral Valve: The mitral valve is normal structure without significant  calcification.  Extra-cardiac findings: Moderate bilateral pleural effusions. See attached radiology report for non-cardiac structures.  IMPRESSION: 1. Annular measurements support a 23 mm S3 or 26 mm Evolut Pro.  2. Single, moderate calcification under the LCC extending into the LVOT.  3. Sufficient coronary to annulus distance.  4. Optimal Fluoroscopic Angle for Delivery: RAO 3 CAU 25  5. Moderate bilateral pleural effusions.  6. Dilated pulmonary artery suggestive of pulmonary hypertension.  Darryle T. Barbaraann, MD  Electronically Signed: By: Darryle Barbaraann M.D. On: 09/23/2024 13:32     ______________________________________________________________________________________________      EKG:        Recent Labs: 06/26/2024: TSH 2.483 10/04/2024: B Natriuretic Peptide 1,552.5 10/06/2024: Magnesium  1.9 10/13/2024: ALT 24; BUN 27; BUN 27; Creatinine, Ser 1.56; Creatinine, Ser 1.56; Hemoglobin 13.7; Platelets 197.0; Potassium 4.4; Potassium 4.4; Pro B Natriuretic peptide (BNP) 596.0; Sodium 135; Sodium 135  Recent Lipid Panel    Component Value Date/Time   CHOL 134 09/08/2024 0323   CHOL 135 11/20/2021 1152   TRIG 93 09/08/2024 0323   HDL 39 (L) 09/08/2024 0323   HDL 29 (L) 11/20/2021 1152   CHOLHDL 3.4 09/08/2024 0323   VLDL 19 09/08/2024 0323   LDLCALC 76 09/08/2024  0323   LDLCALC 78 11/20/2021 1152   LDLDIRECT 66.0 07/06/2024 1449     Risk Assessment/Calculations:                Physical Exam:    VS:  BP (!) 108/40 (BP Location: Left Arm, Patient Position: Sitting, Cuff Size: Normal)   Pulse 67   Ht 5' 9 (1.753 m)   Wt 145 lb 9.6 oz (66 kg)   SpO2 98%   BMI 21.50 kg/m     Wt Readings from Last 3 Encounters:  11/17/24 145 lb 9.6 oz (66 kg)  10/25/24 145 lb (65.8 kg)  10/21/24 140 lb (63.5 kg)     GEN:  Well nourished, well developed elderly male in no acute distress HEENT: Normal NECK: No JVD; No carotid bruits LYMPHATICS: No lymphadenopathy CARDIAC: RRR, no murmurs, rubs, gallops RESPIRATORY:  Clear to auscultation without rales, wheezing or rhonchi  ABDOMEN: Soft, non-tender, non-distended MUSCULOSKELETAL:  No edema; No deformity  SKIN: Warm and dry NEUROLOGIC:  Alert and oriented x 3 PSYCHIATRIC:  Normal affect   Assessment & Plan S/P TAVR (transcatheter aortic valve replacement) Recent echo reviewed for his 30-day follow-up study and this demonstrates moderate LV dysfunction with normal function of his TAVR prosthesis, mean gradient 11 mmHg and trivial paravalvular regurgitation.  Plan 1 year follow-up echo per protocol. Chronic HFrEF (heart failure with reduced ejection fraction) (HCC) Clinically stable.  No signs of volume overload.  Patient reports symptoms consistent with NYHA functional class I status.  Will stop furosemide  and potassium.  He weighs himself every day.  We are writing instructions for him to restart furosemide  and potassium if he gains 3 pounds overnight or 5 pounds from baseline at any time.  Otherwise he will continue on losartan .  He is not a candidate for beta-blockers due to risk of heart block with his conduction disease.  I do not think he is a good candidate for aggressive GDMT at his age of 89. Coronary artery disease involving native coronary artery of native heart without angina pectoris Status  post left main stenting.  Continue aspirin  and clopidogrel .            Medication Adjustments/Labs and Tests Ordered: Current medicines are reviewed at length with  the patient today.  Concerns regarding medicines are outlined above.  No orders of the defined types were placed in this encounter.  No orders of the defined types were placed in this encounter.   Patient Instructions  Medication Instructions:  STOP Furosemide  (Lasix )   STOP Potassium   IF YOU HAVE 3 lbs weight gain overnight or 5 lbs weight gain in a week, restart your Lasix  and potassium and let our office know.  *If you need a refill on your cardiac medications before your next appointment, please call your pharmacy*  Lab Work: None ordered today. If you have labs (blood work) drawn today and your tests are completely normal, you will receive your results only by: MyChart Message (if you have MyChart) OR A paper copy in the mail If you have any lab test that is abnormal or we need to change your treatment, we will call you to review the results.  Testing/Procedures: None ordered today.  Follow-Up: At [facility], you and your health needs are our priority.  As part of our continuing mission to provide you with exceptional heart care, our providers are all part of one team.  This team includes your primary Cardiologist (physician) and Advanced Practice Providers or APPs (Physician Assistants and Nurse Practitioners) who all work together to provide you with the care you need, when you need it.  Your next appointment:   3 month(s)  Provider:   One of our Advanced Practice Providers (APPs): Morse Clause, PA-C  Lamarr Satterfield, NP Miriam Shams, NP  Olivia Pavy, PA-C Josefa Beauvais, NP  Leontine Salen, PA-C Orren Fabry, PA-C  [place], PA-C Ernest Dick, NP  Damien Braver, NP Jon Hails, PA-C  Waddell Donath, PA-C    Dayna Dunn, PA-C  Scott Weaver, PA-C [place], NP Katlyn West,  NP Callie Goodrich, PA-C  Xika Zhao, NP Sheng Haley, PA-C    Kathleen Johnson, PA-C    Signed, [name], MD  11/17/2024 10:51 AM    [facility]     [1]  Current Meds  Medication Sig   acetaminophen  (TYLENOL ) 500 MG tablet Take 1,000 mg by mouth every 6 (six) hours as needed for mild pain (pain score 1-3) or headache.   aspirin  EC 81 MG tablet Take 1 tablet (81 mg total) by mouth daily. Swallow whole.   atorvastatin  (LIPITOR) 40 MG tablet Take 1 tablet (40 mg total) by mouth daily.   clopidogrel  (PLAVIX ) 75 MG tablet Take 1 tablet (75 mg total) by mouth daily.   fish oil-omega-3 fatty acids  1000 MG capsule Take 1 g by mouth daily.   levothyroxine  (SYNTHROID ) 88 MCG tablet Take 1 tablet (88 mcg total) by mouth daily before breakfast.   losartan  (COZAAR ) 25 MG tablet Take 1 tablet (25 mg total) by mouth daily.   Multiple Vitamins-Minerals (CENTRUM SILVER MEN 50+) TABS Take 1 tablet by mouth in the morning.   [DISCONTINUED] furosemide  (LASIX ) 20 MG tablet Take 1 tablet (20 mg total) by mouth daily.   [DISCONTINUED] potassium chloride  SA (KLOR-CON  M) 20 MEQ tablet Take 1 tablet (20 mEq total) by mouth daily.   "

## 2024-11-21 DIAGNOSIS — Z952 Presence of prosthetic heart valve: Secondary | ICD-10-CM | POA: Diagnosis not present

## 2024-11-21 DIAGNOSIS — R001 Bradycardia, unspecified: Secondary | ICD-10-CM | POA: Diagnosis not present

## 2024-11-22 ENCOUNTER — Ambulatory Visit: Payer: Self-pay | Admitting: Cardiovascular Disease

## 2024-11-23 ENCOUNTER — Ambulatory Visit (HOSPITAL_COMMUNITY)
Admission: RE | Admit: 2024-11-23 | Discharge: 2024-11-23 | Disposition: A | Discharge status: Home / Self Care | Source: Ambulatory Visit | Attending: Cardiovascular Disease | Admitting: Cardiovascular Disease

## 2024-11-23 DIAGNOSIS — I6523 Occlusion and stenosis of bilateral carotid arteries: Secondary | ICD-10-CM | POA: Insufficient documentation

## 2024-11-25 ENCOUNTER — Ambulatory Visit: Payer: Self-pay | Admitting: Cardiology

## 2024-12-06 ENCOUNTER — Ambulatory Visit: Payer: Medicare Other

## 2024-12-06 VITALS — Ht 69.0 in | Wt 140.0 lb

## 2024-12-06 DIAGNOSIS — Z0000 Encounter for general adult medical examination without abnormal findings: Secondary | ICD-10-CM

## 2024-12-06 NOTE — Progress Notes (Signed)
 "  Chief Complaint  Patient presents with   Medicare Wellness     Subjective:   [name] is a 89 y.o. male who presents for a Medicare Annual Wellness Visit.  Visit info / Clinical Intake: Medicare Wellness Visit Type:: Subsequent Annual Wellness Visit Persons participating in visit and providing information:: patient Medicare Wellness Visit Mode:: Video Since this visit was completed virtually, some vitals may be partially provided or unavailable. Missing vitals are due to the limitations of the virtual format.: Documented vitals are patient reported If Telephone or Video please confirm:: I connected with patient using audio/video enable telemedicine. I verified patient identity with two identifiers, discussed telehealth limitations, and patient agreed to proceed. Patient Location:: home Provider Location:: home office Interpreter Needed?: No Pre-visit prep was completed: yes AWV questionnaire completed by patient prior to visit?: no Living arrangements:: (!) lives alone Patient's Overall Health Status Rating: excellent Typical amount of pain: none Does pain affect daily life?: no Are you currently prescribed opioids?: no  Dietary Habits and Nutritional Risks How many meals a day?: 3 Eats fruit and vegetables daily?: yes Most meals are obtained by: preparing own meals In the last 2 weeks, have you had any of the following?: none Diabetic:: (!) yes Any non-healing wounds?: no How often do you check your BS?: 0 Would you like to be referred to a Nutritionist or for Diabetic Management? : no  Functional Status Activities of Daily Living (to include ambulation/medication): Independent Ambulation: Independent Medication Administration: Independent Home Management (perform basic housework or laundry): Independent Manage your own finances?: yes Primary transportation is: driving Concerns about vision?: no *vision screening is required for WTM* Concerns about hearing?:  no  Fall Screening Falls in the past year?: 0 Number of falls in past year: 0 Was there an injury with Fall?: 0 Fall Risk Category Calculator: 0 Patient Fall Risk Level: Low Fall Risk  Fall Risk Patient at Risk for Falls Due to: Medication side effect Fall risk Follow up: Falls prevention discussed; Education provided; Falls evaluation completed  Home and Transportation Safety: All rugs have non-skid backing?: N/A, no rugs All stairs or steps have railings?: yes Grab bars in the bathtub or shower?: yes Have non-skid surface in bathtub or shower?: yes Good home lighting?: yes Regular seat belt use?: yes Hospital stays in the last year:: (!) yes How many hospital stays:: 5 Reason: AKI, SOB, CAD, CHF, TAVR  Cognitive Assessment Difficulty concentrating, remembering, or making decisions? : no Will 6CIT or Mini Cog be Completed: yes What year is it?: 0 points What month is it?: 0 points Give patient an address phrase to remember (5 components): [address] [place] About what time is it?: 0 points Count backwards from 20 to 1: 0 points Say the months of the year in reverse: 0 points Repeat the address phrase from earlier: 6 points 6 CIT Score: 6 points  Advance Directives (For Healthcare) Does Patient Have a Medical Advance Directive?: Yes Does patient want to make changes to medical advance directive?: No - Patient declined Type of Advance Directive: Healthcare Power of [place]; Living will Copy of Healthcare Power of Attorney in Chart?: No - copy requested Copy of Living Will in Chart?: No - copy requested Out of facility DNR (pink MOST or yellow form) in Chart? (Ambulatory ONLY): No - copy requested Would patient like information on creating a medical advance directive?: No - Patient declined  Reviewed/Updated  Reviewed/Updated: Reviewed All (Medical, Surgical, Family, Medications, Allergies, Care Teams,  Patient Goals)    Allergies (verified) Patient has no known  allergies.   Current Medications (verified) Outpatient Encounter Medications as of 12/06/2024  Medication Sig   acetaminophen  (TYLENOL ) 500 MG tablet Take 1,000 mg by mouth every 6 (six) hours as needed for mild pain (pain score 1-3) or headache.   amiodarone  (PACERONE ) 200 MG tablet Take 1 tablet (200 mg total) by mouth daily.   aspirin  EC 81 MG tablet Take 1 tablet (81 mg total) by mouth daily. Swallow whole.   atorvastatin  (LIPITOR) 40 MG tablet Take 1 tablet (40 mg total) by mouth daily.   clopidogrel  (PLAVIX ) 75 MG tablet Take 1 tablet (75 mg total) by mouth daily.   fish oil-omega-3 fatty acids  1000 MG capsule Take 1 g by mouth daily.   levothyroxine  (SYNTHROID ) 88 MCG tablet Take 1 tablet (88 mcg total) by mouth daily before breakfast.   losartan  (COZAAR ) 25 MG tablet Take 1 tablet (25 mg total) by mouth daily.   Multiple Vitamins-Minerals (CENTRUM SILVER MEN 50+) TABS Take 1 tablet by mouth in the morning.   azelastine  (ASTELIN ) 0.1 % nasal spray Place 1 spray into both nostrils 2 (two) times daily. Use in each nostril as directed (Patient not taking: Reported on 12/06/2024)   No facility-administered encounter medications on file as of 12/06/2024.    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 Wonda and Dr. Daniel   Thyroid  disease    hypothyroidism   Past Surgical History:  Procedure Laterality Date   APPENDECTOMY     CARDIAC CATHETERIZATION     2007 occluded ramus inter. , PCI to RCA   CARPAL TUNNEL RELEASE     CORONARY ATHERECTOMY N/A 09/15/2024   Procedure: CORONARY ATHERECTOMY;  Surgeon: Darron Deatrice LABOR, MD;  Location: [facility];  Service: Cardiovascular;   Laterality: N/A;   CORONARY IMAGING/OCT N/A 09/15/2024   Procedure: CORONARY IMAGING/OCT;  Surgeon: Darron Deatrice LABOR, MD;  Location: [facility];  Service: Cardiovascular;  Laterality: N/A;   CORONARY STENT INTERVENTION N/A 09/15/2024   Procedure: CORONARY STENT INTERVENTION;  Surgeon: Darron Deatrice LABOR, MD;  Location: [facility];  Service: Cardiovascular;  Laterality: N/A;   DECOMPRESSIVE LUMBAR LAMINECTOMY LEVEL 2 N/A 12/20/2022   Procedure: LUMBAR THREE THROUGH FIVE LAMINECTOMIES AND FORAMINOTOMIES;  Surgeon: Georgina Ozell LABOR, MD;  Location: [facility] OR;  Service: Orthopedics;  Laterality: N/A;   EYE SURGERY Bilateral    cataract surgery   IABP INSERTION N/A 09/15/2024   Procedure: IABP Insertion;  Surgeon: Darron Deatrice LABOR, MD;  Location: [facility];  Service: Cardiovascular;  Laterality: N/A;   INTRAOPERATIVE TRANSTHORACIC ECHOCARDIOGRAM N/A 10/05/2024   Procedure: ECHOCARDIOGRAM, TRANSTHORACIC;  Surgeon: Wonda Ozell, MD;  Location[facility] INVASIVE CV LAB;  Service: Cardiovascular;  Laterality: N/A;   LUMBAR LAMINECTOMY/DECOMPRESSION MICRODISCECTOMY N/A 12/22/2022   Procedure: POSTERIOR LUMBAR DURAL REPAIR;  Surgeon: Georgina Ozell LABOR, MD;  Location: [facility] OR;  Service: Orthopedics;  Laterality: N/A;   RIGHT HEART CATH N/A 09/28/2024   Procedure: RIGHT HEART CATH;  Surgeon: Cherrie Toribio SAUNDERS, MD;  Location: [facility];  Service:  Cardiovascular;  Laterality: N/A;   RIGHT HEART CATH N/A 10/04/2024   Procedure: RIGHT HEART CATH;  Surgeon: Zenaida Morene PARAS, MD;  Location: [facility] INVASIVE CV LAB;  Service: Cardiovascular;  Laterality: N/A;   RIGHT HEART CATH AND CORONARY ANGIOGRAPHY N/A 09/09/2024   Procedure: RIGHT HEART CATH AND CORONARY ANGIOGRAPHY;  Surgeon: Mady Bruckner, MD;  Location: [facility];  Service: Cardiovascular;  Laterality: N/A;   TONSILLECTOMY     Family History  Problem Relation Age of Onset   Hypertension Mother    Hypertension Father     Allergic rhinitis Neg Hx    Angioedema Neg Hx    Asthma Neg Hx    Eczema Neg Hx    Immunodeficiency Neg Hx    Urticaria Neg Hx    Social History   Occupational History   Occupation: reired  Tobacco Use   Smoking status: Former    Current packs/day: 0.00    Types: Cigarettes    Quit date: 11/04/1960    Years since quitting: 64.1   Smokeless tobacco: Never  Vaping Use   Vaping status: Never Used  Substance and Sexual Activity   Alcohol use: No   Drug use: No   Sexual activity: Not on file   Tobacco Counseling Counseling given: Not Answered  SDOH Screenings   Food Insecurity: No Food Insecurity (12/06/2024)  Housing: Unknown (12/06/2024)  Transportation Needs: No Transportation Needs (12/06/2024)  Utilities: Not At Risk (12/06/2024)  Alcohol Screen: Low Risk (12/06/2024)  Depression (PHQ2-9): Low Risk (12/06/2024)  Financial Resource Strain: Low Risk (12/06/2024)  Physical Activity: Sufficiently Active (12/06/2024)  Social Connections: Moderately Isolated (12/06/2024)  Stress: No Stress Concern Present (12/06/2024)  Tobacco Use: Medium Risk (12/06/2024)  Health Literacy: Adequate Health Literacy (12/06/2024)   See flowsheets for full screening details  Depression Screen PHQ 2 & 9 Depression Scale- Over the past 2 weeks, how often have you been bothered by any of the following problems? Little interest or pleasure in doing things: 0 Feeling down, depressed, or hopeless (PHQ Adolescent also includes...irritable): 0 PHQ-2 Total Score: 0 Trouble falling or staying asleep, or sleeping too much: 0 Feeling tired or having little energy: 0 Poor appetite or overeating (PHQ Adolescent also includes...weight loss): 0 Feeling bad about yourself - or that you are a failure or have let yourself or your family down: 0 Trouble concentrating on things, such as reading the newspaper or watching television (PHQ Adolescent also includes...like school work): 0 Moving or speaking so slowly that other people  could have noticed. Or the opposite - being so fidgety or restless that you have been moving around a lot more than usual: 0 Thoughts that you would be better off dead, or of hurting yourself in some way: 0 PHQ-9 Total Score: 0 If you checked off any problems, how difficult have these problems made it for you to do your work, take care of things at home, or get along with other people?: Not difficult at all     Goals Addressed               This Visit's Progress     wants to gain weight (pt-stated)               Objective:    Today's Vitals   12/06/24 1509  Weight: 140 lb (63.5 kg)  Height: 5' 9 (1.753 m)   Body mass index is 20.67 kg/m.  Hearing/Vision screen Vision Screening - Comments:: Regular eye exams Immunizations and Health  Maintenance Health Maintenance  Topic Date Due   COVID-19 Vaccine (3 - Pfizer risk series) 03/27/2020   Zoster Vaccines- Shingrix (2 of 2) 10/18/2020   OPHTHALMOLOGY EXAM  04/09/2024   HEMOGLOBIN A1C  04/05/2025   FOOT EXAM  07/06/2025   Medicare Annual Wellness (AWV)  12/06/2025   DTaP/Tdap/Td (2 - Td or Tdap) 02/23/2031   Pneumococcal Vaccine: 50+ Years  Completed   Influenza Vaccine  Completed   Meningococcal B Vaccine  Aged Out        Assessment/Plan:  This is a routine wellness examination for [place].  Patient Care Team: Nche, [name], NP as PCP - General (Internal Medicine) Wonda Sharper, MD as PCP - Structural Heart (Cardiology) Wonda Sharper, MD as PCP - Cardiology (Cardiology) Cleotilde Elspeth CROME, OD (Optometry)  I have personally reviewed and noted the following in the patients chart:   Medical and social history Use of alcohol, tobacco or illicit drugs  Current medications and supplements including opioid prescriptions. Functional ability and status Nutritional status Physical activity Advanced directives List of other physicians Hospitalizations, surgeries, and ER visits in previous 12  months Vitals Screenings to include cognitive, depression, and falls Referrals and appointments  No orders of the defined types were placed in this encounter.  In addition, I have reviewed and discussed with patient certain preventive protocols, quality metrics, and best practice recommendations. A written personalized care plan for preventive services as well as general preventive health recommendations were provided to patient.   Ardella FORBES Dawn, LPN   05/10/7972   Return in 1 year (on 12/06/2025).  After Visit Summary: (MyChart) Due to this being a telephonic visit, the after visit summary with patients personalized plan was offered to patient via MyChart   Nurse Notes: HM Addressed: Vaccines Due: second shingles and covid Requested eye exam  "

## 2024-12-06 NOTE — Patient Instructions (Signed)
 [name],  Thank you for taking the time for your Medicare Wellness Visit. I appreciate your continued commitment to your health goals. Please review the care plan we discussed, and feel free to reach out if I can assist you further.  Please note that Annual Wellness Visits do not include a physical exam. Some assessments may be limited, especially if the visit was conducted virtually. If needed, we may recommend an in-person follow-up with your provider.  Ongoing Care Seeing your primary care provider every 3 to 6 months helps us  monitor your health and provide consistent, personalized care.   Referrals If a referral was made during today's visit and you haven't received any updates within two weeks, please contact the referred provider directly to check on the status.  Recommended Screenings:  Health Maintenance  Topic Date Due   COVID-19 Vaccine (3 - Pfizer risk series) 03/27/2020   Zoster (Shingles) Vaccine (2 of 2) 10/18/2020   Eye exam for diabetics  04/09/2024   Medicare Annual Wellness Visit  11/30/2024   Hemoglobin A1C  04/05/2025   Complete foot exam   07/06/2025   DTaP/Tdap/Td vaccine (2 - Td or Tdap) 02/23/2031   Pneumococcal Vaccine for age over 70  Completed   Flu Shot  Completed   Meningitis B Vaccine  Aged Out       12/06/2024    3:19 PM  Advanced Directives  Does Patient Have a Medical Advance Directive? Yes  Type of Estate Agent of [place];Living will  Copy of Healthcare Power of Attorney in Chart? No - copy requested    Vision: Annual vision screenings are recommended for early detection of glaucoma, cataracts, and diabetic retinopathy. These exams can also reveal signs of chronic conditions such as diabetes and high blood pressure.  Dental: Annual dental screenings help detect early signs of oral cancer, gum disease, and other conditions linked to overall health, including heart disease and diabetes.  Please see the attached documents  for additional preventive care recommendations.

## 2025-01-06 ENCOUNTER — Ambulatory Visit: Admitting: Nurse Practitioner

## 2025-02-16 ENCOUNTER — Ambulatory Visit: Admitting: General Practice

## 2025-12-12 ENCOUNTER — Ambulatory Visit
